# Patient Record
Sex: Female | Born: 1950
Health system: Southern US, Community
[De-identification: ages and names within clinical notes are randomized; demographics above are authoritative.]

## PROBLEM LIST (undated history)

## (undated) DIAGNOSIS — C9110 Chronic lymphocytic leukemia of B-cell type not having achieved remission: Secondary | ICD-10-CM

## (undated) DIAGNOSIS — K219 Gastro-esophageal reflux disease without esophagitis: Secondary | ICD-10-CM

## (undated) DIAGNOSIS — M1990 Unspecified osteoarthritis, unspecified site: Secondary | ICD-10-CM

## (undated) DIAGNOSIS — D0510 Intraductal carcinoma in situ of unspecified breast: Secondary | ICD-10-CM

## (undated) DIAGNOSIS — E871 Hypo-osmolality and hyponatremia: Secondary | ICD-10-CM

## (undated) HISTORY — DX: Intraductal carcinoma in situ of unspecified breast: D05.10

## (undated) HISTORY — DX: Unspecified osteoarthritis, unspecified site: M19.90

## (undated) HISTORY — PX: OTHER SURGICAL HISTORY: SHX169

## (undated) HISTORY — DX: Gastro-esophageal reflux disease without esophagitis: K21.9

## (undated) HISTORY — DX: Chronic lymphocytic leukemia of B-cell type not having achieved remission: C91.10

---

## 1994-05-19 HISTORY — PX: CHOLECYSTECTOMY: SHX55

## 2009-05-19 DIAGNOSIS — C9110 Chronic lymphocytic leukemia of B-cell type not having achieved remission: Secondary | ICD-10-CM

## 2009-05-19 HISTORY — DX: Chronic lymphocytic leukemia of B-cell type not having achieved remission: C91.10

## 2010-03-11 ENCOUNTER — Ambulatory Visit: Payer: Self-pay | Admitting: Sports Medicine

## 2010-03-19 ENCOUNTER — Ambulatory Visit: Payer: Self-pay | Admitting: Family Medicine

## 2010-04-18 ENCOUNTER — Ambulatory Visit: Payer: Self-pay | Admitting: Internal Medicine

## 2010-04-18 DIAGNOSIS — C9110 Chronic lymphocytic leukemia of B-cell type not having achieved remission: Secondary | ICD-10-CM | POA: Insufficient documentation

## 2010-05-19 ENCOUNTER — Ambulatory Visit: Payer: Self-pay | Admitting: Internal Medicine

## 2010-06-19 ENCOUNTER — Ambulatory Visit: Payer: Self-pay | Admitting: Internal Medicine

## 2010-07-19 ENCOUNTER — Ambulatory Visit: Payer: Self-pay | Admitting: Oncology

## 2010-08-18 ENCOUNTER — Ambulatory Visit: Payer: Self-pay | Admitting: Oncology

## 2010-10-11 ENCOUNTER — Ambulatory Visit: Payer: Self-pay | Admitting: Internal Medicine

## 2010-10-18 ENCOUNTER — Ambulatory Visit: Payer: Self-pay | Admitting: Internal Medicine

## 2010-11-17 ENCOUNTER — Ambulatory Visit: Payer: Self-pay | Admitting: Internal Medicine

## 2010-12-20 ENCOUNTER — Ambulatory Visit: Payer: Self-pay | Admitting: Internal Medicine

## 2011-01-18 ENCOUNTER — Ambulatory Visit: Payer: Self-pay | Admitting: Internal Medicine

## 2011-02-17 ENCOUNTER — Ambulatory Visit: Payer: Self-pay | Admitting: Internal Medicine

## 2011-05-02 ENCOUNTER — Ambulatory Visit: Payer: Self-pay | Admitting: Internal Medicine

## 2011-05-20 ENCOUNTER — Ambulatory Visit: Payer: Self-pay | Admitting: Internal Medicine

## 2011-05-20 HISTORY — PX: COLONOSCOPY: SHX174

## 2011-08-01 ENCOUNTER — Ambulatory Visit: Payer: Self-pay | Admitting: Internal Medicine

## 2011-08-18 ENCOUNTER — Ambulatory Visit: Payer: Self-pay | Admitting: Internal Medicine

## 2011-10-07 ENCOUNTER — Ambulatory Visit: Payer: Self-pay

## 2011-10-08 ENCOUNTER — Ambulatory Visit: Payer: Self-pay

## 2011-10-09 ENCOUNTER — Ambulatory Visit: Payer: Self-pay | Admitting: Internal Medicine

## 2011-10-18 ENCOUNTER — Ambulatory Visit: Payer: Self-pay | Admitting: Internal Medicine

## 2011-11-11 ENCOUNTER — Ambulatory Visit: Payer: Self-pay | Admitting: Gastroenterology

## 2011-11-12 LAB — PATHOLOGY REPORT

## 2012-04-26 ENCOUNTER — Ambulatory Visit: Payer: Self-pay | Admitting: Family Medicine

## 2012-06-24 ENCOUNTER — Ambulatory Visit: Payer: Self-pay | Admitting: Internal Medicine

## 2012-07-17 ENCOUNTER — Ambulatory Visit: Payer: Self-pay | Admitting: Internal Medicine

## 2012-12-29 ENCOUNTER — Ambulatory Visit: Payer: Self-pay | Admitting: Internal Medicine

## 2013-04-06 ENCOUNTER — Ambulatory Visit: Payer: Self-pay | Admitting: Internal Medicine

## 2013-04-11 LAB — OCCULT BLOOD X 1 CARD TO LAB, STOOL
Occult Blood, Feces: NEGATIVE
Occult Blood, Feces: NEGATIVE

## 2013-04-18 ENCOUNTER — Ambulatory Visit: Payer: Self-pay | Admitting: Internal Medicine

## 2013-06-28 ENCOUNTER — Ambulatory Visit: Payer: Self-pay | Admitting: Internal Medicine

## 2013-07-17 ENCOUNTER — Ambulatory Visit: Payer: Self-pay | Admitting: Internal Medicine

## 2013-09-23 ENCOUNTER — Ambulatory Visit: Payer: Self-pay | Admitting: Internal Medicine

## 2013-09-29 ENCOUNTER — Encounter: Payer: Self-pay | Admitting: General Surgery

## 2013-09-29 ENCOUNTER — Ambulatory Visit (INDEPENDENT_AMBULATORY_CARE_PROVIDER_SITE_OTHER): Payer: 59 | Admitting: General Surgery

## 2013-09-29 VITALS — BP 142/80 | HR 84 | Resp 14 | Ht 65.0 in | Wt 215.0 lb

## 2013-09-29 DIAGNOSIS — I872 Venous insufficiency (chronic) (peripheral): Secondary | ICD-10-CM

## 2013-09-29 DIAGNOSIS — I8310 Varicose veins of unspecified lower extremity with inflammation: Secondary | ICD-10-CM

## 2013-09-29 NOTE — Progress Notes (Signed)
Patient ID: Joanna Phillips, female   DOB: July 21, 1950, 63 y.o.   MRN: [id number]  Chief Complaint  Patient presents with  . Other    cellulitis right lower leg    HPI Joanna Phillips is a 63 y.o. female who presents for an evaluation of right lower leg redness. The patient states this started in September 2014 and has gotten worse. Pt noted a red area with a "knot" after a long bus trip. Pt denies pain but reports a burning sensation that started less than 1 month ago with a new "knot". The red area increased in size gradually. No prior treatments has been attempted. PCP and Oncology physician suggested to see a surgeon if it became worse. The patient states the area feels better after massaging the area.    HPI  Past Medical History  Diagnosis Date  . CLL (chronic lymphocytic leukemia) 2011  . GERD (gastroesophageal reflux disease)   . Arthritis     Past Surgical History  Procedure Laterality Date  . Cholecystectomy  1996    Family History  Problem Relation Age of Onset  . Cancer Father     lung  . Diabetes Mother     Social History History  Substance Use Topics  . Smoking status: Never Smoker   . Smokeless tobacco: Never Used  . Alcohol Use: No    Allergies  Allergen Reactions  . Amoxicillin Hives    Current Outpatient Prescriptions  Medication Sig Dispense Refill  . albuterol (PROVENTIL) (2.5 MG/3ML) 0.083% nebulizer solution Take 2.5 mg by nebulization every 6 (six) hours as needed for wheezing or shortness of breath.      . dextromethorphan-guaiFENesin (MUCINEX DM) 30-600 MG per 12 hr tablet Take 1 tablet by mouth 2 (two) times daily as needed for cough.      . fexofenadine (ALLEGRA) 180 MG tablet Take 180 mg by mouth daily.      . fluticasone (FLONASE) 50 MCG/ACT nasal spray Place into both nostrils daily.      Marland Kitchen guaiFENesin (ROBITUSSIN) 100 MG/5ML liquid Take 200 mg by mouth 3 (three) times daily as needed for cough.      Marland Kitchen ibuprofen (ADVIL,MOTRIN) 800 MG tablet  Take 800 mg by mouth every 8 (eight) hours as needed.       Marland Kitchen omeprazole (PRILOSEC) 20 MG capsule Take 20 mg by mouth daily.        No current facility-administered medications for this visit.    Review of Systems Review of Systems  Constitutional: Negative.   Respiratory: Negative.   Cardiovascular: Negative.     Blood pressure 142/80, pulse 84, resp. rate 14, height 5\' 5"  (1.651 m), weight 215 lb (97.523 kg).  Physical Exam Physical Exam  Constitutional: She is oriented to person, place, and time. She appears well-developed and well-nourished.  Eyes: Conjunctivae are normal. No scleral icterus.  Neck: Neck supple. No thyromegaly present.  Cardiovascular: Normal rate, regular rhythm and normal heart sounds.   No murmur heard. Pulses:      Dorsalis pedis pulses are 1+ on the right side, and 1+ on the left side.       Posterior tibial pulses are 2+ on the right side, and 2+ on the left side.  +2 edema on right leg. 1 + on left side.    Pulmonary/Chest: Effort normal and breath sounds normal.  Lymphadenopathy:    She has no cervical adenopathy.  Neurological: She is alert and oriented to person, place, and time.  Skin: Skin  is warm and dry.  Area of thickened patchy erythematous of skin with browning discoloration  on the medial aspect of her right lower leg that measures 4.5 inches long x 2.5 inches wide. Capillary refill is less than 2 seconds.   Multiple visible spider veins lower extremities bilaterally and varicosities at calves.      Data Reviewed None  Assessment    Venous stasis dermatitis right leg. Varicose veins in right calf and minimal on left calf.     Plan    Unna boot with compression. Prescription given for compression stockings 30-40 mm Hg. Unna boot to be changed weekly. I will reassess in 3 weeks with venous duplex study.        Joanna Phillips G Joanna Phillips 09/29/2013, 4:15 PM

## 2013-09-29 NOTE — Patient Instructions (Signed)
Patient to return weekly to have unna boot changed. She is also to return in 3 weeks for follow up with Dr. Jamal Collin. The patient is aware to call back for any questions or concerns.

## 2013-10-06 ENCOUNTER — Ambulatory Visit: Payer: 59

## 2013-10-13 ENCOUNTER — Ambulatory Visit (INDEPENDENT_AMBULATORY_CARE_PROVIDER_SITE_OTHER): Payer: 59 | Admitting: *Deleted

## 2013-10-13 DIAGNOSIS — I872 Venous insufficiency (chronic) (peripheral): Secondary | ICD-10-CM

## 2013-10-13 DIAGNOSIS — I8310 Varicose veins of unspecified lower extremity with inflammation: Secondary | ICD-10-CM

## 2013-10-13 NOTE — Progress Notes (Signed)
Area much improved to right lower leg. Follow up with MD next week.

## 2013-10-13 NOTE — Patient Instructions (Signed)
One week

## 2013-10-17 ENCOUNTER — Ambulatory Visit: Payer: Self-pay | Admitting: Internal Medicine

## 2013-10-19 ENCOUNTER — Other Ambulatory Visit: Payer: 59

## 2013-10-19 ENCOUNTER — Encounter: Payer: Self-pay | Admitting: General Surgery

## 2013-10-19 ENCOUNTER — Ambulatory Visit (INDEPENDENT_AMBULATORY_CARE_PROVIDER_SITE_OTHER): Payer: 59 | Admitting: General Surgery

## 2013-10-19 VITALS — BP 124/72 | HR 76 | Resp 12 | Ht 65.0 in | Wt 214.0 lb

## 2013-10-19 DIAGNOSIS — I872 Venous insufficiency (chronic) (peripheral): Secondary | ICD-10-CM

## 2013-10-19 NOTE — Progress Notes (Signed)
Patient ID: Joanna Phillips, female   DOB: Dec 05, 1950, 63 y.o.   MRN: [id number]  Chief Complaint  Patient presents with  . Follow-up    3 week follow up Duplex Study    HPI Joanna Phillips is a 63 y.o. female who presents for a 3 week follow up Duplex Study. The patient denies any pain in her legs at this time. She was treated with Ardelia Mems boot and compression with noteable improvement of the stasis dermatitis in right leg  HPI  Past Medical History  Diagnosis Date  . CLL (chronic lymphocytic leukemia) 2011  . GERD (gastroesophageal reflux disease)   . Arthritis     Past Surgical History  Procedure Laterality Date  . Cholecystectomy  1996    Family History  Problem Relation Age of Onset  . Cancer Father     lung  . Diabetes Mother     Social History History  Substance Use Topics  . Smoking status: Never Smoker   . Smokeless tobacco: Never Used  . Alcohol Use: No    Allergies  Allergen Reactions  . Amoxicillin Hives    Current Outpatient Prescriptions  Medication Sig Dispense Refill  . albuterol (PROVENTIL) (2.5 MG/3ML) 0.083% nebulizer solution Take 2.5 mg by nebulization every 6 (six) hours as needed for wheezing or shortness of breath.      . dextromethorphan-guaiFENesin (MUCINEX DM) 30-600 MG per 12 hr tablet Take 1 tablet by mouth 2 (two) times daily as needed for cough.      . fexofenadine (ALLEGRA) 180 MG tablet Take 180 mg by mouth daily.      . fluticasone (FLONASE) 50 MCG/ACT nasal spray Place into both nostrils daily.      Marland Kitchen guaiFENesin (ROBITUSSIN) 100 MG/5ML liquid Take 200 mg by mouth 3 (three) times daily as needed for cough.      Marland Kitchen ibuprofen (ADVIL,MOTRIN) 800 MG tablet Take 800 mg by mouth every 8 (eight) hours as needed.       Marland Kitchen omeprazole (PRILOSEC) 20 MG capsule Take 20 mg by mouth daily.        No current facility-administered medications for this visit.    Review of Systems Review of Systems  Constitutional: Negative.   Respiratory: Negative.    Cardiovascular: Negative.     Blood pressure 124/72, pulse 76, resp. rate 12, height 5\' 5"  (1.651 m), weight 214 lb (97.07 kg).  Physical Exam Physical Exam Right leg inner aspect with brawny induration, much improved from 3 weeks ago. Mild edema both legs. Minimal skin induration above left ankle. Data Reviewed Venous Duplex study done today-no DVT, no reflux in femoral vein or saphenous veins.  Assessment    Stasis dermatitis legs     Plan    Pt advised to start using compression hose-she already has an Rx for this. Proper use of this explained to her.         Seeplaputhur G Sankar 10/19/2013, 7:16 PM

## 2013-10-19 NOTE — Patient Instructions (Signed)
Patient advised to use compression hose on a daily basis. Also advised to try Udder Cream or Bag Balm which can be found at [facility]. Patient to return in 3 months for follow up. The patient is aware to call back for any questions or concerns.

## 2013-11-03 ENCOUNTER — Ambulatory Visit: Payer: Self-pay | Admitting: Internal Medicine

## 2013-12-21 ENCOUNTER — Ambulatory Visit: Payer: Self-pay | Admitting: Internal Medicine

## 2014-01-17 ENCOUNTER — Ambulatory Visit: Payer: Self-pay | Admitting: Internal Medicine

## 2014-01-17 ENCOUNTER — Ambulatory Visit: Payer: Self-pay | Admitting: General Surgery

## 2014-01-18 ENCOUNTER — Encounter: Payer: Self-pay | Admitting: General Surgery

## 2014-01-18 ENCOUNTER — Ambulatory Visit (INDEPENDENT_AMBULATORY_CARE_PROVIDER_SITE_OTHER): Payer: 59 | Admitting: General Surgery

## 2014-01-18 VITALS — BP 124/78 | HR 80 | Resp 14 | Ht 65.0 in | Wt 208.0 lb

## 2014-01-18 DIAGNOSIS — I872 Venous insufficiency (chronic) (peripheral): Secondary | ICD-10-CM

## 2014-01-18 NOTE — Patient Instructions (Signed)
Patient advised to continue wearing compression hose on a daily basis. Patient to return in 1 year for follow up. The patient is aware to call back for any questions or concerns.

## 2014-01-18 NOTE — Progress Notes (Signed)
Patient ID: Joanna Phillips, female   DOB: 24-Dec-1950, 63 y.o.   MRN: [id number]  Chief Complaint  Patient presents with  . Follow-up    3 month follow up venous insufficiency    HPI Joanna Phillips is a 63 y.o. female who presents for a 3 month follow up venous insufficiency. The patient states no new problems at this time. She is wearing her compression hose on a daily basis.   HPI  Past Medical History  Diagnosis Date  . CLL (chronic lymphocytic leukemia) 2011  . GERD (gastroesophageal reflux disease)   . Arthritis     Past Surgical History  Procedure Laterality Date  . Cholecystectomy  1996    Family History  Problem Relation Age of Onset  . Cancer Father     lung  . Diabetes Mother     Social History History  Substance Use Topics  . Smoking status: Never Smoker   . Smokeless tobacco: Never Used  . Alcohol Use: No    Allergies  Allergen Reactions  . Amoxicillin Hives    Current Outpatient Prescriptions  Medication Sig Dispense Refill  . albuterol (PROVENTIL) (2.5 MG/3ML) 0.083% nebulizer solution Take 2.5 mg by nebulization every 6 (six) hours as needed for wheezing or shortness of breath.      . Calcium Carbonate-Vitamin D (CALCIUM + D PO) Take 1 tablet by mouth daily.      Marland Kitchen dextromethorphan-guaiFENesin (MUCINEX DM) 30-600 MG per 12 hr tablet Take 1 tablet by mouth 2 (two) times daily as needed for cough.      . fluticasone (FLONASE) 50 MCG/ACT nasal spray Place into both nostrils daily.      Marland Kitchen omeprazole (PRILOSEC) 20 MG capsule Take 20 mg by mouth daily.        No current facility-administered medications for this visit.    Review of Systems Review of Systems  Constitutional: Negative.   Respiratory: Negative.   Cardiovascular: Negative.     Blood pressure 124/78, pulse 80, resp. rate 14, height 5\' 5"  (1.651 m), weight 208 lb (94.348 kg).  Physical Exam Physical Exam  Constitutional: She is oriented to person, place, and time. She appears  well-developed and well-nourished.  Cardiovascular:  Little edema present. Complete resolution of stasis changes in the right leg. Pulses all intact.   Neurological: She is alert and oriented to person, place, and time.  Skin: Skin is warm and dry.    Data Reviewed Prior notes and Dupklex study  Assessment    Stasis changes -much better now.     Plan    Continue use of compression hose. F/U in 1 yr        SANKAR,SEEPLAPUTHUR G 01/18/2014, 3:22 PM

## 2014-02-17 ENCOUNTER — Ambulatory Visit: Payer: Self-pay | Admitting: Internal Medicine

## 2014-03-03 LAB — OCCULT BLOOD X 1 CARD TO LAB, STOOL
OCCULT BLOOD, FECES: NEGATIVE
Occult Blood, Feces: NEGATIVE
Occult Blood, Feces: NEGATIVE

## 2014-03-19 ENCOUNTER — Ambulatory Visit: Payer: Self-pay | Admitting: Internal Medicine

## 2014-03-20 ENCOUNTER — Encounter: Payer: Self-pay | Admitting: General Surgery

## 2014-06-28 ENCOUNTER — Ambulatory Visit: Payer: Self-pay | Admitting: Internal Medicine

## 2014-07-18 ENCOUNTER — Ambulatory Visit
Admit: 2014-07-18 | Disposition: A | Discharge status: Home / Self Care | Payer: Self-pay | Attending: Internal Medicine | Admitting: Internal Medicine

## 2014-09-13 ENCOUNTER — Ambulatory Visit
Admit: 2014-09-13 | Disposition: A | Discharge status: Home / Self Care | Payer: Self-pay | Attending: Internal Medicine | Admitting: Internal Medicine

## 2014-12-15 ENCOUNTER — Inpatient Hospital Stay: Payer: 59 | Attending: Hematology and Oncology | Admitting: Hematology and Oncology

## 2014-12-15 VITALS — BP 155/76 | HR 72 | Temp 97.6°F | Resp 18 | Ht 65.0 in | Wt 195.0 lb

## 2014-12-15 DIAGNOSIS — Z79899 Other long term (current) drug therapy: Secondary | ICD-10-CM | POA: Diagnosis not present

## 2014-12-15 DIAGNOSIS — K219 Gastro-esophageal reflux disease without esophagitis: Secondary | ICD-10-CM | POA: Diagnosis not present

## 2014-12-15 DIAGNOSIS — M129 Arthropathy, unspecified: Secondary | ICD-10-CM | POA: Diagnosis not present

## 2014-12-15 DIAGNOSIS — D259 Leiomyoma of uterus, unspecified: Secondary | ICD-10-CM | POA: Diagnosis not present

## 2014-12-15 DIAGNOSIS — C9110 Chronic lymphocytic leukemia of B-cell type not having achieved remission: Secondary | ICD-10-CM | POA: Insufficient documentation

## 2014-12-15 NOTE — Progress Notes (Signed)
[facility] day:  12/15/2014  Chief Complaint: Joanna Phillips is a 64 y.o. female with chronic lymphocytic leukemia (CLL) who is seen for reassessment.  HPI:  The patient was initially seen in the medical oncology clinic on 04/18/2010.  She presented with mild lymphocytosis and small cervical adenopathy on routine physical exam.  CBC revealed only lymphocytosis.  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  SPEP was normal.  Chest, abdomen, and pelvic CT scan revealed small diffuse nodes.  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  Patient noted to have intermittent rectal bleeding in 06/2013.  Vaginal bleeding noted in 01/2014.  She was seen by gynecology at the [facility].  Pelvic exam and ultrasound revealed fibroids.  She had a colonoscopy in 10/2011. Stool guaiacs x 3 were negative in 06/2014.  She has had no further bleeding.  Notes indicate MCV has been low for years.  Patient has seen by Dr. Inez Pilgrim on 09/13/2014.  She was asymptomatic. Exam revealed a 4 mm right cervical node and a 2-3 mm low left neck node.  There was no organomegaly.  Labs on 09/08/2014 revealed a hemoglobin of 11.5, lymphocyte count of 12,400, uric acid 7.7, creatinine 0.89, and LDH 210.  Mammogram 12/27/2013 was negative.    She dstates that her diet consists of salads, meat, and vegetables.  She denies any melena or hematochezia.  She denies any vaginal bleeding.  [facility] labs from 12/11/2014 revealed a hematocrit of 35.1, hemoglobin 11.6, MCV 80, platelets 198,000, WBC 16,600 with an ANC of 2400.  absolutle lymphocyte count (ALC) was 12,700.  Uric acid was 6.1.  Creatinine was 0.83.  LDH was 236.  Symptomatically, she denies any B symptoms.  She denies any interval infections.  Past Medical History  Diagnosis Date  . CLL (chronic lymphocytic leukemia) 2011  . GERD  (gastroesophageal reflux disease)   . Arthritis     Past Surgical History  Procedure Laterality Date  . Cholecystectomy  1996    Family History  Problem Relation Age of Onset  . Cancer Father     lung  . Diabetes Mother     Social History:  reports that she has never smoked. She has never used smokeless tobacco. She reports that she does not drink alcohol or use illicit drugs.  The patient is accompanied by her husband, Joanna Phillips, today.  Allergies:  Allergies  Allergen Reactions  . Amoxicillin Hives    Current Medications: Current Outpatient Prescriptions  Medication Sig Dispense Refill  . Calcium Carbonate-Vitamin D (CALCIUM + D PO) Take 1 tablet by mouth daily.    Marland Kitchen dextromethorphan-guaiFENesin (MUCINEX DM) 30-600 MG per 12 hr tablet Take 1 tablet by mouth 2 (two) times daily as needed for cough.    . fluticasone (FLONASE) 50 MCG/ACT nasal spray Place into both nostrils daily.    Marland Kitchen omeprazole (PRILOSEC) 20 MG capsule Take 20 mg by mouth daily.      No current facility-administered medications for this visit.    Review of Systems:  GENERAL:  Feels good.  Active.  No fevers, sweats or weight loss. PERFORMANCE STATUS (ECOG):  0 HEENT:  No visual changes, runny nose, sore throat, mouth sores or tenderness. Lungs: No shortness of breath or cough.  No hemoptysis. Cardiac:  No chest pain, palpitations, orthopnea, or PND. GI:  No nausea, vomiting, diarrhea, constipation, melena or hematochezia.  GU:  No urgency, frequency, dysuria, or hematuria. Musculoskeletal:  No back pain.  No joint pain.  No muscle tenderness. Extremities:  No pain or swelling. Skin:  No rashes or skin changes. Neuro:  No headache, numbness or weakness, balance or coordination issues. Endocrine:  No diabetes, thyroid issues, hot flashes or night sweats. Psych:  No mood changes, depression or anxiety. Pain:  No focal pain. Review of systems:  All other systems reviewed and found to be negative.  Physical  Exam: Blood pressure 155/76, pulse 72, temperature 97.6 F (36.4 C), temperature source Tympanic, resp. rate 18, height 5\' 5"  (1.651 m), weight 195 lb (88.45 kg). GENERAL:  Well developed, well nourished, sitting comfortably in the exam room in no acute distress. MENTAL STATUS:  Alert and oriented to person, place and time. HEAD:  Styled brown hair.  Normocephalic, atraumatic, face symmetric, no Cushingoid features. EYES:  Glasses.  Blue eyes.  Pupils equal round and reactive to light and accomodation.  No conjunctivitis or scleral icterus. ENT:  Oropharynx clear without lesion.  Tongue normal. Mucous membranes moist.  NECK:  Tiny neck node. RESPIRATORY:  Clear to auscultation without rales, wheezes or rhonchi. CARDIOVASCULAR:  Regular rate and rhythm without murmur, rub or gallop. ABDOMEN:  Soft, non-tender, with active bowel sounds, and no hepatosplenomegaly.  No masses. SKIN:  No rashes, ulcers or lesions. EXTREMITIES: No edema, no skin discoloration or tenderness.  No palpable cords. LYMPH NODES: Tiny cervical node.  No palpable supraclavicular, axillary or inguinal adenopathy  NEUROLOGICAL: Unremarkable. PSYCH:  Appropriate.  No visits with results within 3 Day(s) from this visit. Latest known visit with results is:  [facility] on 02/17/2014  Component Date Value Ref Range Status  . Occult Blood, Feces 02/28/2014 NEGATIVE  NEGATIVE Final  . Occult Blood, Feces 03/01/2014 NEGATIVE  NEGATIVE Final  . Occult Blood, Feces 03/02/2014 NEGATIVE  NEGATIVE Final    Assessment:  Joanna Phillips is a 64 y.o. female with stage I chronic lymphocytic leukemia (CLL) diagnosed in 2011.  She presented with mild lymphocytosis and small cervical adenopathy on routine physical exam.  CBC revealed only lymphocytosis.  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  FISH studies on 05/27/2010 revealed trisomy 12 only and no mantle cell  lymphoma.  SPEP was normal.  Chest, abdomen, and pelvic CT scan revealed small diffuse nodes.  She has a history of intermittent rectal and vaginal bleeding in 2015.  She was seen by gynecology at the [facility].  Pelvic exam and ultrasound revealed fibroids.  Colonoscopy in 10/2011 was negative by report. Stool guaiacs x 3 were negative in 06/2014.  She has had no further bleeding.    Symptomatically, she denies any B symptoms.  She denies any infections.  CBC is stable.  Plan: 1.  Review entire medical history, diagnosis and management of CLL.  Discuss early stage disease with plans for ongoing observation.  Review indications for treatment. 2.  Discuss history of rectal and vaginal bleeding in 2015.  Discuss follow-up with gynecology and gastroenterology. 3.  Slip for [facility]: CBC with diff, BMP, uric acid, LDH 4.  RTC in 6 months for MD assess and review of [facility] labs.   Lequita Asal, MD  12/15/2014, 3:16 PM

## 2015-02-01 ENCOUNTER — Other Ambulatory Visit: Payer: Self-pay | Admitting: Family Medicine

## 2015-02-07 ENCOUNTER — Other Ambulatory Visit: Payer: Self-pay | Admitting: *Deleted

## 2015-02-07 NOTE — Telephone Encounter (Signed)
Patient requesting Ibuprofen 800 mg from [facility]. Last office visit 07/2014.

## 2015-02-13 ENCOUNTER — Ambulatory Visit: Payer: Self-pay | Admitting: General Surgery

## 2015-02-21 ENCOUNTER — Ambulatory Visit (INDEPENDENT_AMBULATORY_CARE_PROVIDER_SITE_OTHER): Payer: 59 | Admitting: General Surgery

## 2015-02-21 ENCOUNTER — Encounter: Payer: Self-pay | Admitting: General Surgery

## 2015-02-21 VITALS — BP 112/82 | HR 72 | Resp 14 | Ht 65.0 in | Wt 200.0 lb

## 2015-02-21 DIAGNOSIS — I872 Venous insufficiency (chronic) (peripheral): Secondary | ICD-10-CM | POA: Diagnosis not present

## 2015-02-21 NOTE — Progress Notes (Signed)
Patient ID: Joanna Phillips, female   DOB: 11/18/1950, 64 y.o.   MRN: [id number]  Chief Complaint  Patient presents with  . Follow-up    venous statis    HPI Joanna Phillips is a 64 y.o. female.  Here today for follow up venous insufficiency. The patient states no new problems at this time. She wears her compression hose on a daily basis, except for summer months. Occasional mild lower leg swelling but no pain.  HPI  Past Medical History  Diagnosis Date  . CLL (chronic lymphocytic leukemia) ([place]) 2011  . GERD (gastroesophageal reflux disease)   . Arthritis     Past Surgical History  Procedure Laterality Date  . Cholecystectomy  1996  . Colonoscopy  2013    Family History  Problem Relation Age of Onset  . Cancer Father     lung  . Diabetes Mother     Social History Social History  Substance Use Topics  . Smoking status: Never Smoker   . Smokeless tobacco: Never Used  . Alcohol Use: No    Allergies  Allergen Reactions  . Amoxicillin Hives    Current Outpatient Prescriptions  Medication Sig Dispense Refill  . Calcium Carbonate-Vitamin D (CALCIUM + D PO) Take 1 tablet by mouth daily.    Marland Kitchen dextromethorphan-guaiFENesin (MUCINEX DM) 30-600 MG per 12 hr tablet Take 1 tablet by mouth 2 (two) times daily as needed for cough.    . fluticasone (FLONASE) 50 MCG/ACT nasal spray Place into both nostrils as needed.     Marland Kitchen ibuprofen (ADVIL,MOTRIN) 800 MG tablet Take 1 tablet by mouth two  times daily as needed 180 tablet 0  . omeprazole (PRILOSEC) 20 MG capsule Take 20 mg by mouth daily.      No current facility-administered medications for this visit.    Review of Systems Review of Systems  Constitutional: Negative.   Respiratory: Negative.   Cardiovascular: Negative.   Little edema present.  stasis changes in the left leg  Blood pressure 112/82, pulse 72, resp. rate 14, height 5\' 5"  (1.651 m), weight 200 lb (90.719 kg).  Physical Exam Physical Exam  Constitutional: She  is oriented to person, place, and time. She appears well-developed and well-nourished.  Cardiovascular:  Pulses:      Dorsalis pedis pulses are 1+ on the right side, and 1+ on the left side.       Posterior tibial pulses are 2+ on the right side, and 2+ on the left side.  Moderate edema in both legs. Mild stasis discoloration mid left leg medially. No tenderness  Neurological: She is alert and oriented to person, place, and time.  Skin: Skin is warm and dry.    Data Reviewed Notes reviewed   Assessment    Venous insufficiency with stasis changes. No new symptoms    Plan    Continue use of compression hose. F/U in 1 yr     PCP:  Theodosia Quay 02/22/2015, 9:40 AM

## 2015-02-21 NOTE — Patient Instructions (Signed)
Continue use of compression hose. F/U in 1 yr

## 2015-02-22 ENCOUNTER — Encounter: Payer: Self-pay | Admitting: General Surgery

## 2015-05-20 DIAGNOSIS — Z923 Personal history of irradiation: Secondary | ICD-10-CM

## 2015-05-20 DIAGNOSIS — C50919 Malignant neoplasm of unspecified site of unspecified female breast: Secondary | ICD-10-CM

## 2015-05-20 HISTORY — DX: Malignant neoplasm of unspecified site of unspecified female breast: C50.919

## 2015-05-20 HISTORY — DX: Personal history of irradiation: Z92.3

## 2015-06-04 ENCOUNTER — Other Ambulatory Visit: Payer: Self-pay | Admitting: Family Medicine

## 2015-06-08 DIAGNOSIS — E790 Hyperuricemia without signs of inflammatory arthritis and tophaceous disease: Secondary | ICD-10-CM | POA: Insufficient documentation

## 2015-06-09 ENCOUNTER — Encounter: Payer: Self-pay | Admitting: Hematology and Oncology

## 2015-06-15 ENCOUNTER — Other Ambulatory Visit: Payer: Self-pay

## 2015-06-15 ENCOUNTER — Inpatient Hospital Stay: Payer: 59 | Attending: Hematology and Oncology | Admitting: Hematology and Oncology

## 2015-06-15 ENCOUNTER — Inpatient Hospital Stay: Payer: 59

## 2015-06-15 VITALS — BP 118/79 | HR 71 | Temp 98.6°F | Resp 18 | Ht 65.0 in | Wt 207.0 lb

## 2015-06-15 DIAGNOSIS — K449 Diaphragmatic hernia without obstruction or gangrene: Secondary | ICD-10-CM | POA: Diagnosis not present

## 2015-06-15 DIAGNOSIS — E790 Hyperuricemia without signs of inflammatory arthritis and tophaceous disease: Secondary | ICD-10-CM | POA: Insufficient documentation

## 2015-06-15 DIAGNOSIS — C9110 Chronic lymphocytic leukemia of B-cell type not having achieved remission: Secondary | ICD-10-CM | POA: Diagnosis not present

## 2015-06-15 DIAGNOSIS — K219 Gastro-esophageal reflux disease without esophagitis: Secondary | ICD-10-CM | POA: Diagnosis not present

## 2015-06-15 DIAGNOSIS — Z79899 Other long term (current) drug therapy: Secondary | ICD-10-CM | POA: Diagnosis not present

## 2015-06-15 DIAGNOSIS — D259 Leiomyoma of uterus, unspecified: Secondary | ICD-10-CM | POA: Diagnosis not present

## 2015-06-15 DIAGNOSIS — M129 Arthropathy, unspecified: Secondary | ICD-10-CM | POA: Insufficient documentation

## 2015-06-15 DIAGNOSIS — Z800 Family history of malignant neoplasm of digestive organs: Secondary | ICD-10-CM

## 2015-06-15 DIAGNOSIS — K5790 Diverticulosis of intestine, part unspecified, without perforation or abscess without bleeding: Secondary | ICD-10-CM | POA: Diagnosis not present

## 2015-06-15 DIAGNOSIS — Z87440 Personal history of urinary (tract) infections: Secondary | ICD-10-CM | POA: Insufficient documentation

## 2015-06-15 MED ORDER — ALLOPURINOL 300 MG PO TABS: 300.0000 mg | ORAL_TABLET | Freq: Every day | ORAL | Status: DC

## 2015-06-15 NOTE — Progress Notes (Signed)
[facility] day:  06/15/2015  Chief Complaint: Joanna Phillips is a 65 y.o. female with chronic lymphocytic leukemia (CLL) who is seen for 6 month assessment.  HPI:  The patient was last seen in the medical oncology clinic on 12/15/2014.  At that time, she was seen for initial assessment by me.  At that time, she denied any B symptoms.  She denied any infections.  CBC was stable.  We discussed ongoing plan for surveillance.  Given her history of rectal and vaginal bleeding in 2015, follow-up with gynecology and gastroenterology was recommended.  The patient has not had follow-up with GYN or GI.  She notes an EGD and colonoscopy on 11/11/2011.  EGD revealed a hiatal hernia and gastritis.  Colonoscopy revealed diverticulosis in the ascending, descending, and sigmoid colon.  She denies any melena or hematochezia.  She denies any vaginal bleeding.  Symptomatically, she denies any B symptoms.  She notes that the "knots" went away in her neck. She had 3 UTIs last year.  [facility] labs on 06/08/2015 revealed a hematocrit of 35.1, hemoglobin 11.7, MCV 81, platelets 225,000, WBC 17,200 with an ANC of 3100.  Absolutle lymphocyte count (ALC) was 12,200.  Uric acid was 8.0 (elevated; last check 7.7- elevated).  Creatinine was 0.90.  LDH was 217.  Past Medical History  Diagnosis Date  . CLL (chronic lymphocytic leukemia) ([place]) 2011  . GERD (gastroesophageal reflux disease)   . Arthritis     Past Surgical History  Procedure Laterality Date  . Cholecystectomy  1996  . Colonoscopy  2013    Family History  Problem Relation Age of Onset  . Cancer Father     lung  . Diabetes Mother     Social History:  reports that she has never smoked. She has never used smokeless tobacco. She reports that she does not drink alcohol or use illicit drugs.  The patient is accompanied by her husband, [name], today.  Allergies:  Allergies  Allergen Reactions  . Amoxicillin  Hives    Current Medications: Current Outpatient Prescriptions  Medication Sig Dispense Refill  . Calcium Carbonate-Vitamin D (CALCIUM + D PO) Take 1 tablet by mouth daily.    Marland Kitchen dextromethorphan-guaiFENesin (MUCINEX DM) 30-600 MG per 12 hr tablet Take 1 tablet by mouth 2 (two) times daily as needed for cough.    . fluticasone (FLONASE) 50 MCG/ACT nasal spray Place into both nostrils as needed.     Marland Kitchen ibuprofen (ADVIL,MOTRIN) 800 MG tablet Take 1 tablet by mouth two  times daily as needed 180 tablet 0  . omeprazole (PRILOSEC) 20 MG capsule Take 20 mg by mouth daily.      No current facility-administered medications for this visit.    Review of Systems:  GENERAL:  Feels good.  No fevers, sweats or weight loss. PERFORMANCE STATUS (ECOG):  0 HEENT:  No visual changes, runny nose, sore throat, mouth sores or tenderness. Lungs: No shortness of breath or cough.  No hemoptysis. Cardiac:  No chest pain, palpitations, orthopnea, or PND. GI:  No nausea, vomiting, diarrhea, constipation, melena or hematochezia. GU:  Three UTIs last year.  No urgency, frequency, dysuria, or hematuria.  No vaginal bleeding. Musculoskeletal:  No back pain.  No joint pain.  No muscle tenderness. Extremities:  No pain or swelling. Skin:  No rashes or skin changes. Neuro:  No headache, numbness or weakness, balance or coordination issues. Endocrine:  No diabetes, thyroid issues, hot flashes or night  sweats. Psych:  No mood changes, depression or anxiety. Pain:  No focal pain. Review of systems:  All other systems reviewed and found to be negative.  Physical Exam: Blood pressure 118/79, pulse 71, temperature 98.6 F (37 C), temperature source Tympanic, resp. rate 18, height 5\' 5"  (1.651 m), weight 207 lb 0.2 oz (93.9 kg). GENERAL:  Well developed, well nourished, sitting comfortably in the exam room in no acute distress. MENTAL STATUS:  Alert and oriented to person, place and time. HEAD:  Styled brown hair.   Normocephalic, atraumatic, face symmetric, no Cushingoid features. EYES:  Glasses.  Blue eyes.  Pupils equal round and reactive to light and accomodation.  No conjunctivitis or scleral icterus. ENT:  Oropharynx clear without lesion.  Tongue normal. Mucous membranes moist.  RESPIRATORY:  Clear to auscultation without rales, wheezes or rhonchi. CARDIOVASCULAR:  Regular rate and rhythm without murmur, rub or gallop. ABDOMEN:  Soft, non-tender, with active bowel sounds, and no hepatosplenomegaly.  No masses. SKIN:  No rashes, ulcers or lesions. EXTREMITIES: No edema, no skin discoloration or tenderness.  No palpable cords. LYMPH NODES:  No palpable cervical, supraclavicular, axillary or inguinal adenopathy  NEUROLOGICAL: Unremarkable. PSYCH:  Appropriate.  No visits with results within 3 Day(s) from this visit. Latest known visit with results is:  [facility] Conversion on 02/17/2014  Component Date Value Ref Range Status  . Occult Blood, Feces 02/28/2014 NEGATIVE  NEGATIVE Final  . Occult Blood, Feces 03/01/2014 NEGATIVE  NEGATIVE Final  . Occult Blood, Feces 03/02/2014 NEGATIVE  NEGATIVE Final    Assessment:  Joanna Phillips is a 65 y.o. female with stage I chronic lymphocytic leukemia (CLL) diagnosed in 2011.  She presented with mild lymphocytosis and small cervical adenopathy on routine physical exam.  CBC revealed only lymphocytosis.  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  SPEP was normal.  Chest, abdomen, and pelvic CT scan revealed small diffuse nodes.  She has a history of intermittent rectal and vaginal bleeding in 2015.  She was seen by [facility]necology at the [facility].  Pelvic exam and ultrasound revealed fibroids.   EGD on 11/11/2011 revealed a hiatal hernia and gastritis.  Colonoscopy on 11/11/2011 revealed diverticulosis in the ascending,  descending, and sigmoid colon.  Stool guaiacs x 3 were negative in 06/2014.  She denies any melena or hematochezia.  She denies any vaginal bleeding.    Symptomatically, she denies any B symptoms.  She has had 3 UTIs in 2015.  CBC is stable.  Uric acid is elevated.  Plan: 1.  Review [facility] labs.  Discuss slowly rising uric acid.  Discuss treatment with allopurinol. 2.  Review EGD and colonoscopy in 2013- done. 3.  Encourage patient to follow-up with gynecology at [facility]. 4.  Rx:  Allopurinol 300 mg a day. 5.  Slip for [facility] labs in 2 weeks: BMP, uric acid. 6.  Slip for [facility] labs in 6 months (CBC with diff, BMP, LDH, uric acid). 7.  RTC in 6 months for MD assessment and review of [facility] labs.   Lequita Asal, MD  06/15/2015, 4:12 PM

## 2015-06-16 ENCOUNTER — Encounter: Payer: Self-pay | Admitting: Hematology and Oncology

## 2015-06-19 ENCOUNTER — Telehealth: Payer: Self-pay | Admitting: Hematology and Oncology

## 2015-06-19 NOTE — Telephone Encounter (Signed)
Patient's husband called to see if someone from [facility] had called because they got a call "from this number" and did not know who was trying to reach them. If you have tried to call them, please call again and leave detailed vm. Thanks.

## 2015-06-28 ENCOUNTER — Ambulatory Visit (INDEPENDENT_AMBULATORY_CARE_PROVIDER_SITE_OTHER): Payer: 59 | Admitting: Family Medicine

## 2015-06-28 ENCOUNTER — Encounter: Payer: Self-pay | Admitting: Hematology and Oncology

## 2015-06-28 ENCOUNTER — Encounter: Payer: Self-pay | Admitting: Family Medicine

## 2015-06-28 VITALS — BP 110/70 | HR 96 | Temp 98.2°F | Resp 16 | Ht 65.0 in | Wt 206.8 lb

## 2015-06-28 DIAGNOSIS — J301 Allergic rhinitis due to pollen: Secondary | ICD-10-CM | POA: Diagnosis not present

## 2015-06-28 DIAGNOSIS — L270 Generalized skin eruption due to drugs and medicaments taken internally: Secondary | ICD-10-CM | POA: Diagnosis not present

## 2015-06-28 DIAGNOSIS — R7989 Other specified abnormal findings of blood chemistry: Secondary | ICD-10-CM

## 2015-06-28 DIAGNOSIS — H8110 Benign paroxysmal vertigo, unspecified ear: Secondary | ICD-10-CM | POA: Insufficient documentation

## 2015-06-28 DIAGNOSIS — R42 Dizziness and giddiness: Secondary | ICD-10-CM

## 2015-06-28 DIAGNOSIS — H8113 Benign paroxysmal vertigo, bilateral: Secondary | ICD-10-CM | POA: Diagnosis not present

## 2015-06-28 DIAGNOSIS — E790 Hyperuricemia without signs of inflammatory arthritis and tophaceous disease: Secondary | ICD-10-CM

## 2015-06-28 DIAGNOSIS — T50995A Adverse effect of other drugs, medicaments and biological substances, initial encounter: Secondary | ICD-10-CM

## 2015-06-28 MED ORDER — OXYMETAZOLINE HCL 0.05 % NA SOLN: 1.0000 | Freq: Two times a day (BID) | NASAL | Status: DC

## 2015-06-28 MED ORDER — DIPHENHYDRAMINE HCL 25 MG PO TABS: 25.0000 mg | ORAL_TABLET | Freq: Four times a day (QID) | ORAL | Status: DC | PRN

## 2015-06-28 MED ORDER — PREDNISONE 10 MG PO TABS
ORAL_TABLET | ORAL | Status: DC
Start: 2015-06-28 — End: 2015-10-11

## 2015-06-28 MED ORDER — MECLIZINE HCL 25 MG PO TABS: 25.0000 mg | ORAL_TABLET | Freq: Three times a day (TID) | ORAL | Status: DC | PRN

## 2015-06-28 MED ORDER — OLOPATADINE HCL 0.1 % OP SOLN: 1.0000 [drp] | Freq: Two times a day (BID) | OPHTHALMIC | Status: DC

## 2015-06-28 MED ORDER — LORATADINE 10 MG PO TABS: 10.0000 mg | ORAL_TABLET | Freq: Every day | ORAL | Status: DC

## 2015-06-28 NOTE — Assessment & Plan Note (Signed)
History of BPPV- given current sinus issues and ear fullness. I think balance issues are vertigo related. Continue flonase. Afrin for ear fullness. PRN meclizine.

## 2015-06-28 NOTE — Assessment & Plan Note (Signed)
Sinusitis due to allergies- no infection. Continue claritin and flonase. Add patanol for itchy eyes.

## 2015-06-28 NOTE — Progress Notes (Signed)
Subjective:    Patient ID: Joanna Phillips, female    DOB: 17-Sep-1950, 65 y.o.   MRN: [id number]  HPI: Joanna Phillips is a 65 y.o. female presenting on 06/28/2015 for Rash   HPI   Pt presents with sinus pressure/headaches. Generalized headache started Monday. Has been taking ibuprofen about twice per day, which has helped. Diaphoretic this morning while getting dressed. No chills, but felt cool yesterday. Husband said she felt warm last night. Bilateral ears feel full, but not painful. No rhinorrhea. Post nasal drip, but no throat pain. Balance seems off. No dizziness. Feels unsteady. She has a history of vertigo.  Pt also reporting a rash. Eyes itchy and redness around the eyes. No visual changes.  No sick contacts. Doesn't feel like anything is in eye. No visual changes. Rash started last night on arms, legs, and chest, which is slightly pruritic. No new detergents or pets. New medication from oncologist for high uric acid: started allopurinol 2  Weeks ago.. Very fatigued since Monday. Came on quickly.    Past Medical History  Diagnosis Date  . CLL (chronic lymphocytic leukemia) ([place]) 2011  . GERD (gastroesophageal reflux disease)   . Arthritis     Current Outpatient Prescriptions on File Prior to Visit  Medication Sig  . Calcium Carbonate-Vitamin D (CALCIUM + D PO) Take 1 tablet by mouth daily.  . fluticasone (FLONASE) 50 MCG/ACT nasal spray Place into both nostrils as needed.   Marland Kitchen ibuprofen (ADVIL,MOTRIN) 800 MG tablet Take 1 tablet by mouth two  times daily as needed  . omeprazole (PRILOSEC) 20 MG capsule Take 20 mg by mouth daily.    No current facility-administered medications on file prior to visit.    Review of Systems  Constitutional: Positive for diaphoresis, activity change and fatigue. Negative for fever and chills.  HENT: Positive for ear pain, postnasal drip and sinus pressure. Negative for hearing loss, rhinorrhea and sore throat.   Eyes: Positive for redness and  itching (right). Negative for pain, discharge and visual disturbance.  Respiratory: Negative for cough, chest tightness and shortness of breath.   Cardiovascular: Negative for chest pain.  Gastrointestinal: Negative for nausea, vomiting, abdominal pain and diarrhea.  Musculoskeletal: Negative for myalgias, back pain and arthralgias.  Skin: Positive for rash.  Allergic/Immunologic: Negative for environmental allergies and food allergies.  Neurological: Positive for dizziness, light-headedness and headaches. Negative for weakness.  Hematological: Negative for adenopathy (recent appointment with oncologist).  Psychiatric/Behavioral: Negative for sleep disturbance and agitation. The patient is not nervous/anxious.    Per HPI unless specifically indicated above     Objective:    BP 110/70 mmHg  Pulse 96  Temp(Src) 98.2 F (36.8 C) (Oral)  Resp 16  Ht 5\' 5"  (1.651 m)  Wt 206 lb 12.8 oz (93.804 kg)  BMI 34.41 kg/m2  Wt Readings from Last 3 Encounters:  06/28/15 206 lb 12.8 oz (93.804 kg)  06/15/15 207 lb 0.2 oz (93.9 kg)  02/21/15 200 lb (90.719 kg)    Physical Exam  Constitutional: She is oriented to person, place, and time. She appears well-developed.  HENT:  Head: Normocephalic.  Right Ear: Hearing normal. No drainage or swelling. A middle ear effusion is present. No decreased hearing is noted.  Left Ear: Hearing normal. No drainage or swelling. Tympanic membrane is scarred. A middle ear effusion is present. No decreased hearing is noted.  Nose: No rhinorrhea (erhythema). Right sinus exhibits no maxillary sinus tenderness and no frontal sinus tenderness. Left sinus  exhibits no maxillary sinus tenderness and no frontal sinus tenderness.  Mouth/Throat: Oropharynx is clear and moist and mucous membranes are normal. No oropharyngeal exudate, posterior oropharyngeal edema or posterior oropharyngeal erythema.  Eyes: Right eye exhibits no discharge and no exudate. No foreign body present in  the right eye. Left eye exhibits no discharge and no exudate. No foreign body present in the left eye. Right eye exhibits nystagmus. Left eye exhibits nystagmus.  Swollen and pink around right eye. No foreign body sensation. No visual changes. Conjunctiva white. No discharge.  Cardiovascular: Normal rate, regular rhythm, S1 normal, S2 normal and normal pulses.  Exam reveals no gallop.   No murmur heard. Pulmonary/Chest: Effort normal and breath sounds normal. No accessory muscle usage. No respiratory distress.  Lymphadenopathy:       Head (right side): No submental, no submandibular, no tonsillar, no preauricular, no posterior auricular and no occipital adenopathy present.       Head (left side): No submental, no submandibular, no tonsillar, no preauricular, no posterior auricular and no occipital adenopathy present.    She has no cervical adenopathy.  Neurological: She is alert and oriented to person, place, and time. She has normal strength. She displays a negative Romberg sign. Gait abnormal.  Cerebella fine motor movements intact.   Skin: Skin is warm, dry and intact. Rash noted. Rash is papular and urticarial. She is not diaphoretic. No pallor.  Scattered papular rash over chest, back, bilateral upper legs, and bilateral arms.  Psychiatric: She has a normal mood and affect. Her speech is normal and behavior is normal. Judgment and thought content normal. Cognition and memory are normal.   Results for orders placed or performed in visit on 02/17/14  Occult blood card to lab, stool  Result Value Ref Range   Occult Blood, Feces NEGATIVE NEGATIVE  Occult blood card to lab, stool  Result Value Ref Range   Occult Blood, Feces NEGATIVE NEGATIVE  Occult blood card to lab, stool  Result Value Ref Range   Occult Blood, Feces NEGATIVE NEGATIVE      Assessment & Plan:   Problem List Items Addressed This Visit      Respiratory   Allergic rhinitis due to pollen    Sinusitis due to allergies-  no infection. Continue claritin and flonase. Add patanol for itchy eyes.       Relevant Medications   olopatadine (PATANOL) 0.1 % ophthalmic solution   Other Relevant Orders   CBC with Differential/Platelet     Nervous and Auditory   BPPV (benign paroxysmal positional vertigo)    History of BPPV- given current sinus issues and ear fullness. I think balance issues are vertigo related. Continue flonase. Afrin for ear fullness. PRN meclizine.       Relevant Medications   oxymetazoline (AFRIN NASAL SPRAY) 0.05 % nasal spray   meclizine (ANTIVERT) 25 MG tablet     Other   Elevated uric acid in blood    STOP allopurinol due to rash. Likely a drug reaction. Recheck uric acid levels, consider uloric.       Relevant Orders   Uric acid    Other Visit Diagnoses    Allergic drug rash    -  Primary    Rash is likley from allopurinol. STOP. treat with prednisone for itching and claritin. Return for recheck 2 weeks.     Relevant Medications    predniSONE (DELTASONE) 10 MG tablet    loratadine (CLARITIN) 10 MG tablet    diphenhydrAMINE (BENADRYL)  25 MG tablet    Other Relevant Orders    Comprehensive metabolic panel    Dizziness        Check CBC. TSH.     Relevant Orders    Comprehensive metabolic panel    TSH       Meds ordered this encounter  Medications  . predniSONE (DELTASONE) 10 MG tablet    Sig: 6 pills today, 5 pills day 2, 4 pills day 3, 3 pills day 4, 2 pills day 5, 1 pill day 6.    Dispense:  21 tablet    Refill:  0    Order Specific Question:  Supervising Provider    Answer:  Arlis Porta [phone]  . loratadine (CLARITIN) 10 MG tablet    Sig: Take 1 tablet (10 mg total) by mouth daily.    Dispense:  30 tablet    Refill:  11    Order Specific Question:  Supervising Provider    Answer:  Arlis Porta [phone]  . diphenhydrAMINE (BENADRYL) 25 MG tablet    Sig: Take 1 tablet (25 mg total) by mouth every 6 (six) hours as needed.    Dispense:  30 tablet     Refill:  0    Order Specific Question:  Supervising Provider    Answer:  Arlis Porta [phone]  . oxymetazoline (AFRIN NASAL SPRAY) 0.05 % nasal spray    Sig: Place 1 spray into both nostrils 2 (two) times daily.    Dispense:  30 mL    Refill:  0    Order Specific Question:  Supervising Provider    Answer:  Arlis Porta [id number]  . olopatadine (PATANOL) 0.1 % ophthalmic solution    Sig: Place 1 drop into both eyes 2 (two) times daily.    Dispense:  5 mL    Refill:  12    Order Specific Question:  Supervising Provider    Answer:  Arlis Porta [phone]  . meclizine (ANTIVERT) 25 MG tablet    Sig: Take 1 tablet (25 mg total) by mouth 3 (three) times daily as needed for dizziness.    Dispense:  30 tablet    Refill:  0    Order Specific Question:  Supervising Provider    Answer:  Arlis Porta [id number]      Follow up plan: Return in about 2 weeks (around 07/12/2015) for rash. Marland Kitchen

## 2015-06-28 NOTE — Assessment & Plan Note (Signed)
STOP allopurinol due to rash. Likely a drug reaction. Recheck uric acid levels, consider uloric.

## 2015-06-28 NOTE — Patient Instructions (Signed)
STOP your allopurinol.  I think this is a drug reaction. Let's try prednisone, claritin, benadryl for your itching.  I think the dizziness might be related to the fluid in your ears. Try afrin at bedtime- 1 spray per nostril and turn your head to the side and allow to trickle into the ear.   We will check some labwork to make sure nothing is going on.   IF you develop a fever, rash is getting worse, chest pain, shortness of breath, or other concerning symptoms, please go to ER or urgent care.

## 2015-06-29 ENCOUNTER — Telehealth: Payer: Self-pay | Admitting: Family Medicine

## 2015-06-29 DIAGNOSIS — R7989 Other specified abnormal findings of blood chemistry: Secondary | ICD-10-CM

## 2015-06-29 LAB — CBC WITH DIFFERENTIAL/PLATELET
BASOS ABS: 0 10*3/uL (ref 0.0–0.2)
Basos: 0 %
EOS (ABSOLUTE): 0.4 10*3/uL (ref 0.0–0.4)
EOS: 3 %
Hematocrit: 37.5 % (ref 34.0–46.6)
Hemoglobin: 12.5 g/dL (ref 11.1–15.9)
IMMATURE GRANS (ABS): 0 10*3/uL (ref 0.0–0.1)
IMMATURE GRANULOCYTES: 0 %
LYMPHS ABS: 7.1 10*3/uL — AB (ref 0.7–3.1)
LYMPHS: 52 %
MCH: 26.6 pg (ref 26.6–33.0)
MCHC: 33.3 g/dL (ref 31.5–35.7)
MCV: 80 fL (ref 79–97)
MONOCYTES: 11 %
Monocytes Absolute: 1.5 10*3/uL — ABNORMAL HIGH (ref 0.1–0.9)
NEUTROS PCT: 34 %
Neutrophils Absolute: 4.6 10*3/uL (ref 1.4–7.0)
Platelets: 158 10*3/uL (ref 150–379)
RBC: 4.7 x10E6/uL (ref 3.77–5.28)
RDW: 16 % — AB (ref 12.3–15.4)
WBC: 13.6 10*3/uL — AB (ref 3.4–10.8)

## 2015-06-29 LAB — COMPREHENSIVE METABOLIC PANEL
ALT: 14 IU/L (ref 0–32)
AST: 17 IU/L (ref 0–40)
Albumin/Globulin Ratio: 0.9 — ABNORMAL LOW (ref 1.1–2.5)
Albumin: 3.8 g/dL (ref 3.6–4.8)
Alkaline Phosphatase: 67 IU/L (ref 39–117)
BUN/Creatinine Ratio: 18 (ref 11–26)
BUN: 21 mg/dL (ref 8–27)
Bilirubin Total: 0.5 mg/dL (ref 0.0–1.2)
CALCIUM: 8.8 mg/dL (ref 8.7–10.3)
CO2: 23 mmol/L (ref 18–29)
CREATININE: 1.16 mg/dL — AB (ref 0.57–1.00)
Chloride: 95 mmol/L — ABNORMAL LOW (ref 96–106)
GFR, EST AFRICAN AMERICAN: 57 mL/min/{1.73_m2} — AB (ref >59)
GFR, EST NON AFRICAN AMERICAN: 50 mL/min/{1.73_m2} — AB (ref >59)
Globulin, Total: 4.3 g/dL (ref 1.5–4.5)
Glucose: 130 mg/dL — ABNORMAL HIGH (ref 65–99)
Potassium: 4.2 mmol/L (ref 3.5–5.2)
Sodium: 132 mmol/L — ABNORMAL LOW (ref 134–144)
TOTAL PROTEIN: 8.1 g/dL (ref 6.0–8.5)

## 2015-06-29 LAB — TSH: TSH: 8.12 u[IU]/mL — AB (ref 0.450–4.500)

## 2015-06-29 LAB — URIC ACID: Uric Acid: 3.6 mg/dL (ref 2.5–7.1)

## 2015-06-29 NOTE — Telephone Encounter (Signed)
Called [facility] to add on T4 and T3 for elevated TSH level.

## 2015-06-29 NOTE — Telephone Encounter (Signed)
-----   Message from Lequita Asal, MD sent at 06/29/2015  9:21 AM EST -----  Thanks for the information.    Besides the rash, how is she doing?  Melissa  ----- Message -----    From: Luciana Axe, NP    Sent: 06/29/2015   9:16 AM      To: Lequita Asal, MD  I saw her in the office for an acute visit and did a few labs, so I added yours on. Please see your CBC and uric acid level.  I also stopped her allopurinol yesterday due to rash. Thanks! Fredia Sorrow, FNP   ----- Message -----    From: [facility] Lab Results In Interface    Sent: 06/29/2015   5:41 AM      To: Luciana Axe, NP

## 2015-06-29 NOTE — Telephone Encounter (Signed)
Reviewed labs with patient. T4,T3 pending. Will start medication when lab results return. She is feeling better today. Prednisone helped with her rash and itching. Afrin is helping with her ears.  Sodium levels are low, recheck next visit.  Sugar was high but pt had eaten prior to exam.  Pt will follow-up in 2-3 weeks.

## 2015-07-01 LAB — SPECIMEN STATUS REPORT

## 2015-07-01 LAB — T4, FREE: Free T4: 1.1 ng/dL (ref 0.82–1.77)

## 2015-07-01 LAB — T3: T3, Total: 64 ng/dL — ABNORMAL LOW (ref 71–180)

## 2015-07-02 ENCOUNTER — Telehealth: Payer: Self-pay | Admitting: Family Medicine

## 2015-07-02 NOTE — Telephone Encounter (Signed)
Called pt to review add-on thyroid panel.  T3 is low but T4 is normal. She might have a subclinical hypothyroidism. Would like to review symptoms with her. Possible referral to endocrine.

## 2015-07-03 ENCOUNTER — Telehealth: Payer: Self-pay | Admitting: Family Medicine

## 2015-07-03 NOTE — Telephone Encounter (Signed)
Pt returned your call.  Please try her cell phone [phone]

## 2015-07-03 NOTE — Telephone Encounter (Signed)
Pt called for test result.

## 2015-07-05 NOTE — Telephone Encounter (Signed)
Spoke with patient. T4 is normal and T3 is low. She would like to wait on endocrine consult and recheck in 3 mos. Pt will make follow-up appt.

## 2015-07-14 ENCOUNTER — Other Ambulatory Visit: Payer: Self-pay | Admitting: Family Medicine

## 2015-08-20 ENCOUNTER — Other Ambulatory Visit: Payer: Self-pay | Admitting: Family Medicine

## 2015-10-11 ENCOUNTER — Encounter: Payer: Self-pay | Admitting: Family Medicine

## 2015-10-11 ENCOUNTER — Ambulatory Visit (INDEPENDENT_AMBULATORY_CARE_PROVIDER_SITE_OTHER): Payer: 59 | Admitting: Family Medicine

## 2015-10-11 VITALS — BP 113/73 | HR 85 | Temp 97.7°F | Resp 16 | Ht 65.0 in | Wt 210.0 lb

## 2015-10-11 DIAGNOSIS — R7989 Other specified abnormal findings of blood chemistry: Secondary | ICD-10-CM

## 2015-10-11 DIAGNOSIS — R058 Other specified cough: Secondary | ICD-10-CM

## 2015-10-11 DIAGNOSIS — E790 Hyperuricemia without signs of inflammatory arthritis and tophaceous disease: Secondary | ICD-10-CM

## 2015-10-11 DIAGNOSIS — R05 Cough: Secondary | ICD-10-CM

## 2015-10-11 DIAGNOSIS — E038 Other specified hypothyroidism: Secondary | ICD-10-CM

## 2015-10-11 DIAGNOSIS — N182 Chronic kidney disease, stage 2 (mild): Secondary | ICD-10-CM

## 2015-10-11 DIAGNOSIS — E039 Hypothyroidism, unspecified: Secondary | ICD-10-CM | POA: Insufficient documentation

## 2015-10-11 DIAGNOSIS — B001 Herpesviral vesicular dermatitis: Secondary | ICD-10-CM | POA: Diagnosis not present

## 2015-10-11 MED ORDER — VALACYCLOVIR HCL 1 G PO TABS: 2000.0000 mg | ORAL_TABLET | Freq: Two times a day (BID) | ORAL | Status: DC

## 2015-10-11 NOTE — Progress Notes (Signed)
Subjective:    Patient ID: Joanna Phillips, female    DOB: 1950/12/08, 65 y.o.   MRN: [id number]  HPI: Joanna Phillips is a 65 y.o. female presenting on 10/11/2015 for Hypothyroidism   HPI  Patient here for follow up on Thyroid: Overall felt well until approximately 2 weeks ago...diagnosed with a URI last week, seen at [facility]: prescribed, Levaquin and hydrocodone syrup(became itchy and stopped taking). Lasted about 1 week but feels like she still has drainage and is very fatigued but overall improved. Intermittent chest tightness when cough was present. Denies fever, night sweats, shortness of breath, dizziness and chills. Lots of drainage. No current chest tightness.  Elevated uric: Was treated for elevated uric acid- had reaction to allopurino. Denies any joint pain. Last uric acid was normal. Fever blister- Started approximately 2 weeks ago as she was getting sick. Had one this bad only 1 other time.   Past Medical History  Diagnosis Date  . CLL (chronic lymphocytic leukemia) ([place]) 2011  . GERD (gastroesophageal reflux disease)   . Arthritis     Current Outpatient Prescriptions on File Prior to Visit  Medication Sig  . Calcium Carbonate-Vitamin D (CALCIUM + D PO) Take 1 tablet by mouth daily.  . diphenhydrAMINE (BENADRYL) 25 MG tablet Take 1 tablet (25 mg total) by mouth every 6 (six) hours as needed.  . fluticasone (FLONASE) 50 MCG/ACT nasal spray Place into both nostrils as needed.   Marland Kitchen ibuprofen (ADVIL,MOTRIN) 800 MG tablet Take 1 tablet by mouth two  times daily as needed  . loratadine (CLARITIN) 10 MG tablet Take 1 tablet (10 mg total) by mouth daily.  . meclizine (ANTIVERT) 25 MG tablet TAKE 1 TABLET (25 MG TOTAL) BY MOUTH 3 (THREE) TIMES DAILY AS NEEDED FOR DIZZINESS.  Marland Kitchen omeprazole (PRILOSEC) 20 MG capsule Take 1 capsule by mouth two times daily   No current facility-administered medications on file prior to visit.    Review of Systems  Constitutional:  Negative for fever and chills.  HENT: Negative.        Cold sore.   Respiratory: Positive for cough (resolving. ). Negative for chest tightness and wheezing.   Cardiovascular: Negative for chest pain and leg swelling.  Gastrointestinal: Negative for nausea, vomiting, abdominal pain, diarrhea and constipation.  Endocrine: Negative.  Negative for cold intolerance, heat intolerance, polydipsia, polyphagia and polyuria.  Genitourinary: Negative for dysuria and difficulty urinating.  Musculoskeletal: Negative.   Neurological: Negative for dizziness, light-headedness and numbness.  Psychiatric/Behavioral: Negative.    Per HPI unless specifically indicated above     Objective:    BP 113/73 mmHg  Pulse 85  Temp(Src) 97.7 F (36.5 C) (Oral)  Resp 16  Ht 5\' 5"  (1.651 m)  Wt 210 lb (95.255 kg)  BMI 34.95 kg/m2  Wt Readings from Last 3 Encounters:  10/11/15 210 lb (95.255 kg)  06/28/15 206 lb 12.8 oz (93.804 kg)  06/15/15 207 lb 0.2 oz (93.9 kg)    Physical Exam  Constitutional: She is oriented to person, place, and time. She appears well-developed and well-nourished.  HENT:  Head: Normocephalic and atraumatic.  Right Ear: Hearing and tympanic membrane normal.  Left Ear: Hearing and tympanic membrane normal.  Nose: Mucosal edema and rhinorrhea present. Right sinus exhibits no maxillary sinus tenderness and no frontal sinus tenderness. Left sinus exhibits no maxillary sinus tenderness and no frontal sinus tenderness.  Mouth/Throat: Uvula is midline, oropharynx is clear and moist and mucous membranes are normal.  Erythematous lesion with vesicle on lower lip.  Drainage seen in oropharynx.   Neck: Normal range of motion. Neck supple. No thyromegaly present.  Cardiovascular: Normal rate, regular rhythm and normal heart sounds.  Exam reveals no gallop and no friction rub.   No murmur heard. Pulmonary/Chest: Effort normal and breath sounds normal. She has no wheezes. She exhibits no  tenderness.  Abdominal: Soft. Normal appearance and bowel sounds are normal. She exhibits no distension and no mass. There is no tenderness. There is no rebound and no guarding.  Musculoskeletal: Normal range of motion. She exhibits no edema or tenderness.  Lymphadenopathy:    She has no cervical adenopathy.  Neurological: She is alert and oriented to person, place, and time.  Skin: Skin is warm and dry.   Results for orders placed or performed in visit on 06/28/15  Comprehensive metabolic panel  Result Value Ref Range   Glucose 130 (H) 65 - 99 mg/dL   BUN 21 8 - 27 mg/dL   Creatinine, Ser 1.16 (H) 0.57 - 1.00 mg/dL   GFR calc non Af Amer 50 (L) >59 mL/min/1.73   GFR calc Af Amer 57 (L) >59 mL/min/1.73   BUN/Creatinine Ratio 18 11 - 26   Sodium 132 (L) 134 - 144 mmol/L   Potassium 4.2 3.5 - 5.2 mmol/L   Chloride 95 (L) 96 - 106 mmol/L   CO2 23 18 - 29 mmol/L   Calcium 8.8 8.7 - 10.3 mg/dL   Total Protein 8.1 6.0 - 8.5 g/dL   Albumin 3.8 3.6 - 4.8 g/dL   Globulin, Total 4.3 1.5 - 4.5 g/dL   Albumin/Globulin Ratio 0.9 (L) 1.1 - 2.5   Bilirubin Total 0.5 0.0 - 1.2 mg/dL   Alkaline Phosphatase 67 39 - 117 IU/L   AST 17 0 - 40 IU/L   ALT 14 0 - 32 IU/L  Uric acid  Result Value Ref Range   Uric Acid 3.6 2.5 - 7.1 mg/dL  TSH  Result Value Ref Range   TSH 8.120 (H) 0.450 - 4.500 uIU/mL  CBC with Differential/Platelet  Result Value Ref Range   WBC 13.6 (H) 3.4 - 10.8 x10E3/uL   RBC 4.70 3.77 - 5.28 x10E6/uL   Hemoglobin 12.5 11.1 - 15.9 g/dL   Hematocrit 37.5 34.0 - 46.6 %   MCV 80 79 - 97 fL   MCH 26.6 26.6 - 33.0 pg   MCHC 33.3 31.5 - 35.7 g/dL   RDW 16.0 (H) 12.3 - 15.4 %   Platelets 158 150 - 379 x10E3/uL   Neutrophils 34 %   Lymphs 52 %   Monocytes 11 %   Eos 3 %   Basos 0 %   Neutrophils Absolute 4.6 1.4 - 7.0 x10E3/uL   Lymphocytes Absolute 7.1 (H) 0.7 - 3.1 x10E3/uL   Monocytes Absolute 1.5 (H) 0.1 - 0.9 x10E3/uL   EOS (ABSOLUTE) 0.4 0.0 - 0.4 x10E3/uL    Basophils Absolute 0.0 0.0 - 0.2 x10E3/uL   Immature Granulocytes 0 %   Immature Grans (Abs) 0.0 0.0 - 0.1 x10E3/uL   Hematology Comments: Note:   T4, free  Result Value Ref Range   Free T4 1.10 0.82 - 1.77 ng/dL  T3  Result Value Ref Range   T3, Total 64 (L) 71 - 180 ng/dL  Specimen status report  Result Value Ref Range   specimen status report Comment       Assessment & Plan:   Problem List Items Addressed This Visit  Digestive   Recurrent cold sores    Start valacyclovir 2g BID for 1 days to help with cold sore. Refills for recurrence.       Relevant Medications   valACYclovir (VALTREX) 1000 MG tablet     Endocrine   Subclinical hypothyroidism - Primary    Recheck TSH , T3, and t4. Start therapy if abnormal. Consider endocrine referral if continued.       Relevant Orders   TSH   T3, free   T4, free     Other   Elevated uric acid in blood    Normal at last check.       Other Visit Diagnoses    CKD (chronic kidney disease), stage 2 (mild)        Relevant Orders    Basic Metabolic Panel (BMET)    Post-viral cough syndrome        Lungs clear. Continue supportive care at home. Alarm symptoms reviewed. Return if not improving.        Meds ordered this encounter  Medications  . DISCONTD: Calcium Carb-Cholecalciferol [phone] MG-UNIT TABS    Sig: Take 1 capsule by mouth daily.  Marland Kitchen DISCONTD: levofloxacin (LEVAQUIN) 500 MG tablet    Sig: Take 500 mg by mouth daily.  Marland Kitchen DISCONTD: HYDROcodone-homatropine (HYCODAN) 5-1.5 MG/5ML syrup    Sig: Take 5 mLs by mouth every 6 (six) hours as needed.  . valACYclovir (VALTREX) 1000 MG tablet    Sig: Take 2 tablets (2,000 mg total) by mouth 2 (two) times daily. For 1 day.    Dispense:  10 tablet    Refill:  11    Order Specific Question:  Supervising Provider    Answer:  Arlis Porta [phone]      Follow up plan: Return in about 6 months (around 04/12/2016), or if symptoms worsen or fail to improve.

## 2015-10-11 NOTE — Assessment & Plan Note (Signed)
Recheck TSH , T3, and t4. Start therapy if abnormal. Consider endocrine referral if continued.

## 2015-10-11 NOTE — Assessment & Plan Note (Signed)
Start valacyclovir 2g BID for 1 days to help with cold sore. Refills for recurrence.

## 2015-10-11 NOTE — Patient Instructions (Signed)
Please get labwork done at [facility] anytime. We will check your thyroid function to determine if you need medication.   Try taking the valtrex to see if we can get that cold sore to go away.

## 2015-10-11 NOTE — Assessment & Plan Note (Signed)
Normal at last check.

## 2015-10-13 LAB — BASIC METABOLIC PANEL
BUN/Creatinine Ratio: 23 (ref 12–28)
BUN: 21 mg/dL (ref 8–27)
CO2: 22 mmol/L (ref 18–29)
CREATININE: 0.9 mg/dL (ref 0.57–1.00)
Calcium: 8.7 mg/dL (ref 8.7–10.3)
Chloride: 104 mmol/L (ref 96–106)
GFR calc Af Amer: 78 mL/min/{1.73_m2} (ref >59)
GFR, EST NON AFRICAN AMERICAN: 67 mL/min/{1.73_m2} (ref >59)
GLUCOSE: 116 mg/dL — AB (ref 65–99)
Potassium: 4.5 mmol/L (ref 3.5–5.2)
Sodium: 141 mmol/L (ref 134–144)

## 2015-10-13 LAB — TSH: TSH: 3.8 u[IU]/mL (ref 0.450–4.500)

## 2015-10-13 LAB — T3, FREE: T3, Free: 2.9 pg/mL (ref 2.0–4.4)

## 2015-10-13 LAB — T4, FREE: Free T4: 1.4 ng/dL (ref 0.82–1.77)

## 2015-11-30 ENCOUNTER — Other Ambulatory Visit: Payer: Self-pay | Admitting: Obstetrics and Gynecology

## 2015-11-30 DIAGNOSIS — Z1231 Encounter for screening mammogram for malignant neoplasm of breast: Secondary | ICD-10-CM

## 2015-12-05 ENCOUNTER — Encounter: Payer: Self-pay | Admitting: Hematology and Oncology

## 2015-12-14 ENCOUNTER — Inpatient Hospital Stay: Payer: 59 | Attending: Hematology and Oncology | Admitting: Hematology and Oncology

## 2015-12-14 VITALS — BP 131/88 | HR 83 | Temp 98.7°F | Resp 18 | Wt 209.4 lb

## 2015-12-14 DIAGNOSIS — N939 Abnormal uterine and vaginal bleeding, unspecified: Secondary | ICD-10-CM | POA: Diagnosis not present

## 2015-12-14 DIAGNOSIS — R5383 Other fatigue: Secondary | ICD-10-CM | POA: Diagnosis not present

## 2015-12-14 DIAGNOSIS — K219 Gastro-esophageal reflux disease without esophagitis: Secondary | ICD-10-CM | POA: Insufficient documentation

## 2015-12-14 DIAGNOSIS — M129 Arthropathy, unspecified: Secondary | ICD-10-CM | POA: Diagnosis not present

## 2015-12-14 DIAGNOSIS — Z79899 Other long term (current) drug therapy: Secondary | ICD-10-CM | POA: Insufficient documentation

## 2015-12-14 DIAGNOSIS — C9110 Chronic lymphocytic leukemia of B-cell type not having achieved remission: Secondary | ICD-10-CM | POA: Diagnosis present

## 2015-12-14 DIAGNOSIS — M542 Cervicalgia: Secondary | ICD-10-CM | POA: Diagnosis not present

## 2015-12-14 DIAGNOSIS — D259 Leiomyoma of uterus, unspecified: Secondary | ICD-10-CM | POA: Diagnosis not present

## 2015-12-14 DIAGNOSIS — K5730 Diverticulosis of large intestine without perforation or abscess without bleeding: Secondary | ICD-10-CM | POA: Diagnosis not present

## 2015-12-14 DIAGNOSIS — Z800 Family history of malignant neoplasm of digestive organs: Secondary | ICD-10-CM | POA: Diagnosis not present

## 2015-12-14 DIAGNOSIS — D649 Anemia, unspecified: Secondary | ICD-10-CM | POA: Insufficient documentation

## 2015-12-14 NOTE — Progress Notes (Signed)
[facility] day:  12/14/15  Chief Complaint: Joanna Phillips is a 65 y.o. female with chronic lymphocytic leukemia (CLL) who is seen for 6 month assessment.  HPI:  The patient was last seen in the medical oncology clinic on 06/15/2015.  At that time, she was seen for initial assessment by me.  She denied any B symptoms.  CBC was stable.  WBC was 17,200 with an [place] of 3100.  Hematocrit and platelet count were normal.  Uric acid was 8.0 (elevated).  LDH was 217.  She was prescribed allopurinol.  She broke out in a pruritic rash.  Allopurinol was stopped.  During the interim, she has done well.  She notes an achy neck.  She tires out quickly.  She denies any B symptoms.  She denies any adenopathy, bruising or bleeding.  She has had no interval infections.  She notes a little vaginal bleeding each month.  She comments that last year, the bleeding stopped in March.  This year, she has a little bit of bleeding each month.  She had a recent PAP smear.  She was seen by Glenis Smoker, CNM on 11/29/2015.  She has been referred to Dr. Ouida Sills for sonohystogram, EMB and PAP (appointment 01/07/2016).  [facility] labs on 12/05/2015 revealed a hematocrit of 34.1 hemoglobin 10.9, MCV 81, platelets 213,000, WBC 17,700 with an ANC of 3100.  Absolutle lymphocyte count (ALC) was 13,600.  Uric acid was 6.0.  Creatinine was 0.81.  LDH was 212.   Past Medical History:  Diagnosis Date  . Arthritis   . CLL (chronic lymphocytic leukemia) ([place]) 2011  . GERD (gastroesophageal reflux disease)     Past Surgical History:  Procedure Laterality Date  . CHOLECYSTECTOMY  1996  . COLONOSCOPY  2013    Family History  Problem Relation Age of Onset  . Cancer Father     lung  . Diabetes Mother     Social History:  reports that she has never smoked. She has never used smokeless tobacco. She reports that she does not drink alcohol or use drugs.  The patient is accompanied by her  husband, [name], today.  Allergies:  Allergies  Allergen Reactions  . Amoxicillin Hives  . Hydrocodone Itching  . Sulfamethoxazole-Trimethoprim Nausea Only  . Allopurinol Rash    Current Medications: Current Outpatient Prescriptions  Medication Sig Dispense Refill  . Calcium Carbonate-Vitamin D (CALCIUM + D PO) Take 1 tablet by mouth daily.    . diphenhydrAMINE (BENADRYL) 25 MG tablet Take 1 tablet (25 mg total) by mouth every 6 (six) hours as needed. 30 tablet 0  . fluticasone (FLONASE) 50 MCG/ACT nasal spray Place into both nostrils as needed.     Marland Kitchen ibuprofen (ADVIL,MOTRIN) 800 MG tablet Take 1 tablet by mouth two  times daily as needed 180 tablet 2  . loratadine (CLARITIN) 10 MG tablet Take 1 tablet (10 mg total) by mouth daily. 30 tablet 11  . omeprazole (PRILOSEC) 20 MG capsule Take 1 capsule by mouth two times daily 180 capsule 3  . meclizine (ANTIVERT) 25 MG tablet TAKE 1 TABLET (25 MG TOTAL) BY MOUTH 3 (THREE) TIMES DAILY AS NEEDED FOR DIZZINESS. (Patient not taking: Reported on 12/14/2015) 30 tablet 0  . valACYclovir (VALTREX) 1000 MG tablet Take 2 tablets (2,000 mg total) by mouth 2 (two) times daily. For 1 day. (Patient not taking: Reported on 12/14/2015) 10 tablet 11   No current facility-administered medications for this visit.  Review of Systems:  GENERAL:  Feels "ok".  Fatigues easily.  No fevers, sweats or weight loss.  Weight up 2 pounds. PERFORMANCE STATUS (ECOG):  0 HEENT:  No visual changes, runny nose, sore throat, mouth sores or tenderness. Lungs: No shortness of breath or cough.  No hemoptysis. Cardiac:  No chest pain, palpitations, orthopnea, or PND. GI:  No nausea, vomiting, diarrhea, constipation, melena or hematochezia. GU:  No urgency, frequency, dysuria, or hematuria.  Vaginal bleeding, a little each mont. Musculoskeletal: Neck ache.  No back pain.  No joint pain.  No muscle tenderness. Extremities:  No pain or swelling. Skin:  No rashes or skin  changes. Neuro:  No headache, numbness or weakness, balance or coordination issues. Endocrine:  No diabetes, thyroid issues, hot flashes or night sweats. Psych:  No mood changes, depression or anxiety. Pain:  No focal pain. Review of systems:  All other systems reviewed and found to be negative.  Physical Exam: Blood pressure 131/88, pulse 83, temperature 98.7 F (37.1 C), temperature source Tympanic, resp. rate 18, weight 209 lb 7 oz (95 kg). GENERAL:  Well developed, well nourished, sitting comfortably in the exam room in no acute distress. MENTAL STATUS:  Alert and oriented to person, place and time. HEAD:  Styled short brown hair.  Normocephalic, atraumatic, face symmetric, no Cushingoid features. EYES:  Glasses.  Blue eyes.  Pupils equal round and reactive to light and accomodation.  No conjunctivitis or scleral icterus. ENT:  Oropharynx clear without lesion.  Tongue normal. Mucous membranes moist.  RESPIRATORY:  Clear to auscultation without rales, wheezes or rhonchi. CARDIOVASCULAR:  Regular rate and rhythm without murmur, rub or gallop. ABDOMEN:  Soft, non-tender, with active bowel sounds, and no hepatosplenomegaly.  No masses. SKIN:  No rashes, ulcers or lesions. EXTREMITIES: No edema, no skin discoloration or tenderness.  No palpable cords. LYMPH NODES:  No palpable cervical, supraclavicular, axillary or inguinal adenopathy  NEUROLOGICAL: Unremarkable. PSYCH:  Appropriate.   No visits with results within 3 Day(s) from this visit.  Latest known visit with results is:  Office Visit on 10/11/2015  Component Date Value Ref Range Status  . TSH 10/13/2015 3.800  0.450 - 4.500 uIU/mL Final  . T3, Free 10/13/2015 2.9  2.0 - 4.4 pg/mL Final  . Free T4 10/13/2015 1.40  0.82 - 1.77 ng/dL Final  . Glucose 10/13/2015 116* 65 - 99 mg/dL Final  . BUN 10/13/2015 21  8 - 27 mg/dL Final  . Creatinine, Ser 10/13/2015 0.90  0.57 - 1.00 mg/dL Final  . GFR calc non Af Amer 10/13/2015 67  >59  mL/min/1.73 Final  . GFR calc Af Amer 10/13/2015 78  >59 mL/min/1.73 Final  . BUN/Creatinine Ratio 10/13/2015 23  12 - 28 Final  . Sodium 10/13/2015 141  134 - 144 mmol/L Final  . Potassium 10/13/2015 4.5  3.5 - 5.2 mmol/L Final  . Chloride 10/13/2015 104  96 - 106 mmol/L Final  . CO2 10/13/2015 22  18 - 29 mmol/L Final  . Calcium 10/13/2015 8.7  8.7 - 10.3 mg/dL Final   [facility] Labs:  Labs on 06/08/2015 revealed a hematocrit of 35.1, hemoglobin 11.7, MCV 81, platelets 225,000, WBC 17,200 with an ANC of 3100.  Absolutle lymphocyte count (ALC) was 12,200.  Uric acid was 8.0 (elevated; last check 7.7- elevated).  Creatinine was 0.90.  LDH was 217.  [facility] labs on 12/05/2015 revealed a hematocrit of 34.1 hemoglobin 10.9, MCV 81, platelets 213,000, WBC 17,700 with an ANC of 3100.  Absolute lymphocyte count (ALC) was 13,600.  Uric acid was 6.0.  Creatinine was 0.81.  LDH was 212.   Assessment:  LALI POLLINS is a 65 y.o. female with stage I chronic lymphocytic leukemia (CLL) diagnosed in 2011.  She presented with mild lymphocytosis and small cervical adenopathy on routine physical exam.  CBC revealed only lymphocytosis.  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  SPEP was normal.  Chest, abdomen, and pelvic CT scan revealed small diffuse nodes.  She has a history of intermittent rectal and vaginal bleeding in 2015.  She was seen by [facility]necology at the [facility].  Pelvic exam and ultrasound revealed fibroids.   She has recurrent vaginal bleeding.  EGD on 11/11/2011 revealed a hiatal hernia and gastritis.  Colonoscopy on 11/11/2011 revealed diverticulosis in the ascending, descending, and sigmoid colon.  Stool guaiacs x 3 were negative in 06/2014.  She denies any melena or hematochezia.   Symptomatically, she is a little fatigued.  She notes recurrent  vaginal bleeding (a little each month).  CBC reveals slight anemia (hematocrit 34.1).  Plan: 1.  Review [facility] labs.  Discuss mild anemia.  Discuss additional labs at next check. 2.  [facility] slips.  CBC with diff, CMP, retic, ferritin, iron studies, [place]. 3.  Encourage patient follow-up with gynecology at [facility]. 4.  RTC in 6 months for MD assess and review of [facility] labs.   Lequita Asal, MD  12/14/2015, 3:00 PM

## 2015-12-14 NOTE — Progress Notes (Signed)
Patient is here for follow up, mentions a headache that started Wednesday. She says the pain radiates from her neck to the back of her head all the way up.

## 2015-12-16 ENCOUNTER — Encounter: Payer: Self-pay | Admitting: Hematology and Oncology

## 2015-12-19 ENCOUNTER — Ambulatory Visit
Admission: RE | Admit: 2015-12-19 | Discharge: 2015-12-19 | Disposition: A | Discharge status: Home / Self Care | Payer: 59 | Source: Ambulatory Visit | Attending: Obstetrics and Gynecology | Admitting: Obstetrics and Gynecology

## 2015-12-19 ENCOUNTER — Other Ambulatory Visit: Payer: Self-pay | Admitting: Obstetrics and Gynecology

## 2015-12-19 DIAGNOSIS — Z1231 Encounter for screening mammogram for malignant neoplasm of breast: Secondary | ICD-10-CM

## 2015-12-19 DIAGNOSIS — R921 Mammographic calcification found on diagnostic imaging of breast: Secondary | ICD-10-CM | POA: Diagnosis not present

## 2015-12-21 DIAGNOSIS — Z8619 Personal history of other infectious and parasitic diseases: Secondary | ICD-10-CM | POA: Insufficient documentation

## 2015-12-24 DIAGNOSIS — L03213 Periorbital cellulitis: Secondary | ICD-10-CM | POA: Insufficient documentation

## 2015-12-25 ENCOUNTER — Other Ambulatory Visit: Payer: Self-pay | Admitting: Obstetrics and Gynecology

## 2015-12-25 DIAGNOSIS — N63 Unspecified lump in unspecified breast: Secondary | ICD-10-CM

## 2015-12-28 ENCOUNTER — Telehealth: Payer: Self-pay | Admitting: Family Medicine

## 2015-12-28 DIAGNOSIS — B029 Zoster without complications: Secondary | ICD-10-CM

## 2015-12-28 MED ORDER — CAPSAICIN 0.025 % EX CREA: TOPICAL_CREAM | Freq: Two times a day (BID) | CUTANEOUS | 0 refills | Status: DC

## 2015-12-28 MED ORDER — GABAPENTIN 300 MG PO CAPS: ORAL_CAPSULE | ORAL | 3 refills | Status: DC

## 2015-12-28 NOTE — Telephone Encounter (Signed)
Pt was at [facility] for a severe case of shingle.  They started on neck and went to top of head and eye.  She has an reallly bad headache and asked for something for pain.  Please call Jimmy back at [phone]

## 2015-12-28 NOTE — Telephone Encounter (Signed)
Pt was seen at [facility] for shingles. Have offered narcotics for severe pain but family cannot get here to pick it up. Pt will try motrin OTC over the weekend, up titrated gabapentin, and zosterix cream PRN.

## 2016-01-01 ENCOUNTER — Encounter: Payer: Self-pay | Admitting: Family Medicine

## 2016-01-01 ENCOUNTER — Telehealth: Payer: Self-pay | Admitting: *Deleted

## 2016-01-01 ENCOUNTER — Ambulatory Visit (INDEPENDENT_AMBULATORY_CARE_PROVIDER_SITE_OTHER): Payer: 59 | Admitting: Family Medicine

## 2016-01-01 VITALS — BP 113/80 | HR 114 | Temp 98.7°F | Resp 16 | Ht 65.0 in | Wt 198.0 lb

## 2016-01-01 DIAGNOSIS — B019 Varicella without complication: Secondary | ICD-10-CM

## 2016-01-01 DIAGNOSIS — B029 Zoster without complications: Secondary | ICD-10-CM | POA: Diagnosis not present

## 2016-01-01 DIAGNOSIS — R599 Enlarged lymph nodes, unspecified: Secondary | ICD-10-CM | POA: Insufficient documentation

## 2016-01-01 DIAGNOSIS — D72820 Lymphocytosis (symptomatic): Secondary | ICD-10-CM | POA: Insufficient documentation

## 2016-01-01 DIAGNOSIS — R591 Generalized enlarged lymph nodes: Secondary | ICD-10-CM | POA: Insufficient documentation

## 2016-01-01 MED ORDER — GABAPENTIN 300 MG PO CAPS: ORAL_CAPSULE | ORAL | 3 refills | Status: DC

## 2016-01-01 MED ORDER — HYDROXYZINE HCL 25 MG PO TABS: ORAL_TABLET | ORAL | 2 refills | Status: DC

## 2016-01-01 NOTE — Telephone Encounter (Signed)
rcvd call from husband stating that dr Luan Pulling office wanted him to call because pt has bad case of shingles and becasuse she has leukemia she may need to be seen.  I called and spoke to pt and husband. Husband states that dr Luan Pulling thought she needs to be seen because lymph nodes enlarged around the neck.  When I read dr Luan Pulling note I did not find that info.  It did say she has crustation at ear around one side of her neck.  She was also put on valcyclovir, gabapentin and hydroxyzine.  She has f/u in 9 days according to note.  Husband said that dr Luan Pulling not at work tom. But you could call and speak with PA.  Pt/ husband needs a call from our office tom as to when to see her back.  I had originally tol dhim 1 month and that you wanted her to be on prophylactic atb but when he mentioned lymph nodes enlarged I told him I would check with you. Please send messge to hayley or brittany to call pt back tom. With your plan for her . thanks

## 2016-01-01 NOTE — Progress Notes (Signed)
Name: Joanna Phillips   MRN: [id number]    DOB: 1950-11-08   Date:01/01/2016       Progress Note  Subjective  Chief Complaint  Chief Complaint  Patient presents with  . Hospitalization Follow-up  . Herpes Zoster    HPI Here for f/u of shingles of the face involving (mouth and ear).  She was hospitalized at [facility] for antiviral therapy. She had had oral surgery and root canal  Just prior to the outbreak of shingles.  Hospitalized from 12/21/15-12/25/15.  Her pain is 50% better at this time.  No problem-specific Assessment & Plan notes found for this encounter.   Past Medical History:  Diagnosis Date  . Arthritis   . CLL (chronic lymphocytic leukemia) ([place]) 2011  . GERD (gastroesophageal reflux disease)     Past Surgical History:  Procedure Laterality Date  . CHOLECYSTECTOMY  1996  . COLONOSCOPY  2013    Family History  Problem Relation Age of Onset  . Cancer Father     lung  . Diabetes Mother   . Breast cancer Maternal Aunt     Social History   Social History  . Marital status: Married    Spouse name: N/A  . Number of children: N/A  . Years of education: N/A   Occupational History  . Not on file.   Social History Main Topics  . Smoking status: Never Smoker  . Smokeless tobacco: Never Used  . Alcohol use No  . Drug use: No  . Sexual activity: Not on file   Other Topics Concern  . Not on file   Social History Narrative  . No narrative on file     Current Outpatient Prescriptions:  .  Calcium Carbonate-Vitamin D (CALCIUM + D PO), Take 1 tablet by mouth daily., Disp: , Rfl:  .  diphenhydrAMINE (BENADRYL) 25 MG tablet, Take 1 tablet (25 mg total) by mouth every 6 (six) hours as needed., Disp: 30 tablet, Rfl: 0 .  fluticasone (FLONASE) 50 MCG/ACT nasal spray, Place into both nostrils as needed. , Disp: , Rfl:  .  gabapentin (NEURONTIN) 300 MG capsule, Take 1 tablet three times a day, Disp: 120 capsule, Rfl: 3 .  ibuprofen (ADVIL,MOTRIN) 800 MG tablet, Take 1  tablet by mouth two  times daily as needed, Disp: 180 tablet, Rfl: 2 .  loratadine (CLARITIN) 10 MG tablet, Take 1 tablet (10 mg total) by mouth daily., Disp: 30 tablet, Rfl: 11 .  meclizine (ANTIVERT) 25 MG tablet, TAKE 1 TABLET (25 MG TOTAL) BY MOUTH 3 (THREE) TIMES DAILY AS NEEDED FOR DIZZINESS., Disp: 30 tablet, Rfl: 0 .  omeprazole (PRILOSEC) 20 MG capsule, Take 1 capsule by mouth two times daily, Disp: 180 capsule, Rfl: 3 .  valACYclovir (VALTREX) 1000 MG tablet, Take 2 tablets (2,000 mg total) by mouth 2 (two) times daily. For 1 day., Disp: 10 tablet, Rfl: 11 .  hydrOXYzine (ATARAX/VISTARIL) 25 MG tablet, Take 1 twice a day and 1 or 2 at night as needed., Disp: 50 tablet, Rfl: 2  Allergies  Allergen Reactions  . Amoxicillin Hives  . Hydrocodone Itching  . Sulfamethoxazole-Trimethoprim Nausea Only  . Allopurinol Rash     Review of Systems  Constitutional: Positive for malaise/fatigue. Negative for chills, fever and weight loss.  HENT: Negative for hearing loss.   Eyes: Negative for blurred vision and double vision.  Respiratory: Negative for cough, shortness of breath and wheezing.   Cardiovascular: Negative for chest pain, orthopnea and leg swelling.  Gastrointestinal: Negative for abdominal pain, blood in stool and heartburn.  Genitourinary: Negative for dysuria, frequency and urgency.  Musculoskeletal: Negative for myalgias.  Skin: Positive for rash.  Neurological: Positive for tingling (face), weakness and headaches.      Objective  Vitals:   01/01/16 1353  BP: 113/80  Pulse: (!) 114  Resp: 16  Temp: 98.7 F (37.1 C)  TempSrc: Oral  Weight: 198 lb (89.8 kg)  Height: 5\' 5"  (1.651 m)    Physical Exam  Constitutional: She is well-developed, well-nourished, and in no distress. No distress.  HENT:  Head: Normocephalic and atraumatic.  Cardiovascular: Normal rate, regular rhythm and normal heart sounds.   Pulmonary/Chest: Effort normal and breath sounds normal.   Skin:  Crusted lesions on R scalp and behind R ear.  Also crusted lesions on R neck and R upper chest and L: abe and L mid back.  Vitals reviewed.      Recent Results (from the past 2160 hour(s))  TSH     Status: None   Collection Time: 10/12/15 10:30 AM  Result Value Ref Range   TSH 3.800 0.450 - 4.500 uIU/mL  T3, free     Status: None   Collection Time: 10/12/15 10:30 AM  Result Value Ref Range   T3, Free 2.9 2.0 - 4.4 pg/mL  T4, free     Status: None   Collection Time: 10/12/15 10:30 AM  Result Value Ref Range   Free T4 1.40 0.82 - 1.77 ng/dL  Basic Metabolic Panel (BMET)     Status: Abnormal   Collection Time: 10/12/15 10:30 AM  Result Value Ref Range   Glucose 116 (H) 65 - 99 mg/dL   BUN 21 8 - 27 mg/dL   Creatinine, Ser 0.90 0.57 - 1.00 mg/dL   GFR calc non Af Amer 67 >59 mL/min/1.73   GFR calc Af Amer 78 >59 mL/min/1.73   BUN/Creatinine Ratio 23 12 - 28   Sodium 141 134 - 144 mmol/L   Potassium 4.5 3.5 - 5.2 mmol/L   Chloride 104 96 - 106 mmol/L   CO2 22 18 - 29 mmol/L   Calcium 8.7 8.7 - 10.3 mg/dL     Assessment & Plan  Problem List Items Addressed This Visit    None    Visit Diagnoses    Varicella zoster    -  Primary   Relevant Medications   gabapentin (NEURONTIN) 300 MG capsule   hydrOXYzine (ATARAX/VISTARIL) 25 MG tablet      Meds ordered this encounter  Medications  . gabapentin (NEURONTIN) 300 MG capsule    Sig: Take 1 tablet three times a day    Dispense:  120 capsule    Refill:  3  . hydrOXYzine (ATARAX/VISTARIL) 25 MG tablet    Sig: Take 1 twice a day and 1 or 2 at night as needed.    Dispense:  50 tablet    Refill:  2   1. Varicella zoster Finish Valcyclovir  - gabapentin (NEURONTIN) 300 MG capsule; Take 1 tablet three times a day  Dispense: 120 capsule; Refill: 3 - hydrOXYzine (ATARAX/VISTARIL) 25 MG tablet; Take 1 twice a day and 1 or 2 at night as needed.  Dispense: 50 tablet; Refill: 2

## 2016-01-02 ENCOUNTER — Encounter: Payer: Self-pay | Admitting: *Deleted

## 2016-01-02 ENCOUNTER — Telehealth: Payer: Self-pay | Admitting: *Deleted

## 2016-01-02 NOTE — Telephone Encounter (Signed)
Left voicemail with patient that Dr. Mike Gip wants to follow up in a couple of weeks. Informed pt that can call to reschedule appt sooner if is not feeling well. Message sent to scheduling to notify pt regarding appt.

## 2016-01-02 NOTE — Telephone Encounter (Signed)
-----   Message from Lequita Asal, MD sent at 01/02/2016  9:22 AM EDT ----- Regarding: Follow-up appt  We can see her back in the next couple of weeks, sooner if she is not doing well.  Lymph nodes can come and go with this disorder and are not a cause for alarm unless getting large and causing symptoms.  M  ----- Message ----- From: Luella Cook, RN Sent: 01/01/2016   6:05 PM To: Lequita Asal, MD  I called and spoke to pt and husband. Husband states that dr Luan Pulling thought she needs to be seen because lymph nodes around the neck.  When I read dr Luan Pulling note I did not find that info.  It did say she has crustation at ear around one side of her neck.  She was also put on valcyclovir, gabapentin and hydroxyzine.  She has f/u in 9 days according to note.  Husband said that dr Luan Pulling not at work tom. But you could call and speak with PA.  Pt/ husband needs a call from our office tom as to when to see her back.  I had originally tol dhim 1 month and that you wanted her to be on prophylactic atb but when he mentioned lymph nodes enlarged I told him I would check with you. Please send messge to [place] Nase or brittany to call pt back tom. With your plan for her . thanks

## 2016-01-04 ENCOUNTER — Telehealth: Payer: Self-pay | Admitting: *Deleted

## 2016-01-04 MED ORDER — CAPSAICIN 0.025 % EX CREA: TOPICAL_CREAM | Freq: Two times a day (BID) | CUTANEOUS | 0 refills | Status: DC

## 2016-01-04 NOTE — Telephone Encounter (Signed)
Patient's spouse called and is asking if something else ( a cream) could be called in for burning sensation in wife's head. She is taking Gabapentin, hydoxyzine and valacyclovir for shingles.

## 2016-01-04 NOTE — Telephone Encounter (Signed)
Patient's spouse notified.[place]

## 2016-01-04 NOTE — Telephone Encounter (Signed)
Please let them know they can get Zosterix cream or capsaicin cream over the counter. I will send it to [facility] but insurance may or may not pay for it. Apply small amount twice daily to affected area. Thanks! AK

## 2016-01-07 ENCOUNTER — Telehealth: Payer: Self-pay | Admitting: Family Medicine

## 2016-01-07 NOTE — Telephone Encounter (Signed)
I can increase the Gabapentin to 400 mg, 1 three times a day if she would like to try a higher dose.  Let me know.  I could send in some Tramadol 50 mg up to three times a day if she needs more pain relief quicker. Let me know.-jh

## 2016-01-07 NOTE — Telephone Encounter (Signed)
Pt. Husband called states that wife was still having  pain  behind the ears right side of the face. Pt  Call back  # is [phone]

## 2016-01-08 ENCOUNTER — Encounter: Payer: Self-pay | Admitting: Family Medicine

## 2016-01-08 ENCOUNTER — Ambulatory Visit (INDEPENDENT_AMBULATORY_CARE_PROVIDER_SITE_OTHER): Payer: 59 | Admitting: Family Medicine

## 2016-01-08 VITALS — BP 128/67 | HR 101 | Temp 98.4°F | Resp 16 | Ht 65.0 in | Wt 200.0 lb

## 2016-01-08 DIAGNOSIS — N390 Urinary tract infection, site not specified: Secondary | ICD-10-CM

## 2016-01-08 DIAGNOSIS — B027 Disseminated zoster: Secondary | ICD-10-CM

## 2016-01-08 LAB — POCT URINALYSIS DIPSTICK
BILIRUBIN UA: NEGATIVE
Glucose, UA: NEGATIVE
KETONES UA: NEGATIVE
Nitrite, UA: NEGATIVE
SPEC GRAV UA: 1.02
Urobilinogen, UA: NEGATIVE
pH, UA: 5

## 2016-01-08 MED ORDER — PHENAZOPYRIDINE HCL 100 MG PO TABS: 100.0000 mg | ORAL_TABLET | Freq: Three times a day (TID) | ORAL | 0 refills | Status: DC | PRN

## 2016-01-08 MED ORDER — NITROFURANTOIN MONOHYD MACRO 100 MG PO CAPS: 100.0000 mg | ORAL_CAPSULE | Freq: Two times a day (BID) | ORAL | 0 refills | Status: DC

## 2016-01-08 NOTE — Telephone Encounter (Signed)
Would need for me or Amy to see her and get a Urine specimen to determine if infection or not.  Can go to Urgent Care in [place] after hrs. If more convenient.-jh

## 2016-01-08 NOTE — Assessment & Plan Note (Signed)
All lesions are crusted over. Cleared pt to return to work on Monday 8/28.

## 2016-01-08 NOTE — Progress Notes (Signed)
Subjective:    Patient ID: Joanna Phillips, female    DOB: 12-16-1950, 65 y.o.   MRN: [id number]  HPI: Joanna Phillips is a 65 y.o. female presenting on 01/08/2016 for Urinary Tract Infection (onset yesterday)   HPI  Pt presents for possible UTI.  Urine has been very cloudy all week. Started with dysuria yesterday. No fever. Feels like the stream has not been as strong as it used to be. No flank pain. No large amounts of blood in the urine.  Shingles are crusted over now. Pt would like to know if she can return to work. Was scheduled to see Luan Pulling on Thursday.   Past Medical History:  Diagnosis Date  . Arthritis   . CLL (chronic lymphocytic leukemia) ([place]) 2011  . GERD (gastroesophageal reflux disease)     Current Outpatient Prescriptions on File Prior to Visit  Medication Sig  . Calcium Carbonate-Vitamin D (CALCIUM + D PO) Take 1 tablet by mouth daily.  . capsaicin (ZOSTRIX) 0.025 % cream Apply topically 2 (two) times daily.  . diphenhydrAMINE (BENADRYL) 25 MG tablet Take 1 tablet (25 mg total) by mouth every 6 (six) hours as needed.  . fluticasone (FLONASE) 50 MCG/ACT nasal spray Place into both nostrils as needed.   . gabapentin (NEURONTIN) 300 MG capsule Take 1 tablet three times a day  . hydrOXYzine (ATARAX/VISTARIL) 25 MG tablet Take 1 twice a day and 1 or 2 at night as needed.  Marland Kitchen ibuprofen (ADVIL,MOTRIN) 800 MG tablet Take 1 tablet by mouth two  times daily as needed  . loratadine (CLARITIN) 10 MG tablet Take 1 tablet (10 mg total) by mouth daily.  . meclizine (ANTIVERT) 25 MG tablet TAKE 1 TABLET (25 MG TOTAL) BY MOUTH 3 (THREE) TIMES DAILY AS NEEDED FOR DIZZINESS.  Marland Kitchen omeprazole (PRILOSEC) 20 MG capsule Take 1 capsule by mouth two times daily  . valACYclovir (VALTREX) 1000 MG tablet Take 2 tablets (2,000 mg total) by mouth 2 (two) times daily. For 1 day.   No current facility-administered medications on file prior to visit.     Review of Systems  Constitutional: Negative  for chills and fever.  HENT: Negative.   Respiratory: Negative for cough, chest tightness and wheezing.   Cardiovascular: Negative for chest pain and leg swelling.  Gastrointestinal: Negative for abdominal pain, constipation, diarrhea, nausea and vomiting.  Endocrine: Negative.  Negative for cold intolerance, heat intolerance, polydipsia, polyphagia and polyuria.  Genitourinary: Positive for dysuria and urgency. Negative for difficulty urinating and hematuria.  Musculoskeletal: Negative.   Neurological: Negative for dizziness, light-headedness and numbness.  Psychiatric/Behavioral: Negative.    Per HPI unless specifically indicated above     Objective:    BP 128/67 (BP Location: Right Arm, Patient Position: Sitting, Cuff Size: Large)   Pulse (!) 101   Temp 98.4 F (36.9 C) (Oral)   Resp 16   Ht 5\' 5"  (1.651 m)   Wt 200 lb (90.7 kg)   BMI 33.28 kg/m   Wt Readings from Last 3 Encounters:  01/08/16 200 lb (90.7 kg)  01/01/16 198 lb (89.8 kg)  12/14/15 209 lb 7 oz (95 kg)    Physical Exam  Constitutional: She is oriented to person, place, and time. She appears well-developed and well-nourished. No distress.  HENT:  Head: Normocephalic and atraumatic.    Cardiovascular: Normal rate and regular rhythm.  Exam reveals no gallop and no friction rub.   No murmur heard. Pulmonary/Chest: Effort normal and breath sounds normal.  No respiratory distress.  Abdominal: Soft. Normal appearance. There is tenderness in the suprapubic area. There is no CVA tenderness.  Neurological: She is alert and oriented to person, place, and time. No cranial nerve deficit. Coordination normal.  Skin: She is not diaphoretic.  Crusted shingles lesion on R chest and R upper back.    Psychiatric: Her behavior is normal.   Results for orders placed or performed in visit on 01/08/16  POCT Urinalysis Dipstick  Result Value Ref Range   Color, UA amber    Clarity, UA clear    Glucose, UA negative     Bilirubin, UA negative    Ketones, UA negative    Spec Grav, UA 1.020    Blood, UA large    pH, UA 5.0    Protein, UA trace    Urobilinogen, UA negative    Nitrite, UA negative    Leukocytes, UA Trace (A) Negative      Assessment & Plan:   Problem List Items Addressed This Visit      Other   Disseminated herpes zoster    All lesions are crusted over. Cleared pt to return to work on Monday 8/28.        Other Visit Diagnoses    UTI (lower urinary tract infection)    -  Primary   Treat for UTI. Culture urine. treat with macrobid BID for 7 days. Alarm symptoms reviewed. Return PRN.    Relevant Medications   nitrofurantoin, macrocrystal-monohydrate, (MACROBID) 100 MG capsule   phenazopyridine (PYRIDIUM) 100 MG tablet   Other Relevant Orders   POCT Urinalysis Dipstick (Completed)   CULTURE, URINE COMPREHENSIVE      Meds ordered this encounter  Medications  . nitrofurantoin, macrocrystal-monohydrate, (MACROBID) 100 MG capsule    Sig: Take 1 capsule (100 mg total) by mouth 2 (two) times daily.    Dispense:  14 capsule    Refill:  0    Order Specific Question:   Supervising Provider    Answer:   Arlis Porta [id number]  . phenazopyridine (PYRIDIUM) 100 MG tablet    Sig: Take 1 tablet (100 mg total) by mouth 3 (three) times daily as needed for pain.    Dispense:  10 tablet    Refill:  0    Order Specific Question:   Supervising Provider    Answer:   Arlis Porta [id number]      Follow up plan: No Follow-up on file.

## 2016-01-08 NOTE — Patient Instructions (Addendum)
You are being treated for a urinary tract infection today. Please take your antibiotic as directed. If you develop severe flank pain, blood in the urine, fever, nausea or vomiting, please seek immediate medical attention in the ER.     Urinary Tract Infection Urinary tract infections (UTIs) can develop anywhere along your urinary tract. Your urinary tract is your body's drainage system for removing wastes and extra water. Your urinary tract includes two kidneys, two ureters, a bladder, and a urethra. Your kidneys are a pair of bean-shaped organs. Each kidney is about the size of your fist. They are located below your ribs, one on each side of your spine. CAUSES Infections are caused by microbes, which are microscopic organisms, including fungi, viruses, and bacteria. These organisms are so small that they can only be seen through a microscope. Bacteria are the microbes that most commonly cause UTIs. SYMPTOMS  Symptoms of UTIs may vary by age and gender of the patient and by the location of the infection. Symptoms in young women typically include a frequent and intense urge to urinate and a painful, burning feeling in the bladder or urethra during urination. Older women and men are more likely to be tired, shaky, and weak and have muscle aches and abdominal pain. A fever may mean the infection is in your kidneys. Other symptoms of a kidney infection include pain in your back or sides below the ribs, nausea, and vomiting. DIAGNOSIS To diagnose a UTI, your caregiver will ask you about your symptoms. Your caregiver will also ask you to provide a urine sample. The urine sample will be tested for bacteria and white blood cells. White blood cells are made by your body to help fight infection. TREATMENT  Typically, UTIs can be treated with medication. Because most UTIs are caused by a bacterial infection, they usually can be treated with the use of antibiotics. The choice of antibiotic and length of treatment  depend on your symptoms and the type of bacteria causing your infection. HOME CARE INSTRUCTIONS  If you were prescribed antibiotics, take them exactly as your caregiver instructs you. Finish the medication even if you feel better after you have only taken some of the medication.  Drink enough water and fluids to keep your urine clear or pale yellow.  Avoid caffeine, tea, and carbonated beverages. They tend to irritate your bladder.  Empty your bladder often. Avoid holding urine for long periods of time.  Empty your bladder before and after sexual intercourse.  After a bowel movement, women should cleanse from front to back. Use each tissue only once. SEEK MEDICAL CARE IF:   You have back pain.  You develop a fever.  Your symptoms do not begin to resolve within 3 days. SEEK IMMEDIATE MEDICAL CARE IF:   You have severe back pain or lower abdominal pain.  You develop chills.  You have nausea or vomiting.  You have continued burning or discomfort with urination. MAKE SURE YOU:   Understand these instructions.  Will watch your condition.  Will get help right away if you are not doing well or get worse.   This information is not intended to replace advice given to you by your health care provider. Make sure you discuss any questions you have with your health care provider.   Document Released: 02/12/2005 Document Revised: 01/24/2015 Document Reviewed: 06/13/2011 Elsevier Interactive Patient Education [employer].

## 2016-01-08 NOTE — Telephone Encounter (Signed)
Pt appointment scheduled for today @ 2:00 pm.

## 2016-01-08 NOTE — Telephone Encounter (Signed)
Pt is advised as per Dr. Luan Pulling she is taking gabapentin 400 mg three times a day but her Sx getting improved doesn't want to start with tramadol likes to wait and will call if it will be getting worst but she thinks that she is starting to get UTI from yesterday please suggest ?

## 2016-01-10 ENCOUNTER — Encounter: Payer: Self-pay | Admitting: Family Medicine

## 2016-01-10 ENCOUNTER — Ambulatory Visit: Payer: 59 | Admitting: Family Medicine

## 2016-01-11 ENCOUNTER — Telehealth: Payer: Self-pay | Admitting: Family Medicine

## 2016-01-11 LAB — CULTURE, URINE COMPREHENSIVE

## 2016-01-11 NOTE — Telephone Encounter (Signed)
Called Joanna Phillips- need HIPPA form for her FMLA. She will bring Monday.

## 2016-01-14 ENCOUNTER — Encounter: Payer: Self-pay | Admitting: Family Medicine

## 2016-01-14 ENCOUNTER — Telehealth: Payer: Self-pay | Admitting: Family Medicine

## 2016-01-14 DIAGNOSIS — B027 Disseminated zoster: Secondary | ICD-10-CM

## 2016-01-14 NOTE — Progress Notes (Signed)
FMLA paperwork certified.

## 2016-01-14 NOTE — Telephone Encounter (Signed)
Please let them know I just need her to sign the HIPPA form. Her office is requesting our office notes and we cannot send them without the signed HIPPA form they gave her with the paperwork.  Thanks! AK

## 2016-01-14 NOTE — Telephone Encounter (Signed)
Joanna Phillips asked for a call back because he needs further clarification about the disability paper work from the [employer].  Please call [phone]

## 2016-01-17 ENCOUNTER — Encounter (INDEPENDENT_AMBULATORY_CARE_PROVIDER_SITE_OTHER): Payer: Self-pay

## 2016-01-17 ENCOUNTER — Inpatient Hospital Stay: Payer: 59 | Attending: Hematology and Oncology | Admitting: Hematology and Oncology

## 2016-01-17 VITALS — BP 116/80 | HR 85 | Temp 96.4°F | Resp 18 | Wt 199.7 lb

## 2016-01-17 DIAGNOSIS — M129 Arthropathy, unspecified: Secondary | ICD-10-CM | POA: Diagnosis not present

## 2016-01-17 DIAGNOSIS — C9110 Chronic lymphocytic leukemia of B-cell type not having achieved remission: Secondary | ICD-10-CM | POA: Insufficient documentation

## 2016-01-17 DIAGNOSIS — B027 Disseminated zoster: Secondary | ICD-10-CM

## 2016-01-17 DIAGNOSIS — D649 Anemia, unspecified: Secondary | ICD-10-CM | POA: Insufficient documentation

## 2016-01-17 DIAGNOSIS — Z803 Family history of malignant neoplasm of breast: Secondary | ICD-10-CM | POA: Diagnosis not present

## 2016-01-17 DIAGNOSIS — R5383 Other fatigue: Secondary | ICD-10-CM

## 2016-01-17 DIAGNOSIS — B029 Zoster without complications: Secondary | ICD-10-CM

## 2016-01-17 DIAGNOSIS — K219 Gastro-esophageal reflux disease without esophagitis: Secondary | ICD-10-CM | POA: Diagnosis not present

## 2016-01-17 DIAGNOSIS — Z801 Family history of malignant neoplasm of trachea, bronchus and lung: Secondary | ICD-10-CM | POA: Diagnosis not present

## 2016-01-17 DIAGNOSIS — K449 Diaphragmatic hernia without obstruction or gangrene: Secondary | ICD-10-CM | POA: Diagnosis not present

## 2016-01-17 DIAGNOSIS — M792 Neuralgia and neuritis, unspecified: Secondary | ICD-10-CM | POA: Diagnosis not present

## 2016-01-17 DIAGNOSIS — N939 Abnormal uterine and vaginal bleeding, unspecified: Secondary | ICD-10-CM

## 2016-01-17 DIAGNOSIS — R220 Localized swelling, mass and lump, head: Secondary | ICD-10-CM | POA: Diagnosis not present

## 2016-01-17 DIAGNOSIS — Z87440 Personal history of urinary (tract) infections: Secondary | ICD-10-CM

## 2016-01-17 DIAGNOSIS — Z79899 Other long term (current) drug therapy: Secondary | ICD-10-CM | POA: Insufficient documentation

## 2016-01-17 DIAGNOSIS — K5730 Diverticulosis of large intestine without perforation or abscess without bleeding: Secondary | ICD-10-CM

## 2016-01-17 DIAGNOSIS — R590 Localized enlarged lymph nodes: Secondary | ICD-10-CM

## 2016-01-17 MED ORDER — VALACYCLOVIR HCL 500 MG PO TABS: 500.0000 mg | ORAL_TABLET | Freq: Two times a day (BID) | ORAL | 1 refills | Status: DC

## 2016-01-17 NOTE — Progress Notes (Signed)
[facility] day:  01/17/16  Chief Complaint: Joanna Phillips is a 65 y.o. female with chronic lymphocytic leukemia (CLL) who is seen for reassessment after interval hospitalization for disseminated zoster.  HPI:  The patient was last seen in the medical oncology clinic on 12/14/2015.  At that time, she was a little fatigued.  She noted recurrent vaginal bleeding (a little each month).  CBC revealed slight anemia (hematocrit 34.1).  We discussed work-up of anemia.  [facility] slips were provided.  She was encouraged to follow-up with gynecology at the [facility].  She was to return in 6 months.  She developed disseminated varicella zoster.  She was hospitalized at [facility] from 12/21/2015 - 12/25/2015.  She had painful vesicular lesions in multiple dermatomes and right sided facial swelling (especially peri-orbitally) after a recent root canal on the same side. There was initial concern for zoster opthalmicus. Opthalmology noted no corneal involvement.  Tzanck smear was positive.  She received IV acyclovir beginning 12/21/2015.  She received a 5 day course of prednisone.  VZV PCR swab was positive, HSV was negative.   During her hospitalization, her pain was well controlled on 100mg  TID of gabapentin. Her lesions crusted by 12/25/2015.   IV acyclovir was switched to oral valacyclovir. She completed a 21-day total course on 01/10/2016.  Her gabapentin was increased to 300mg  TID in anticipation of post-herpetic neuralgia.   There was concern for possible parotitis and peri-orbital cellulitis: She received IV cefepime/clindamycin.  Maxofacial CT scan revealed evidence of right sided parotitis without an obstructing stone and right sided peri-orbital cellulitis. Blood cultures remained negative during her stay.   She notes ongoing pain associated with her scalp.  The right side of her tongue remains swollen, but improved.  She noted decreased right sided hearing.   All lesions have crusted over.  She notes concern for right neck adenopathy.  She was treated for a UTI with Macrobid x 7 days on 01/08/2016.   Past Medical History:  Diagnosis Date  . Arthritis   . CLL (chronic lymphocytic leukemia) ([place]) 2011  . GERD (gastroesophageal reflux disease)     Past Surgical History:  Procedure Laterality Date  . CHOLECYSTECTOMY  1996  . COLONOSCOPY  2013    Family History  Problem Relation Age of Onset  . Cancer Father     lung  . Diabetes Mother   . Breast cancer Maternal Aunt     Social History:  reports that she has never smoked. She has never used smokeless tobacco. She reports that she does not drink alcohol or use drugs.  The patient is accompanied by her husband, [name], today.  Allergies:  Allergies  Allergen Reactions  . Amoxicillin Hives  . Hydrocodone Itching  . Sulfamethoxazole-Trimethoprim Nausea Only  . Allopurinol Rash    Current Medications: Current Outpatient Prescriptions  Medication Sig Dispense Refill  . Calcium Carbonate-Vitamin D (CALCIUM + D PO) Take 1 tablet by mouth daily.    . capsaicin (ZOSTRIX) 0.025 % cream Apply topically 2 (two) times daily. 60 g 0  . diphenhydrAMINE (BENADRYL) 25 MG tablet Take 1 tablet (25 mg total) by mouth every 6 (six) hours as needed. 30 tablet 0  . fluticasone (FLONASE) 50 MCG/ACT nasal spray Place into both nostrils as needed.     . gabapentin (NEURONTIN) 300 MG capsule Take 1 tablet three times a day 120 capsule 3  . hydrOXYzine (ATARAX/VISTARIL) 25 MG tablet Take 1 twice a day  and 1 or 2 at night as needed. 50 tablet 2  . ibuprofen (ADVIL,MOTRIN) 800 MG tablet Take 1 tablet by mouth two  times daily as needed 180 tablet 2  . loratadine (CLARITIN) 10 MG tablet Take 1 tablet (10 mg total) by mouth daily. 30 tablet 11  . meclizine (ANTIVERT) 25 MG tablet TAKE 1 TABLET (25 MG TOTAL) BY MOUTH 3 (THREE) TIMES DAILY AS NEEDED FOR DIZZINESS. 30 tablet 0  . nitrofurantoin,  macrocrystal-monohydrate, (MACROBID) 100 MG capsule Take 1 capsule (100 mg total) by mouth 2 (two) times daily. 14 capsule 0  . omeprazole (PRILOSEC) 20 MG capsule Take 1 capsule by mouth two times daily 180 capsule 3  . phenazopyridine (PYRIDIUM) 100 MG tablet Take 1 tablet (100 mg total) by mouth 3 (three) times daily as needed for pain. 10 tablet 0  . valACYclovir (VALTREX) 1000 MG tablet Take 2 tablets (2,000 mg total) by mouth 2 (two) times daily. For 1 day. 10 tablet 11   No current facility-administered medications for this visit.     Review of Systems:  GENERAL:  Feels better.  Fatigue.  No fevers, sweats or weight loss.  Weight down 10 pounds. PERFORMANCE STATUS (ECOG):  1-2 HEENT:  Decreased right sided hearing.  Right side of tongue swelling.  No visual changes, runny nose, sore throat, mouth sores or tenderness. Lungs: No shortness of breath or cough.  No hemoptysis. Cardiac:  No chest pain, palpitations, orthopnea, or PND. GI:  No nausea, vomiting, diarrhea, constipation, melena or hematochezia. GU:  No urgency, frequency, dysuria, or hematuria.  Musculoskeletal: No back pain.  No joint pain.  No muscle tenderness. Extremities:  No pain or swelling. Skin:  No rashes or skin changes. Neuro:  No headache, numbness or weakness, balance or coordination issues. Endocrine:  No diabetes, thyroid issues, hot flashes or night sweats. Psych:  No mood changes, depression or anxiety. Pain:  No focal pain. Review of systems:  All other systems reviewed and found to be negative.  Physical Exam: Blood pressure 116/80, pulse 85, temperature (!) 96.4 F (35.8 C), resp. rate 18, weight 199 lb 11.8 oz (90.6 kg). GENERAL:  Well developed, well nourished, sitting comfortably in the exam room in no acute distress. MENTAL STATUS:  Alert and oriented to person, place and time. HEAD:  Styled short brown hair matted posteriorly with thick eschars.  No vesicles.  Face symmetric.  No Cushingoid  features. EYES:  Blue eyes.  Pupils equal round and reactive to light and accomodation.  No conjunctivitis or scleral icterus. ENT:  Speech affected by right side of tongue swelling.  Oropharynx clear without lesions. Mucous membranes moist.  Right TM unremarkable without vesicles in  the canal. RESPIRATORY:  Clear to auscultation without rales, wheezes or rhonchi. CARDIOVASCULAR:  Regular rate and rhythm without murmur, rub or gallop. ABDOMEN:  Soft, non-tender, with active bowel sounds, and no hepatosplenomegaly.  No masses. SKIN: Posterior scalp thick crusting.  Isolated scattered truncal lesions (previously vesicles) pointed out by patient, well healed. EXTREMITIES: No edema, no skin discoloration or tenderness.  No palpable cords. LYMPH NODES:  Small 5-7 mm tender right sided cervical nodes.  No palpable supraclavicular, axillary or inguinal adenopathy  NEUROLOGICAL: Unremarkable. PSYCH:  Appropriate.   No visits with results within 3 Day(s) from this visit.  Latest known visit with results is:  Office Visit on 01/08/2016  Component Date Value Ref Range Status  . Color, UA 01/08/2016 amber   Final  . Clarity, UA  01/08/2016 clear   Final  . Glucose, UA 01/08/2016 negative   Final  . Bilirubin, UA 01/08/2016 negative   Final  . Ketones, UA 01/08/2016 negative   Final  . Spec Grav, UA 01/08/2016 1.020   Final  . Blood, UA 01/08/2016 large   Final  . pH, UA 01/08/2016 5.0   Final  . Protein, UA 01/08/2016 trace   Final  . Urobilinogen, UA 01/08/2016 negative   Final  . Nitrite, UA 01/08/2016 negative   Final  . Leukocytes, UA 01/08/2016 Trace* Negative Final  . Culture 01/11/2016 KLEBSIELLA PNEUMONIAE   Final  . Colony Count 01/11/2016 >=100,000 COLONIES/ML   Final  . Organism ID, Bacteria 01/11/2016 KLEBSIELLA PNEUMONIAE   Final   [facility] Labs:  Labs on 06/08/2015 revealed a hematocrit of 35.1, hemoglobin 11.7, MCV 81, platelets 225,000, WBC 17,200 with an ANC of 3100.  Absolutle  lymphocyte count (ALC) was 12,200.  Uric acid was 8.0 (elevated; last check 7.7- elevated).  Creatinine was 0.90.  LDH was 217.  [facility] labs on 12/05/2015 revealed a hematocrit of 34.1 hemoglobin 10.9, MCV 81, platelets 213,000, WBC 17,700 with an ANC of 3100.  Absolute lymphocyte count (ALC) was 13,600.  Uric acid was 6.0.  Creatinine was 0.81.  LDH was 212.   Assessment:  DAZAH LOSIER is a 65 y.o. female with stage I chronic lymphocytic leukemia (CLL) diagnosed in 2011.  She presented with mild lymphocytosis and small cervical adenopathy on routine physical exam.  CBC revealed only lymphocytosis.  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  SPEP was normal.  Chest, abdomen, and pelvic CT scan revealed small diffuse nodes.  She has a history of intermittent rectal and vaginal bleeding in 2015.  She was seen by gynecology at the [facility].  Pelvic exam and ultrasound revealed fibroids.   She has recurrent vaginal bleeding.  EGD on 11/11/2011 revealed a hiatal hernia and gastritis.  Colonoscopy on 11/11/2011 revealed diverticulosis in the ascending, descending, and sigmoid colon.  Stool guaiacs x 3 were negative in 06/2014.  She denies any melena or hematochezia.   She has a history of varicella zoster involving the left side of her scalp years ago.  She was admitted to [facility] from 12/21/2015 - 12/25/2015 with disseminated varicella zoster.  She was treated with IV acyclovir then switched to oral valacyclovir (completed 01/10/2016).  Symptomatically, she has significant post neuralgia pain.  She is on Neurontin 100 mg TID.  Exam reveals matted hair posteriorly with thick eschars.  She has decreased right sided hearing and right tongue swelling, improved.   Exam reveals shotty reactive right sided cervical adenopathy.  Plan: 1.  Discuss prophylactic long  term valacyclovir. 2.  Discuss titration of Neurontin dosing.  She is currently on a low dose.  She states that higher doses have made her sleepy.  She will be seeing her PCP tomorrow for adjustment in her medications. 3.  Discuss labs (CBC with diff, CMP) tomorrow with PCP.  Office contacted. 4.  Discuss ID consult with Dr Ola Spurr.  She has an appointment on 01/22/2016. 5.  Rx:  valacyclovir 500 mg BID. 6.  RTC as previously scheduled.   Lequita Asal, MD  01/17/2016, 4:49 PM

## 2016-01-18 ENCOUNTER — Ambulatory Visit (INDEPENDENT_AMBULATORY_CARE_PROVIDER_SITE_OTHER): Payer: 59 | Admitting: Family Medicine

## 2016-01-18 VITALS — BP 115/70 | HR 87 | Temp 97.9°F | Resp 16 | Ht 65.0 in | Wt 200.6 lb

## 2016-01-18 DIAGNOSIS — C9110 Chronic lymphocytic leukemia of B-cell type not having achieved remission: Secondary | ICD-10-CM | POA: Diagnosis not present

## 2016-01-18 DIAGNOSIS — R319 Hematuria, unspecified: Secondary | ICD-10-CM | POA: Diagnosis not present

## 2016-01-18 DIAGNOSIS — B027 Disseminated zoster: Secondary | ICD-10-CM | POA: Diagnosis not present

## 2016-01-18 DIAGNOSIS — N390 Urinary tract infection, site not specified: Secondary | ICD-10-CM

## 2016-01-18 LAB — POCT URINALYSIS DIPSTICK
BILIRUBIN UA: NEGATIVE
Glucose, UA: NEGATIVE
KETONES UA: NEGATIVE
NITRITE UA: NEGATIVE
PH UA: 5
Protein, UA: NEGATIVE
Spec Grav, UA: 1.01
Urobilinogen, UA: NEGATIVE

## 2016-01-18 MED ORDER — CIPROFLOXACIN HCL 250 MG PO TABS: 250.0000 mg | ORAL_TABLET | Freq: Two times a day (BID) | ORAL | 0 refills | Status: DC

## 2016-01-18 MED ORDER — GABAPENTIN 100 MG PO CAPS: 100.0000 mg | ORAL_CAPSULE | Freq: Every day | ORAL | 3 refills | Status: DC

## 2016-01-18 MED ORDER — OXYCODONE-ACETAMINOPHEN 5-325 MG PO TABS: 1.0000 | ORAL_TABLET | Freq: Three times a day (TID) | ORAL | 0 refills | Status: DC | PRN

## 2016-01-18 NOTE — Progress Notes (Signed)
Subjective:    Patient ID: Joanna Phillips, female    DOB: 1950/07/07, 65 y.o.   MRN: [id number]  HPI: Joanna Phillips is a 65 y.o. female presenting on 01/18/2016 for Headache (had in past but yesterday and today is worst tongue is still swollen and R ear pain)   HPI  Pt presents for headache follow-up. Feels like fireworks in her head. Have started since the shingles.  Having headaches on the R side of the head. Burning sensation on the scalp.  Previous zoster lesions are crusted and matted. It is very painful. She is taking gabapentin but it makes her sleepy when she takes it during the day.  Pt saw her hematologist yesterday- they recommended starting daily valcylovir as preventative. They also recommended an ID consult.   Still having dysuria. No flank pain but burning when she voids. Some burning. Still cloudy urine. UC was indeterminate for macrobid.   Past Medical History:  Diagnosis Date  . Arthritis   . CLL (chronic lymphocytic leukemia) ([place]) 2011  . GERD (gastroesophageal reflux disease)     Current Outpatient Prescriptions on File Prior to Visit  Medication Sig  . Calcium Carbonate-Vitamin D (CALCIUM + D PO) Take 1 tablet by mouth daily.  . capsaicin (ZOSTRIX) 0.025 % cream Apply topically 2 (two) times daily.  . diphenhydrAMINE (BENADRYL) 25 MG tablet Take 1 tablet (25 mg total) by mouth every 6 (six) hours as needed.  . fluticasone (FLONASE) 50 MCG/ACT nasal spray Place into both nostrils as needed.   . gabapentin (NEURONTIN) 300 MG capsule Take 1 tablet three times a day  . hydrOXYzine (ATARAX/VISTARIL) 25 MG tablet Take 1 twice a day and 1 or 2 at night as needed.  Marland Kitchen ibuprofen (ADVIL,MOTRIN) 800 MG tablet Take 1 tablet by mouth two  times daily as needed  . loratadine (CLARITIN) 10 MG tablet Take 1 tablet (10 mg total) by mouth daily.  . meclizine (ANTIVERT) 25 MG tablet TAKE 1 TABLET (25 MG TOTAL) BY MOUTH 3 (THREE) TIMES DAILY AS NEEDED FOR DIZZINESS.  Marland Kitchen omeprazole  (PRILOSEC) 20 MG capsule Take 1 capsule by mouth two times daily  . phenazopyridine (PYRIDIUM) 100 MG tablet Take 1 tablet (100 mg total) by mouth 3 (three) times daily as needed for pain.  . valACYclovir (VALTREX) 1000 MG tablet Take 2 tablets (2,000 mg total) by mouth 2 (two) times daily. For 1 day.  . valACYclovir (VALTREX) 500 MG tablet Take 1 tablet (500 mg total) by mouth 2 (two) times daily.   No current facility-administered medications on file prior to visit.     Review of Systems  Constitutional: Negative for chills and fever.  HENT: Negative.   Respiratory: Negative for cough, chest tightness and wheezing.   Cardiovascular: Negative for chest pain and leg swelling.  Gastrointestinal: Negative for abdominal pain, constipation, diarrhea, nausea and vomiting.  Endocrine: Negative.  Negative for cold intolerance, heat intolerance, polydipsia, polyphagia and polyuria.  Genitourinary: Positive for dysuria and frequency. Negative for decreased urine volume, difficulty urinating, flank pain and urgency.  Musculoskeletal: Negative.   Skin: Positive for wound.  Neurological: Negative for dizziness, light-headedness and numbness.  Hematological: Positive for adenopathy.  Psychiatric/Behavioral: Negative.    Per HPI unless specifically indicated above     Objective:    BP 115/70 (BP Location: Right Arm, Patient Position: Sitting, Cuff Size: Normal)   Pulse 87   Temp 97.9 F (36.6 C) (Oral)   Resp 16   Ht 5'  5" (1.651 m)   Wt 200 lb 9.6 oz (91 kg)   BMI 33.38 kg/m   Wt Readings from Last 3 Encounters:  01/18/16 200 lb 9.6 oz (91 kg)  01/17/16 199 lb 11.8 oz (90.6 kg)  01/08/16 200 lb (90.7 kg)    Physical Exam  Constitutional: She is oriented to person, place, and time. She appears well-developed and well-nourished.  HENT:  Head: Normocephalic and atraumatic.    Neck: Neck supple.  Cardiovascular: Normal rate, regular rhythm and normal heart sounds.  Exam reveals no  gallop and no friction rub.   No murmur heard. Pulmonary/Chest: Effort normal and breath sounds normal. She has no wheezes. She exhibits no tenderness.  Abdominal: Soft. Normal appearance and bowel sounds are normal. She exhibits no distension and no mass. There is no tenderness. There is no rebound, no guarding and no CVA tenderness.  Musculoskeletal: Normal range of motion. She exhibits no edema or tenderness.  Lymphadenopathy:    She has cervical adenopathy.       Right cervical: Superficial cervical adenopathy present.       Left cervical: Superficial cervical adenopathy present.  Neurological: She is alert and oriented to person, place, and time.  Skin: Skin is warm and dry.  Healed zoster lesions on the neck and shoulder   Results for orders placed or performed in visit on 01/18/16  POCT Urinalysis Dipstick  Result Value Ref Range   Color, UA yellow    Clarity, UA clear    Glucose, UA negative    Bilirubin, UA negative    Ketones, UA negative    Spec Grav, UA 1.010    Blood, UA large    pH, UA 5.0    Protein, UA negative    Urobilinogen, UA negative    Nitrite, UA negative    Leukocytes, UA large (3+) (A) Negative      Assessment & Plan:   Problem List Items Addressed This Visit      Other   CLL (chronic lymphocytic leukemia) (HCC)    Check CBC per hematology request.      Relevant Medications   gabapentin (NEURONTIN) 100 MG capsule   oxyCODONE-acetaminophen (PERCOCET/ROXICET) 5-325 MG tablet   ciprofloxacin (CIPRO) 250 MG tablet   Other Relevant Orders   CBC with Differential   Disseminated zoster - Primary    Agree with preventative anti-virals and ID consult. Hematology will manage. Refer to wound care center to see if debridement or wound care can be done to help with scalp lesions for comfort. Appt is on Thursday at 8am.  Increase gabapentin to 300 at bedtime and 100mg  in the AM. Add percocet for severe pain PRN.  Recheck 4 weeks.  Extend FMLA to 10/2        Relevant Medications   gabapentin (NEURONTIN) 100 MG capsule   oxyCODONE-acetaminophen (PERCOCET/ROXICET) 5-325 MG tablet   Other Relevant Orders   Ambulatory referral to Wound Clinic   Comprehensive Metabolic Panel (CMET)    Other Visit Diagnoses    Urinary tract infection with hematuria, site unspecified       Treat with Cipro BID since not fully treated    Relevant Medications   ciprofloxacin (CIPRO) 250 MG tablet   Other Relevant Orders   POCT Urinalysis Dipstick (Completed)      Meds ordered this encounter  Medications  . gabapentin (NEURONTIN) 100 MG capsule    Sig: Take 1 capsule (100 mg total) by mouth daily. In the morning.  Dispense:  90 capsule    Refill:  3    Order Specific Question:   Supervising Provider    Answer:   Arlis Porta [phone]  . oxyCODONE-acetaminophen (PERCOCET/ROXICET) 5-325 MG tablet    Sig: Take 1 tablet by mouth every 8 (eight) hours as needed for severe pain.    Dispense:  10 tablet    Refill:  0    Order Specific Question:   Supervising Provider    Answer:   Arlis Porta [phone]  . ciprofloxacin (CIPRO) 250 MG tablet    Sig: Take 1 tablet (250 mg total) by mouth 2 (two) times daily.    Dispense:  10 tablet    Refill:  0    Order Specific Question:   Supervising Provider    Answer:   Arlis Porta [phone]      Follow up plan: Return in about 4 weeks (around 02/15/2016), or if symptoms worsen or fail to improve, for Headaches.Marland Kitchen

## 2016-01-18 NOTE — Patient Instructions (Addendum)
Go up to 300mg  of gabapentin at bedtime for 1 week and then add 100mg  of gabapentin in the AM to help.  I have given you a few percocets to help with severe pain.  They will make you sleepy. Just take as needed.    We will have you seen by wound care on 9/7 at 8 am to determine if they can do anything for your scalp.   You are being treated for a urinary tract infection today. Please take your antibiotic as directed. If you develop severe flank pain, blood in the urine, fever, nausea or vomiting, please seek immediate medical attention in the ER.

## 2016-01-18 NOTE — Assessment & Plan Note (Signed)
Check CBC per hematology request.

## 2016-01-18 NOTE — Assessment & Plan Note (Signed)
Agree with preventative anti-virals and ID consult. Hematology will manage. Refer to wound care center to see if debridement or wound care can be done to help with scalp lesions for comfort. Appt is on Thursday at 8am.  Increase gabapentin to 300 at bedtime and 100mg  in the AM. Add percocet for severe pain PRN.  Recheck 4 weeks.  Extend FMLA to 10/2

## 2016-01-19 ENCOUNTER — Encounter: Payer: Self-pay | Admitting: Hematology and Oncology

## 2016-01-19 LAB — COMPREHENSIVE METABOLIC PANEL
A/G RATIO: 1.3 (ref 1.2–2.2)
ALBUMIN: 4.2 g/dL (ref 3.6–4.8)
ALT: 20 IU/L (ref 0–32)
AST: 21 IU/L (ref 0–40)
Alkaline Phosphatase: 72 IU/L (ref 39–117)
BUN / CREAT RATIO: 27 (ref 12–28)
BUN: 53 mg/dL — ABNORMAL HIGH (ref 8–27)
Bilirubin Total: <0.2 mg/dL (ref 0.0–1.2)
CALCIUM: 8.9 mg/dL (ref 8.7–10.3)
CO2: 19 mmol/L (ref 18–29)
CREATININE: 1.95 mg/dL — AB (ref 0.57–1.00)
Chloride: 104 mmol/L (ref 96–106)
GFR calc Af Amer: 30 mL/min/{1.73_m2} — ABNORMAL LOW (ref >59)
GFR, EST NON AFRICAN AMERICAN: 26 mL/min/{1.73_m2} — AB (ref >59)
Globulin, Total: 3.2 g/dL (ref 1.5–4.5)
Glucose: 96 mg/dL (ref 65–99)
POTASSIUM: 4.8 mmol/L (ref 3.5–5.2)
SODIUM: 139 mmol/L (ref 134–144)
Total Protein: 7.4 g/dL (ref 6.0–8.5)

## 2016-01-19 LAB — CBC WITH DIFFERENTIAL/PLATELET
BASOS: 0 %
Basophils Absolute: 0 10*3/uL (ref 0.0–0.2)
EOS (ABSOLUTE): 0.2 10*3/uL (ref 0.0–0.4)
EOS: 2 %
HEMATOCRIT: 32.8 % — AB (ref 34.0–46.6)
HEMOGLOBIN: 11 g/dL — AB (ref 11.1–15.9)
IMMATURE GRANS (ABS): 0 10*3/uL (ref 0.0–0.1)
IMMATURE GRANULOCYTES: 0 %
Lymphocytes Absolute: 9.6 10*3/uL — ABNORMAL HIGH (ref 0.7–3.1)
Lymphs: 65 %
MCH: 27 pg (ref 26.6–33.0)
MCHC: 33.5 g/dL (ref 31.5–35.7)
MCV: 81 fL (ref 79–97)
MONOS ABS: 1.1 10*3/uL — AB (ref 0.1–0.9)
Monocytes: 7 %
Neutrophils Absolute: 3.8 10*3/uL (ref 1.4–7.0)
Neutrophils: 26 %
Platelets: 173 10*3/uL (ref 150–379)
RBC: 4.07 x10E6/uL (ref 3.77–5.28)
RDW: 17.4 % — AB (ref 12.3–15.4)
WBC: 14.8 10*3/uL — ABNORMAL HIGH (ref 3.4–10.8)

## 2016-01-22 ENCOUNTER — Other Ambulatory Visit: Payer: Self-pay | Admitting: Family Medicine

## 2016-01-22 ENCOUNTER — Telehealth: Payer: Self-pay | Admitting: Family Medicine

## 2016-01-22 DIAGNOSIS — K1120 Sialoadenitis, unspecified: Secondary | ICD-10-CM

## 2016-01-22 DIAGNOSIS — B027 Disseminated zoster: Secondary | ICD-10-CM

## 2016-01-22 DIAGNOSIS — N179 Acute kidney failure, unspecified: Secondary | ICD-10-CM

## 2016-01-22 DIAGNOSIS — D72829 Elevated white blood cell count, unspecified: Secondary | ICD-10-CM

## 2016-01-22 NOTE — Telephone Encounter (Signed)
Valtrex is okay her CrCl is 41.9 per my calculation on Sunday when I saw the labs. She is on 500 BID and this is okay per lexicomp. I adjusted her gabapentin for renal issues as weel. Spoke with her and she is getting a repeat STAT BMP tomorrow at [facility]- they were also going to mention to Dr. Ola Spurr to recheck a BMP while she was at visit today if he did labs.  The plan is nephrology if renal function is not improve.  This was relayed to the patient on Sunday via telephone.

## 2016-01-23 ENCOUNTER — Telehealth: Payer: Self-pay | Admitting: Family Medicine

## 2016-01-23 ENCOUNTER — Other Ambulatory Visit: Payer: Self-pay | Admitting: *Deleted

## 2016-01-23 DIAGNOSIS — D72829 Elevated white blood cell count, unspecified: Secondary | ICD-10-CM

## 2016-01-23 DIAGNOSIS — N179 Acute kidney failure, unspecified: Secondary | ICD-10-CM

## 2016-01-23 NOTE — Telephone Encounter (Signed)
Discussed ENT referral with patient. She will get her labs drawn today.

## 2016-01-24 ENCOUNTER — Telehealth: Payer: Self-pay | Admitting: Family Medicine

## 2016-01-24 ENCOUNTER — Encounter: Payer: 59 | Attending: Surgery | Admitting: Surgery

## 2016-01-24 DIAGNOSIS — N179 Acute kidney failure, unspecified: Secondary | ICD-10-CM

## 2016-01-24 DIAGNOSIS — S0100XA Unspecified open wound of scalp, initial encounter: Secondary | ICD-10-CM | POA: Diagnosis present

## 2016-01-24 DIAGNOSIS — Z880 Allergy status to penicillin: Secondary | ICD-10-CM | POA: Diagnosis not present

## 2016-01-24 DIAGNOSIS — B027 Disseminated zoster: Secondary | ICD-10-CM | POA: Insufficient documentation

## 2016-01-24 DIAGNOSIS — M1990 Unspecified osteoarthritis, unspecified site: Secondary | ICD-10-CM | POA: Insufficient documentation

## 2016-01-24 DIAGNOSIS — Z79899 Other long term (current) drug therapy: Secondary | ICD-10-CM | POA: Insufficient documentation

## 2016-01-24 DIAGNOSIS — X58XXXA Exposure to other specified factors, initial encounter: Secondary | ICD-10-CM | POA: Diagnosis not present

## 2016-01-24 LAB — COMPREHENSIVE METABOLIC PANEL
ALBUMIN: 4.2 g/dL (ref 3.6–4.8)
ALK PHOS: 64 IU/L (ref 39–117)
ALT: 17 IU/L (ref 0–32)
AST: 20 IU/L (ref 0–40)
Albumin/Globulin Ratio: 1.2 (ref 1.2–2.2)
BUN / CREAT RATIO: 24 (ref 12–28)
BUN: 27 mg/dL (ref 8–27)
Bilirubin Total: 0.3 mg/dL (ref 0.0–1.2)
CALCIUM: 9.2 mg/dL (ref 8.7–10.3)
CO2: 24 mmol/L (ref 18–29)
CREATININE: 1.14 mg/dL — AB (ref 0.57–1.00)
Chloride: 104 mmol/L (ref 96–106)
GFR calc Af Amer: 58 mL/min/{1.73_m2} — ABNORMAL LOW (ref >59)
GFR calc non Af Amer: 51 mL/min/{1.73_m2} — ABNORMAL LOW (ref >59)
GLUCOSE: 118 mg/dL — AB (ref 65–99)
Globulin, Total: 3.4 g/dL (ref 1.5–4.5)
Potassium: 4.9 mmol/L (ref 3.5–5.2)
Sodium: 139 mmol/L (ref 134–144)
Total Protein: 7.6 g/dL (ref 6.0–8.5)

## 2016-01-24 LAB — CBC
HEMATOCRIT: 32.3 % — AB (ref 34.0–46.6)
Hemoglobin: 10.9 g/dL — ABNORMAL LOW (ref 11.1–15.9)
MCH: 27.1 pg (ref 26.6–33.0)
MCHC: 33.7 g/dL (ref 31.5–35.7)
MCV: 80 fL (ref 79–97)
Platelets: 192 10*3/uL (ref 150–379)
RBC: 4.02 x10E6/uL (ref 3.77–5.28)
RDW: 17.7 % — AB (ref 12.3–15.4)
WBC: 17 10*3/uL — AB (ref 3.4–10.8)

## 2016-01-24 NOTE — Telephone Encounter (Signed)
Reviewed labs with patient.  Kidney function is doing well- want to recheck next week.

## 2016-01-25 NOTE — Progress Notes (Signed)
CHARIDY, HELMUS ([id number]) Visit Report for 01/24/2016 Allergy List Details Patient Name: Joanna Phillips, Joanna Phillips. Date of Service: 01/24/2016 8:00 AM Medical Record Number: [id number] Patient Account Number: [id number] Date of Birth/Sex: 11-27-1950 (65 y.o. Female) Treating RN: Montey Hora Primary Care Physician: Dicky Doe Other Clinician: Referring Physician: Fredia Sorrow Treating Physician/Extender: Frann Rider in Treatment: 0 Allergies Active Allergies hydrocodone amoxicillin Septra allopurinol Allergy Notes Electronic Signature(s) Signed: 01/24/2016 5:53:50 PM By: Montey Hora Entered By: Montey Hora on 01/24/2016 08:11:47 Laidler, Consuello Masse ([id number]) -------------------------------------------------------------------------------- Arrival Information Details Patient Name: Joanna Phillips Date of Service: 01/24/2016 8:00 AM Medical Record Number: [id number] Patient Account Number: [id number] Date of Birth/Sex: 01/23/1951 (65 y.o. Female) Treating RN: Montey Hora Primary Care Physician: Dicky Doe Other Clinician: Referring Physician: Fredia Sorrow Treating Physician/Extender: Frann Rider in Treatment: 0 Visit Information Patient Arrived: Ambulatory Arrival Time: 08:09 Accompanied By: spouse Transfer Assistance: None Patient Identification Verified: Yes Secondary Verification Process Yes Completed: Patient Has Alerts: Yes Patient Alerts: NO BP IN THE LEFT ARM Electronic Signature(s) Signed: 01/24/2016 5:53:50 PM By: Montey Hora Entered By: Montey Hora on 01/24/2016 08:14:07 Baca, Consuello Masse ([id number]) -------------------------------------------------------------------------------- Clinic Level of Care Assessment Details Patient Name: Joanna Phillips. Date of Service: 01/24/2016 8:00 AM Medical Record Number: [id number] Patient Account Number: [id number] Date of Birth/Sex: 1950/07/11 (65 y.o. Female) Treating RN: Montey Hora Primary  Care Physician: Dicky Doe Other Clinician: Referring Physician: Fredia Sorrow Treating Physician/Extender: Frann Rider in Treatment: 0 Clinic Level of Care Assessment Items TOOL 2 Quantity Score []  - Use when only an EandM is performed on the INITIAL visit 0 ASSESSMENTS - Nursing Assessment / Reassessment X - General Physical Exam (combine w/ comprehensive assessment (listed just 1 20 below) when performed on new pt. evals) X - Comprehensive Assessment (HX, ROS, Risk Assessments, Wounds Hx, etc.) 1 25 ASSESSMENTS - Wound and Skin Assessment / Reassessment X - Simple Wound Assessment / Reassessment - one wound 1 5 []  - Complex Wound Assessment / Reassessment - multiple wounds 0 X - Dermatologic / Skin Assessment (not related to wound area) 1 10 ASSESSMENTS - Ostomy and/or Continence Assessment and Care []  - Incontinence Assessment and Management 0 []  - Ostomy Care Assessment and Management (repouching, etc.) 0 PROCESS - Coordination of Care X - Simple Patient / Family Education for ongoing care 1 15 []  - Complex (extensive) Patient / Family Education for ongoing care 0 []  - Staff obtains Programmer, systems, Records, Test Results / Process Orders 0 []  - Staff telephones HHA, Nursing Homes / Clarify orders / etc 0 []  - Routine Transfer to another Facility (non-emergent condition) 0 []  - Routine Hospital Admission (non-emergent condition) 0 X - New Admissions / Biomedical engineer / Ordering NPWT, Apligraf, etc. 1 15 []  - Emergency Hospital Admission (emergent condition) 0 []  - Simple Discharge Coordination 0 Witt, MCKENNZIE GORT. ([id number]) []  - Complex (extensive) Discharge Coordination 0 PROCESS - Special Needs []  - Pediatric / Minor Patient Management 0 []  - Isolation Patient Management 0 []  - Hearing / Language / Visual special needs 0 []  - Assessment of Community assistance (transportation, D/C planning, etc.) 0 []  - Additional assistance / Altered mentation 0 []  - Support  Surface(s) Assessment (bed, cushion, seat, etc.) 0 INTERVENTIONS - Wound Cleansing / Measurement X - Wound Imaging (photographs - any number of wounds) 1 5 []  - Wound Tracing (instead of photographs) 0 []  - Simple Wound Measurement - one wound 0 []  - Complex Wound Measurement - multiple  wounds 0 []  - Simple Wound Cleansing - one wound 0 []  - Complex Wound Cleansing - multiple wounds 0 INTERVENTIONS - Wound Dressings []  - Small Wound Dressing one or multiple wounds 0 []  - Medium Wound Dressing one or multiple wounds 0 []  - Large Wound Dressing one or multiple wounds 0 []  - Application of Medications - injection 0 INTERVENTIONS - Miscellaneous []  - External ear exam 0 []  - Specimen Collection (cultures, biopsies, blood, body fluids, etc.) 0 []  - Specimen(s) / Culture(s) sent or taken to Lab for analysis 0 []  - Patient Transfer (multiple staff / Harrel Lemon Lift / Similar devices) 0 []  - Simple Staple / Suture removal (25 or less) 0 []  - Complex Staple / Suture removal (26 or more) 0 Moscoso, VICKIE J. ([id number]) []  - Hypo / Hyperglycemic Management (close monitor of Blood Glucose) 0 []  - Ankle / Brachial Index (ABI) - do not check if billed separately 0 Has the patient been seen at the hospital within the last three years: Yes Total Score: 95 Level Of Care: New/Established - Level 3 Electronic Signature(s) Signed: 01/24/2016 5:53:50 PM By: Montey Hora Entered By: Montey Hora on 01/24/2016 08:38:25 Prestridge, Consuello Masse ([id number]) -------------------------------------------------------------------------------- Encounter Discharge Information Details Patient Name: Joanna Phillips. Date of Service: 01/24/2016 8:00 AM Medical Record Number: [id number] Patient Account Number: [id number] Date of Birth/Sex: [birth year] (65 y.o. Female) Treating RN: Montey Hora Primary Care Physician: Dicky Doe Other Clinician: Referring Physician: Fredia Sorrow Treating Physician/Extender: Frann Rider in Treatment: 0 Encounter Discharge Information Items Discharge Pain Level: 0 Discharge Condition: Stable Ambulatory Status: Ambulatory Discharge Destination: Home Transportation: Private Auto Accompanied By: spouse Schedule Follow-up Appointment: No Medication Reconciliation completed and provided to Patient/Care No Inita Uram: Provided on Clinical Summary of Care: 01/24/2016 Form Type Recipient Paper Patient VW Electronic Signature(s) Signed: 01/24/2016 8:47:34 AM By: Ruthine Dose Entered By: Ruthine Dose on 01/24/2016 08:47:34 Elman, Consuello Masse ([id number]) -------------------------------------------------------------------------------- Multi Wound Chart Details Patient Name: Joanna Phillips. Date of Service: 01/24/2016 8:00 AM Medical Record Number: [id number] Patient Account Number: [id number] Date of Birth/Sex: 1951/02/22 (65 y.o. Female) Treating RN: Montey Hora Primary Care Physician: Dicky Doe Other Clinician: Referring Physician: Fredia Sorrow Treating Physician/Extender: Frann Rider in Treatment: 0 Vital Signs Height(in): 66 Pulse(bpm): 88 Weight(lbs): 200 Blood Pressure 120/72 (mmHg): Body Mass Index(BMI): 32 Temperature(F): 97.6 Respiratory Rate 18 (breaths/min): Photos: [1:No Photos] [N/A:N/A] Wound Location: [1:Midline Head - Parietal] [N/A:N/A] Wounding Event: [1:Blister] [N/A:N/A] Primary Etiology: [1:Infection - not elsewhere classified] [N/A:N/A] Date Acquired: [1:12/17/2015] [N/A:N/A] Weeks of Treatment: [1:0] [N/A:N/A] Wound Status: [1:Healed - Epithelialized] [N/A:N/A] Clustered Wound: [1:Yes] [N/A:N/A] Measurements L x W x D 0x0x0 [N/A:N/A] (cm) Area (cm) : [1:0] [N/A:N/A] Volume (cm) : [1:0] [N/A:N/A] Periwound Skin Texture: No Abnormalities Noted [N/A:N/A] Periwound Skin [1:No Abnormalities Noted] [N/A:N/A] Moisture: Periwound Skin Color: No Abnormalities Noted [N/A:N/A] Tenderness on [1:No]  [N/A:N/A] Treatment Notes Electronic Signature(s) Signed: 01/24/2016 5:53:50 PM By: Montey Hora Entered By: Montey Hora on 01/24/2016 08:43:23 Sanmiguel, Consuello Masse ([id number]) -------------------------------------------------------------------------------- Pain Assessment Details Patient Name: Joanna Phillips. Date of Service: 01/24/2016 8:00 AM Medical Record Number: [id number] Patient Account Number: [id number] Date of Birth/Sex: 1951/05/10 (65 y.o. Female) Treating RN: Montey Hora Primary Care Physician: Dicky Doe Other Clinician: Referring Physician: Fredia Sorrow Treating Physician/Extender: Frann Rider in Treatment: 0 Active Problems Location of Pain Severity and Description of Pain Patient Has Paino Yes Site Locations Pain Location: Pain in Ulcers With Dressing Change: No Duration of the Pain. Constant /  Intermittento Constant Pain Management and Medication Current Pain Management: Notes Topical or injectable lidocaine is offered to patient for acute pain when surgical debridement is performed. If needed, Patient is instructed to use over the counter pain medication for the following 24-48 hours after debridement. Wound care MDs do not prescribed pain medications. Patient has chronic pain or uncontrolled pain. Patient has been instructed to make an appointment with their Primary Care Physician for pain management. Electronic Signature(s) Signed: 01/24/2016 5:53:50 PM By: Montey Hora Entered By: Montey Hora on 01/24/2016 08:11:01 Malacara, Consuello Masse ([id number]) -------------------------------------------------------------------------------- Patient/Caregiver Education Details Patient Name: Joanna Phillips Date of Service: 01/24/2016 8:00 AM Medical Record Number: [id number] Patient Account Number: [id number] Date of Birth/Gender: 11/06/1950 (65 y.o. Female) Treating RN: Montey Hora Primary Care Physician: Dicky Doe Other Clinician: Referring  Physician: Fredia Sorrow Treating Physician/Extender: Frann Rider in Treatment: 0 Education Assessment Education Provided To: Patient and Caregiver Education Topics Provided Basic Hygiene: Handouts: Other: skin care Methods: Explain/Verbal Responses: State content correctly Wound/Skin Impairment: Electronic Signature(s) Signed: 01/24/2016 5:53:50 PM By: Montey Hora Entered By: Montey Hora on 01/24/2016 08:47:24 Weidemann, Consuello Masse ([id number]) -------------------------------------------------------------------------------- Wound Assessment Details Patient Name: Joanna Phillips. Date of Service: 01/24/2016 8:00 AM Medical Record Number: [id number] Patient Account Number: [id number] Date of Birth/Sex: January 22, 1951 (65 y.o. Female) Treating RN: Montey Hora Primary Care Physician: Dicky Doe Other Clinician: Referring Physician: Fredia Sorrow Treating Physician/Extender: Frann Rider in Treatment: 0 Wound Status Wound Number: 1 Primary Etiology: Infection - not elsewhere classified Wound Location: Midline Head - Parietal Wound Status: Healed - Epithelialized Wounding Event: Blister Date Acquired: 12/17/2015 Weeks Of Treatment: 0 Clustered Wound: Yes Wound Measurements Length: (cm) Width: (cm) Depth: (cm) Area: (cm) Volume: (cm) 0 % Reduction in Area: 0 % Reduction in Volume: 0 0 0 Periwound Skin Texture Texture Color No Abnormalities Noted: No No Abnormalities Noted: No Moisture No Abnormalities Noted: No Electronic Signature(s) Signed: 01/24/2016 5:53:50 PM By: Montey Hora Entered By: Montey Hora on 01/24/2016 08:42:56 Manas, Consuello Masse ([id number]) -------------------------------------------------------------------------------- Vitals Details Patient Name: Joanna Phillips. Date of Service: 01/24/2016 8:00 AM Medical Record Number: [id number] Patient Account Number: [id number] Date of Birth/Sex: 1950-10-06 (65 y.o. Female) Treating RN: Montey Hora Primary Care Physician: Dicky Doe Other Clinician: Referring Physician: Fredia Sorrow Treating Physician/Extender: Frann Rider in Treatment: 0 Vital Signs Time Taken: 08:16 Temperature (F): 97.6 Height (in): 66 Pulse (bpm): 88 Source: Stated Respiratory Rate (breaths/min): 18 Weight (lbs): 200 Blood Pressure (mmHg): 120/72 Source: Stated Reference Range: 80 - 120 mg / dl Body Mass Index (BMI): 32.3 Electronic Signature(s) Signed: 01/24/2016 5:53:50 PM By: Montey Hora Entered By: Montey Hora on 01/24/2016 08:16:48

## 2016-01-25 NOTE — Progress Notes (Signed)
ANIIYAH, TROUTEN ([id number]) Visit Report for 01/24/2016 Abuse/Suicide Risk Screen Details Patient Name: Joanna Phillips, Joanna Phillips. Date of Service: 01/24/2016 8:00 AM Medical Record Number: [id number] Patient Account Number: [id number] Date of Birth/Sex: 11-12-1950 (65 y.o. Female) Treating RN: Montey Hora Primary Care Physician: Dicky Doe Other Clinician: Referring Physician: Fredia Sorrow Treating Physician/Extender: Frann Rider in Treatment: 0 Abuse/Suicide Risk Screen Items Answer ABUSE/SUICIDE RISK SCREEN: Has anyone close to you tried to hurt or harm you recentlyo No Do you feel uncomfortable with anyone in your familyo No Has anyone forced you do things that you didnot want to doo No Do you have any thoughts of harming yourselfo No Patient displays signs or symptoms of abuse and/or neglect. No Electronic Signature(s) Signed: 01/24/2016 5:53:50 PM By: Montey Hora Entered By: Montey Hora on 01/24/2016 08:20:06 Ticer, Consuello Masse ([id number]) -------------------------------------------------------------------------------- Activities of Daily Living Details Patient Name: PEARLY, [name]. Date of Service: 01/24/2016 8:00 AM Medical Record Number: [id number] Patient Account Number: [id number] Date of Birth/Sex: February 16, 1951 (65 y.o. Female) Treating RN: Montey Hora Primary Care Physician: Dicky Doe Other Clinician: Referring Physician: Fredia Sorrow Treating Physician/Extender: Frann Rider in Treatment: 0 Activities of Daily Living Items Answer Activities of Daily Living (Please select one for each item) Drive Automobile Not Able Take Medications Completely Able Use Telephone Completely Able Care for Appearance Completely Able Use Toilet Completely Able Bath / Shower Completely Able Dress Self Completely Able Feed Self Completely Able Walk Completely Able Get In / Out Bed Completely Able Housework Completely Able Prepare Meals Completely Able Handle  Money Completely Able Shop for Self Completely Able Electronic Signature(s) Signed: 01/24/2016 5:53:50 PM By: Montey Hora Entered By: Montey Hora on 01/24/2016 08:20:27 Lemon, Consuello Masse ([id number]) -------------------------------------------------------------------------------- Education Assessment Details Patient Name: [name]. Date of Service: 01/24/2016 8:00 AM Medical Record Number: [id number] Patient Account Number: [id number] Date of Birth/Sex: [birth year] (65 y.o. Female) Treating RN: Montey Hora Primary Care Physician: Dicky Doe Other Clinician: Referring Physician: Fredia Sorrow Treating Physician/Extender: Frann Rider in Treatment: 0 Primary Learner Assessed: Caregiver Reason Patient is not Primary Learner: wound location Learning Preferences/Education Level/Primary Language Learning Preference: Explanation, Demonstration Highest Education Level: College or Above Preferred Language: English Cognitive Barrier Assessment/Beliefs Language Barrier: No Translator Needed: No Memory Deficit: No Emotional Barrier: No Cultural/Religious Beliefs Affecting Medical No Care: Physical Barrier Assessment Impaired Vision: No Impaired Hearing: No Decreased Hand dexterity: No Knowledge/Comprehension Assessment Knowledge Level: Medium Comprehension Level: Medium Ability to understand written Medium instructions: Ability to understand verbal Medium instructions: Motivation Assessment Anxiety Level: Calm Cooperation: Cooperative Education Importance: Acknowledges Need Interest in Health Problems: Asks Questions Perception: Coherent Willingness to Engage in Self- Medium Management Activities: Readiness to Engage in Self- Medium Management Activities: TANEKIA, RUGEL ([id number]) Electronic Signature(s) Signed: 01/24/2016 5:53:50 PM By: Montey Hora Entered By: Montey Hora on 01/24/2016 08:20:54 Santarelli, Consuello Masse  ([id number]) -------------------------------------------------------------------------------- Fall Risk Assessment Details Patient Name: [name] Date of Service: 01/24/2016 8:00 AM Medical Record Number: [id number] Patient Account Number: [id number] Date of Birth/Sex: 04-Jul-1950 (65 y.o. Female) Treating RN: Montey Hora Primary Care Physician: Dicky Doe Other Clinician: Referring Physician: Fredia Sorrow Treating Physician/Extender: Frann Rider in Treatment: 0 Fall Risk Assessment Items Have you had 2 or more falls in the last 12 monthso 0 No Have you had any fall that resulted in injury in the last 12 monthso 0 No FALL RISK ASSESSMENT: History of falling - immediate or within 3 months 0 No Secondary diagnosis  0 No Ambulatory aid None/bed rest/wheelchair/nurse 0 Yes Crutches/cane/walker 0 No Furniture 0 No IV Access/Saline Lock 0 No Gait/Training Normal/bed rest/immobile 0 No Weak 10 Yes Impaired 0 No Mental Status Oriented to own ability 0 Yes Electronic Signature(s) Signed: 01/24/2016 5:53:50 PM By: Montey Hora Entered By: Montey Hora on 01/24/2016 08:21:34 Hollenkamp, Consuello Masse ([id number]) -------------------------------------------------------------------------------- Nutrition Risk Assessment Details Patient Name: [name]. Date of Service: 01/24/2016 8:00 AM Medical Record Number: [id number] Patient Account Number: [id number] Date of Birth/Sex: 1950-09-26 (65 y.o. Female) Treating RN: Montey Hora Primary Care Physician: Dicky Doe Other Clinician: Referring Physician: Fredia Sorrow Treating Physician/Extender: Frann Rider in Treatment: 0 Height (in): 66 Weight (lbs): 200 Body Mass Index (BMI): 32.3 Nutrition Risk Assessment Items NUTRITION RISK SCREEN: I have an illness or condition that made me change the kind and/or 0 No amount of food I eat I eat fewer than two meals per day 0 No I eat few fruits and vegetables, or  milk products 0 No I have three or more drinks of beer, liquor or wine almost every day 0 No I have tooth or mouth problems that make it hard for me to eat 0 No I don't always have enough money to buy the food I need 0 No I eat alone most of the time 0 No I take three or more different prescribed or over-the-counter drugs a 1 Yes day Without wanting to, I have lost or gained 10 pounds in the last six 0 No months I am not always physically able to shop, cook and/or feed myself 0 No Nutrition Protocols Good Risk Protocol 0 No interventions needed Moderate Risk Protocol Electronic Signature(s) Signed: 01/24/2016 5:53:50 PM By: Montey Hora Entered By: Montey Hora on 01/24/2016 08:21:19

## 2016-01-26 NOTE — Progress Notes (Addendum)
Joanna Phillips, Joanna Phillips ([id number]) Visit Report for 01/24/2016 Chief Complaint Document Details Patient Name: Joanna Phillips, Joanna Phillips. Date of Service: 01/24/2016 8:00 AM Medical Record Number: [id number] Patient Account Number: [id number] Date of Birth/Sex: 27-Jun-1950 (65 y.o. Female) Treating RN: Montey Hora Primary Care Physician: Dicky Doe Other Clinician: Referring Physician: Fredia Sorrow Treating Physician/Extender: Frann Rider in Treatment: 0 Information Obtained from: Patient Chief Complaint Patient presents to the wound care center for a consult due non healing wound of the scalp due to disseminated herpes zoster which she's had for about 5 weeks Electronic Signature(s) Signed: 01/24/2016 8:48:48 AM By: Christin Fudge MD, FACS Entered By: Christin Fudge on 01/24/2016 08:48:47 [place], Joanna Phillips ([id number]) -------------------------------------------------------------------------------- HPI Details Patient Name: Joanna Phillips. Date of Service: 01/24/2016 8:00 AM Medical Record Number: [id number] Patient Account Number: [id number] Date of Birth/Sex: [birth year] (65 y.o. Female) Treating RN: Montey Hora Primary Care Physician: Dicky Doe Other Clinician: Referring Physician: Fredia Sorrow Treating Physician/Extender: Frann Rider in Treatment: 0 History of Present Illness Location: painful lesions on the scalp for about 5 weeks Quality: Patient reports experiencing a sharp pain to affected area(s). Severity: Patient states wound are getting worse. 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 She saw infectious disease Dr. Ola Spurr who did not have any specific Recommendations for this. most recently she was hospitalized in [facility] from August 4 to August 8 and was even seen by ophthalmology. She had received IV acyclovir and prednisone. He was initially controlled with gabapentin. due to diagnosis of parotitis and periorbital cellulitis she was treated with cefepime and clindamycin in the past. Medical history significant for arthritis, CLL, GERD, status post cholecystectomy and colonoscope E. She has not been a smoker. Electronic Signature(s) Signed: 01/24/2016 8:56:15 AM By: Christin Fudge MD, FACS Entered By: Christin Fudge on 01/24/2016 08:56:14 Joanna Phillips, Joanna Phillips ([id number]) -------------------------------------------------------------------------------- Physical Exam Details Patient Name: Joanna Phillips Date of Service: 01/24/2016 8:00 AM Medical Record Number: [id number] Patient Account Number: [id number] Date of Birth/Sex: 05/02/1951 (65 y.o. Female) Treating RN: Montey Hora Primary Care Physician: Dicky Doe Other Clinician: Referring Physician: Fredia Sorrow Treating Physician/Extender: Frann Rider in Treatment: 0 Constitutional . Pulse regular. Respirations normal and unlabored. Afebrile. . Eyes Nonicteric. Reactive to light. Ears, Nose, Mouth, and Throat Lips, teeth, and gums WNL.Marland Kitchen Moist mucosa without lesions. Neck supple and nontender. No palpable supraclavicular or cervical adenopathy. Normal sized without goiter. Respiratory WNL. No retractions.. Cardiovascular Pedal Pulses WNL. No clubbing, cyanosis or edema. Gastrointestinal (GI) Abdomen without masses or tenderness.. No liver or spleen enlargement or tenderness.. Lymphatic No adneopathy. No adenopathy. No  adenopathy. Musculoskeletal Adexa without tenderness or enlargement.. Digits and nails w/o clubbing, cyanosis, infection, petechiae, ischemia, or inflammatory conditions.. Integumentary (Hair, Skin) No suspicious lesions. No crepitus or fluctuance. No peri-wound warmth or erythema. No masses.Marland Kitchen Psychiatric Judgement and insight Intact.. No evidence of depression, anxiety, or agitation.. Notes on the scalp she has matted tender lesions which are in 2 different dermatomes  and extremely tender to touch. No debridement is possible. Electronic Signature(s) Signed: 01/24/2016 8:57:00 AM By: Christin Fudge MD, FACS Entered By: Christin Fudge on 01/24/2016 08:57:00 Joanna Phillips, Joanna Phillips ([id number]) -------------------------------------------------------------------------------- Physician Orders Details Patient Name: Joanna Phillips Date of Service: 01/24/2016 8:00 AM Medical Record Number: [id number] Patient Account Number: [id number] Date of Birth/Sex: 01-Dec-1950 (65 y.o. Female) Treating RN: Montey Hora Primary Care Physician: Dicky Doe Other Clinician: Referring Physician: Fredia Sorrow Treating Physician/Extender: Frann Rider in Treatment: 0 Verbal / Phone Orders: Yes Clinician: Montey Hora Read Back and Verified: Yes Diagnosis Coding Discharge From [facility] o Discharge from [place] Only. Patient Medications Allergies: hydrocodone, amoxicillin, Septra, allopurinol Notifications Medication Indication Start End lidocaine HCl 01/24/2016 DOSE topical 4 % gel - gel topical twice a day sparingly Electronic Signature(s) Signed: 01/25/2016 11:29:33 AM By: Regan Lemming BSN, RN Signed: 01/25/2016 1:43:20 PM By: Christin Fudge MD, FACS Previous Signature: 01/24/2016 8:50:48 AM Version By: Christin Fudge MD, FACS Entered By: Regan Lemming on 01/25/2016 11:29:33 Joanna Phillips, Joanna Phillips  ([id number]) -------------------------------------------------------------------------------- Problem List Details Patient Name: Joanna Phillips, Joanna Phillips. Date of Service: 01/24/2016 8:00 AM Medical Record Number: [id number] Patient Account Number: [id number] Date of Birth/Sex: [birth year] (65 y.o. Female) Treating RN: Montey Hora Primary Care Physician: Dicky Doe Other Clinician: Referring Physician: Fredia Sorrow Treating Physician/Extender: Frann Rider in Treatment: 0 Active Problems ICD-10 Encounter Code Description Active Date Diagnosis S01.00XA Unspecified open wound of scalp, initial encounter 01/24/2016 Yes B02.7 Disseminated zoster 01/24/2016 Yes Inactive Problems Resolved Problems Electronic Signature(s) Signed: 01/24/2016 8:48:18 AM By: Christin Fudge MD, FACS Entered By: Christin Fudge on 01/24/2016 08:48:18 Joanna Phillips, Joanna Phillips ([id number]) -------------------------------------------------------------------------------- Progress Note Details Patient Name: Joanna Phillips. Date of Service: 01/24/2016 8:00 AM Medical Record Number: [id number] Patient Account Number: [id number] Date of Birth/Sex: 06/02/1950 (65 y.o. Female) Treating RN: Montey Hora Primary Care Physician: Dicky Doe Other Clinician: Referring Physician: Fredia Sorrow Treating Physician/Extender: Frann Rider in Treatment: 0 Subjective Chief Complaint Information obtained from Patient Patient presents to the wound care center for a consult due non healing wound of the scalp due to disseminated herpes zoster which she's had for about 5 weeks History of Present Illness (HPI) The following HPI elements were documented for the patient's wound: Location: painful lesions on the scalp for about 5 weeks Quality: Patient reports experiencing a sharp pain to affected area(s). Severity: Patient states wound are getting worse. 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 She saw infectious disease Dr. Ola Spurr who did not have any specific Recommendations for this. most recently she was hospitalized in [facility] from August 4 to August 8 and was even seen by ophthalmology. She had received IV acyclovir and prednisone. He was initially controlled with gabapentin. due to diagnosis of parotitis and periorbital cellulitis she was treated with cefepime and clindamycin in the past. Medical history significant for arthritis, CLL, GERD, status post cholecystectomy and colonoscope E. She has not been a smoker. Wound History  Patient presents with 1 open wound that has been present for approximately July 31st. Patient has been treating wound in the following manner: shingles medications. Laboratory tests have not been performed in the last month. Patient reportedly has not tested positive for an antibiotic resistant organism. Patient reportedly has not tested positive for osteomyelitis. Patient reportedly has had testing performed to evaluate circulation in the legs. Patient History Information obtained from Patient. SMT, STIDAM ([id number]) Allergies hydrocodone, amoxicillin, Septra, allopurinol Social History Never smoker, Marital Status - Married, Alcohol Use - Never, Drug Use - No History, Caffeine Use - Daily. Medical History Oncologic Denies history of Received Chemotherapy, Received Radiation Medical And Surgical History  Notes Gastrointestinal diverticulitis - Dr Candace Cruise in 2013 or 2015 Musculoskeletal arthritis Oncologic CLL - being monitored only Review of Systems (ROS) Constitutional Symptoms ([place]) The patient has no complaints or symptoms. Eyes Complains or has symptoms of Glasses / Contacts - glasses. Ear/Nose/Mouth/Throat The patient has no complaints or symptoms. Hematologic/Lymphatic The patient has no complaints or symptoms. Respiratory The patient has no complaints or symptoms. Cardiovascular The patient has no complaints or symptoms. Gastrointestinal The patient has no complaints or symptoms. Endocrine The patient has no complaints or symptoms. Genitourinary The patient has no complaints or symptoms. Immunological The patient has no complaints or symptoms. Integumentary (Skin) The patient has no complaints or symptoms. Musculoskeletal The patient has no complaints or symptoms. Neurologic The patient has no complaints or symptoms. Psychiatric The patient has no complaints or symptoms. Joanna Phillips, Joanna Phillips ([id number]) Medications gabapentin 300 mg capsule oral 1 1 capsule oral meclizine 25 mg tablet oral 1 1 tablet oral loratadine 10 mg tablet oral 1 1 tablet oral valacyclovir 500 mg tablet oral 1 1 tablet oral daily calcium carbonate-vitamin D3 600 mg (1,500 mg)-1,000 unit tablet oral tablet oral fluticasone 50 mcg/actuation nasal spray,suspension nasal spray,suspension nasal two sprays to both nostrils once daily ibuprofen 800 mg tablet oral 1 1 tablet oral daily omeprazole 20 mg capsule,delayed release oral 1 1 capsule,delayed release(DR/EC) oral daily lidocaine HCl 4 % topical gel topical gel topical twice a day sparingly amitriptyline 25 mg tablet oral 1 1 tablet oral nightly Objective Constitutional Pulse regular. Respirations normal and unlabored. Afebrile. Vitals Time Taken: 8:16 AM, Height: 66 in, Source: Stated, Weight: 200 lbs, Source: Stated, BMI:  32.3, Temperature: 97.6 F, Pulse: 88 bpm, Respiratory Rate: 18 breaths/min, Blood Pressure: 120/72 mmHg. Eyes Nonicteric. Reactive to light. Ears, Nose, Mouth, and Throat Lips, teeth, and gums WNL.Marland Kitchen Moist mucosa without lesions. Neck supple and nontender. No palpable supraclavicular or cervical adenopathy. Normal sized without goiter. Respiratory WNL. No retractions.. Cardiovascular Pedal Pulses WNL. No clubbing, cyanosis or edema. Gastrointestinal (GI) Abdomen without masses or tenderness.. No liver or spleen enlargement or tenderness.. Lymphatic No adneopathy. No adenopathy. No adenopathy. Musculoskeletal Bottcher, VELETA GUCK ([id number]) Adexa without tenderness or enlargement.. Digits and nails w/o clubbing, cyanosis, infection, petechiae, ischemia, or inflammatory conditions.Marland Kitchen Psychiatric Judgement and insight Intact.. No evidence of depression, anxiety, or agitation.. General Notes: on the scalp she has matted tender lesions which are in 2 different dermatomes and extremely tender to touch. No debridement is possible. Integumentary (Hair, Skin) No suspicious lesions. No crepitus or fluctuance. No peri-wound warmth or erythema. No masses.. Wound #1 status is Healed - Epithelialized. Original cause of wound was Blister. The wound is located on the Midline Head - Parietal. The wound measures 0cm length x 0cm width x 0cm depth; 0cm^2 area and 0cm^3 volume. Assessment Active Problems ICD-10 S01.00XA -  Unspecified open wound of scalp, initial encounter B02.7 - Disseminated zoster After review of the patient condition I have had a thorough discussion with the patient and her husband who was at the bedside. Unfortunately we do not have the option of debriding this wound, and there is no easy solution to her pain management. I have recommended: 1. washing the scalp with soap and water and if tolerable to shave some of the hair or clip it to have easy access to local medications. 2.  applying lidocaine 4% gel sparingly to this twice a day to relieve some of the pain. 3. Contuning with recommendations of her medical oncologist and PCP. Plan [facility]charge From [facility]: Discharge from [place] Only. The following medication(s) was prescribed: lidocaine HCl topical 4 % gel gel topical twice a day sparingly Shontz, MARILEE LOUGHREY. ([id number]) starting 01/24/2016 After review of the patient condition I have had a thorough discussion with the patient and her husband who was at the bedside. Unfortunately we do not have the option of debriding this wound, and there is no easy solution to her pain management. I have recommended: 1. washing the scalp with soap and water and if tolerable to shave some of the hair or clip it to have easy access to local medications. 2. applying lidocaine 4% gel sparingly to this twice a day to relieve some of the pain. 3. Contuning with recommendations of her medical oncologist and PCP. Electronic Signature(s) Signed: 01/25/2016 1:45:21 PM By: Christin Fudge MD, FACS Previous Signature: 01/24/2016 9:00:47 AM Version By: Christin Fudge MD, FACS Entered By: Christin Fudge on 01/25/2016 13:45:21 Joanna Phillips, Joanna Phillips ([id number]) -------------------------------------------------------------------------------- ROS/PFSH Details Patient Name: Joanna Phillips Date of Service: 01/24/2016 8:00 AM Medical Record Number: [id number] Patient Account Number: [id number] Date of Birth/Sex: [birth year] (65 y.o. Female) Treating RN: Montey Hora Primary Care Physician: Dicky Doe Other Clinician: Referring Physician: Fredia Sorrow Treating Physician/Extender: Frann Rider in Treatment: 0 Information Obtained From Patient Wound History Do you currently have one or more open woundso Yes How many open wounds do you currently haveo 1 Approximately how long have you had your woundso July 31st How have you been treating your wound(s) until nowo shingles  medications Has your wound(s) ever healed and then re-openedo No Have you had any lab work done in the past montho No Have you tested positive for an antibiotic resistant organism (MRSA, VRE)o No Have you tested positive for osteomyelitis (bone infection)o No Have you had any tests for circulation on your legso Yes Who ordered the testo PCP Where was the test doneo Dr Jamal Collin Eyes Complaints and Symptoms: Positive for: Glasses / Contacts - glasses Constitutional Symptoms (General Health) Complaints and Symptoms: No Complaints or Symptoms Ear/Nose/Mouth/Throat Complaints and Symptoms: No Complaints or Symptoms Hematologic/Lymphatic Complaints and Symptoms: No Complaints or Symptoms Respiratory Complaints and Symptoms: No Complaints or Symptoms Cardiovascular Dresden, LYRIS AMPARANO ([id number]) Complaints and Symptoms: No Complaints or Symptoms Gastrointestinal Complaints and Symptoms: No Complaints or Symptoms Medical History: Past Medical History Notes: diverticulitis - Dr Candace Cruise in 2013 or 2015 Endocrine Complaints and Symptoms: No Complaints or Symptoms Genitourinary Complaints and Symptoms: No Complaints or Symptoms Immunological Complaints and Symptoms: No Complaints or Symptoms Integumentary (Skin) Complaints and Symptoms: No Complaints or Symptoms Musculoskeletal Complaints and Symptoms: No Complaints or Symptoms Medical History: Past Medical History Notes: arthritis Neurologic Complaints and Symptoms: No Complaints or Symptoms Oncologic Medical History: Negative for: Received Chemotherapy; Received Radiation Past Medical History Notes: CLL - being  monitored only Joanna Phillips, Joanna Phillips ([id number]) Psychiatric Complaints and Symptoms: No Complaints or Symptoms Immunizations Pneumococcal Vaccine: Received Pneumococcal Vaccination: No Immunization Notes: up to date Family and Social History Never smoker; Marital Status - Married; Alcohol Use: Never; Drug Use: No  History; Caffeine Use: Daily; Financial Concerns: No; Food, Clothing or Shelter Needs: No; Support System Lacking: No; Transportation Concerns: No; Advanced Directives: No; Patient does not want information on Advanced Directives Physician Affirmation I have reviewed and agree with the above information. Electronic Signature(s) Signed: 01/24/2016 4:26:59 PM By: Christin Fudge MD, FACS Signed: 01/24/2016 5:53:50 PM By: Montey Hora Entered By: Christin Fudge on 01/24/2016 08:30:34 [place], Joanna Phillips ([id number]) -------------------------------------------------------------------------------- SuperBill Details Patient Name: Joanna Phillips. Date of Service: 01/24/2016 Medical Record Number: [id number] Patient Account Number: [id number] Date of Birth/Sex: [birth year] (66 y.o. Female) Treating RN: Montey Hora Primary Care Physician: Dicky Doe Other Clinician: Referring Physician: Fredia Sorrow Treating Physician/Extender: Frann Rider in Treatment: 0 Diagnosis Coding ICD-10 Codes Code Description S01.00XA Unspecified open wound of scalp, initial encounter B02.7 Disseminated zoster Facility Procedures CPT4 Code: [id number] Description: 99213 - WOUND CARE VISIT-LEV 3 EST PT Modifier: Quantity: 1 Physician Procedures CPT4 Code: [id number] Description: WC PHYS LEVEL 3 o NEW PT ICD-10 Description Diagnosis S01.00XA Unspecified open wound of scalp, initial enco B02.7 Disseminated zoster Modifier: unter Quantity: 1 Electronic Signature(s) Signed: 01/24/2016 9:01:08 AM By: Christin Fudge MD, FACS Entered By: Christin Fudge on 01/24/2016 09:01:08

## 2016-01-31 LAB — BASIC METABOLIC PANEL
BUN / CREAT RATIO: 22 (ref 12–28)
BUN: 21 mg/dL (ref 8–27)
CALCIUM: 9 mg/dL (ref 8.7–10.3)
CHLORIDE: 101 mmol/L (ref 96–106)
CO2: 27 mmol/L (ref 18–29)
Creatinine, Ser: 0.97 mg/dL (ref 0.57–1.00)
GFR calc non Af Amer: 61 mL/min/{1.73_m2} (ref >59)
GFR, EST AFRICAN AMERICAN: 71 mL/min/{1.73_m2} (ref >59)
GLUCOSE: 114 mg/dL — AB (ref 65–99)
POTASSIUM: 4.3 mmol/L (ref 3.5–5.2)
Sodium: 139 mmol/L (ref 134–144)

## 2016-02-04 ENCOUNTER — Ambulatory Visit
Admission: RE | Admit: 2016-02-04 | Discharge: 2016-02-04 | Disposition: A | Discharge status: Home / Self Care | Payer: 59 | Source: Ambulatory Visit | Attending: Obstetrics and Gynecology | Admitting: Obstetrics and Gynecology

## 2016-02-04 ENCOUNTER — Other Ambulatory Visit: Payer: Self-pay | Admitting: Obstetrics and Gynecology

## 2016-02-04 DIAGNOSIS — R920 Mammographic microcalcification found on diagnostic imaging of breast: Secondary | ICD-10-CM

## 2016-02-04 DIAGNOSIS — R921 Mammographic calcification found on diagnostic imaging of breast: Secondary | ICD-10-CM | POA: Diagnosis present

## 2016-02-04 DIAGNOSIS — N63 Unspecified lump in unspecified breast: Secondary | ICD-10-CM

## 2016-02-06 ENCOUNTER — Ambulatory Visit: Payer: 59

## 2016-02-06 ENCOUNTER — Ambulatory Visit
Admission: RE | Admit: 2016-02-06 | Discharge: 2016-02-06 | Disposition: A | Discharge status: Home / Self Care | Payer: 59 | Source: Ambulatory Visit | Attending: Obstetrics and Gynecology | Admitting: Obstetrics and Gynecology

## 2016-02-11 ENCOUNTER — Other Ambulatory Visit: Payer: Self-pay | Admitting: Unknown Physician Specialty

## 2016-02-11 DIAGNOSIS — G523 Disorders of hypoglossal nerve: Secondary | ICD-10-CM

## 2016-02-11 DIAGNOSIS — B029 Zoster without complications: Secondary | ICD-10-CM

## 2016-02-11 DIAGNOSIS — R531 Weakness: Secondary | ICD-10-CM

## 2016-02-12 ENCOUNTER — Ambulatory Visit
Admission: RE | Admit: 2016-02-12 | Discharge: 2016-02-12 | Disposition: A | Discharge status: Home / Self Care | Payer: 59 | Source: Ambulatory Visit | Attending: Obstetrics and Gynecology | Admitting: Obstetrics and Gynecology

## 2016-02-12 DIAGNOSIS — D0511 Intraductal carcinoma in situ of right breast: Secondary | ICD-10-CM | POA: Insufficient documentation

## 2016-02-12 DIAGNOSIS — R921 Mammographic calcification found on diagnostic imaging of breast: Secondary | ICD-10-CM | POA: Insufficient documentation

## 2016-02-12 DIAGNOSIS — R920 Mammographic microcalcification found on diagnostic imaging of breast: Secondary | ICD-10-CM

## 2016-02-12 HISTORY — PX: BREAST BIOPSY: SHX20

## 2016-02-13 ENCOUNTER — Telehealth: Payer: Self-pay | Admitting: Family Medicine

## 2016-02-13 ENCOUNTER — Other Ambulatory Visit: Payer: Self-pay | Admitting: Family Medicine

## 2016-02-13 ENCOUNTER — Ambulatory Visit (INDEPENDENT_AMBULATORY_CARE_PROVIDER_SITE_OTHER): Payer: 59 | Admitting: Family Medicine

## 2016-02-13 VITALS — BP 113/55 | HR 95 | Temp 98.4°F | Resp 16 | Ht 65.0 in | Wt 199.0 lb

## 2016-02-13 DIAGNOSIS — Z23 Encounter for immunization: Secondary | ICD-10-CM | POA: Diagnosis not present

## 2016-02-13 DIAGNOSIS — B0229 Other postherpetic nervous system involvement: Secondary | ICD-10-CM

## 2016-02-13 DIAGNOSIS — N189 Chronic kidney disease, unspecified: Secondary | ICD-10-CM | POA: Insufficient documentation

## 2016-02-13 DIAGNOSIS — B019 Varicella without complication: Secondary | ICD-10-CM

## 2016-02-13 DIAGNOSIS — K1120 Sialoadenitis, unspecified: Secondary | ICD-10-CM | POA: Diagnosis not present

## 2016-02-13 DIAGNOSIS — B029 Zoster without complications: Secondary | ICD-10-CM

## 2016-02-13 DIAGNOSIS — N183 Chronic kidney disease, stage 3 (moderate): Secondary | ICD-10-CM | POA: Diagnosis not present

## 2016-02-13 LAB — SURGICAL PATHOLOGY

## 2016-02-13 MED ORDER — GABAPENTIN 300 MG PO CAPS: 600.0000 mg | ORAL_CAPSULE | Freq: Three times a day (TID) | ORAL | 5 refills | Status: DC

## 2016-02-13 MED ORDER — AMITRIPTYLINE HCL 50 MG PO TABS: 50.0000 mg | ORAL_TABLET | Freq: Every day | ORAL | 11 refills | Status: DC

## 2016-02-13 NOTE — Assessment & Plan Note (Signed)
Continue gabapentin at current dosing. Increase amitriptyline to 50mg . Consider lyrica. Recheck 2 weeks.

## 2016-02-13 NOTE — Telephone Encounter (Signed)
Called [facility] [employer] to extend FMLA to 10/22- they are faxing over paperwork. Form needs to be in by 10/8

## 2016-02-13 NOTE — Assessment & Plan Note (Signed)
MRI scheduled by ENT for tongue swelling. Planned follow-up for neurology.

## 2016-02-13 NOTE — Assessment & Plan Note (Signed)
Healing lesions along the scalp are improving. Recommend vaseline on lesions. Recommend not apply capsaicin cream to open lesions.

## 2016-02-13 NOTE — Patient Instructions (Addendum)
Let's try increasing your amitriptyline to 50mg  once daily.  Take 1 and 1/2 25mg  pills for 1 week and then start taking 2. When you run out the new 50mg  pill will be there.   Apply Vaseline to open wounds on the head.

## 2016-02-13 NOTE — Progress Notes (Signed)
Subjective:    Patient ID: Joanna Phillips, female    DOB: Aug 26, 1950, 65 y.o.   MRN: [id number]  HPI: Joanna Phillips is a 65 y.o. female presenting on 02/13/2016 for Herpes Zoster (floow up had some pain still has some sore Right side of her head)   HPI  Pt presents for follow-up of disseminated herpes zoster.  She saw ENT for follow-up of tongue swelling and parotitis with the shingles. ENT is ordering an MRI to check out the nerves- thinks it may have caused nerve damage. They want her to see neurology for follow-up.  Scabs on head are coming off. Taking 300mg  TID and taking 600mg  TID (1800mg  total) per day. Pain is a little better with the increased dose. Dr. Ola Spurr added amitriptyline at bedtime - mild effect.   Past Medical History:  Diagnosis Date  . Arthritis   . CLL (chronic lymphocytic leukemia) ([place]) 2011  . GERD (gastroesophageal reflux disease)     Current Outpatient Prescriptions on File Prior to Visit  Medication Sig  . Calcium Carbonate-Vitamin D (CALCIUM + D PO) Take 1 tablet by mouth daily.  . capsaicin (ZOSTRIX) 0.025 % cream Apply topically 2 (two) times daily.  . ciprofloxacin (CIPRO) 250 MG tablet Take 1 tablet (250 mg total) by mouth 2 (two) times daily.  . diphenhydrAMINE (BENADRYL) 25 MG tablet Take 1 tablet (25 mg total) by mouth every 6 (six) hours as needed.  . fluticasone (FLONASE) 50 MCG/ACT nasal spray Place into both nostrils as needed.   . hydrOXYzine (ATARAX/VISTARIL) 25 MG tablet Take 1 twice a day and 1 or 2 at night as needed.  Marland Kitchen ibuprofen (ADVIL,MOTRIN) 800 MG tablet Take 1 tablet by mouth two  times daily as needed  . loratadine (CLARITIN) 10 MG tablet Take 1 tablet (10 mg total) by mouth daily.  . meclizine (ANTIVERT) 25 MG tablet TAKE 1 TABLET (25 MG TOTAL) BY MOUTH 3 (THREE) TIMES DAILY AS NEEDED FOR DIZZINESS.  Marland Kitchen omeprazole (PRILOSEC) 20 MG capsule Take 1 capsule by mouth two times daily  . oxyCODONE-acetaminophen (PERCOCET/ROXICET) 5-325  MG tablet Take 1 tablet by mouth every 8 (eight) hours as needed for severe pain.  . valACYclovir (VALTREX) 500 MG tablet Take 1 tablet (500 mg total) by mouth 2 (two) times daily.   No current facility-administered medications on file prior to visit.     Review of Systems  Constitutional: Negative for chills and fever.  HENT:       Tongue swelling.   Respiratory: Negative for cough, chest tightness and wheezing.   Cardiovascular: Negative for chest pain and leg swelling.  Gastrointestinal: Negative for abdominal pain, constipation, diarrhea, nausea and vomiting.  Endocrine: Negative.  Negative for cold intolerance, heat intolerance, polydipsia, polyphagia and polyuria.  Genitourinary: Negative for difficulty urinating and dysuria.  Musculoskeletal: Negative.   Skin: Positive for wound.  Neurological: Positive for headaches. Negative for dizziness, light-headedness and numbness.  Psychiatric/Behavioral: Negative.    Per HPI unless specifically indicated above     Objective:    BP (!) 113/55   Pulse 95   Temp 98.4 F (36.9 C) (Oral)   Resp 16   Ht 5\' 5"  (1.651 m)   Wt 199 lb (90.3 kg)   BMI 33.12 kg/m   Wt Readings from Last 3 Encounters:  02/13/16 199 lb (90.3 kg)  01/18/16 200 lb 9.6 oz (91 kg)  01/17/16 199 lb 11.8 oz (90.6 kg)    Physical Exam  Constitutional: She  is oriented to person, place, and time. She appears well-developed and well-nourished.  HENT:  Head: Normocephalic and atraumatic.  R tongue swelling is still present.   Neck: Neck supple.  Cardiovascular: Normal rate, regular rhythm and normal heart sounds.  Exam reveals no gallop and no friction rub.   No murmur heard. Pulmonary/Chest: Effort normal and breath sounds normal. She has no wheezes. She exhibits no tenderness.  Abdominal: Soft. Normal appearance and bowel sounds are normal. She exhibits no distension and no mass. There is no tenderness. There is no rebound and no guarding.  Musculoskeletal:  Normal range of motion. She exhibits no edema or tenderness.  Lymphadenopathy:    She has no cervical adenopathy.  Neurological: She is alert and oriented to person, place, and time.  Skin: Skin is warm and dry. Abrasion noted.  Scabbing has cleared along scalp- pink tissue present with 3 red crusted open areas.     Results for orders placed or performed in visit on 0000000  Basic Metabolic Panel (BMET)  Result Value Ref Range   Glucose 114 (H) 65 - 99 mg/dL   BUN 21 8 - 27 mg/dL   Creatinine, Ser 0.97 0.57 - 1.00 mg/dL   GFR calc non Af Amer 61 >59 mL/min/1.73   GFR calc Af Amer 71 >59 mL/min/1.73   BUN/Creatinine Ratio 22 12 - 28   Sodium 139 134 - 144 mmol/L   Potassium 4.3 3.5 - 5.2 mmol/L   Chloride 101 96 - 106 mmol/L   CO2 27 18 - 29 mmol/L   Calcium 9.0 8.7 - 10.3 mg/dL      Assessment & Plan:   Problem List Items Addressed This Visit      Digestive   Parotitis    MRI scheduled by ENT for tongue swelling. Planned follow-up for neurology.         Nervous and Auditory   Post herpetic neuralgia    Continue gabapentin at current dosing. Increase amitriptyline to 50mg . Consider lyrica. Recheck 2 weeks.       Relevant Medications   gabapentin (NEURONTIN) 300 MG capsule   amitriptyline (ELAVIL) 50 MG tablet     Genitourinary   CKD (chronic kidney disease) - Primary   Relevant Orders   Basic Metabolic Panel (BMET)     Other   Varicella zoster    Healing lesions along the scalp are improving. Recommend vaseline on lesions. Recommend not apply capsaicin cream to open lesions.       Relevant Medications   gabapentin (NEURONTIN) 300 MG capsule    Other Visit Diagnoses    Need for influenza vaccination       Relevant Orders   Flu vaccine HIGH DOSE PF (Fluzone High dose) (Completed)      Meds ordered this encounter  Medications  . DISCONTD: amitriptyline (ELAVIL) 25 MG tablet    Sig: Take by mouth.  . gabapentin (NEURONTIN) 300 MG capsule    Sig: Take 2  capsules (600 mg total) by mouth 3 (three) times daily. Take 1 tablet three times a day    Dispense:  180 capsule    Refill:  5    Order Specific Question:   Supervising Provider    Answer:   Arlis Porta [phone]  . amitriptyline (ELAVIL) 50 MG tablet    Sig: Take 1 tablet (50 mg total) by mouth at bedtime.    Dispense:  30 tablet    Refill:  11    Order Specific Question:  Supervising Provider    Answer:   Arlis Porta [id number]      Follow up plan: Return in about 2 weeks (around 02/27/2016), or if symptoms worsen or fail to improve, for PHN.

## 2016-02-14 LAB — BASIC METABOLIC PANEL
BUN/Creatinine Ratio: 21 (ref 12–28)
BUN: 18 mg/dL (ref 8–27)
CALCIUM: 9.2 mg/dL (ref 8.7–10.3)
CO2: 24 mmol/L (ref 18–29)
CREATININE: 0.87 mg/dL (ref 0.57–1.00)
Chloride: 103 mmol/L (ref 96–106)
GFR calc Af Amer: 81 mL/min/{1.73_m2} (ref >59)
GFR, EST NON AFRICAN AMERICAN: 70 mL/min/{1.73_m2} (ref >59)
Glucose: 93 mg/dL (ref 65–99)
POTASSIUM: 4.6 mmol/L (ref 3.5–5.2)
Sodium: 141 mmol/L (ref 134–144)

## 2016-02-15 ENCOUNTER — Other Ambulatory Visit (HOSPITAL_COMMUNITY): Payer: Self-pay | Admitting: Family Medicine

## 2016-02-15 DIAGNOSIS — B019 Varicella without complication: Secondary | ICD-10-CM

## 2016-02-15 DIAGNOSIS — B029 Zoster without complications: Secondary | ICD-10-CM

## 2016-02-15 MED ORDER — GABAPENTIN 300 MG PO CAPS: 600.0000 mg | ORAL_CAPSULE | Freq: Three times a day (TID) | ORAL | 5 refills | Status: DC

## 2016-02-19 ENCOUNTER — Encounter: Payer: Self-pay | Admitting: Family Medicine

## 2016-02-19 ENCOUNTER — Telehealth: Payer: Self-pay | Admitting: Family Medicine

## 2016-02-19 NOTE — Telephone Encounter (Signed)
Yes have her out through Oct. 23, 2017.-jh

## 2016-02-19 NOTE — Telephone Encounter (Signed)
Joanna Phillips with [employer] needs to confirm how long pt will be out of work.  The pt said until Oct 22nd but the paperwork said to return on Oct 2nd.  Her call back number is [phone] ext [phone]

## 2016-02-19 NOTE — Telephone Encounter (Signed)
Pt husband called and asked if the 23rd could be included in leave because pt has appt at [facility] and mri that day.

## 2016-02-19 NOTE — Telephone Encounter (Signed)
Letter has been written. 

## 2016-02-20 ENCOUNTER — Ambulatory Visit: Payer: 59

## 2016-02-21 ENCOUNTER — Ambulatory Visit
Admission: RE | Admit: 2016-02-21 | Discharge: 2016-02-21 | Disposition: A | Discharge status: Home / Self Care | Payer: 59 | Source: Ambulatory Visit | Attending: Unknown Physician Specialty | Admitting: Unknown Physician Specialty

## 2016-02-21 DIAGNOSIS — B029 Zoster without complications: Secondary | ICD-10-CM | POA: Diagnosis not present

## 2016-02-21 DIAGNOSIS — G523 Disorders of hypoglossal nerve: Secondary | ICD-10-CM | POA: Diagnosis not present

## 2016-02-21 DIAGNOSIS — R531 Weakness: Secondary | ICD-10-CM

## 2016-02-21 MED ORDER — GADOBENATE DIMEGLUMINE 529 MG/ML IV SOLN
20.0000 mL | Freq: Once | INTRAVENOUS | Status: AC | PRN
  Administered 2016-02-21: 18 mL via INTRAVENOUS

## 2016-02-28 ENCOUNTER — Other Ambulatory Visit: Payer: Self-pay | Admitting: Family Medicine

## 2016-02-28 DIAGNOSIS — B019 Varicella without complication: Secondary | ICD-10-CM

## 2016-02-28 DIAGNOSIS — B029 Zoster without complications: Secondary | ICD-10-CM

## 2016-02-28 MED ORDER — GABAPENTIN 300 MG PO CAPS: 600.0000 mg | ORAL_CAPSULE | Freq: Three times a day (TID) | ORAL | 5 refills | Status: DC

## 2016-02-29 ENCOUNTER — Ambulatory Visit: Payer: 59 | Admitting: Family Medicine

## 2016-02-29 ENCOUNTER — Ambulatory Visit (INDEPENDENT_AMBULATORY_CARE_PROVIDER_SITE_OTHER): Payer: 59 | Admitting: Family Medicine

## 2016-02-29 ENCOUNTER — Encounter: Payer: Self-pay | Admitting: Family Medicine

## 2016-02-29 VITALS — BP 111/77 | HR 89 | Temp 97.8°F | Resp 16 | Ht 65.0 in | Wt 200.0 lb

## 2016-02-29 DIAGNOSIS — D0511 Intraductal carcinoma in situ of right breast: Secondary | ICD-10-CM | POA: Diagnosis not present

## 2016-02-29 DIAGNOSIS — B027 Disseminated zoster: Secondary | ICD-10-CM

## 2016-02-29 DIAGNOSIS — Z86000 Personal history of in-situ neoplasm of breast: Secondary | ICD-10-CM | POA: Insufficient documentation

## 2016-02-29 DIAGNOSIS — K219 Gastro-esophageal reflux disease without esophagitis: Secondary | ICD-10-CM | POA: Diagnosis not present

## 2016-02-29 DIAGNOSIS — B0229 Other postherpetic nervous system involvement: Secondary | ICD-10-CM

## 2016-02-29 MED ORDER — VALACYCLOVIR HCL 500 MG PO TABS: 500.0000 mg | ORAL_TABLET | Freq: Two times a day (BID) | ORAL | 5 refills | Status: DC

## 2016-02-29 MED ORDER — OMEPRAZOLE 20 MG PO CPDR: DELAYED_RELEASE_CAPSULE | ORAL | 3 refills | Status: DC

## 2016-02-29 MED ORDER — VALACYCLOVIR HCL 500 MG PO TABS: 500.0000 mg | ORAL_TABLET | Freq: Two times a day (BID) | ORAL | 1 refills | Status: DC

## 2016-02-29 NOTE — Assessment & Plan Note (Signed)
Healing. Continue suppressive therapy with valtrex.

## 2016-02-29 NOTE — Assessment & Plan Note (Addendum)
Symptoms are improved with increased amitriptyline. No medication changes today. Continue PRN capsaicin and lidocaine creams for scalp pain. Continue gabapentin at TID dosing. Consider change to lyrica if not effective.  Discussed need to continue suppressive therapy- especially with upcoming treatment for breast cancer. Recheck 4 weeks.

## 2016-02-29 NOTE — Progress Notes (Signed)
Subjective:    Patient ID: Joanna Phillips, female    DOB: 1950/07/23, 65 y.o.   MRN: [id number]  HPI: Joanna Phillips is a 65 y.o. female presenting on 02/29/2016 for Postherpetic neuralgia   HPI  Pt presents for follow-up of post-herpetic neuralgia. Recent diagnosis of breast cancer. Lumpectomy for breast cancer is scheduled on 10/24. 2 week recovery post lumpectomy and 4 weeks (at least of radiation). Scalp lesions are still present but improving. ID started Lac hydrin for scalp lesions. Unclear if it is helping. Makes her head hurt. Increased dose in Elavil to 50mg  has helped with pain. Taking 600 TID- will sometimes take an extra dose if it hurting. This helps her sleep at night. Has recent MRI for tongue swelling- thought to be from denervation changes. Thought to resolve over time. Using capsaicin cream for burning through neck and chest with good effect. Continues on suppressive therapy twice daily for recurrent shingles.  Needs omeprazole at [facility]/2 issue with [employer] supply.   Past Medical History:  Diagnosis Date  . Arthritis   . CLL (chronic lymphocytic leukemia) ([place]) 2011  . GERD (gastroesophageal reflux disease)     Current Outpatient Prescriptions on File Prior to Visit  Medication Sig  . amitriptyline (ELAVIL) 50 MG tablet Take 1 tablet (50 mg total) by mouth at bedtime.  . Calcium Carbonate-Vitamin D (CALCIUM + D PO) Take 1 tablet by mouth daily.  . capsaicin (ZOSTRIX) 0.025 % cream Apply topically 2 (two) times daily.  . ciprofloxacin (CIPRO) 250 MG tablet Take 1 tablet (250 mg total) by mouth 2 (two) times daily.  . diphenhydrAMINE (BENADRYL) 25 MG tablet Take 1 tablet (25 mg total) by mouth every 6 (six) hours as needed.  . fluticasone (FLONASE) 50 MCG/ACT nasal spray Place into both nostrils as needed.   . gabapentin (NEURONTIN) 300 MG capsule Take 2 capsules (600 mg total) by mouth 3 (three) times daily.  . hydrOXYzine (ATARAX/VISTARIL) 25 MG tablet Take 1 twice a day  and 1 or 2 at night as needed.  Marland Kitchen ibuprofen (ADVIL,MOTRIN) 800 MG tablet Take 1 tablet by mouth two  times daily as needed  . loratadine (CLARITIN) 10 MG tablet Take 1 tablet (10 mg total) by mouth daily.  . meclizine (ANTIVERT) 25 MG tablet TAKE 1 TABLET (25 MG TOTAL) BY MOUTH 3 (THREE) TIMES DAILY AS NEEDED FOR DIZZINESS.  Marland Kitchen oxyCODONE-acetaminophen (PERCOCET/ROXICET) 5-325 MG tablet Take 1 tablet by mouth every 8 (eight) hours as needed for severe pain.   No current facility-administered medications on file prior to visit.     Review of Systems  Constitutional: Negative for chills and fever.  HENT: Negative.  Negative for mouth sores, sinus pressure, sore throat and trouble swallowing.        Tongue swelling mildly improved.   Respiratory: Negative for cough, chest tightness and wheezing.   Cardiovascular: Negative for chest pain and leg swelling.  Gastrointestinal: Negative for abdominal pain, constipation, diarrhea, nausea and vomiting.  Endocrine: Negative.  Negative for cold intolerance, heat intolerance, polydipsia, polyphagia and polyuria.  Genitourinary: Negative for difficulty urinating and dysuria.  Musculoskeletal: Negative.   Skin: Positive for rash and wound.  Neurological: Negative for dizziness, light-headedness and numbness.       Tingly and electric shock pain throughout R scalp, neck, and chest.  Psychiatric/Behavioral: Negative.    Per HPI unless specifically indicated above     Objective:    BP 111/77   Pulse 89  Temp 97.8 F (36.6 C) (Oral)   Resp 16   Ht 5\' 5"  (1.651 m)   Wt 200 lb (90.7 kg)   BMI 33.28 kg/m   Wt Readings from Last 3 Encounters:  02/29/16 200 lb (90.7 kg)  02/13/16 199 lb (90.3 kg)  01/18/16 200 lb 9.6 oz (91 kg)    Physical Exam  Constitutional: She is oriented to person, place, and time. She appears well-developed and well-nourished.  HENT:  Head: Normocephalic and atraumatic.  Right Ear: Hearing and tympanic membrane normal.    Left Ear: Hearing normal.  Nose: Nose normal.  Mouth/Throat: Mucous membranes are normal.  Mild swelling R lateral tongue.      Neck: Neck supple.  Cardiovascular: Normal rate, regular rhythm and normal heart sounds.  Exam reveals no gallop and no friction rub.   No murmur heard. Pulmonary/Chest: Effort normal and breath sounds normal. She has no wheezes. She exhibits no tenderness.  Abdominal: Soft. Normal appearance and bowel sounds are normal. She exhibits no distension and no mass. There is no tenderness. There is no rebound and no guarding.  Musculoskeletal: Normal range of motion. She exhibits no edema or tenderness.  Lymphadenopathy:    She has no cervical adenopathy.  Neurological: She is alert and oriented to person, place, and time.  Skin: Skin is warm and dry. Rash noted. No pallor.  Healing crusted zoster lesion along the scalp. Still eschar and crusting at the top of the R scalp but new skin and evidence of healing lesions seen along the occiput. See image.   Psychiatric: She has a normal mood and affect. Her behavior is normal. Judgment and thought content normal.         Results for orders placed or performed in visit on [id number]  Basic Metabolic Panel (BMET)  Result Value Ref Range   Glucose 93 65 - 99 mg/dL   BUN 18 8 - 27 mg/dL   Creatinine, Ser 0.87 0.57 - 1.00 mg/dL   GFR calc non Af Amer 70 >59 mL/min/1.73   GFR calc Af Amer 81 >59 mL/min/1.73   BUN/Creatinine Ratio 21 12 - 28   Sodium 141 134 - 144 mmol/L   Potassium 4.6 3.5 - 5.2 mmol/L   Chloride 103 96 - 106 mmol/L   CO2 24 18 - 29 mmol/L   Calcium 9.2 8.7 - 10.3 mg/dL      Assessment & Plan:   Problem List Items Addressed This Visit      Digestive   GERD (gastroesophageal reflux disease)    [facility] request.       Relevant Medications   omeprazole (PRILOSEC) 20 MG capsule     Nervous and Auditory   Post herpetic neuralgia - Primary    Symptoms  are improved with increased amitriptyline. No medication changes today. Continue PRN capsaicin and lidocaine creams for scalp pain. Continue gabapentin at TID dosing. Consider change to lyrica if not effective.  Discussed need to continue suppressive therapy- especially with upcoming treatment for breast cancer. Recheck 4 weeks.         Other   Disseminated zoster    Healing. Continue suppressive therapy with valtrex.       Relevant Medications   valACYclovir (VALTREX) 500 MG tablet   DCIS (ductal carcinoma in situ) of breast    Will be treated at [facility]. Planned lumpectomy on 10/24 with follow-up radiation. Discussed need for continued shingles suppressive therapy with upcoming cancer  treatment.       Relevant Medications   valACYclovir (VALTREX) 500 MG tablet    Other Visit Diagnoses   None.     Meds ordered this encounter  Medications  . ammonium lactate (LAC-HYDRIN) 12 % lotion    Sig: Apply topically.  . pyridOXINE (VITAMIN B-6) 25 MG tablet    Sig: Take by mouth.  Marland Kitchen omeprazole (PRILOSEC) 20 MG capsule    Sig: Take 1 capsule by mouth two times daily    Dispense:  180 capsule    Refill:  3    Order Specific Question:   Supervising Provider    Answer:   Arlis Porta [phone]  . valACYclovir (VALTREX) 500 MG tablet    Sig: Take 1 tablet (500 mg total) by mouth 2 (two) times daily.    Dispense:  60 tablet    Refill:  5    Order Specific Question:   Supervising Provider    Answer:   Arlis Porta [id number]      Follow up plan: Return in about 4 weeks (around 03/28/2016), or if symptoms worsen or fail to improve, for PHN.

## 2016-02-29 NOTE — Addendum Note (Signed)
Addended byVincenza Hews, Faustino Luecke L on: 02/29/2016 05:32 PM   Modules accepted: Orders

## 2016-02-29 NOTE — Assessment & Plan Note (Signed)
Will be treated at [facility]. Planned lumpectomy on 10/24 with follow-up radiation. Discussed need for continued shingles suppressive therapy with upcoming cancer treatment.

## 2016-02-29 NOTE — Patient Instructions (Addendum)
No changes to medication regimen today. Continue to take amitriptyline 50mg  at bedtime to help with pain.  Your scalp is looking better. Continue Capsaicin cream PRN for pain on the chest and scalp.  Continue lac-hydrin as needed.

## 2016-02-29 NOTE — Assessment & Plan Note (Signed)
[facility] request.

## 2016-03-05 ENCOUNTER — Other Ambulatory Visit: Payer: Self-pay | Admitting: Family Medicine

## 2016-03-11 DIAGNOSIS — D0510 Intraductal carcinoma in situ of unspecified breast: Secondary | ICD-10-CM

## 2016-03-11 HISTORY — PX: BREAST LUMPECTOMY: SHX2

## 2016-03-11 HISTORY — DX: Intraductal carcinoma in situ of unspecified breast: D05.10

## 2016-03-12 ENCOUNTER — Ambulatory Visit: Payer: Self-pay | Admitting: General Surgery

## 2016-03-17 ENCOUNTER — Ambulatory Visit (INDEPENDENT_AMBULATORY_CARE_PROVIDER_SITE_OTHER): Payer: 59 | Admitting: Family Medicine

## 2016-03-17 ENCOUNTER — Encounter: Payer: Self-pay | Admitting: Family Medicine

## 2016-03-17 VITALS — BP 119/78 | HR 109 | Temp 97.5°F | Resp 16 | Ht 65.0 in | Wt 200.0 lb

## 2016-03-17 DIAGNOSIS — N3001 Acute cystitis with hematuria: Secondary | ICD-10-CM

## 2016-03-17 LAB — POCT URINALYSIS DIPSTICK
BILIRUBIN UA: NEGATIVE
GLUCOSE UA: NEGATIVE
Ketones, UA: NEGATIVE
NITRITE UA: NEGATIVE
Protein, UA: 100
Spec Grav, UA: <=1.005
UROBILINOGEN UA: 0.2
pH, UA: 6.5

## 2016-03-17 MED ORDER — CIPROFLOXACIN HCL 500 MG PO TABS: 500.0000 mg | ORAL_TABLET | Freq: Two times a day (BID) | ORAL | 0 refills | Status: DC

## 2016-03-17 NOTE — Progress Notes (Signed)
Name: Joanna Phillips   MRN: [id number]    DOB: 30-Sep-1950   Date:03/17/2016       Progress Note  Subjective  Chief Complaint  Chief Complaint  Patient presents with  . Urinary Tract Infection    HPI Here c/o urinary dysuria, freq, urgency.  Present x 2 days.  No blood seen.  No fever.  No N/V.  No back pain. No problem-specific Assessment & Plan notes found for this encounter.   Past Medical History:  Diagnosis Date  . Arthritis   . CLL (chronic lymphocytic leukemia) ([place]) 2011  . GERD (gastroesophageal reflux disease)     Past Surgical History:  Procedure Laterality Date  . BREAST BIOPSY Right 02/12/2016   path pending  . CHOLECYSTECTOMY  1996  . COLONOSCOPY  2013    Family History  Problem Relation Age of Onset  . Cancer Father     lung  . Diabetes Mother   . Breast cancer Maternal Aunt     Social History   Social History  . Marital status: Married    Spouse name: N/A  . Number of children: N/A  . Years of education: N/A   Occupational History  . Not on file.   Social History Main Topics  . Smoking status: Never Smoker  . Smokeless tobacco: Never Used  . Alcohol use No  . Drug use: No  . Sexual activity: Not on file   Other Topics Concern  . Not on file   Social History Narrative  . No narrative on file     Current Outpatient Prescriptions:  .  amitriptyline (ELAVIL) 50 MG tablet, Take 1 tablet (50 mg total) by mouth at bedtime., Disp: 30 tablet, Rfl: 11 .  fluticasone (FLONASE) 50 MCG/ACT nasal spray, Place into both nostrils as needed. , Disp: , Rfl:  .  gabapentin (NEURONTIN) 300 MG capsule, Take 2 capsules (600 mg total) by mouth 3 (three) times daily., Disp: 180 capsule, Rfl: 5 .  loratadine (CLARITIN) 10 MG tablet, Take 1 tablet (10 mg total) by mouth daily., Disp: 30 tablet, Rfl: 11 .  omeprazole (PRILOSEC) 20 MG capsule, Take 1 capsule by mouth two times daily, Disp: 180 capsule, Rfl: 3 .  valACYclovir (VALTREX) 500 MG tablet, Take 1  tablet (500 mg total) by mouth 2 (two) times daily., Disp: 180 tablet, Rfl: 1  Allergies  Allergen Reactions  . Amoxicillin Hives  . Hydrocodone Itching  . Sulfamethoxazole-Trimethoprim Nausea Only  . Allopurinol Rash     Review of Systems  Constitutional: Negative for chills, fever, malaise/fatigue and weight loss.  HENT: Negative for hearing loss.   Eyes: Negative for blurred vision and double vision.  Respiratory: Negative for cough, shortness of breath and wheezing.   Cardiovascular: Negative for chest pain, palpitations and leg swelling.  Gastrointestinal: Negative for abdominal pain, blood in stool and heartburn.  Genitourinary: Positive for dysuria, frequency and urgency. Negative for hematuria.  Skin: Negative for rash.  Neurological: Positive for sensory change (post herpetic neurtalgia.). Negative for dizziness, tingling, tremors, weakness and headaches.      Objective  Vitals:   03/17/16 1051  BP: 119/78  Pulse: (!) 109  Resp: 16  Temp: 97.5 F (36.4 C)  TempSrc: Oral  Weight: 200 lb (90.7 kg)  Height: 5\' 5"  (1.651 m)    Physical Exam  Constitutional: She is oriented to person, place, and time and well-developed, well-nourished, and in no distress. No distress.  HENT:  Head: Normocephalic and atraumatic.  Cardiovascular: Normal rate, regular rhythm and normal heart sounds.  Exam reveals no gallop and no friction rub.   No murmur heard. Pulmonary/Chest: Effort normal and breath sounds normal. No respiratory distress. She has no wheezes. She has no rales.  Abdominal: Soft. Bowel sounds are normal. There is no tenderness.  No CVA tenderness  Musculoskeletal: She exhibits no edema.  Neurological: She is alert and oriented to person, place, and time.  Vitals reviewed.      Recent Results (from the past 2160 hour(s))  POCT Urinalysis Dipstick     Status: Abnormal   Collection Time: 01/08/16  2:07 PM  Result Value Ref Range   Color, UA amber     Clarity, UA clear    Glucose, UA negative    Bilirubin, UA negative    Ketones, UA negative    Spec Grav, UA 1.020    Blood, UA large    pH, UA 5.0    Protein, UA trace    Urobilinogen, UA negative    Nitrite, UA negative    Leukocytes, UA Trace (A) Negative  CULTURE, URINE COMPREHENSIVE     Status: None   Collection Time: 01/08/16  2:31 PM  Result Value Ref Range   Culture KLEBSIELLA PNEUMONIAE    Colony Count >=100,000 COLONIES/ML    Organism ID, Bacteria KLEBSIELLA PNEUMONIAE       Susceptibility   Klebsiella pneumoniae -  (no method available)    AMPICILLIN >=32 Resistant     AMOX/CLAVULANIC <=2 Sensitive     AMPICILLIN/SULBACTAM 8 Sensitive     PIP/TAZO <=4 Sensitive     IMIPENEM <=0.25 Sensitive     CEFAZOLIN <=4 Not Reportable     CEFTRIAXONE <=1 Sensitive     CEFTAZIDIME <=1 Sensitive     CEFEPIME <=1 Sensitive     GENTAMICIN <=1 Sensitive     TOBRAMYCIN <=1 Sensitive     CIPROFLOXACIN <=0.25 Sensitive     LEVOFLOXACIN <=0.12 Sensitive     NITROFURANTOIN 64 Intermediate     TRIMETH/SULFA* <=20 Sensitive      * NR=NOT REPORTABLE,SEE COMMENTORAL therapy:A cefazolin MIC of <32 predicts susceptibility to the oral agents cefaclor,cefdinir,cefpodoxime,cefprozil,cefuroxime,cephalexin,and loracarbef when used for therapy of uncomplicated UTIs due to E.coli,K.pneumomiae,and P.mirabilis. PARENTERAL therapy: A cefazolinMIC of >8 indicates resistance to parenteralcefazolin. An alternate test method must beperformed to confirm susceptibility to parenteralcefazolin.  POCT Urinalysis Dipstick     Status: Abnormal   Collection Time: 01/18/16  9:28 AM  Result Value Ref Range   Color, UA yellow    Clarity, UA clear    Glucose, UA negative    Bilirubin, UA negative    Ketones, UA negative    Spec Grav, UA 1.010    Blood, UA large    pH, UA 5.0    Protein, UA negative    Urobilinogen, UA negative    Nitrite, UA negative    Leukocytes, UA large (3+) (A) Negative  CBC with  Differential     Status: Abnormal   Collection Time: 01/18/16 10:33 AM  Result Value Ref Range   WBC 14.8 (H) 3.4 - 10.8 x10E3/uL   RBC 4.07 3.77 - 5.28 x10E6/uL   Hemoglobin 11.0 (L) 11.1 - 15.9 g/dL   Hematocrit 32.8 (L) 34.0 - 46.6 %   MCV 81 79 - 97 fL   MCH 27.0 26.6 - 33.0 pg   MCHC 33.5 31.5 - 35.7 g/dL   RDW 17.4 (H) 12.3 - 15.4 %   Platelets 173 150 -  379 x10E3/uL   Neutrophils 26 %   Lymphs 65 %    Comment: Smudge cells present Atypical lymphocytes.    Monocytes 7 %   Eos 2 %   Basos 0 %   Neutrophils Absolute 3.8 1.4 - 7.0 x10E3/uL   Lymphocytes Absolute 9.6 (H) 0.7 - 3.1 x10E3/uL   Monocytes Absolute 1.1 (H) 0.1 - 0.9 x10E3/uL   EOS (ABSOLUTE) 0.2 0.0 - 0.4 x10E3/uL   Basophils Absolute 0.0 0.0 - 0.2 x10E3/uL   Immature Granulocytes 0 %   Immature Grans (Abs) 0.0 0.0 - 0.1 x10E3/uL   Hematology Comments: Note:     Comment: Verified by microscopic examination.  Comprehensive Metabolic Panel (CMET)     Status: Abnormal   Collection Time: 01/18/16 10:33 AM  Result Value Ref Range   Glucose 96 65 - 99 mg/dL   BUN 53 (H) 8 - 27 mg/dL   Creatinine, Ser 1.95 (H) 0.57 - 1.00 mg/dL   GFR calc non Af Amer 26 (L) >59 mL/min/1.73   GFR calc Af Amer 30 (L) >59 mL/min/1.73   BUN/Creatinine Ratio 27 12 - 28   Sodium 139 134 - 144 mmol/L   Potassium 4.8 3.5 - 5.2 mmol/L   Chloride 104 96 - 106 mmol/L   CO2 19 18 - 29 mmol/L   Calcium 8.9 8.7 - 10.3 mg/dL   Total Protein 7.4 6.0 - 8.5 g/dL   Albumin 4.2 3.6 - 4.8 g/dL   Globulin, Total 3.2 1.5 - 4.5 g/dL   Albumin/Globulin Ratio 1.3 1.2 - 2.2   Bilirubin Total <0.2 0.0 - 1.2 mg/dL   Alkaline Phosphatase 72 39 - 117 IU/L   AST 21 0 - 40 IU/L   ALT 20 0 - 32 IU/L  Comprehensive metabolic panel     Status: Abnormal   Collection Time: 01/23/16 10:34 AM  Result Value Ref Range   Glucose 118 (H) 65 - 99 mg/dL   BUN 27 8 - 27 mg/dL   Creatinine, Ser 1.14 (H) 0.57 - 1.00 mg/dL   GFR calc non Af Amer 51 (L) >59 mL/min/1.73    GFR calc Af Amer 58 (L) >59 mL/min/1.73   BUN/Creatinine Ratio 24 12 - 28   Sodium 139 134 - 144 mmol/L   Potassium 4.9 3.5 - 5.2 mmol/L   Chloride 104 96 - 106 mmol/L   CO2 24 18 - 29 mmol/L   Calcium 9.2 8.7 - 10.3 mg/dL   Total Protein 7.6 6.0 - 8.5 g/dL   Albumin 4.2 3.6 - 4.8 g/dL   Globulin, Total 3.4 1.5 - 4.5 g/dL   Albumin/Globulin Ratio 1.2 1.2 - 2.2   Bilirubin Total 0.3 0.0 - 1.2 mg/dL   Alkaline Phosphatase 64 39 - 117 IU/L   AST 20 0 - 40 IU/L   ALT 17 0 - 32 IU/L  CBC     Status: Abnormal   Collection Time: 01/23/16 10:34 AM  Result Value Ref Range   WBC 17.0 (H) 3.4 - 10.8 x10E3/uL   RBC 4.02 3.77 - 5.28 x10E6/uL   Hemoglobin 10.9 (L) 11.1 - 15.9 g/dL   Hematocrit 32.3 (L) 34.0 - 46.6 %   MCV 80 79 - 97 fL   MCH 27.1 26.6 - 33.0 pg   MCHC 33.7 31.5 - 35.7 g/dL   RDW 17.7 (H) 12.3 - 15.4 %   Platelets 192 150 - 379 [id number]  Basic Metabolic Panel (BMET)     Status: Abnormal   Collection Time: 01/30/16  8:22 AM  Result Value Ref Range   Glucose 114 (H) 65 - 99 mg/dL   BUN 21 8 - 27 mg/dL   Creatinine, Ser 0.97 0.57 - 1.00 mg/dL   GFR calc non Af Amer 61 >59 mL/min/1.73   GFR calc Af Amer 71 >59 mL/min/1.73   BUN/Creatinine Ratio 22 12 - 28   Sodium 139 134 - 144 mmol/L   Potassium 4.3 3.5 - 5.2 mmol/L   Chloride 101 96 - 106 mmol/L   CO2 27 18 - 29 mmol/L   Calcium 9.0 8.7 - 10.3 mg/dL  Surgical pathology     Status: None   Collection Time: 02/12/16  1:25 PM  Result Value Ref Range   SURGICAL PATHOLOGY      Surgical Pathology CASE: [id number] PATIENT: [facility] Surgical Pathology Report     SPECIMEN SUBMITTED: A. Breast, right, UIQ  CLINICAL HISTORY: Calcs on mammo  PRE-OPERATIVE DIAGNOSIS: Should be DCIS  POST-OPERATIVE DIAGNOSIS: None provided.     DIAGNOSIS: A. RIGHT BREAST, UPPER INNER QUADRANT; BIOPSY: - DUCTAL CARCINOMA IN SITU, HIGH-GRADE, COMEDO TYPE WITH MICROCALCIFICATIONS.  Note: DCIS is present in 4 of 5  tissue blocks. Results were called to [facility], Radiology Department on 02/13/16 at 2:43 PM.   GROSS DESCRIPTION:  A. The specimen is received in a formalin-filled Coretainer device labeled with the patient's name and right breast UIQ.  Core pieces: multiple Measurement: aggregate, 3.5 x 2.5 x 0.2 cm Comments: yellow lobulated fibrofatty, marked blue Accompanying post- surgical radiograph: yes, calcifications in sections 12, 3 and 6  Entirely submitted in cassette(s):  1-section 12 2-section 3 3-section 6 4-section 9 5-centra l unmarked section  Time/Date in fixative: collected at 1:16 PM placed in formalin at 1:19 PM on 02/12/2016 Total fixation time: 7 hours   Final Diagnosis performed by Delorse Lek, MD.  Electronically signed 02/13/2016 2:47:46PM    The electronic signature indicates that the named Attending Pathologist has evaluated the specimen  Technical component performed at [facility], [address] [place], [place] [address] Lab: [phone] Dir: Darrick Penna. Evette Doffing, MD  Professional component performed at [facility], [facility], [place], [place], [place] [address] Lab: [phone] Dir: Dellia Nims. Reuel Derby, MD    Basic Metabolic Panel (BMET)     Status: None   Collection Time: 02/13/16 10:49 AM  Result Value Ref Range   Glucose 93 65 - 99 mg/dL   BUN 18 8 - 27 mg/dL   Creatinine, Ser 0.87 0.57 - 1.00 mg/dL   GFR calc non Af Amer 70 >59 mL/min/1.73   GFR calc Af Amer 81 >59 mL/min/1.73   BUN/Creatinine Ratio 21 12 - 28   Sodium 141 134 - 144 mmol/L   Potassium 4.6 3.5 - 5.2 mmol/L   Chloride 103 96 - 106 mmol/L   CO2 24 18 - 29 mmol/L   Calcium 9.2 8.7 - 10.3 mg/dL  POCT Urinalysis Dipstick     Status: Abnormal   Collection Time: 03/17/16 11:03 AM  Result Value Ref Range   Color, UA straw    Clarity, UA cloudy    Glucose, UA neg    Bilirubin, UA neg    Ketones, UA neg    Spec Grav, UA <=1.005    Blood, UA large    pH, UA 6.5     Protein, UA 100    Urobilinogen, UA 0.2    Nitrite, UA neg    Leukocytes, UA large (3+) (A) Negative     Assessment &  Plan  Problem List Items Addressed This Visit    None    Visit Diagnoses    Acute cystitis with hematuria    -  Primary   Relevant Orders   POCT Urinalysis Dipstick (Completed)      Meds ordered this encounter  Medications  . DISCONTD: Calcium Carbonate-Vitamin D (CALCIUM-VITAMIN D) 500-200 MG-UNIT tablet    Sig: Take 1 tablet by mouth 2 (two) times daily.  Marland Kitchen DISCONTD: oxyCODONE (OXY IR/ROXICODONE) 5 MG immediate release tablet    Sig: Take 4 mg by mouth daily as needed.

## 2016-03-19 LAB — URINE CULTURE

## 2016-03-28 ENCOUNTER — Telehealth: Payer: Self-pay | Admitting: *Deleted

## 2016-03-28 NOTE — Telephone Encounter (Signed)
She got a call from pt's husband and they want appt for radiation for breast cancer.  Joanna Phillips wants a ref from [facility] for this.  I told her I will need to call pt and speak to them. Dr. Mike Phillips rcvd a note from Dr Joanna [facility] about pt's cancer and the need for radiation and start AI. It does say in note that pt may want to go to [facility] because it is closer to home.  I called pt's home and spoke to husband and he states that his wife has been out of work for 3 months and she started back working this week. They want to have radiaiton and f/u onc here and not at [facility]  I spoke to [facility] and then check [facility] schedule and pt can come 11/16 10 am to see [facility] and 11 am to see [facility]. I called pt's husband back and he is agreeable to the appts. I did tell him that if they are going to f/u here then he should cancel the appt at [facility].  He said he spoke to them this morning and told them he may cancel if he can get appt with [place] and now he has one so he will let them know.

## 2016-04-03 ENCOUNTER — Inpatient Hospital Stay: Payer: 59 | Attending: Hematology and Oncology | Admitting: Hematology and Oncology

## 2016-04-03 ENCOUNTER — Encounter: Payer: Self-pay | Admitting: Radiation Oncology

## 2016-04-03 ENCOUNTER — Ambulatory Visit: Payer: 59 | Admitting: Family Medicine

## 2016-04-03 ENCOUNTER — Encounter: Payer: Self-pay | Admitting: Hematology and Oncology

## 2016-04-03 ENCOUNTER — Ambulatory Visit
Admission: RE | Admit: 2016-04-03 | Discharge: 2016-04-03 | Disposition: A | Discharge status: Home / Self Care | Payer: 59 | Source: Ambulatory Visit | Attending: Radiation Oncology | Admitting: Radiation Oncology

## 2016-04-03 VITALS — BP 127/83 | HR 83 | Temp 97.6°F | Resp 20 | Wt 206.7 lb

## 2016-04-03 VITALS — BP 105/71 | HR 82 | Temp 95.5°F | Resp 18 | Wt 206.6 lb

## 2016-04-03 DIAGNOSIS — Z809 Family history of malignant neoplasm, unspecified: Secondary | ICD-10-CM | POA: Diagnosis not present

## 2016-04-03 DIAGNOSIS — C9110 Chronic lymphocytic leukemia of B-cell type not having achieved remission: Secondary | ICD-10-CM | POA: Diagnosis not present

## 2016-04-03 DIAGNOSIS — Z9049 Acquired absence of other specified parts of digestive tract: Secondary | ICD-10-CM | POA: Insufficient documentation

## 2016-04-03 DIAGNOSIS — M792 Neuralgia and neuritis, unspecified: Secondary | ICD-10-CM | POA: Diagnosis not present

## 2016-04-03 DIAGNOSIS — K219 Gastro-esophageal reflux disease without esophagitis: Secondary | ICD-10-CM

## 2016-04-03 DIAGNOSIS — Z803 Family history of malignant neoplasm of breast: Secondary | ICD-10-CM | POA: Insufficient documentation

## 2016-04-03 DIAGNOSIS — Z9011 Acquired absence of right breast and nipple: Secondary | ICD-10-CM

## 2016-04-03 DIAGNOSIS — D259 Leiomyoma of uterus, unspecified: Secondary | ICD-10-CM

## 2016-04-03 DIAGNOSIS — Z79899 Other long term (current) drug therapy: Secondary | ICD-10-CM | POA: Insufficient documentation

## 2016-04-03 DIAGNOSIS — M129 Arthropathy, unspecified: Secondary | ICD-10-CM | POA: Insufficient documentation

## 2016-04-03 DIAGNOSIS — Z170 Estrogen receptor positive status [ER+]: Secondary | ICD-10-CM | POA: Insufficient documentation

## 2016-04-03 DIAGNOSIS — D0511 Intraductal carcinoma in situ of right breast: Secondary | ICD-10-CM | POA: Insufficient documentation

## 2016-04-03 DIAGNOSIS — Z801 Family history of malignant neoplasm of trachea, bronchus and lung: Secondary | ICD-10-CM | POA: Diagnosis not present

## 2016-04-03 DIAGNOSIS — Z510 Encounter for antineoplastic radiation therapy: Secondary | ICD-10-CM | POA: Insufficient documentation

## 2016-04-03 NOTE — Progress Notes (Signed)
[facility] day:  04/03/16  Chief Complaint: Joanna Phillips is a 65 y.o. female with chronic lymphocytic leukemia (CLL) who is seen for reassessment after recent lumpectomy for DCIS.  HPI:  The patient was last seen in the medical oncology clinic on 01/17/2016.  At that time, she was seen for reassessment after interval hospitalization for disseminated zoster.  We discussed long term prophylactic valacyclovir.  Screening mammogram on 12/20/2015 revealed suspicious calcifications in the right breast.  Diagnostic right mammogram on 02/04/2016 revealed segmental linear calcifications located within the upper inner quadrant of the right breast highly suspicious for DCIS.   Biopsy on 02/12/2016 revealed DCIS, high grade, comedo type with microcalcifications.  She underwent wire-guided right partial mastectomy on 03/11/2016 at [facility] by Dr Juan Quam.  Pathology revealed a 3.9 cm grade III DCIS.  ER was strongly positive (Allred score 8) in 100% of cells.  PR was strongly positive (Allred score 6) in 30% of cells.  Pathologic stage was TisNx.  She saw Dr. Baruch Gouty today for planned radiation.  She states that she will undergo simulation after Thanksgiving.     Past Medical History:  Diagnosis Date  . Arthritis   . CLL (chronic lymphocytic leukemia) ([place]) 2011  . DCIS (ductal carcinoma in situ) 03/11/2016  . GERD (gastroesophageal reflux disease)     Past Surgical History:  Procedure Laterality Date  . BREAST BIOPSY Right 02/12/2016   path pending  . CHOLECYSTECTOMY  1996  . COLONOSCOPY  2013    Family History  Problem Relation Age of Onset  . Cancer Father     lung  . Diabetes Mother   . Breast cancer Maternal Aunt     Social History:  reports that she has never smoked. She has never used smokeless tobacco. She reports that she does not drink alcohol or use drugs.  The patient is accompanied by her husband, [name],  today.  Allergies:  Allergies  Allergen Reactions  . Amoxicillin Hives  . Hydrocodone Itching  . Sulfamethoxazole-Trimethoprim Nausea Only  . Allopurinol Rash    Current Medications: Current Outpatient Prescriptions  Medication Sig Dispense Refill  . amitriptyline (ELAVIL) 50 MG tablet Take 1 tablet (50 mg total) by mouth at bedtime. 30 tablet 11  . fluticasone (FLONASE) 50 MCG/ACT nasal spray Place into both nostrils as needed.     . gabapentin (NEURONTIN) 300 MG capsule Take 2 capsules (600 mg total) by mouth 3 (three) times daily. 180 capsule 5  . loratadine (CLARITIN) 10 MG tablet Take 1 tablet (10 mg total) by mouth daily. 30 tablet 11  . omeprazole (PRILOSEC) 20 MG capsule Take 1 capsule by mouth two times daily 180 capsule 3  . valACYclovir (VALTREX) 500 MG tablet Take 1 tablet (500 mg total) by mouth 2 (two) times daily. 180 tablet 1  . ciprofloxacin (CIPRO) 500 MG tablet Take 1 tablet (500 mg total) by mouth 2 (two) times daily. (Patient not taking: Reported on 04/03/2016) 14 tablet 0   No current facility-administered medications for this visit.     Review of Systems:  GENERAL:  Feels "ok".  No fevers or sweats.  Weight gain of 7 pounds. PERFORMANCE STATUS (ECOG):  1 HEENT:  Decreased right sided hearing.  Right side of tongue swelling.  No visual changes, runny nose, sore throat, mouth sores or tenderness. Lungs: No shortness of breath or cough.  No hemoptysis. Cardiac:  No chest pain, palpitations, orthopnea, or PND. GI:  No nausea, vomiting, diarrhea, constipation, melena or hematochezia. GU:  No urgency, frequency, dysuria, or hematuria.  Musculoskeletal: No back pain.  No joint pain.  No muscle tenderness. Extremities:  No pain or swelling. Skin:  No rashes or skin changes. Neuro:  No headache, numbness or weakness, balance or coordination issues. Endocrine:  No diabetes, thyroid issues, hot flashes or night sweats. Psych:  No mood changes, depression or  anxiety. Pain:  No focal pain. Review of systems:  All other systems reviewed and found to be negative.  Physical Exam: Blood pressure 105/71, pulse 82, temperature (!) 95.5 F (35.3 C), temperature source Tympanic, resp. rate 18, weight 206 lb 9.1 oz (93.7 kg). GENERAL:  Well developed, well nourished, woman sitting comfortably in the exam room in no acute distress. MENTAL STATUS:  Alert and oriented to person, place and time. HEAD:  Wearing a pink cap.  Short graying hair.  Face symmetric.  No Cushingoid features. EYES:  Blue eyes.  Pupils equal round and reactive to light and accomodation.  No conjunctivitis or scleral icterus. ENT:  Speech slightly affected.  Tongue deviates to the right secondary to atrophy.  Oropharynx clear without lesions. Mucous membranes moist.   RESPIRATORY:  Clear to auscultation without rales, wheezes or rhonchi. CARDIOVASCULAR:  Regular rate and rhythm without murmur, rub or gallop. BREAST:  Right breast with well healed incision at 2 o'clock position.  [place], skin changes or nipple discharge.  Left breast without masses, skin changes or nipple discharge.  ABDOMEN:  Soft, non-tender, with active bowel sounds, and no hepatosplenomegaly.  No masses. SKIN: Posterior scalp thick crusting.  Isolated scattered truncal lesions (previously vesicles) pointed out by patient, well healed. EXTREMITIES: No edema, no skin discoloration or tenderness.  No palpable cords. LYMPH NODES:  No palpable cervical, supraclavicular, axillary or inguinal adenopathy  NEUROLOGICAL: Unremarkable. PSYCH:  Appropriate.   No visits with results within 3 Day(s) from this visit.  Latest known visit with results is:  Office Visit on 03/17/2016  Component Date Value Ref Range Status  . Color, UA 03/17/2016 straw   Final  . Clarity, UA 03/17/2016 cloudy   Final  . Glucose, UA 03/17/2016 neg   Final  . Bilirubin, UA 03/17/2016 neg   Final  . Ketones, UA 03/17/2016 neg   Final  . Spec  Grav, UA 03/17/2016 <=1.005   Final  . Blood, UA 03/17/2016 large   Final  . pH, UA 03/17/2016 6.5   Final  . Protein, UA 03/17/2016 100   Final  . Urobilinogen, UA 03/17/2016 0.2   Final  . Nitrite, UA 03/17/2016 neg   Final  . Leukocytes, UA 03/17/2016 large (3+)* Negative Final  . Organism ID, Bacteria 03/19/2016    Final                   Value:Multiple organisms present,each less than 10,000 CFU/mL. These organisms,commonly found on external and internal genitalia,are considered colonizers. No further testing performed.    [facility] Labs:  Labs on 06/08/2015 revealed a hematocrit of 35.1, hemoglobin 11.7, MCV 81, platelets 225,000, WBC 17,200 with an ANC of 3100.  Absolutle lymphocyte count (ALC) was 12,200.  Uric acid was 8.0 (elevated; last check 7.7- elevated).  Creatinine was 0.90.  LDH was 217.  [facility] labs on 12/05/2015 revealed a hematocrit of 34.1 hemoglobin 10.9, MCV 81, platelets 213,000, WBC 17,700 with an ANC of 3100.  Absolute lymphocyte count (ALC) was 13,600.  Uric acid was 6.0.  Creatinine was 0.81.  LDH was 212.   Assessment:  JOEN HEVNER is a 65 y.o. female with stage I chronic lymphocytic leukemia diagnosed in 2011.  She was diagnosed with right breast DCIS in 02/2016.  CLL:  She presented with mild lymphocytosis and small cervical adenopathy on routine physical exam.  CBC revealed only lymphocytosis.  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  SPEP was normal.  Chest, abdomen, and pelvic CT scan revealed small diffuse nodes.  DCIS:  She underwent wire-guided right partial mastectomy on 03/11/2016 at [facility].  Pathology revealed a 3.9 cm grade III DCIS.  ER was strongly positive (Allred score 8) in 100% of cells.  PR was strongly positive (Allred score 6) in 30% of cells.  Pathologic stage was TisNx.  She has a history of  intermittent rectal and vaginal bleeding in 2015.  She was seen by gynecology at the [facility].  Pelvic exam and ultrasound revealed fibroids.   She has recurrent vaginal bleeding.  EGD on 11/11/2011 revealed a hiatal hernia and gastritis.  Colonoscopy on 11/11/2011 revealed diverticulosis in the ascending, descending, and sigmoid colon.  Stool guaiacs x 3 were negative in 06/2014.  She denies any melena or hematochezia.   She has a history of varicella zoster involving the left side of her scalp.  She was admitted to [facility] from 12/21/2015 - 12/25/2015 with disseminated varicella zoster.  She was treated with IV acyclovir then switched to oral valacyclovir (completed 01/10/2016).  She is on prophylactic valacyclovir.  Symptomatically, she has chronic post neuralgia pain.  She has decreased right sided hearing.   Exam reveals a well healing lumpectomy incision.  Plan: 1.  Discuss diagnosis of DCIS.  Discuss pathology report.  Discuss plan for radiation.  As tumor is hormone receptor positive, discuss plan for 5 years of hormonal therapy (tamoxifen versus aromatase inhibitor) after completion of radiation.  Discuss side effects of each.  Discuss plan for baseline bone density study.   2.  Bone density study. 3.  RTC after radiation to initiate hormonal therapy.   Lequita Asal, MD  04/03/2016, 11:57 AM

## 2016-04-03 NOTE — Consult Note (Signed)
NEW PATIENT EVALUATION  Name: Joanna Phillips  MRN: [id number]  Date:   04/03/2016     DOB: 09-24-1950   This 65 y.o. female patient presents to the clinic for initial evaluation of stage 0 (Tis N0 M0) ductal carcinoma in situ of the right breast status post wide local excision.  REFERRING PHYSICIAN: Arlis Porta., MD  CHIEF COMPLAINT:  Chief Complaint  Patient presents with  . Breast Cancer    Pt is here for initial consultation of breast cancer.      DIAGNOSIS: The encounter diagnosis was Ductal carcinoma in situ (DCIS) of right breast.   PREVIOUS INVESTIGATIONS:  Mammograms and ultrasound reviewed Clinical notes reviewed Pathology reports reviewed  HPI: Patient is a 65 year old female with recurrent herpes zoster now limited to her scalp which is healing with current antiviral therapy. She presented with an abnormal mammogram of her right breast showing linear calcifications in the upper outer quadrant suspicious for malignancy. She underwent stereotactic biopsy positive for ductal carcinoma in situ. She went to [facility] for wide local excision showing a 4 cm area of DCIS high grade 3 with margins clear. At 2.5 mm. She is being followed by medical oncology for CLL and has an appointment today. I am not sure of the ER/PR status of her tumor we are trying to ascertain that from [facility]. She is healing well. She specifically denies breast tenderness cough or bone pain.  PLANNED TREATMENT REGIMEN: Whole breast radiation  PAST MEDICAL HISTORY:  has a past medical history of Arthritis; CLL (chronic lymphocytic leukemia) ([place]) (2011); and GERD (gastroesophageal reflux disease).    PAST SURGICAL HISTORY:  Past Surgical History:  Procedure Laterality Date  . BREAST BIOPSY Right 02/12/2016   path pending  . CHOLECYSTECTOMY  1996  . COLONOSCOPY  2013    FAMILY HISTORY: family history includes Breast cancer in her maternal aunt; Cancer in her father; Diabetes in her  mother.  SOCIAL HISTORY:  reports that she has never smoked. She has never used smokeless tobacco. She reports that she does not drink alcohol or use drugs.  ALLERGIES: Amoxicillin; Hydrocodone; Sulfamethoxazole-trimethoprim; and Allopurinol  MEDICATIONS:  Current Outpatient Prescriptions  Medication Sig Dispense Refill  . amitriptyline (ELAVIL) 50 MG tablet Take 1 tablet (50 mg total) by mouth at bedtime. 30 tablet 11  . fluticasone (FLONASE) 50 MCG/ACT nasal spray Place into both nostrils as needed.     . gabapentin (NEURONTIN) 300 MG capsule Take 2 capsules (600 mg total) by mouth 3 (three) times daily. 180 capsule 5  . loratadine (CLARITIN) 10 MG tablet Take 1 tablet (10 mg total) by mouth daily. 30 tablet 11  . omeprazole (PRILOSEC) 20 MG capsule Take 1 capsule by mouth two times daily 180 capsule 3  . valACYclovir (VALTREX) 500 MG tablet Take 1 tablet (500 mg total) by mouth 2 (two) times daily. 180 tablet 1  . ciprofloxacin (CIPRO) 500 MG tablet Take 1 tablet (500 mg total) by mouth 2 (two) times daily. (Patient not taking: Reported on 04/03/2016) 14 tablet 0   No current facility-administered medications for this encounter.     ECOG PERFORMANCE STATUS:  0 - Asymptomatic  REVIEW OF SYSTEMS: Except for the herpes zoster on her scalp which is slowly healing Patient denies any weight loss, fatigue, weakness, fever, chills or night sweats. Patient denies any loss of vision, blurred vision. Patient denies any ringing  of the ears or hearing loss. No irregular heartbeat. Patient denies heart murmur or  history of fainting. Patient denies any chest pain or pain radiating to her upper extremities. Patient denies any shortness of breath, difficulty breathing at night, cough or hemoptysis. Patient denies any swelling in the lower legs. Patient denies any nausea vomiting, vomiting of blood, or coffee ground material in the vomitus. Patient denies any stomach pain. Patient states has had normal  bowel movements no significant constipation or diarrhea. Patient denies any dysuria, hematuria or significant nocturia. Patient denies any problems walking, swelling in the joints or loss of balance. Patient denies any skin changes, loss of hair or loss of weight. Patient denies any excessive worrying or anxiety or significant depression. Patient denies any problems with insomnia. Patient denies excessive thirst, polyuria, polydipsia. Patient denies any swollen glands, patient denies easy bruising or easy bleeding. Patient denies any recent infections, allergies or URI. Patient "s visual fields have not changed significantly in recent time.   PHYSICAL EXAM: BP 127/83   Pulse 83   Temp 97.6 F (36.4 C)   Resp 20   Wt 206 lb 10.9 oz (93.8 kg)   BMI 34.39 kg/m  Patient has areas of ulceration on her scalp consistent with the herpes zoster. Her right breast is status post wide local excision and is well-healed. No dominant mass or nodularity is noted in either breast in 2 positions examined. No axillary or supraclavicular adenopathy is identified. Well-developed well-nourished patient in NAD. HEENT reveals PERLA, EOMI, discs not visualized.  Oral cavity is clear. No oral mucosal lesions are identified. Neck is clear without evidence of cervical or supraclavicular adenopathy. Lungs are clear to A&P. Cardiac examination is essentially unremarkable with regular rate and rhythm without murmur rub or thrill. Abdomen is benign with no organomegaly or masses noted. Motor sensory and DTR levels are equal and symmetric in the upper and lower extremities. Cranial nerves II through XII are grossly intact. Proprioception is intact. No peripheral adenopathy or edema is identified. No motor or sensory levels are noted. Crude visual fields are within normal range.  LABORATORY DATA: Pathology reports are reviewed    RADIOLOGY RESULTS: Mammograms and ultrasound are reviewed   IMPRESSION: Stage 0 high-grade ductal  carcinoma in situ of the right breast status post wide local excision in 65 year old female with history of CLL and herpes zoster.  PLAN: At this time I to go ahead with whole breast radiation. Patient has rather large pendulous breasts making hypofractionated treatment difficult. Would plan on delivering 5040 cGy in 28 fractions. Risks and benefits of treatment including skin reaction fatigue alteration of blood counts possible inclusion of superficial lung all were discussed in detail with the patient. I personally ordered CT simulation for after the Thanksgiving holiday. She will see medical oncology today and make a determination about post radiation anti-estrogen therapy. Patient and her husband both seem to comprehend my treatment plan well.  I would like to take this opportunity to thank you for allowing me to participate in the care of your patient.Armstead Peaks., MD

## 2016-04-05 ENCOUNTER — Encounter: Payer: Self-pay | Admitting: Hematology and Oncology

## 2016-04-07 ENCOUNTER — Telehealth: Payer: Self-pay | Admitting: Family Medicine

## 2016-04-07 ENCOUNTER — Other Ambulatory Visit: Payer: Self-pay | Admitting: Family Medicine

## 2016-04-07 MED ORDER — FLUTICASONE PROPIONATE 50 MCG/ACT NA SUSP: 2.0000 | Freq: Every day | NASAL | 6 refills | Status: DC

## 2016-04-07 NOTE — Telephone Encounter (Signed)
Yes, can call in Flonase Nasal spray to pharmacy, 1-2 sprays in each nostril once daily; 1 spray bottle with 6 refills.-jh

## 2016-04-07 NOTE — Telephone Encounter (Signed)
Pt asked if it would be possible to have flonase called to [facility].  Her call back number is [phone]

## 2016-04-15 ENCOUNTER — Ambulatory Visit
Admission: RE | Admit: 2016-04-15 | Discharge: 2016-04-15 | Disposition: A | Discharge status: Home / Self Care | Payer: 59 | Source: Ambulatory Visit | Attending: Radiation Oncology | Admitting: Radiation Oncology

## 2016-04-15 DIAGNOSIS — D0511 Intraductal carcinoma in situ of right breast: Secondary | ICD-10-CM | POA: Diagnosis not present

## 2016-04-16 ENCOUNTER — Other Ambulatory Visit: Payer: Self-pay | Admitting: *Deleted

## 2016-04-16 DIAGNOSIS — D0511 Intraductal carcinoma in situ of right breast: Secondary | ICD-10-CM

## 2016-04-17 DIAGNOSIS — D0511 Intraductal carcinoma in situ of right breast: Secondary | ICD-10-CM | POA: Diagnosis not present

## 2016-04-21 NOTE — Addendum Note (Signed)
Encounter addended by: Noreene Filbert, MD on: 04/21/2016  3:21 PM<BR>    Actions taken: LOS modified, Follow-up modified

## 2016-04-24 ENCOUNTER — Ambulatory Visit
Admission: RE | Admit: 2016-04-24 | Discharge: 2016-04-24 | Disposition: A | Discharge status: Home / Self Care | Payer: 59 | Source: Ambulatory Visit | Attending: Radiation Oncology | Admitting: Radiation Oncology

## 2016-04-24 DIAGNOSIS — D0511 Intraductal carcinoma in situ of right breast: Secondary | ICD-10-CM | POA: Diagnosis not present

## 2016-04-28 ENCOUNTER — Ambulatory Visit
Admission: RE | Admit: 2016-04-28 | Discharge: 2016-04-28 | Disposition: A | Discharge status: Home / Self Care | Payer: 59 | Source: Ambulatory Visit | Attending: Radiation Oncology | Admitting: Radiation Oncology

## 2016-04-28 DIAGNOSIS — D0511 Intraductal carcinoma in situ of right breast: Secondary | ICD-10-CM | POA: Diagnosis not present

## 2016-04-29 ENCOUNTER — Ambulatory Visit
Admission: RE | Admit: 2016-04-29 | Discharge: 2016-04-29 | Disposition: A | Discharge status: Home / Self Care | Payer: 59 | Source: Ambulatory Visit | Attending: Radiation Oncology | Admitting: Radiation Oncology

## 2016-04-29 DIAGNOSIS — D0511 Intraductal carcinoma in situ of right breast: Secondary | ICD-10-CM | POA: Diagnosis not present

## 2016-04-30 ENCOUNTER — Ambulatory Visit
Admission: RE | Admit: 2016-04-30 | Discharge: 2016-04-30 | Disposition: A | Discharge status: Home / Self Care | Payer: 59 | Source: Ambulatory Visit | Attending: Radiation Oncology | Admitting: Radiation Oncology

## 2016-04-30 DIAGNOSIS — D0511 Intraductal carcinoma in situ of right breast: Secondary | ICD-10-CM | POA: Diagnosis not present

## 2016-05-01 ENCOUNTER — Ambulatory Visit
Admission: RE | Admit: 2016-05-01 | Discharge: 2016-05-01 | Disposition: A | Discharge status: Home / Self Care | Payer: 59 | Source: Ambulatory Visit | Attending: Radiation Oncology | Admitting: Radiation Oncology

## 2016-05-01 DIAGNOSIS — D0511 Intraductal carcinoma in situ of right breast: Secondary | ICD-10-CM | POA: Diagnosis not present

## 2016-05-02 ENCOUNTER — Ambulatory Visit
Admission: RE | Admit: 2016-05-02 | Discharge: 2016-05-02 | Disposition: A | Discharge status: Home / Self Care | Payer: 59 | Source: Ambulatory Visit | Attending: Radiation Oncology | Admitting: Radiation Oncology

## 2016-05-02 DIAGNOSIS — D0511 Intraductal carcinoma in situ of right breast: Secondary | ICD-10-CM | POA: Diagnosis not present

## 2016-05-05 ENCOUNTER — Ambulatory Visit
Admission: RE | Admit: 2016-05-05 | Discharge: 2016-05-05 | Disposition: A | Discharge status: Home / Self Care | Payer: 59 | Source: Ambulatory Visit | Attending: Radiation Oncology | Admitting: Radiation Oncology

## 2016-05-05 DIAGNOSIS — D0511 Intraductal carcinoma in situ of right breast: Secondary | ICD-10-CM | POA: Diagnosis not present

## 2016-05-06 ENCOUNTER — Ambulatory Visit
Admission: RE | Admit: 2016-05-06 | Discharge: 2016-05-06 | Disposition: A | Discharge status: Home / Self Care | Payer: 59 | Source: Ambulatory Visit | Attending: Radiation Oncology | Admitting: Radiation Oncology

## 2016-05-06 DIAGNOSIS — D0511 Intraductal carcinoma in situ of right breast: Secondary | ICD-10-CM | POA: Diagnosis not present

## 2016-05-07 ENCOUNTER — Ambulatory Visit
Admission: RE | Admit: 2016-05-07 | Discharge: 2016-05-07 | Disposition: A | Discharge status: Home / Self Care | Payer: 59 | Source: Ambulatory Visit | Attending: Radiation Oncology | Admitting: Radiation Oncology

## 2016-05-07 ENCOUNTER — Other Ambulatory Visit: Payer: Self-pay | Admitting: Family Medicine

## 2016-05-07 DIAGNOSIS — B027 Disseminated zoster: Secondary | ICD-10-CM

## 2016-05-07 DIAGNOSIS — B0229 Other postherpetic nervous system involvement: Secondary | ICD-10-CM

## 2016-05-07 DIAGNOSIS — D0511 Intraductal carcinoma in situ of right breast: Secondary | ICD-10-CM | POA: Diagnosis not present

## 2016-05-07 MED ORDER — VALACYCLOVIR HCL 500 MG PO TABS: 500.0000 mg | ORAL_TABLET | Freq: Two times a day (BID) | ORAL | 1 refills | Status: DC

## 2016-05-08 ENCOUNTER — Other Ambulatory Visit: Payer: Self-pay | Admitting: *Deleted

## 2016-05-08 ENCOUNTER — Ambulatory Visit
Admission: RE | Admit: 2016-05-08 | Discharge: 2016-05-08 | Disposition: A | Discharge status: Home / Self Care | Payer: 59 | Source: Ambulatory Visit | Attending: Radiation Oncology | Admitting: Radiation Oncology

## 2016-05-08 DIAGNOSIS — D0511 Intraductal carcinoma in situ of right breast: Secondary | ICD-10-CM | POA: Diagnosis not present

## 2016-05-08 MED ORDER — AZITHROMYCIN 250 MG PO TABS: ORAL_TABLET | ORAL | 0 refills | Status: DC

## 2016-05-09 ENCOUNTER — Ambulatory Visit
Admission: RE | Admit: 2016-05-09 | Discharge: 2016-05-09 | Disposition: A | Discharge status: Home / Self Care | Payer: 59 | Source: Ambulatory Visit | Attending: Radiation Oncology | Admitting: Radiation Oncology

## 2016-05-09 DIAGNOSIS — D0511 Intraductal carcinoma in situ of right breast: Secondary | ICD-10-CM | POA: Diagnosis not present

## 2016-05-13 ENCOUNTER — Ambulatory Visit
Admission: RE | Admit: 2016-05-13 | Discharge: 2016-05-13 | Disposition: A | Discharge status: Home / Self Care | Payer: 59 | Source: Ambulatory Visit | Attending: Radiation Oncology | Admitting: Radiation Oncology

## 2016-05-13 DIAGNOSIS — D0511 Intraductal carcinoma in situ of right breast: Secondary | ICD-10-CM | POA: Diagnosis not present

## 2016-05-14 ENCOUNTER — Other Ambulatory Visit: Payer: 59

## 2016-05-14 ENCOUNTER — Ambulatory Visit
Admission: RE | Admit: 2016-05-14 | Discharge: 2016-05-14 | Disposition: A | Discharge status: Home / Self Care | Payer: 59 | Source: Ambulatory Visit | Attending: Radiation Oncology | Admitting: Radiation Oncology

## 2016-05-14 DIAGNOSIS — D0511 Intraductal carcinoma in situ of right breast: Secondary | ICD-10-CM | POA: Diagnosis not present

## 2016-05-15 ENCOUNTER — Ambulatory Visit
Admission: RE | Admit: 2016-05-15 | Discharge: 2016-05-15 | Disposition: A | Discharge status: Home / Self Care | Payer: 59 | Source: Ambulatory Visit | Attending: Radiation Oncology | Admitting: Radiation Oncology

## 2016-05-15 DIAGNOSIS — D0511 Intraductal carcinoma in situ of right breast: Secondary | ICD-10-CM | POA: Diagnosis not present

## 2016-05-16 ENCOUNTER — Encounter: Payer: Self-pay | Admitting: Radiation Oncology

## 2016-05-16 ENCOUNTER — Ambulatory Visit
Admission: RE | Admit: 2016-05-16 | Discharge: 2016-05-16 | Disposition: A | Discharge status: Home / Self Care | Payer: 59 | Source: Ambulatory Visit | Attending: Radiation Oncology | Admitting: Radiation Oncology

## 2016-05-16 DIAGNOSIS — D0511 Intraductal carcinoma in situ of right breast: Secondary | ICD-10-CM | POA: Diagnosis not present

## 2016-05-20 ENCOUNTER — Ambulatory Visit
Admission: RE | Admit: 2016-05-20 | Discharge: 2016-05-20 | Disposition: A | Discharge status: Home / Self Care | Payer: 59 | Source: Ambulatory Visit | Attending: Radiation Oncology | Admitting: Radiation Oncology

## 2016-05-20 DIAGNOSIS — Z79899 Other long term (current) drug therapy: Secondary | ICD-10-CM | POA: Diagnosis not present

## 2016-05-20 DIAGNOSIS — C9110 Chronic lymphocytic leukemia of B-cell type not having achieved remission: Secondary | ICD-10-CM | POA: Diagnosis not present

## 2016-05-20 DIAGNOSIS — K219 Gastro-esophageal reflux disease without esophagitis: Secondary | ICD-10-CM | POA: Diagnosis not present

## 2016-05-20 DIAGNOSIS — Z803 Family history of malignant neoplasm of breast: Secondary | ICD-10-CM | POA: Diagnosis not present

## 2016-05-20 DIAGNOSIS — D0511 Intraductal carcinoma in situ of right breast: Secondary | ICD-10-CM | POA: Diagnosis not present

## 2016-05-20 DIAGNOSIS — Z510 Encounter for antineoplastic radiation therapy: Secondary | ICD-10-CM | POA: Diagnosis not present

## 2016-05-20 DIAGNOSIS — M129 Arthropathy, unspecified: Secondary | ICD-10-CM | POA: Diagnosis not present

## 2016-05-20 DIAGNOSIS — Z9049 Acquired absence of other specified parts of digestive tract: Secondary | ICD-10-CM | POA: Diagnosis not present

## 2016-05-20 DIAGNOSIS — Z809 Family history of malignant neoplasm, unspecified: Secondary | ICD-10-CM | POA: Diagnosis not present

## 2016-05-21 ENCOUNTER — Ambulatory Visit
Admission: RE | Admit: 2016-05-21 | Discharge: 2016-05-21 | Disposition: A | Discharge status: Home / Self Care | Payer: 59 | Source: Ambulatory Visit | Attending: Radiation Oncology | Admitting: Radiation Oncology

## 2016-05-21 ENCOUNTER — Ambulatory Visit
Admission: RE | Admit: 2016-05-21 | Discharge: 2016-05-21 | Disposition: A | Discharge status: Home / Self Care | Payer: Medicare Other | Source: Ambulatory Visit | Attending: Hematology and Oncology | Admitting: Hematology and Oncology

## 2016-05-21 DIAGNOSIS — D0511 Intraductal carcinoma in situ of right breast: Secondary | ICD-10-CM | POA: Diagnosis not present

## 2016-05-21 DIAGNOSIS — Z510 Encounter for antineoplastic radiation therapy: Secondary | ICD-10-CM | POA: Diagnosis not present

## 2016-05-21 DIAGNOSIS — Z79899 Other long term (current) drug therapy: Secondary | ICD-10-CM | POA: Diagnosis not present

## 2016-05-21 DIAGNOSIS — M129 Arthropathy, unspecified: Secondary | ICD-10-CM | POA: Diagnosis not present

## 2016-05-21 DIAGNOSIS — K219 Gastro-esophageal reflux disease without esophagitis: Secondary | ICD-10-CM | POA: Diagnosis not present

## 2016-05-21 DIAGNOSIS — C9110 Chronic lymphocytic leukemia of B-cell type not having achieved remission: Secondary | ICD-10-CM | POA: Diagnosis not present

## 2016-05-21 DIAGNOSIS — Z780 Asymptomatic menopausal state: Secondary | ICD-10-CM | POA: Diagnosis not present

## 2016-05-22 ENCOUNTER — Ambulatory Visit
Admission: RE | Admit: 2016-05-22 | Discharge: 2016-05-22 | Disposition: A | Discharge status: Home / Self Care | Payer: 59 | Source: Ambulatory Visit | Attending: Radiation Oncology | Admitting: Radiation Oncology

## 2016-05-22 DIAGNOSIS — Z510 Encounter for antineoplastic radiation therapy: Secondary | ICD-10-CM | POA: Diagnosis not present

## 2016-05-22 DIAGNOSIS — D0511 Intraductal carcinoma in situ of right breast: Secondary | ICD-10-CM | POA: Diagnosis not present

## 2016-05-22 DIAGNOSIS — K219 Gastro-esophageal reflux disease without esophagitis: Secondary | ICD-10-CM | POA: Diagnosis not present

## 2016-05-22 DIAGNOSIS — Z79899 Other long term (current) drug therapy: Secondary | ICD-10-CM | POA: Diagnosis not present

## 2016-05-22 DIAGNOSIS — C9110 Chronic lymphocytic leukemia of B-cell type not having achieved remission: Secondary | ICD-10-CM | POA: Diagnosis not present

## 2016-05-22 DIAGNOSIS — M129 Arthropathy, unspecified: Secondary | ICD-10-CM | POA: Diagnosis not present

## 2016-05-23 ENCOUNTER — Ambulatory Visit
Admission: RE | Admit: 2016-05-23 | Discharge: 2016-05-23 | Disposition: A | Discharge status: Home / Self Care | Payer: 59 | Source: Ambulatory Visit | Attending: Radiation Oncology | Admitting: Radiation Oncology

## 2016-05-23 DIAGNOSIS — K219 Gastro-esophageal reflux disease without esophagitis: Secondary | ICD-10-CM | POA: Diagnosis not present

## 2016-05-23 DIAGNOSIS — M129 Arthropathy, unspecified: Secondary | ICD-10-CM | POA: Diagnosis not present

## 2016-05-23 DIAGNOSIS — Z79899 Other long term (current) drug therapy: Secondary | ICD-10-CM | POA: Diagnosis not present

## 2016-05-23 DIAGNOSIS — D0511 Intraductal carcinoma in situ of right breast: Secondary | ICD-10-CM | POA: Diagnosis not present

## 2016-05-23 DIAGNOSIS — C9110 Chronic lymphocytic leukemia of B-cell type not having achieved remission: Secondary | ICD-10-CM | POA: Diagnosis not present

## 2016-05-23 DIAGNOSIS — Z510 Encounter for antineoplastic radiation therapy: Secondary | ICD-10-CM | POA: Diagnosis not present

## 2016-05-26 ENCOUNTER — Ambulatory Visit
Admission: RE | Admit: 2016-05-26 | Discharge: 2016-05-26 | Disposition: A | Discharge status: Home / Self Care | Payer: 59 | Source: Ambulatory Visit | Attending: Radiation Oncology | Admitting: Radiation Oncology

## 2016-05-26 DIAGNOSIS — M129 Arthropathy, unspecified: Secondary | ICD-10-CM | POA: Diagnosis not present

## 2016-05-26 DIAGNOSIS — Z510 Encounter for antineoplastic radiation therapy: Secondary | ICD-10-CM | POA: Diagnosis not present

## 2016-05-26 DIAGNOSIS — D0511 Intraductal carcinoma in situ of right breast: Secondary | ICD-10-CM | POA: Diagnosis not present

## 2016-05-26 DIAGNOSIS — K219 Gastro-esophageal reflux disease without esophagitis: Secondary | ICD-10-CM | POA: Diagnosis not present

## 2016-05-26 DIAGNOSIS — Z79899 Other long term (current) drug therapy: Secondary | ICD-10-CM | POA: Diagnosis not present

## 2016-05-26 DIAGNOSIS — C9110 Chronic lymphocytic leukemia of B-cell type not having achieved remission: Secondary | ICD-10-CM | POA: Diagnosis not present

## 2016-05-27 ENCOUNTER — Ambulatory Visit
Admission: RE | Admit: 2016-05-27 | Discharge: 2016-05-27 | Disposition: A | Discharge status: Home / Self Care | Payer: 59 | Source: Ambulatory Visit | Attending: Radiation Oncology | Admitting: Radiation Oncology

## 2016-05-27 DIAGNOSIS — Z79899 Other long term (current) drug therapy: Secondary | ICD-10-CM | POA: Diagnosis not present

## 2016-05-27 DIAGNOSIS — K219 Gastro-esophageal reflux disease without esophagitis: Secondary | ICD-10-CM | POA: Diagnosis not present

## 2016-05-27 DIAGNOSIS — C9110 Chronic lymphocytic leukemia of B-cell type not having achieved remission: Secondary | ICD-10-CM | POA: Diagnosis not present

## 2016-05-27 DIAGNOSIS — D0511 Intraductal carcinoma in situ of right breast: Secondary | ICD-10-CM | POA: Diagnosis not present

## 2016-05-27 DIAGNOSIS — Z510 Encounter for antineoplastic radiation therapy: Secondary | ICD-10-CM | POA: Diagnosis not present

## 2016-05-27 DIAGNOSIS — M129 Arthropathy, unspecified: Secondary | ICD-10-CM | POA: Diagnosis not present

## 2016-05-28 ENCOUNTER — Other Ambulatory Visit: Payer: 59

## 2016-05-28 ENCOUNTER — Ambulatory Visit
Admission: RE | Admit: 2016-05-28 | Discharge: 2016-05-28 | Disposition: A | Discharge status: Home / Self Care | Payer: 59 | Source: Ambulatory Visit | Attending: Radiation Oncology | Admitting: Radiation Oncology

## 2016-05-28 DIAGNOSIS — K219 Gastro-esophageal reflux disease without esophagitis: Secondary | ICD-10-CM | POA: Diagnosis not present

## 2016-05-28 DIAGNOSIS — D0511 Intraductal carcinoma in situ of right breast: Secondary | ICD-10-CM | POA: Diagnosis not present

## 2016-05-28 DIAGNOSIS — M129 Arthropathy, unspecified: Secondary | ICD-10-CM | POA: Diagnosis not present

## 2016-05-28 DIAGNOSIS — Z79899 Other long term (current) drug therapy: Secondary | ICD-10-CM | POA: Diagnosis not present

## 2016-05-28 DIAGNOSIS — C9110 Chronic lymphocytic leukemia of B-cell type not having achieved remission: Secondary | ICD-10-CM | POA: Diagnosis not present

## 2016-05-28 DIAGNOSIS — Z510 Encounter for antineoplastic radiation therapy: Secondary | ICD-10-CM | POA: Diagnosis not present

## 2016-05-28 DIAGNOSIS — D696 Thrombocytopenia, unspecified: Secondary | ICD-10-CM | POA: Diagnosis not present

## 2016-05-29 ENCOUNTER — Ambulatory Visit
Admission: RE | Admit: 2016-05-29 | Discharge: 2016-05-29 | Disposition: A | Discharge status: Home / Self Care | Payer: 59 | Source: Ambulatory Visit | Attending: Radiation Oncology | Admitting: Radiation Oncology

## 2016-05-29 DIAGNOSIS — Z510 Encounter for antineoplastic radiation therapy: Secondary | ICD-10-CM | POA: Diagnosis not present

## 2016-05-29 DIAGNOSIS — D0511 Intraductal carcinoma in situ of right breast: Secondary | ICD-10-CM | POA: Diagnosis not present

## 2016-05-29 DIAGNOSIS — K219 Gastro-esophageal reflux disease without esophagitis: Secondary | ICD-10-CM | POA: Diagnosis not present

## 2016-05-29 DIAGNOSIS — C9110 Chronic lymphocytic leukemia of B-cell type not having achieved remission: Secondary | ICD-10-CM | POA: Diagnosis not present

## 2016-05-29 DIAGNOSIS — M129 Arthropathy, unspecified: Secondary | ICD-10-CM | POA: Diagnosis not present

## 2016-05-29 DIAGNOSIS — Z79899 Other long term (current) drug therapy: Secondary | ICD-10-CM | POA: Diagnosis not present

## 2016-05-30 ENCOUNTER — Ambulatory Visit
Admission: RE | Admit: 2016-05-30 | Discharge: 2016-05-30 | Disposition: A | Discharge status: Home / Self Care | Payer: 59 | Source: Ambulatory Visit | Attending: Radiation Oncology | Admitting: Radiation Oncology

## 2016-05-30 DIAGNOSIS — C9110 Chronic lymphocytic leukemia of B-cell type not having achieved remission: Secondary | ICD-10-CM | POA: Diagnosis not present

## 2016-05-30 DIAGNOSIS — K219 Gastro-esophageal reflux disease without esophagitis: Secondary | ICD-10-CM | POA: Diagnosis not present

## 2016-05-30 DIAGNOSIS — M129 Arthropathy, unspecified: Secondary | ICD-10-CM | POA: Diagnosis not present

## 2016-05-30 DIAGNOSIS — D0511 Intraductal carcinoma in situ of right breast: Secondary | ICD-10-CM | POA: Diagnosis not present

## 2016-05-30 DIAGNOSIS — Z79899 Other long term (current) drug therapy: Secondary | ICD-10-CM | POA: Diagnosis not present

## 2016-05-30 DIAGNOSIS — Z510 Encounter for antineoplastic radiation therapy: Secondary | ICD-10-CM | POA: Diagnosis not present

## 2016-06-02 ENCOUNTER — Ambulatory Visit
Admission: RE | Admit: 2016-06-02 | Discharge: 2016-06-02 | Disposition: A | Discharge status: Home / Self Care | Payer: 59 | Source: Ambulatory Visit | Attending: Radiation Oncology | Admitting: Radiation Oncology

## 2016-06-02 DIAGNOSIS — K219 Gastro-esophageal reflux disease without esophagitis: Secondary | ICD-10-CM | POA: Diagnosis not present

## 2016-06-02 DIAGNOSIS — C9110 Chronic lymphocytic leukemia of B-cell type not having achieved remission: Secondary | ICD-10-CM | POA: Diagnosis not present

## 2016-06-02 DIAGNOSIS — Z79899 Other long term (current) drug therapy: Secondary | ICD-10-CM | POA: Diagnosis not present

## 2016-06-02 DIAGNOSIS — M129 Arthropathy, unspecified: Secondary | ICD-10-CM | POA: Diagnosis not present

## 2016-06-02 DIAGNOSIS — D0511 Intraductal carcinoma in situ of right breast: Secondary | ICD-10-CM | POA: Diagnosis not present

## 2016-06-02 DIAGNOSIS — Z510 Encounter for antineoplastic radiation therapy: Secondary | ICD-10-CM | POA: Diagnosis not present

## 2016-06-03 ENCOUNTER — Other Ambulatory Visit: Payer: Self-pay | Admitting: *Deleted

## 2016-06-03 ENCOUNTER — Ambulatory Visit: Payer: 59

## 2016-06-03 ENCOUNTER — Ambulatory Visit: Admission: RE | Admit: 2016-06-03 | Payer: 59 | Source: Ambulatory Visit

## 2016-06-03 ENCOUNTER — Ambulatory Visit
Admission: RE | Admit: 2016-06-03 | Discharge: 2016-06-03 | Disposition: A | Discharge status: Home / Self Care | Payer: 59 | Source: Ambulatory Visit | Attending: Radiation Oncology | Admitting: Radiation Oncology

## 2016-06-03 MED ORDER — SILVER SULFADIAZINE 1 % EX CREA: 1.0000 "application " | TOPICAL_CREAM | Freq: Two times a day (BID) | CUTANEOUS | 2 refills | Status: DC

## 2016-06-04 ENCOUNTER — Ambulatory Visit: Payer: 59

## 2016-06-04 ENCOUNTER — Ambulatory Visit
Admission: RE | Admit: 2016-06-04 | Discharge: 2016-06-04 | Disposition: A | Discharge status: Home / Self Care | Payer: 59 | Source: Ambulatory Visit | Attending: Radiation Oncology | Admitting: Radiation Oncology

## 2016-06-04 DIAGNOSIS — Z79899 Other long term (current) drug therapy: Secondary | ICD-10-CM | POA: Diagnosis not present

## 2016-06-04 DIAGNOSIS — Z510 Encounter for antineoplastic radiation therapy: Secondary | ICD-10-CM | POA: Diagnosis not present

## 2016-06-04 DIAGNOSIS — C9110 Chronic lymphocytic leukemia of B-cell type not having achieved remission: Secondary | ICD-10-CM | POA: Diagnosis not present

## 2016-06-04 DIAGNOSIS — M129 Arthropathy, unspecified: Secondary | ICD-10-CM | POA: Diagnosis not present

## 2016-06-04 DIAGNOSIS — D0511 Intraductal carcinoma in situ of right breast: Secondary | ICD-10-CM | POA: Diagnosis not present

## 2016-06-04 DIAGNOSIS — K219 Gastro-esophageal reflux disease without esophagitis: Secondary | ICD-10-CM | POA: Diagnosis not present

## 2016-06-05 ENCOUNTER — Ambulatory Visit: Payer: 59

## 2016-06-06 ENCOUNTER — Ambulatory Visit: Payer: 59

## 2016-06-06 ENCOUNTER — Ambulatory Visit
Admission: RE | Admit: 2016-06-06 | Discharge: 2016-06-06 | Disposition: A | Discharge status: Home / Self Care | Payer: 59 | Source: Ambulatory Visit | Attending: Radiation Oncology | Admitting: Radiation Oncology

## 2016-06-06 DIAGNOSIS — D0511 Intraductal carcinoma in situ of right breast: Secondary | ICD-10-CM | POA: Diagnosis not present

## 2016-06-06 DIAGNOSIS — Z510 Encounter for antineoplastic radiation therapy: Secondary | ICD-10-CM | POA: Diagnosis not present

## 2016-06-06 DIAGNOSIS — M129 Arthropathy, unspecified: Secondary | ICD-10-CM | POA: Diagnosis not present

## 2016-06-06 DIAGNOSIS — K219 Gastro-esophageal reflux disease without esophagitis: Secondary | ICD-10-CM | POA: Diagnosis not present

## 2016-06-06 DIAGNOSIS — C9110 Chronic lymphocytic leukemia of B-cell type not having achieved remission: Secondary | ICD-10-CM | POA: Diagnosis not present

## 2016-06-06 DIAGNOSIS — Z79899 Other long term (current) drug therapy: Secondary | ICD-10-CM | POA: Diagnosis not present

## 2016-06-09 ENCOUNTER — Ambulatory Visit: Payer: 59

## 2016-06-09 ENCOUNTER — Ambulatory Visit
Admission: RE | Admit: 2016-06-09 | Discharge: 2016-06-09 | Disposition: A | Discharge status: Home / Self Care | Payer: 59 | Source: Ambulatory Visit | Attending: Radiation Oncology | Admitting: Radiation Oncology

## 2016-06-09 DIAGNOSIS — M129 Arthropathy, unspecified: Secondary | ICD-10-CM | POA: Diagnosis not present

## 2016-06-09 DIAGNOSIS — K219 Gastro-esophageal reflux disease without esophagitis: Secondary | ICD-10-CM | POA: Diagnosis not present

## 2016-06-09 DIAGNOSIS — C9110 Chronic lymphocytic leukemia of B-cell type not having achieved remission: Secondary | ICD-10-CM | POA: Diagnosis not present

## 2016-06-09 DIAGNOSIS — D0511 Intraductal carcinoma in situ of right breast: Secondary | ICD-10-CM | POA: Diagnosis not present

## 2016-06-09 DIAGNOSIS — Z510 Encounter for antineoplastic radiation therapy: Secondary | ICD-10-CM | POA: Diagnosis not present

## 2016-06-09 DIAGNOSIS — Z79899 Other long term (current) drug therapy: Secondary | ICD-10-CM | POA: Diagnosis not present

## 2016-06-10 ENCOUNTER — Ambulatory Visit
Admission: RE | Admit: 2016-06-10 | Discharge: 2016-06-10 | Disposition: A | Discharge status: Home / Self Care | Payer: 59 | Source: Ambulatory Visit | Attending: Radiation Oncology | Admitting: Radiation Oncology

## 2016-06-10 DIAGNOSIS — D0511 Intraductal carcinoma in situ of right breast: Secondary | ICD-10-CM | POA: Diagnosis not present

## 2016-06-10 DIAGNOSIS — K219 Gastro-esophageal reflux disease without esophagitis: Secondary | ICD-10-CM | POA: Diagnosis not present

## 2016-06-10 DIAGNOSIS — M129 Arthropathy, unspecified: Secondary | ICD-10-CM | POA: Diagnosis not present

## 2016-06-10 DIAGNOSIS — Z79899 Other long term (current) drug therapy: Secondary | ICD-10-CM | POA: Diagnosis not present

## 2016-06-10 DIAGNOSIS — Z510 Encounter for antineoplastic radiation therapy: Secondary | ICD-10-CM | POA: Diagnosis not present

## 2016-06-10 DIAGNOSIS — C9110 Chronic lymphocytic leukemia of B-cell type not having achieved remission: Secondary | ICD-10-CM | POA: Diagnosis not present

## 2016-06-11 ENCOUNTER — Encounter: Payer: Self-pay | Admitting: Hematology and Oncology

## 2016-06-11 ENCOUNTER — Ambulatory Visit
Admission: RE | Admit: 2016-06-11 | Discharge: 2016-06-11 | Disposition: A | Discharge status: Home / Self Care | Payer: 59 | Source: Ambulatory Visit | Attending: Radiation Oncology | Admitting: Radiation Oncology

## 2016-06-11 ENCOUNTER — Other Ambulatory Visit: Payer: 59

## 2016-06-11 DIAGNOSIS — D649 Anemia, unspecified: Secondary | ICD-10-CM | POA: Diagnosis not present

## 2016-06-11 DIAGNOSIS — K219 Gastro-esophageal reflux disease without esophagitis: Secondary | ICD-10-CM | POA: Diagnosis not present

## 2016-06-11 DIAGNOSIS — Z79899 Other long term (current) drug therapy: Secondary | ICD-10-CM | POA: Diagnosis not present

## 2016-06-11 DIAGNOSIS — Z510 Encounter for antineoplastic radiation therapy: Secondary | ICD-10-CM | POA: Diagnosis not present

## 2016-06-11 DIAGNOSIS — D0511 Intraductal carcinoma in situ of right breast: Secondary | ICD-10-CM | POA: Diagnosis not present

## 2016-06-11 DIAGNOSIS — M129 Arthropathy, unspecified: Secondary | ICD-10-CM | POA: Diagnosis not present

## 2016-06-11 DIAGNOSIS — C9110 Chronic lymphocytic leukemia of B-cell type not having achieved remission: Secondary | ICD-10-CM | POA: Diagnosis not present

## 2016-06-12 ENCOUNTER — Ambulatory Visit: Payer: 59

## 2016-06-12 DIAGNOSIS — C9110 Chronic lymphocytic leukemia of B-cell type not having achieved remission: Secondary | ICD-10-CM | POA: Diagnosis not present

## 2016-06-12 DIAGNOSIS — D0511 Intraductal carcinoma in situ of right breast: Secondary | ICD-10-CM | POA: Diagnosis not present

## 2016-06-12 DIAGNOSIS — Z79899 Other long term (current) drug therapy: Secondary | ICD-10-CM | POA: Diagnosis not present

## 2016-06-12 DIAGNOSIS — M129 Arthropathy, unspecified: Secondary | ICD-10-CM | POA: Diagnosis not present

## 2016-06-12 DIAGNOSIS — K219 Gastro-esophageal reflux disease without esophagitis: Secondary | ICD-10-CM | POA: Diagnosis not present

## 2016-06-12 DIAGNOSIS — Z510 Encounter for antineoplastic radiation therapy: Secondary | ICD-10-CM | POA: Diagnosis not present

## 2016-06-13 ENCOUNTER — Ambulatory Visit: Payer: 59

## 2016-06-13 DIAGNOSIS — D0511 Intraductal carcinoma in situ of right breast: Secondary | ICD-10-CM | POA: Diagnosis not present

## 2016-06-13 DIAGNOSIS — Z79899 Other long term (current) drug therapy: Secondary | ICD-10-CM | POA: Diagnosis not present

## 2016-06-13 DIAGNOSIS — C9110 Chronic lymphocytic leukemia of B-cell type not having achieved remission: Secondary | ICD-10-CM | POA: Diagnosis not present

## 2016-06-13 DIAGNOSIS — Z510 Encounter for antineoplastic radiation therapy: Secondary | ICD-10-CM | POA: Diagnosis not present

## 2016-06-13 DIAGNOSIS — M129 Arthropathy, unspecified: Secondary | ICD-10-CM | POA: Diagnosis not present

## 2016-06-13 DIAGNOSIS — K219 Gastro-esophageal reflux disease without esophagitis: Secondary | ICD-10-CM | POA: Diagnosis not present

## 2016-06-16 ENCOUNTER — Ambulatory Visit: Payer: 59 | Admitting: Hematology and Oncology

## 2016-06-16 ENCOUNTER — Ambulatory Visit: Payer: 59

## 2016-06-16 DIAGNOSIS — Z79899 Other long term (current) drug therapy: Secondary | ICD-10-CM | POA: Diagnosis not present

## 2016-06-16 DIAGNOSIS — C9110 Chronic lymphocytic leukemia of B-cell type not having achieved remission: Secondary | ICD-10-CM | POA: Diagnosis not present

## 2016-06-16 DIAGNOSIS — K219 Gastro-esophageal reflux disease without esophagitis: Secondary | ICD-10-CM | POA: Diagnosis not present

## 2016-06-16 DIAGNOSIS — Z510 Encounter for antineoplastic radiation therapy: Secondary | ICD-10-CM | POA: Diagnosis not present

## 2016-06-16 DIAGNOSIS — D0511 Intraductal carcinoma in situ of right breast: Secondary | ICD-10-CM | POA: Diagnosis not present

## 2016-06-16 DIAGNOSIS — M129 Arthropathy, unspecified: Secondary | ICD-10-CM | POA: Diagnosis not present

## 2016-06-17 ENCOUNTER — Ambulatory Visit: Payer: 59

## 2016-06-17 ENCOUNTER — Ambulatory Visit
Admission: RE | Admit: 2016-06-17 | Discharge: 2016-06-17 | Disposition: A | Discharge status: Home / Self Care | Payer: 59 | Source: Ambulatory Visit | Attending: Radiation Oncology | Admitting: Radiation Oncology

## 2016-06-18 ENCOUNTER — Ambulatory Visit: Payer: 59

## 2016-06-18 ENCOUNTER — Ambulatory Visit: Admission: RE | Admit: 2016-06-18 | Payer: 59 | Source: Ambulatory Visit

## 2016-06-19 ENCOUNTER — Ambulatory Visit: Payer: 59

## 2016-06-19 ENCOUNTER — Ambulatory Visit
Admission: RE | Admit: 2016-06-19 | Discharge: 2016-06-19 | Disposition: A | Discharge status: Home / Self Care | Payer: 59 | Source: Ambulatory Visit | Attending: Radiation Oncology | Admitting: Radiation Oncology

## 2016-06-19 DIAGNOSIS — C9110 Chronic lymphocytic leukemia of B-cell type not having achieved remission: Secondary | ICD-10-CM | POA: Diagnosis not present

## 2016-06-19 DIAGNOSIS — Z79899 Other long term (current) drug therapy: Secondary | ICD-10-CM | POA: Diagnosis not present

## 2016-06-19 DIAGNOSIS — D0511 Intraductal carcinoma in situ of right breast: Secondary | ICD-10-CM | POA: Diagnosis not present

## 2016-06-19 DIAGNOSIS — M129 Arthropathy, unspecified: Secondary | ICD-10-CM | POA: Diagnosis not present

## 2016-06-19 DIAGNOSIS — K219 Gastro-esophageal reflux disease without esophagitis: Secondary | ICD-10-CM | POA: Diagnosis not present

## 2016-06-19 DIAGNOSIS — Z510 Encounter for antineoplastic radiation therapy: Secondary | ICD-10-CM | POA: Diagnosis not present

## 2016-06-20 ENCOUNTER — Inpatient Hospital Stay: Payer: Medicare Other | Attending: Hematology and Oncology | Admitting: Hematology and Oncology

## 2016-06-20 ENCOUNTER — Ambulatory Visit: Payer: 59

## 2016-06-20 ENCOUNTER — Encounter: Payer: Self-pay | Admitting: Hematology and Oncology

## 2016-06-20 ENCOUNTER — Ambulatory Visit
Admission: RE | Admit: 2016-06-20 | Discharge: 2016-06-20 | Disposition: A | Discharge status: Home / Self Care | Payer: 59 | Source: Ambulatory Visit | Attending: Radiation Oncology | Admitting: Radiation Oncology

## 2016-06-20 VITALS — BP 126/81 | HR 99 | Temp 97.6°F | Resp 18 | Wt 214.3 lb

## 2016-06-20 DIAGNOSIS — Z801 Family history of malignant neoplasm of trachea, bronchus and lung: Secondary | ICD-10-CM | POA: Insufficient documentation

## 2016-06-20 DIAGNOSIS — D0511 Intraductal carcinoma in situ of right breast: Secondary | ICD-10-CM | POA: Diagnosis not present

## 2016-06-20 DIAGNOSIS — C9110 Chronic lymphocytic leukemia of B-cell type not having achieved remission: Secondary | ICD-10-CM | POA: Insufficient documentation

## 2016-06-20 DIAGNOSIS — Z9049 Acquired absence of other specified parts of digestive tract: Secondary | ICD-10-CM | POA: Diagnosis not present

## 2016-06-20 DIAGNOSIS — B0229 Other postherpetic nervous system involvement: Secondary | ICD-10-CM | POA: Diagnosis not present

## 2016-06-20 DIAGNOSIS — Z803 Family history of malignant neoplasm of breast: Secondary | ICD-10-CM | POA: Insufficient documentation

## 2016-06-20 DIAGNOSIS — Z79899 Other long term (current) drug therapy: Secondary | ICD-10-CM | POA: Diagnosis not present

## 2016-06-20 DIAGNOSIS — K219 Gastro-esophageal reflux disease without esophagitis: Secondary | ICD-10-CM | POA: Diagnosis not present

## 2016-06-20 DIAGNOSIS — Z923 Personal history of irradiation: Secondary | ICD-10-CM | POA: Insufficient documentation

## 2016-06-20 DIAGNOSIS — M129 Arthropathy, unspecified: Secondary | ICD-10-CM | POA: Diagnosis not present

## 2016-06-20 DIAGNOSIS — Z510 Encounter for antineoplastic radiation therapy: Secondary | ICD-10-CM | POA: Diagnosis not present

## 2016-06-20 MED ORDER — ANASTROZOLE 1 MG PO TABS: 1.0000 mg | ORAL_TABLET | Freq: Every day | ORAL | 2 refills | Status: DC

## 2016-06-20 NOTE — Patient Instructions (Signed)

## 2016-06-20 NOTE — Progress Notes (Signed)
Here for follow up.in good spirits.

## 2016-06-20 NOTE — Progress Notes (Signed)
[place] [facility] day:  06/20/16  Chief Complaint: Joanna Phillips is a 66 y.o. female with chronic lymphocytic leukemia (CLL) and right breast DCIS who is seen for assessment during breast radiation.  HPI:  The patient was last seen in the medical oncology clinic on 04/03/2016.  At that time, she has chronic post herpetic neuralgia pain.  She had decreased right sided hearing.   Exam revealed a well healing lumpectomy incision.  She was to start breast radiation after Thanksgiving.  We discussed hormonal therapy post radiation.  A baseline bone density study was ordered.  Bone density study on 05/21/2016 was normal with a T score of -0.3 in the AP spine L1-L4 and -0.3 in the left femoral neck.  She has been undergoing radiation.  Radiation completes on 06/26/2016.  She had some blistering requiring radiation to be on hold x 3 days.  She denies any breast concerns.  She continues to have significant pain associated with her scalp and right side of her face associated with a post herpetic neuralgia. She is going to the wound clinic at [facility] on 06/23/2016.   Past Medical History:  Diagnosis Date  . Arthritis   . CLL (chronic lymphocytic leukemia) ([place]) 2011  . DCIS (ductal carcinoma in situ) 03/11/2016  . GERD (gastroesophageal reflux disease)     Past Surgical History:  Procedure Laterality Date  . BREAST BIOPSY Right 02/12/2016   path pending  . CHOLECYSTECTOMY  1996  . COLONOSCOPY  2013    Family History  Problem Relation Age of Onset  . Cancer Father     lung  . Diabetes Mother   . Breast cancer Maternal Aunt     Social History:  reports that she has never smoked. She has never used smokeless tobacco. She reports that she does not drink alcohol or use drugs.  The patient is accompanied by her husband, [name], today.  Allergies:  Allergies  Allergen Reactions  . Amoxicillin Hives  . Hydrocodone Itching  . Sulfamethoxazole-Trimethoprim  Nausea Only  . Allopurinol Rash    Current Medications: Current Outpatient Prescriptions  Medication Sig Dispense Refill  . amitriptyline (ELAVIL) 50 MG tablet Take 1 tablet (50 mg total) by mouth at bedtime. 30 tablet 11  . fluticasone (FLONASE) 50 MCG/ACT nasal spray Place 2 sprays into both nostrils daily. 16 g 6  . gabapentin (NEURONTIN) 300 MG capsule Take 2 capsules (600 mg total) by mouth 3 (three) times daily. 180 capsule 5  . loratadine (CLARITIN) 10 MG tablet Take 1 tablet (10 mg total) by mouth daily. 30 tablet 11  . omeprazole (PRILOSEC) 20 MG capsule Take 1 capsule by mouth two times daily 180 capsule 3  . silver sulfADIAZINE (SILVADENE) 1 % cream Apply 1 application topically 2 (two) times daily. 50 g 2  . valACYclovir (VALTREX) 500 MG tablet Take 1 tablet (500 mg total) by mouth 2 (two) times daily. 180 tablet 1  . ciprofloxacin (CIPRO) 500 MG tablet Take 1 tablet (500 mg total) by mouth 2 (two) times daily. (Patient not taking: Reported on 04/03/2016) 14 tablet 0   No current facility-administered medications for this visit.     Review of Systems:  GENERAL:  Feels "ok".  No fevers or sweats.  Weight up 8 pounds. PERFORMANCE STATUS (ECOG):  1 HEENT:  Decreased right sided hearing.  No visual changes, runny nose, sore throat, mouth sores or tenderness. Lungs: No shortness of breath or cough.  No  hemoptysis. Cardiac:  No chest pain, palpitations, orthopnea, or PND. GI:  No nausea, vomiting, diarrhea, constipation, melena or hematochezia. GU:  No urgency, frequency, dysuria, or hematuria.  Musculoskeletal: No back pain.  No joint pain.  No muscle tenderness. Extremities:  No pain or swelling. Skin:  No rashes or skin changes. Neuro:  Herpetic neuralgia.  No headache, numbness or weakness, balance or coordination issues. Endocrine:  No diabetes, thyroid issues, hot flashes or night sweats. Psych:  No mood changes, depression or anxiety. Pain:  No focal pain. Review of  systems:  All other systems reviewed and found to be negative.  Physical Exam: Blood pressure 126/81, pulse 99, temperature 97.6 F (36.4 C), temperature source Tympanic, resp. rate 18, weight 214 lb 4.6 oz (97.2 kg). GENERAL:  Well developed, well nourished, woman sitting comfortably in the exam room in no acute distress.  She oftens holds the right side of her face and head. MENTAL STATUS:  Alert and oriented to person, place and time. HEAD:  Wearing a black cap.  Short graying hair.  Thick crust/eschar on scalp with some small areas of alopecia.  No vesicles.  Face symmetric.  No Cushingoid features. EYES:  Glasses.  Blue eyes.  Pupils equal round and reactive to light and accomodation.  No conjunctivitis or scleral icterus. ENT:  Speech slightly affected.  Tongue deviates to the right secondary to atrophy.  Oropharynx clear without lesions. Mucous membranes moist.   RESPIRATORY:  Clear to auscultation without rales, wheezes or rhonchi. CARDIOVASCULAR:  Regular rate and rhythm without murmur, rub or gallop. BREAST:  Right breast with well healed incision at 2 o'clock position.  Radiation changes.  No masses, skin changes or nipple discharge.  Left breast without masses, skin changes or nipple discharge.  ABDOMEN:  Soft, non-tender, with active bowel sounds, and no hepatosplenomegaly.  No masses. SKIN: Scalp with large area of thick crust.  No vesicles. EXTREMITIES: No edema, no skin discoloration or tenderness.  No palpable cords. LYMPH NODES:  No palpable cervical, supraclavicular, axillary or inguinal adenopathy  NEUROLOGICAL: Unremarkable. PSYCH:  Appropriate.   No visits with results within 3 Day(s) from this visit.  Latest known visit with results is:  Office Visit on 03/17/2016  Component Date Value Ref Range Status  . Color, UA 03/17/2016 straw   Final  . Clarity, UA 03/17/2016 cloudy   Final  . Glucose, UA 03/17/2016 neg   Final  . Bilirubin, UA 03/17/2016 neg   Final  .  Ketones, UA 03/17/2016 neg   Final  . Spec Grav, UA 03/17/2016 <=1.005   Final  . Blood, UA 03/17/2016 large   Final  . pH, UA 03/17/2016 6.5   Final  . Protein, UA 03/17/2016 100   Final  . Urobilinogen, UA 03/17/2016 0.2   Final  . Nitrite, UA 03/17/2016 neg   Final  . Leukocytes, UA 03/17/2016 large (3+)* Negative Final  . Organism ID, Bacteria 03/17/2016    Final                   Value:Multiple organisms present,each less than 10,000 CFU/mL. These organisms,commonly found on external and internal genitalia,are considered colonizers. No further testing performed.    [facility] Labs: Labs on 06/08/2015 revealed a hematocrit of 35.1, hemoglobin 11.7, MCV 81, platelets 225,000, WBC 17,200 with an ANC of 3100.  Absolutle lymphocyte count (ALC) was 12,200.  Uric acid was 8.0 (elevated; last check 7.7- elevated).  Creatinine was 0.90.  LDH was 217.  Labs on 12/05/2015 revealed a hematocrit of 34.1, hemoglobin 10.9, MCV 81, platelets 213,000, WBC 17,700 with an ANC of 3100.  Absolute lymphocyte count (ALC) was 13,600.  Uric acid was 6.0.  Creatinine was 0.81.  LDH was 212.  Labs on 12/05/2015 revealed a hematocrit of 36.0, hemoglobin 11.5, MCV 82, platelets 213,000, WBC 8,100 with an ANC of 3300.  Absolute lymphocyte count (ALC) was 3,900.  Creatinine was 0.84.  Ferritin was 79.  Iron saturation was 12%.  Reticulocyte count was 1.1%.  Coombs was negative.   Assessment:  JASIEL FLORENCIO is a 66 y.o. female with stage I chronic lymphocytic leukemia diagnosed in 2011.  She was diagnosed with right breast DCIS in 02/2016.  CLL:  She presented with mild lymphocytosis and small cervical adenopathy on routine physical exam.  CBC revealed only lymphocytosis.  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  SPEP was normal.  Chest, abdomen, and pelvic CT scan revealed  small diffuse nodes.  DCIS:  She underwent wire-guided right partial mastectomy on 03/11/2016 at [facility].  Pathology revealed a 3.9 cm grade III DCIS.  ER was strongly positive (Allred score 8) in 100% of cells.  PR was strongly positive (Allred score 6) in 30% of cells.  Pathologic stage was TisNx.  She will complete breast radiation on 06/26/2016.  She has a history of intermittent rectal and vaginal bleeding in 2015.  She was seen by gynecology at the [facility].  Pelvic exam and ultrasound revealed fibroids.   She has recurrent vaginal bleeding.  EGD on 11/11/2011 revealed a hiatal hernia and gastritis.  Colonoscopy on 11/11/2011 revealed diverticulosis in the ascending, descending, and sigmoid colon.  Stool guaiacs x 3 were negative in 06/2014.  She denies any melena or hematochezia.   She has a history of varicella zoster involving the left side of her scalp.  She was admitted to [facility] from 12/21/2015 - 12/25/2015 with disseminated varicella zoster.  She was treated with IV acyclovir then switched to oral valacyclovir (completed 01/10/2016).  She is on prophylactic valacyclovir.  Bone density study on 05/21/2016 was normal with a T score of -0.3 in the AP spine L1-L4 and -0.3 in the left femoral neck.  Symptomatically, she has chronic post herpetic neuralgia pain.  She has decreased right sided hearing.   Exam reveals a thick crusting of the scalp in the area of prior varicella zoster.  Plan: 1.  Discuss ongoing radiation for DCIS.  As tumor is hormone receptor positive, discuss plan for 5 years of hormonal therapy (tamoxifen versus aromatase inhibitor) after completion of radiation.  Discuss side effects of tamoxifen and aromatase inhibitors.  Discuss bone density study (normal).  Discuss initiation of Arimidex. 2.  Rx:  Arimidex 1 mg a day (begin after radiation). 3.  [facility] slip:  CBC with diff, CMP.  4.  Follow-up wound care for scalp lesion at [facility]. 5.  RTC in 5 weeks for MD assessment  and review of [facility] labs.   Lequita Asal, MD  06/20/2016, 4:10 PM

## 2016-06-23 ENCOUNTER — Ambulatory Visit: Payer: 59

## 2016-06-23 DIAGNOSIS — S0100XD Unspecified open wound of scalp, subsequent encounter: Secondary | ICD-10-CM | POA: Diagnosis not present

## 2016-06-23 DIAGNOSIS — B027 Disseminated zoster: Secondary | ICD-10-CM | POA: Diagnosis not present

## 2016-06-23 DIAGNOSIS — B0229 Other postherpetic nervous system involvement: Secondary | ICD-10-CM | POA: Diagnosis not present

## 2016-06-23 DIAGNOSIS — S0100XA Unspecified open wound of scalp, initial encounter: Secondary | ICD-10-CM | POA: Insufficient documentation

## 2016-06-24 ENCOUNTER — Ambulatory Visit: Payer: 59

## 2016-06-24 ENCOUNTER — Ambulatory Visit
Admission: RE | Admit: 2016-06-24 | Discharge: 2016-06-24 | Disposition: A | Discharge status: Home / Self Care | Payer: 59 | Source: Ambulatory Visit | Attending: Radiation Oncology | Admitting: Radiation Oncology

## 2016-06-24 DIAGNOSIS — Z510 Encounter for antineoplastic radiation therapy: Secondary | ICD-10-CM | POA: Diagnosis not present

## 2016-06-24 DIAGNOSIS — M129 Arthropathy, unspecified: Secondary | ICD-10-CM | POA: Diagnosis not present

## 2016-06-24 DIAGNOSIS — D0511 Intraductal carcinoma in situ of right breast: Secondary | ICD-10-CM | POA: Diagnosis not present

## 2016-06-24 DIAGNOSIS — K219 Gastro-esophageal reflux disease without esophagitis: Secondary | ICD-10-CM | POA: Diagnosis not present

## 2016-06-24 DIAGNOSIS — C9110 Chronic lymphocytic leukemia of B-cell type not having achieved remission: Secondary | ICD-10-CM | POA: Diagnosis not present

## 2016-06-24 DIAGNOSIS — Z79899 Other long term (current) drug therapy: Secondary | ICD-10-CM | POA: Diagnosis not present

## 2016-06-25 ENCOUNTER — Ambulatory Visit
Admission: RE | Admit: 2016-06-25 | Discharge: 2016-06-25 | Disposition: A | Discharge status: Home / Self Care | Payer: 59 | Source: Ambulatory Visit | Attending: Radiation Oncology | Admitting: Radiation Oncology

## 2016-06-25 DIAGNOSIS — Z510 Encounter for antineoplastic radiation therapy: Secondary | ICD-10-CM | POA: Diagnosis not present

## 2016-06-25 DIAGNOSIS — M129 Arthropathy, unspecified: Secondary | ICD-10-CM | POA: Diagnosis not present

## 2016-06-25 DIAGNOSIS — Z79899 Other long term (current) drug therapy: Secondary | ICD-10-CM | POA: Diagnosis not present

## 2016-06-25 DIAGNOSIS — K219 Gastro-esophageal reflux disease without esophagitis: Secondary | ICD-10-CM | POA: Diagnosis not present

## 2016-06-25 DIAGNOSIS — D0511 Intraductal carcinoma in situ of right breast: Secondary | ICD-10-CM | POA: Diagnosis not present

## 2016-06-25 DIAGNOSIS — C9110 Chronic lymphocytic leukemia of B-cell type not having achieved remission: Secondary | ICD-10-CM | POA: Diagnosis not present

## 2016-07-07 DIAGNOSIS — S0100XA Unspecified open wound of scalp, initial encounter: Secondary | ICD-10-CM | POA: Diagnosis not present

## 2016-07-07 DIAGNOSIS — B027 Disseminated zoster: Secondary | ICD-10-CM | POA: Diagnosis not present

## 2016-07-18 ENCOUNTER — Encounter: Payer: Self-pay | Admitting: Hematology and Oncology

## 2016-07-18 DIAGNOSIS — D0590 Unspecified type of carcinoma in situ of unspecified breast: Secondary | ICD-10-CM | POA: Diagnosis not present

## 2016-07-18 DIAGNOSIS — C9110 Chronic lymphocytic leukemia of B-cell type not having achieved remission: Secondary | ICD-10-CM | POA: Diagnosis not present

## 2016-07-22 DIAGNOSIS — L98499 Non-pressure chronic ulcer of skin of other sites with unspecified severity: Secondary | ICD-10-CM | POA: Diagnosis not present

## 2016-07-22 DIAGNOSIS — B027 Disseminated zoster: Secondary | ICD-10-CM | POA: Diagnosis not present

## 2016-07-24 ENCOUNTER — Inpatient Hospital Stay: Payer: Medicare Other | Attending: Hematology and Oncology | Admitting: Hematology and Oncology

## 2016-07-24 ENCOUNTER — Ambulatory Visit
Admission: RE | Admit: 2016-07-24 | Discharge: 2016-07-24 | Disposition: A | Discharge status: Home / Self Care | Payer: Medicare Other | Source: Ambulatory Visit | Attending: Radiation Oncology | Admitting: Radiation Oncology

## 2016-07-24 ENCOUNTER — Encounter: Payer: Self-pay | Admitting: Hematology and Oncology

## 2016-07-24 ENCOUNTER — Encounter: Payer: Self-pay | Admitting: Radiation Oncology

## 2016-07-24 VITALS — BP 124/81 | HR 78 | Temp 98.1°F | Resp 18 | Wt 219.1 lb

## 2016-07-24 VITALS — BP 125/87 | Temp 98.7°F | Wt 219.1 lb

## 2016-07-24 DIAGNOSIS — Z9011 Acquired absence of right breast and nipple: Secondary | ICD-10-CM | POA: Diagnosis not present

## 2016-07-24 DIAGNOSIS — Z170 Estrogen receptor positive status [ER+]: Secondary | ICD-10-CM | POA: Diagnosis not present

## 2016-07-24 DIAGNOSIS — Z923 Personal history of irradiation: Secondary | ICD-10-CM | POA: Insufficient documentation

## 2016-07-24 DIAGNOSIS — Z8719 Personal history of other diseases of the digestive system: Secondary | ICD-10-CM | POA: Diagnosis not present

## 2016-07-24 DIAGNOSIS — Z9049 Acquired absence of other specified parts of digestive tract: Secondary | ICD-10-CM | POA: Diagnosis not present

## 2016-07-24 DIAGNOSIS — Z803 Family history of malignant neoplasm of breast: Secondary | ICD-10-CM | POA: Diagnosis not present

## 2016-07-24 DIAGNOSIS — B0229 Other postherpetic nervous system involvement: Secondary | ICD-10-CM | POA: Diagnosis not present

## 2016-07-24 DIAGNOSIS — Z79811 Long term (current) use of aromatase inhibitors: Secondary | ICD-10-CM | POA: Diagnosis not present

## 2016-07-24 DIAGNOSIS — C9110 Chronic lymphocytic leukemia of B-cell type not having achieved remission: Secondary | ICD-10-CM | POA: Insufficient documentation

## 2016-07-24 DIAGNOSIS — K219 Gastro-esophageal reflux disease without esophagitis: Secondary | ICD-10-CM | POA: Diagnosis not present

## 2016-07-24 DIAGNOSIS — D259 Leiomyoma of uterus, unspecified: Secondary | ICD-10-CM | POA: Diagnosis not present

## 2016-07-24 DIAGNOSIS — Z801 Family history of malignant neoplasm of trachea, bronchus and lung: Secondary | ICD-10-CM | POA: Diagnosis not present

## 2016-07-24 DIAGNOSIS — D0511 Intraductal carcinoma in situ of right breast: Secondary | ICD-10-CM | POA: Insufficient documentation

## 2016-07-24 DIAGNOSIS — Z79899 Other long term (current) drug therapy: Secondary | ICD-10-CM | POA: Insufficient documentation

## 2016-07-24 MED ORDER — ANASTROZOLE 1 MG PO TABS: 1.0000 mg | ORAL_TABLET | Freq: Every day | ORAL | 3 refills | Status: DC

## 2016-07-24 NOTE — Progress Notes (Signed)
[facility] day:  07/24/16  Chief Complaint: Joanna Phillips is a 66 y.o. female with chronic lymphocytic leukemia (CLL) and right breast DCIS who is seen for 5 week assessment.  HPI:  The patient was last seen in the medical oncology clinic on 06/20/2016.  At that time, she had chronic post herpetic neuralgia pain on her right scalp and right side of face.  She went to the wound clinic at [facility] on 06/23/2016.  She had decreased right sided hearing secondary to shingles in the ear.  Exam had revealed a well healing lumpectomy incision.  Radiation to her right breast was scheduled to complete 06/25/2016.  A prescription for Arimidex was provided to begin after radiation completed.  A baseline bone density study was ordered.  Bone density study on 05/21/2016 was normal with a T score of -0.3 in the AP spine L1-L4 and -0.3 in the left femoral neck.  Radiation completed on 06/25/2016.  She had some blistering requiring radiation to be on hold x 3 days.  She denies any breast concerns.    She started on Arimidex on 06/26/2016.  She is tolerating treatment well.  She has no bone pain or night sweats.  She has no complaints other than post-nasal drip, and 5 out of 10 post herpetic neuralgia pain, controlled with gabapentin 800mg  three times daily.  She is followed by neurology.   Past Medical History:  Diagnosis Date  . Arthritis   . CLL (chronic lymphocytic leukemia) ([place]) 2011  . DCIS (ductal carcinoma in situ) 03/11/2016  . GERD (gastroesophageal reflux disease)     Past Surgical History:  Procedure Laterality Date  . BREAST BIOPSY Right 02/12/2016   path pending  . CHOLECYSTECTOMY  1996  . COLONOSCOPY  2013    Family History  Problem Relation Age of Onset  . Cancer Father     lung  . Diabetes Mother   . Breast cancer Maternal Aunt     Social History:  reports that she has never smoked. She has never used smokeless tobacco. She reports  that she does not drink alcohol or use drugs.  She lives in [place].  The patient is accompanied by her husband, [name], today.  Allergies:  Allergies  Allergen Reactions  . Amoxicillin Hives  . Hydrocodone Itching  . Sulfamethoxazole-Trimethoprim Nausea Only  . Allopurinol Rash    Current Medications: Current Outpatient Prescriptions  Medication Sig Dispense Refill  . amitriptyline (ELAVIL) 50 MG tablet Take 1 tablet (50 mg total) by mouth at bedtime. 30 tablet 11  . anastrozole (ARIMIDEX) 1 MG tablet Take 1 tablet (1 mg total) by mouth daily. 90 tablet 3  . fluticasone (FLONASE) 50 MCG/ACT nasal spray Place 2 sprays into both nostrils daily. 16 g 6  . gabapentin (NEURONTIN) 300 MG capsule Take 2 capsules (600 mg total) by mouth 3 (three) times daily. 180 capsule 5  . loratadine (CLARITIN) 10 MG tablet Take 1 tablet (10 mg total) by mouth daily. 30 tablet 11  . omeprazole (PRILOSEC) 20 MG capsule Take 1 capsule by mouth two times daily 180 capsule 3  . silver sulfADIAZINE (SILVADENE) 1 % cream Apply 1 application topically 2 (two) times daily. 50 g 2  . valACYclovir (VALTREX) 500 MG tablet Take 1 tablet (500 mg total) by mouth 2 (two) times daily. 180 tablet 1  . ciprofloxacin (CIPRO) 500 MG tablet Take 1 tablet (500 mg total) by mouth 2 (two) times daily. (Patient  not taking: Reported on 04/03/2016) 14 tablet 0   No current facility-administered medications for this visit.     Review of Systems:  GENERAL:  Feels "good".  No fevers or sweats.  Weight up 5 pounds. PERFORMANCE STATUS (ECOG):  1 HEENT:  Right residual ear pain.  No loss of hearing. No visual changes, runny nose, sore throat, mouth sores or tenderness. Lungs: No shortness of breath or cough.  No hemoptysis. Cardiac: paroxysmal nocturnal dyspnea. No chest pain, palpitations, or orthopnea. GI:  No nausea, vomiting, diarrhea, constipation, melena or hematochezia. GU:  No urgency, frequency, dysuria, or hematuria.   Musculoskeletal: No back pain.  No joint pain.  No muscle tenderness. Extremities:  No pain or swelling. Skin:  Scalp s/p debridement at wound care center at [facility].  No rashes or skin changes. Neuro:  Herpetic neuralgia.  No headache, numbness or weakness, balance or coordination issues. Endocrine:  No diabetes, thyroid issues, hot flashes or night sweats. Psych:  No mood changes, depression or anxiety. Pain:  No focal pain. Review of systems:  All other systems reviewed and found to be negative.  Physical Exam: Blood pressure 124/81, pulse 78, temperature 98.1 F (36.7 C), temperature source Tympanic, resp. rate 18, weight 219 lb 2.2 oz (99.4 kg). GENERAL:  Well developed, well nourished, woman sitting comfortably in the exam room in no acute distress.  She oftens holds the right side of her face and head. MENTAL STATUS:  Alert and oriented to person, place and time. HEAD:  Wearing a black cap.  Short graying hair. 3 cm area of alopecia with new skin, covered with xeroform, abdomen pad, and cap.  No vesicles.  Face symmetric.  No Cushingoid features. EYES:  Glasses.  Blue eyes.  Pupils equal round and reactive to light and accomodation.  No conjunctivitis or scleral icterus. ENT:  Speech slightly affected.  Tongue deviates to the right secondary to atrophy.  Oropharynx clear without lesions. Mucous membranes moist.   RESPIRATORY:  Clear to auscultation without rales, wheezes or rhonchi. CARDIOVASCULAR:  Regular rate and rhythm without murmur, rub or gallop. ABDOMEN:  Soft, non-tender, with active bowel sounds, and no hepatosplenomegaly.  No masses. SKIN: Scalp with large area of thick crust.  No vesicles. EXTREMITIES: No edema, no skin discoloration or tenderness.  No palpable cords. LYMPH NODES:  No palpable cervical, supraclavicular, axillary or inguinal adenopathy  NEUROLOGICAL: Unremarkable. PSYCH:  Appropriate.   No visits with results within 3 Day(s) from this visit.  Latest known  visit with results is:  Office Visit on 03/17/2016  Component Date Value Ref Range Status  . Color, UA 03/17/2016 straw   Final  . Clarity, UA 03/17/2016 cloudy   Final  . Glucose, UA 03/17/2016 neg   Final  . Bilirubin, UA 03/17/2016 neg   Final  . Ketones, UA 03/17/2016 neg   Final  . Spec Grav, UA 03/17/2016 <=1.005   Final  . Blood, UA 03/17/2016 large   Final  . pH, UA 03/17/2016 6.5   Final  . Protein, UA 03/17/2016 100   Final  . Urobilinogen, UA 03/17/2016 0.2   Final  . Nitrite, UA 03/17/2016 neg   Final  . Leukocytes, UA 03/17/2016 large (3+)* Negative Final  . Organism ID, Bacteria 03/17/2016    Final                   Value:Multiple organisms present,each less than 10,000 CFU/mL. These organisms,commonly found on external and internal genitalia,are considered colonizers.  No further testing performed.    [facility] Labs: Labs on 06/08/2015 revealed a hematocrit of 35.1, hemoglobin 11.7, MCV 81, platelets 225,000, WBC 17,200 with an ANC of 3100.  Absolutle lymphocyte count (ALC) was 12,200.  Uric acid was 8.0 (elevated; last check 7.7- elevated).  Creatinine was 0.90.  LDH was 217.  Labs on 12/05/2015 revealed a hematocrit of 34.1, hemoglobin 10.9, MCV 81, platelets 213,000, WBC 17,700 with an ANC of 3100.  Absolute lymphocyte count (ALC) was 13,600.  Uric acid was 6.0.  Creatinine was 0.81.  LDH was 212.  Labs on 05/14/2016 revealed a hematocrit of 34.0, hemoglobin 11.1, MCV 83, platelets 382,000, WBC 13,800 with an ANC of 4600.  Absolute lymphocyte count (ALC) was 7700.    Labs on 06/11/2016 revealed a hematocrit of 36.0, hemoglobin 11.5, MCV 82, platelets 213,000, WBC/ 8,100 with an ANC of 3300.  Absolute lymphocyte count (ALC) was 3,900.  Creatinine was 0.84.  Ferritin was 79.  Iron saturation was 12%.  Reticulocyte count was 1.1%.  Coombs was negative.  Labs on 07/18/2016 revealed a hematocrit of 35.6, hemoglobin 11.8, MCV 83, platelets 200,000, WBC/ 6,900 with an ANC of  2600.  Absolute lymphocyte count (ALC) was 3600.  Creatinine was 0.78.  LFTs were normal.   Assessment:  BENNA ARNO is a 66 y.o. female with stage I chronic lymphocytic leukemia diagnosed in 2011.  She was diagnosed with right breast DCIS in 02/2016.  CLL:  She presented with mild lymphocytosis and small cervical adenopathy on routine physical exam.  CBC revealed only lymphocytosis.  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  SPEP was normal.  Chest, abdomen, and pelvic CT scan revealed small diffuse nodes.  DCIS:  She underwent wire-guided right partial mastectomy on 03/11/2016 at [facility].  Pathology revealed a 3.9 cm grade III DCIS.  ER was strongly positive (Allred score 8) in 100% of cells.  PR was strongly positive (Allred score 6) in 30% of cells.  Pathologic stage was TisNx.  She completed breast radiation on 06/25/2016.    She began Arimidex on 06/26/2016.  She is tolerating treatment well.   She has a history of intermittent rectal and vaginal bleeding in 2015.  She was seen by gynecology at the [facility].  Pelvic exam and ultrasound revealed fibroids.   She has recurrent vaginal bleeding.  EGD on 11/11/2011 revealed a hiatal hernia and gastritis.  Colonoscopy on 11/11/2011 revealed diverticulosis in the ascending, descending, and sigmoid colon.  Stool guaiacs x 3 were negative in 06/2014.  She denies any melena or hematochezia.   She has a history of varicella zoster involving the right side of her scalp.  She was admitted to [facility] from 12/21/2015 - 12/25/2015 with disseminated varicella zoster.  She was treated with IV acyclovir then switched to oral valacyclovir (completed 01/10/2016).  She is on prophylactic valacyclovir.  Scalp has undergone debridement at the wound care center.  Bone density study on 05/21/2016 was normal with a T score  of -0.3 in the AP spine L1-L4 and -0.3 in the left femoral neck.  Symptomatically, she denies any B symptoms.  She has chronic post herpetic neuralgia pain.  She reports 5/10 pain on scalp, right face, and right ear.  Exam reveals a healing area of her scalp s/p debridement at the wound care center.  Plan: 1.  Review labs  from 07/18/2016. 2.  Continue Arimidex.  3.  Rx:  Arimidex 1 mg a day refill (90 tablets, 3 refills) 4.  Follow-up wound care for scalp lesion at [facility]. 5.  Discuss plan for follow-up in 6 months after next appointment. 6.  RTC in 3 months for MD assessment, breast exam, and review of [facility] labs.   I saw and evaluated the patient, participating in the key portions of the service and reviewing pertinent diagnostic studies and records.  I reviewed the nurse practitioner's note and agree with the findings and the plan.  The assessment and plan were discussed with the patient.  A few questions were asked by the patient and answered.    Lucendia Herrlich, NP   Lequita Asal, MD  07/24/2016, 4:22 PM

## 2016-07-24 NOTE — Progress Notes (Signed)
Radiation Oncology Follow up Note  Name: Joanna Phillips   Date:   07/24/2016 MRN:  [id number] DOB: 10/21/1950    This 66 y.o. female presents to the clinic today for one-month follow-up status post whole breast radiation to her right breast for ductal carcinoma in situ ER/PR positive.  REFERRING PROVIDER: Arlis Porta., MD  HPI: Patient is a 66 year old female now out 1 month having completed right whole breast radiation for ductal carcinoma in situ ER/PR positive status post wide local excision.. She seen today in routine follow-up is doing well specifically denies breast tenderness cough or bone pain. Currently on Arimidex following that well without side effect. She continues follow-up care for zoster of the scalp with lesions healing slowly.  COMPLICATIONS OF TREATMENT: none  FOLLOW UP COMPLIANCE: keeps appointments   PHYSICAL EXAM:  BP 125/87   Temp 98.7 F (37.1 C)   Wt 219 lb 2.2 oz (99.4 kg)   BMI 36.47 kg/m  Lungs are clear to A&P cardiac examination essentially unremarkable with regular rate and rhythm. No dominant mass or nodularity is noted in either breast in 2 positions examined. Incision is well-healed. No axillary or supraclavicular adenopathy is appreciated. Cosmetic result is excellent. Well-developed well-nourished patient in NAD. HEENT reveals PERLA, EOMI, discs not visualized.  Oral cavity is clear. No oral mucosal lesions are identified. Neck is clear without evidence of cervical or supraclavicular adenopathy. Lungs are clear to A&P. Cardiac examination is essentially unremarkable with regular rate and rhythm without murmur rub or thrill. Abdomen is benign with no organomegaly or masses noted. Motor sensory and DTR levels are equal and symmetric in the upper and lower extremities. Cranial nerves II through XII are grossly intact. Proprioception is intact. No peripheral adenopathy or edema is identified. No motor or sensory levels are noted. Crude visual fields are  within normal range.  RADIOLOGY RESULTS: No current films for review  PLAN: Present time patient is doing well recovering nicely from her radiation therapy treatments. I'm please were overall progress. She continues on aromatase without side effect. I have asked to see her back in 4-5 months for follow-up. Patient is to call sooner with any concerns.  I would like to take this opportunity to thank you for allowing me to participate in the care of your patient.Armstead Peaks., MD

## 2016-07-24 NOTE — Progress Notes (Signed)
Patient states she has done well on the Arimidex over the past month.

## 2016-07-25 ENCOUNTER — Ambulatory Visit: Payer: Medicare Other | Admitting: Hematology and Oncology

## 2016-08-04 DIAGNOSIS — S0100XD Unspecified open wound of scalp, subsequent encounter: Secondary | ICD-10-CM | POA: Diagnosis not present

## 2016-08-04 DIAGNOSIS — B027 Disseminated zoster: Secondary | ICD-10-CM | POA: Diagnosis not present

## 2016-09-09 ENCOUNTER — Encounter: Payer: Self-pay | Admitting: *Deleted

## 2016-09-11 ENCOUNTER — Encounter: Payer: Self-pay | Admitting: General Surgery

## 2016-09-11 ENCOUNTER — Ambulatory Visit (INDEPENDENT_AMBULATORY_CARE_PROVIDER_SITE_OTHER): Payer: Medicare Other | Admitting: General Surgery

## 2016-09-11 VITALS — BP 130/68 | HR 74 | Resp 16 | Ht 65.0 in | Wt 224.0 lb

## 2016-09-11 DIAGNOSIS — I872 Venous insufficiency (chronic) (peripheral): Secondary | ICD-10-CM | POA: Diagnosis not present

## 2016-09-11 NOTE — Progress Notes (Signed)
Patient ID: Joanna Phillips, female   DOB: [birth year], 66 y.o.   MRN: [id number]  No chief complaint on file.   HPI Joanna Phillips is a 66 y.o. female Here today for follow up venous insufficiency. The patient states no new problems at this time. She wears her compression hose on a daily basis.  In august was diagnosed with DCIS of right breast, underwent lumpectomy at [facility] and radiation with Dr. Baruch Gouty. Currently on Arimidex. HPI  Past Medical History:  Diagnosis Date  . Arthritis   . CLL (chronic lymphocytic leukemia) ([place]) 2011  . DCIS (ductal carcinoma in situ) 03/11/2016  . GERD (gastroesophageal reflux disease)     Past Surgical History:  Procedure Laterality Date  . BREAST BIOPSY Right 02/12/2016   path pending  . CHOLECYSTECTOMY  1996  . COLONOSCOPY  2013    Family History  Problem Relation Age of Onset  . Cancer Father     lung  . Diabetes Mother   . Breast cancer Maternal Aunt     Social History Social History  Substance Use Topics  . Smoking status: Never Smoker  . Smokeless tobacco: Never Used  . Alcohol use No    Allergies  Allergen Reactions  . Amoxicillin Hives  . Hydrocodone Itching  . Sulfamethoxazole-Trimethoprim Nausea Only  . Allopurinol Rash    Current Outpatient Prescriptions  Medication Sig Dispense Refill  . amitriptyline (ELAVIL) 50 MG tablet Take 1 tablet (50 mg total) by mouth at bedtime. 30 tablet 11  . anastrozole (ARIMIDEX) 1 MG tablet Take 1 tablet (1 mg total) by mouth daily. 90 tablet 3  . fluticasone (FLONASE) 50 MCG/ACT nasal spray Place 2 sprays into both nostrils daily. 16 g 6  . gabapentin (NEURONTIN) 300 MG capsule Take 2 capsules (600 mg total) by mouth 3 (three) times daily. 180 capsule 5  . loratadine (CLARITIN) 10 MG tablet Take 1 tablet (10 mg total) by mouth daily. 30 tablet 11  . omeprazole (PRILOSEC) 20 MG capsule Take 1 capsule by mouth two times daily 180 capsule 3  . valACYclovir (VALTREX) 500 MG tablet Take 1  tablet (500 mg total) by mouth 2 (two) times daily. 180 tablet 1  . silver sulfADIAZINE (SILVADENE) 1 % cream Apply 1 application topically 2 (two) times daily. (Patient not taking: Reported on 09/11/2016) 50 g 2   No current facility-administered medications for this visit.     Review of Systems Review of Systems  Constitutional: Negative.   Respiratory: Negative.   Cardiovascular: Negative.     Blood pressure 130/68, pulse 74, resp. rate 16, height 5\' 5"  (1.651 m), weight 224 lb (101.6 kg).  Physical Exam Physical Exam  Constitutional: She is oriented to person, place, and time. She appears well-developed and well-nourished.  Cardiovascular: Intact distal pulses.   Pulses:      Dorsalis pedis pulses are 1+ on the right side, and 1+ on the left side.       Posterior tibial pulses are 2+ on the right side, and 2+ on the left side.  Neurological: She is alert and oriented to person, place, and time.  Skin: Skin is warm and dry.  Mild venous stasis skin changes on medial lower legs bilaterally.  Mild swelling of left leg. No tenderness. Telangiectases scattered around lower legs, bilaterally A few small medial varicose veins visible, bilaterally     Data Reviewed Notes from 2016 reviewed  Assessment    Venous insuffiencey with no new problems. Significant improvement  with swelling and skin changes since last visit in 2016.  DCIS of right breast, underwent lumpectomy at [facility] and radiation with Dr. Baruch Gouty. Currently on Arimidex.      Plan    Patient to return as needed. Continue to wear compassion hose daily.  HPI, Physical Exam, Assessment and Plan have been scribed under the direction and in the presence of Mckinley Jewel, MD  Gaspar Cola, CMA    I have completed the exam and reviewed the above documentation for accuracy and completeness.  I agree with the above.  Haematologist has been used and any errors in dictation or transcription are  unintentional.  Dub Maclellan G. Jamal Collin, M.D., F.A.C.S.  Junie Panning G 09/11/2016, 4:53 PM

## 2016-09-11 NOTE — Patient Instructions (Addendum)
Patient to return as needed. Continue to wear compassion hoes daily.

## 2016-09-23 ENCOUNTER — Other Ambulatory Visit: Payer: Self-pay

## 2016-09-23 DIAGNOSIS — K219 Gastro-esophageal reflux disease without esophagitis: Secondary | ICD-10-CM

## 2016-09-23 MED ORDER — OMEPRAZOLE 20 MG PO CPDR: DELAYED_RELEASE_CAPSULE | ORAL | 3 refills | Status: DC

## 2016-09-23 NOTE — Telephone Encounter (Signed)
Last ov  03/17/16 Last filled 02/29/16

## 2016-09-26 ENCOUNTER — Encounter: Payer: Self-pay | Admitting: Nurse Practitioner

## 2016-09-26 ENCOUNTER — Ambulatory Visit (INDEPENDENT_AMBULATORY_CARE_PROVIDER_SITE_OTHER): Payer: Medicare Other | Admitting: Nurse Practitioner

## 2016-09-26 VITALS — BP 115/56 | HR 91 | Temp 98.3°F | Resp 16 | Wt 223.0 lb

## 2016-09-26 DIAGNOSIS — B0229 Other postherpetic nervous system involvement: Secondary | ICD-10-CM | POA: Diagnosis not present

## 2016-09-26 DIAGNOSIS — B027 Disseminated zoster: Secondary | ICD-10-CM

## 2016-09-26 DIAGNOSIS — Z8673 Personal history of transient ischemic attack (TIA), and cerebral infarction without residual deficits: Secondary | ICD-10-CM

## 2016-09-26 DIAGNOSIS — L270 Generalized skin eruption due to drugs and medicaments taken internally: Secondary | ICD-10-CM

## 2016-09-26 DIAGNOSIS — R6883 Chills (without fever): Secondary | ICD-10-CM | POA: Diagnosis not present

## 2016-09-26 MED ORDER — PREGABALIN 150 MG PO CAPS: 150.0000 mg | ORAL_CAPSULE | Freq: Two times a day (BID) | ORAL | 2 refills | Status: DC

## 2016-09-26 MED ORDER — VALACYCLOVIR HCL 500 MG PO TABS: 500.0000 mg | ORAL_TABLET | Freq: Two times a day (BID) | ORAL | 1 refills | Status: DC

## 2016-09-26 MED ORDER — LORATADINE 10 MG PO TABS: 10.0000 mg | ORAL_TABLET | Freq: Every day | ORAL | 11 refills | Status: DC

## 2016-09-26 NOTE — Patient Instructions (Addendum)
Joanna Phillips, Thank you for coming in to clinic today.  1. For your shingles: - Try Lyrica for your postherpetic neuralgia (nerve pain).   - Take 150 mg tablet one tablet twice per day.   - Once you get your Lyrica, STOP your gabapentin. - If your pain gets worse, return to your gabapentin and STOP your Lyrica. - I will share the picture from today with your wound care clinic for additional recommendations.  2. For your chills and arm tingling/weakness: - You don't have any signs of active infection. - You have had past TIA and you also have a carotid bruit.  Let's do a check of your carotid arteries with an Ultrasound. - We will also check your CBC with differential again.  You may not need another one for your next check for your oncologist.  Check with them.  Please schedule a follow-up appointment with Cassell Smiles, AGNP to Return 1-2 weeks as needed if symptoms worsen or fail to improve.  If you have any other questions or concerns, please feel free to call the clinic or send a message through [place]. You may also schedule an earlier appointment if necessary.  Cassell Smiles, DNP, AGNP-BC Adult Gerontology Nurse Practitioner [facility], [facility]   Postherpetic Neuralgia Postherpetic neuralgia (PHN) is nerve pain that occurs after a shingles infection. Shingles is a painful rash that appears on one side of the body, usually on your trunk or face. Shingles is caused by the varicella-zoster virus. This is the same virus that causes chickenpox. In people who have had chickenpox, the virus can resurface years later and cause shingles. You may have PHN if you continue to have pain for 3 months after your shingles rash has gone away. PHN appears in the same area where you had the shingles rash. For most people, PHN goes away within 1 year. Getting a vaccination for shingles can prevent PHN. This vaccine is recommended for people older than 50. It may prevent shingles and may also  lower your risk of PHN if you do get shingles. What are the causes? PHN is caused by damage to your nerves from the varicella-zoster virus. This damage makes your nerves overly sensitive. What increases the risk? Aging is the biggest risk factor for developing PHN. Most people who get PHN are older than 44. Other risk factors include:  Having very bad pain before your shingles rash starts.  Having a very bad rash.  Having shingles in the nerve that supplies your face and eye (trigeminal nerve). What are the signs or symptoms? Pain is the main symptom of PHN. The pain is often very bad and may be described as stabbing, burning, or feeling like an electric shock. The pain may come and go or may be there all the time. Pain may be triggered by light touches on the skin or changes in temperature. You may have itching along with the pain. How is this diagnosed? Your health care provider may diagnose PHN based on your symptoms and your history of shingles. Lab studies and other diagnostic tests are usually not needed. How is this treated? There is no cure for PHN. Treatment for PHN will focus on pain relief. Over-the-counter pain relievers do not usually relieve PHN pain. You may need to work with a pain specialist. Treatment may include:  Antidepressant medicines to help with pain and improve sleep.  Antiseizure medicines to relieve nerve pain.  Strong pain relievers (opioids).  A numbing patch worn on the skin (  lidocaine patch). Follow these instructions at home: It may take a long time to recover from PHN. Work closely with your health care provider, and have a good support system at home.  Take all medicines as directed by your health care provider.  Wear loose, comfortable clothing.  Cover sensitive areas with a dressing to reduce friction from clothing rubbing on the area.  If cold does not make your pain worse, try applying a cool compress or cooling gel pack to the area.  Talk to  your health care provider if you feel depressed or desperate. Living with long-term pain can be depressing. Contact a health care provider if:  Your medicine is not helping.  You are struggling to manage your pain at home. This information is not intended to replace advice given to you by your health care provider. Make sure you discuss any questions you have with your health care provider. Document Released: 07/26/2002 Document Revised: 10/11/2015 Document Reviewed: 04/26/2013 Elsevier Interactive Patient Education  2017 [employer].

## 2016-09-26 NOTE — Progress Notes (Signed)
Subjective:    Patient ID: Joanna Phillips, female    DOB: 07/04/1950, 66 y.o.   MRN: [id number]  Joanna Phillips is a 66 y.o. female presenting on 09/26/2016 for Chills (Patient here today C/O chills on Staurday and Tuesday.)   HPI Chills  Saturday night into Sunday morning had chills with shivering, shaking, left arm tingling, numbness without no sweats between 9p-1:30 am.  Pt slept 1:30-noon and woke up sweaty and weak.  She couldn't make her body walk easily.  She was able to take a few steps by holding onto furniture.  Was able to talk and drink water.  No known fever, n/v, constipation or diarrhea.  NO signs of infection other than lingering shingles.     Symptoms went away and pt went to work the next day and was "fine."  Occurred again on Tuesday night when she went to bed for a shorter time (30 minutes at most).  Pt and husband are questioning if it could have been a possible mini-stroke r/t old infarct noted on MRI a couple months ago. Pt has had past TIAs.  Saturday and Tuesday pt reports she had done more activity than usual since ending her breast cancer treatment and shingles.   Pt has DCIS with surgical removal and radiation that ended in February.   Shingles Recent shingles onset July and active lesions lasting through November.  She now has a few lingering lesions that will not heal (seeing Wound Care). They will scab over, then scabs come off and wound continues to drain serous fluid.  Persistent postherpetic neuralgia.  Amitriptyline helps at night.  Gabapentin helps pain, but does not relieve it completely.  She notices more pain when she is not taking gabapentin, but still has tenderness/pain to touch and cannot put any pressure on her head or right side of her face.  Shingles has worsened speech.  R deviation of tongue She has had some fatigue since radiation and diagnosis of DCIS / onset of shingles, but it has been more severe this week - Hard to stay awake.   She states  she feels like she is under a "Shingles cloud" that has affected her memory.   Social History  Substance Use Topics  . Smoking status: Never Smoker  . Smokeless tobacco: Never Used  . Alcohol use No    Review of Systems Per HPI unless specifically indicated above     Objective:    BP (!) 115/56 (BP Location: Right Arm, Patient Position: Sitting, Cuff Size: Large)   Pulse 91   Temp 98.3 F (36.8 C) (Oral)   Resp 16   Wt 223 lb (101.2 kg)   SpO2 99%   BMI 37.11 kg/m   Wt Readings from Last 3 Encounters:  09/26/16 223 lb (101.2 kg)  09/11/16 224 lb (101.6 kg)  07/24/16 219 lb 2.2 oz (99.4 kg)    Physical Exam  Constitutional: She is oriented to person, place, and time. She appears well-developed and well-nourished. No distress.  HENT:  Head: Normocephalic.  Nose: Nose normal.  Mouth/Throat: Oropharynx is clear and moist. No oropharyngeal exudate.  Herpetic lesions on right scalp.  Currently appear with serous drainage and granulation tissue. Increased swelling on R side of face/head  Eyes: Conjunctivae and EOM are normal. Pupils are equal, round, and reactive to light.  Neck: Normal range of motion. Neck supple. No JVD present. No tracheal deviation present.  right carotid bruit  Cardiovascular: Normal rate, regular rhythm, normal heart  sounds and intact distal pulses.   Pulmonary/Chest: Effort normal and breath sounds normal. No respiratory distress.  Musculoskeletal: Normal range of motion.  Lymphadenopathy:    She has no cervical adenopathy.  Neurological: She is alert and oriented to person, place, and time. She has normal reflexes. A cranial nerve deficit is present.  CN X: Tongue deviates to right, speech slurred Pos romberg  Skin: Skin is warm and dry.  Non-healing herpetic lesions on scalp   Psychiatric: She has a normal mood and affect. Her behavior is normal. Judgment and thought content normal.   Results for orders placed or performed in visit on 09/26/16    CBC with Differential/Platelet  Result Value Ref Range   WBC 11.8 (H) 3.4 - 10.8 x10E3/uL   RBC 4.03 3.77 - 5.28 x10E6/uL   Hemoglobin 11.0 (L) 11.1 - 15.9 g/dL   Hematocrit 34.1 34.0 - 46.6 %   MCV 85 79 - 97 fL   MCH 27.3 26.6 - 33.0 pg   MCHC 32.3 31.5 - 35.7 g/dL   RDW 15.2 12.3 - 15.4 %   Platelets 267 150 - 379 x10E3/uL   Neutrophils 38 Not Estab. %   Lymphs 53 Not Estab. %   Monocytes 8 Not Estab. %   Eos 1 Not Estab. %   Basos 0 Not Estab. %   Neutrophils Absolute 4.5 1.4 - 7.0 x10E3/uL   Lymphocytes Absolute 6.2 (H) 0.7 - 3.1 x10E3/uL   Monocytes Absolute 0.9 0.1 - 0.9 x10E3/uL   EOS (ABSOLUTE) 0.1 0.0 - 0.4 x10E3/uL   Basophils Absolute 0.0 0.0 - 0.2 x10E3/uL   Immature Granulocytes 0 Not Estab. %   Immature Grans (Abs) 0.0 0.0 - 0.1 x10E3/uL   Hematology Comments: Note:       Assessment & Plan:   Problem List Items Addressed This Visit      Nervous and Auditory   Post herpetic neuralgia Persistent and manageable for sleep.  Would like better daytime relief.  Plan: 1. Try Lyrica 150 mg twice daily.  Instructed pt that if worsening pain, resume gabapentin.  If more relief provided, can continue and increase dose as needed and as tolerated.   2. Continue valtrex to reduce viral load.   Relevant Medications   pregabalin (LYRICA) 150 MG capsule   valACYclovir (VALTREX) 500 MG tablet     Other   Disseminated zoster Non-healing wound on scalp.    Plan: 1. Return to wound care. 2. Continue Valtrex. 3. Manage pain with lyrica as above.   Relevant Medications   pregabalin (LYRICA) 150 MG capsule   valACYclovir (VALTREX) 500 MG tablet    Other Visit Diagnoses    Chills (without fever)    -  Primary Unknown etiology.  Possible infectious cause or possible association with TIA.  No focal infection identified except ongoing herpetic lesions from shingles.  Plan: 1. Check CBC evaluate WBC.  Results consistent and improved over previous values during radiation.   No immediate concern for acute infection. 2. Proceed with carotid dopplers evaluate bruit in presence of possible TIA.   Relevant Orders   US Carotid Duplex Bilateral   CBC with Differential/Platelet (Completed)   Hx of transient ischemic attack (TIA)     Pt with history of TIA.  Possible TIA repeated with symptoms of chills and left arm tinging and weakness.  R carotid bruit present.  Plan: 1. Proceed with carotid dopplers.   Relevant Orders   US Carotid Duplex Bilateral   Seasonal allergies  Stable and improved with medication.  Plan:  1. Continue loratadine.   Relevant Medications   loratadine (CLARITIN) 10 MG tablet      Meds ordered this encounter  Medications  . pregabalin (LYRICA) 150 MG capsule    Sig: Take 1 capsule (150 mg total) by mouth 2 (two) times daily.    Dispense:  60 capsule    Refill:  2    Order Specific Question:   Supervising Provider    Answer:   Olin Hauser [[id number]]  . loratadine (CLARITIN) 10 MG tablet    Sig: Take 1 tablet (10 mg total) by mouth daily.    Dispense:  30 tablet    Refill:  11    Order Specific Question:   Supervising Provider    Answer:   Olin Hauser [[id number]]  . valACYclovir (VALTREX) 500 MG tablet    Sig: Take 1 tablet (500 mg total) by mouth 2 (two) times daily.    Dispense:  180 tablet    Refill:  1    Order Specific Question:   Supervising Provider    Answer:   Olin Hauser [[id number]]      Follow up plan: Return 1-2 weeks as needed if symptoms worsen or fail to improve.   Cassell Smiles, DNP, AGPCNP-BC Adult Gerontology Primary Care Nurse Practitioner [facility] 09/26/2016, 1:29 PM

## 2016-09-27 LAB — CBC WITH DIFFERENTIAL/PLATELET
Basophils Absolute: 0 10*3/uL (ref 0.0–0.2)
Basos: 0 %
EOS (ABSOLUTE): 0.1 10*3/uL (ref 0.0–0.4)
Eos: 1 %
Hematocrit: 34.1 % (ref 34.0–46.6)
Hemoglobin: 11 g/dL — ABNORMAL LOW (ref 11.1–15.9)
Immature Grans (Abs): 0 10*3/uL (ref 0.0–0.1)
Immature Granulocytes: 0 %
Lymphocytes Absolute: 6.2 10*3/uL — ABNORMAL HIGH (ref 0.7–3.1)
Lymphs: 53 %
MCH: 27.3 pg (ref 26.6–33.0)
MCHC: 32.3 g/dL (ref 31.5–35.7)
MCV: 85 fL (ref 79–97)
Monocytes Absolute: 0.9 10*3/uL (ref 0.1–0.9)
Monocytes: 8 %
Neutrophils Absolute: 4.5 10*3/uL (ref 1.4–7.0)
Neutrophils: 38 %
Platelets: 267 10*3/uL (ref 150–379)
RBC: 4.03 x10E6/uL (ref 3.77–5.28)
RDW: 15.2 % (ref 12.3–15.4)
WBC: 11.8 10*3/uL — ABNORMAL HIGH (ref 3.4–10.8)

## 2016-09-29 ENCOUNTER — Telehealth: Payer: Self-pay

## 2016-09-29 NOTE — Telephone Encounter (Signed)
-----   Message from Mikey College, NP sent at 09/29/2016  9:55 AM EDT ----- CBC is consistent with prior values.  WBC is actually lower and closer to normal.  I do not see that there would be any signs of worsening infection as the cause of your chills.  I was unable to get the picture we took in clinic to upload.  I am not concerned for active infection within the wound or surrounding skin.  I would appreciate assistance with getting the wounds to heal.  Please call your wound care doctors and explain that your lesions scab, then scabs fall off.  Ask if they would prefer to see you again.    Let us know if there are any worsening symptoms of chills, arm weakness, leg weakness.

## 2016-09-29 NOTE — Progress Notes (Signed)
I have reviewed this encounter including the documentation in this note and/or discussed this patient with the provider, Cassell Smiles, AGPCNP-BC. I am certifying that I agree with the content of this note as supervising physician.  Nobie Putnam, [facility] 09/29/2016, 12:53 PM

## 2016-09-29 NOTE — Telephone Encounter (Signed)
I spoke with the pt and notified her. No questions or concern.

## 2016-10-07 ENCOUNTER — Telehealth: Payer: Self-pay | Admitting: Family Medicine

## 2016-10-07 NOTE — Telephone Encounter (Signed)
Pt called to check on status of MRI for blockage in artery.  Her call back number is [phone]

## 2016-10-07 NOTE — Telephone Encounter (Signed)
Carotid Doppler, bilateral scheduled for Friday, May 25th @ 8:45am. The pt was notified of appt.

## 2016-10-10 ENCOUNTER — Ambulatory Visit
Admission: RE | Admit: 2016-10-10 | Discharge: 2016-10-10 | Disposition: A | Discharge status: Home / Self Care | Payer: Medicare Other | Source: Ambulatory Visit | Attending: Nurse Practitioner | Admitting: Nurse Practitioner

## 2016-10-10 DIAGNOSIS — Z8673 Personal history of transient ischemic attack (TIA), and cerebral infarction without residual deficits: Secondary | ICD-10-CM | POA: Diagnosis not present

## 2016-10-10 DIAGNOSIS — R6883 Chills (without fever): Secondary | ICD-10-CM | POA: Diagnosis not present

## 2016-10-10 DIAGNOSIS — I6523 Occlusion and stenosis of bilateral carotid arteries: Secondary | ICD-10-CM | POA: Insufficient documentation

## 2016-10-17 DIAGNOSIS — C9110 Chronic lymphocytic leukemia of B-cell type not having achieved remission: Secondary | ICD-10-CM | POA: Diagnosis not present

## 2016-10-17 DIAGNOSIS — D0590 Unspecified type of carcinoma in situ of unspecified breast: Secondary | ICD-10-CM | POA: Diagnosis not present

## 2016-10-27 ENCOUNTER — Ambulatory Visit: Payer: Medicare Other | Admitting: Hematology and Oncology

## 2016-11-02 NOTE — Progress Notes (Signed)
[facility] day:  11/03/16  Chief Complaint: Joanna Phillips is a 66 y.o. female with chronic lymphocytic leukemia (CLL) and right breast DCIS who is seen for 3 month assessment.  HPI:  The patient was last seen in the medical oncology clinic on 07/24/2016.  At that time, she denied any B symptoms.  She has chronic post herpetic neuralgia pain.  She reported 5/10 pain on scalp, right face, and right ear.  Exam revealed a healing area of her scalp s/p debridement at the wound care center.  She continued Arimidex.  During the interim, she has has a rash on various parts of her body.  Rash is fleeting.  Rash has been predominantly on her neck, arms, and chest.  She states that [facility] revealed her following her shingles rash.  She states that fatigue and late in the afternoon affects her speech.   Past Medical History:  Diagnosis Date  . Arthritis   . CLL (chronic lymphocytic leukemia) ([place]) 2011  . DCIS (ductal carcinoma in situ) 03/11/2016  . GERD (gastroesophageal reflux disease)     Past Surgical History:  Procedure Laterality Date  . BREAST BIOPSY Right 02/12/2016   path pending  . CHOLECYSTECTOMY  1996  . COLONOSCOPY  2013    Family History  Problem Relation Age of Onset  . Cancer Father        lung  . Diabetes Mother   . Breast cancer Maternal Aunt     Social History:  reports that she has never smoked. She has never used smokeless tobacco. She reports that she does not drink alcohol or use drugs.  She lives in [place].  The patient is accompanied by her husband, Joanna Phillips, today.  Allergies:  Allergies  Allergen Reactions  . Amoxicillin Hives  . Hydrocodone Itching  . Sulfamethoxazole-Trimethoprim Nausea Only  . Allopurinol Rash    Current Medications: Current Outpatient Prescriptions  Medication Sig Dispense Refill  . amitriptyline (ELAVIL) 50 MG tablet Take 1 tablet (50 mg total) by mouth at bedtime. 30 tablet 11  .  anastrozole (ARIMIDEX) 1 MG tablet Take 1 tablet (1 mg total) by mouth daily. 90 tablet 3  . fluticasone (FLONASE) 50 MCG/ACT nasal spray Place 2 sprays into both nostrils daily. 16 g 6  . loratadine (CLARITIN) 10 MG tablet Take 1 tablet (10 mg total) by mouth daily. 30 tablet 11  . omeprazole (PRILOSEC) 20 MG capsule Take 1 capsule by mouth two times daily 180 capsule 3  . pregabalin (LYRICA) 150 MG capsule Take 1 capsule (150 mg total) by mouth 2 (two) times daily. 60 capsule 2  . valACYclovir (VALTREX) 500 MG tablet Take 1 tablet (500 mg total) by mouth 2 (two) times daily. 180 tablet 1   No current facility-administered medications for this visit.     Review of Systems:  GENERAL:  Feels "good".  No fevers or sweats.  Weight stable. PERFORMANCE STATUS (ECOG):  1 HEENT:  Right residual ear pain.  No loss of hearing.  Speech affected by lateness in the day and fatigue.  No visual changes, runny nose, sore throat, mouth sores or tenderness. Lungs: No shortness of breath or cough.  No hemoptysis. Cardiac: Paroxysmal nocturnal dyspnea. No chest pain, palpitations, or orthopnea. GI:  No nausea, vomiting, diarrhea, constipation, melena or hematochezia. GU:  No urgency, frequency, dysuria, or hematuria.  Musculoskeletal: No back pain.  No joint pain.  No muscle tenderness. Extremities:  No pain or  swelling. Skin:  Scalp healing.  No rashes or skin changes. Neuro:  Herpetic neuralgia.  No headache, numbness or weakness, balance or coordination issues. Endocrine:  No diabetes, thyroid issues, hot flashes or night sweats. Psych:  No mood changes, depression or anxiety. Pain:  No focal pain. Review of systems:  All other systems reviewed and found to be negative.  Physical Exam: Blood pressure 131/88, pulse 78, temperature 99.1 F (37.3 C), temperature source Tympanic, resp. rate 18, weight 219 lb 3 oz (99.4 kg). GENERAL:  Well developed, well nourished, woman sitting comfortably in the exam  room in no acute distress.  MENTAL STATUS:  Alert and oriented to person, place and time. HEAD:   Short graying hair. 3 cm area of alopecia with new skin.  No vesicles.  Face symmetric.  No Cushingoid features. EYES:  Glasses.  Blue eyes.  Pupils equal round and reactive to light and accomodation.  No conjunctivitis or scleral icterus. ENT:  Speech slightly affected, improved.  Tongue deviates to the right secondary to atrophy.  Oropharynx clear without lesions. Mucous membranes moist.   RESPIRATORY:  Clear to auscultation without rales, wheezes or rhonchi. CARDIOVASCULAR:  Regular rate and rhythm without murmur, rub or gallop. BREAST:  Right breast with well healed incision at 3 o'clock position.  No masses, skin changes or nipple discharge.  Left breast without masses, skin changes or nipple discharge.  ABDOMEN:  Soft, non-tender, with active bowel sounds, and no hepatosplenomegaly.  No masses. SKIN: Scalp, improved.  No vesicles.  Left back rash. EXTREMITIES: No edema, no skin discoloration or tenderness.  No palpable cords. LYMPH NODES:  No palpable cervical, supraclavicular, axillary or inguinal adenopathy  NEUROLOGICAL: Unremarkable. PSYCH:  Appropriate.   No visits with results within 3 Day(s) from this visit.  Latest known visit with results is:  Office Visit on 09/26/2016  Component Date Value Ref Range Status  . WBC 09/26/2016 11.8* 3.4 - 10.8 x10E3/uL Final  . RBC 09/26/2016 4.03  3.77 - 5.28 x10E6/uL Final  . Hemoglobin 09/26/2016 11.0* 11.1 - 15.9 g/dL Final  . Hematocrit 09/26/2016 34.1  34.0 - 46.6 % Final  . MCV 09/26/2016 85  79 - 97 fL Final  . MCH 09/26/2016 27.3  26.6 - 33.0 pg Final  . MCHC 09/26/2016 32.3  31.5 - 35.7 g/dL Final  . RDW 09/26/2016 15.2  12.3 - 15.4 % Final  . Platelets 09/26/2016 267  150 - 379 x10E3/uL Final  . Neutrophils 09/26/2016 38  Not Estab. % Final  . Lymphs 09/26/2016 53  Not Estab. % Final  . Monocytes 09/26/2016 8  Not Estab. % Final   . Eos 09/26/2016 1  Not Estab. % Final  . Basos 09/26/2016 0  Not Estab. % Final  . Neutrophils Absolute 09/26/2016 4.5  1.4 - 7.0 x10E3/uL Final  . Lymphocytes Absolute 09/26/2016 6.2* 0.7 - 3.1 x10E3/uL Final  . Monocytes Absolute 09/26/2016 0.9  0.1 - 0.9 x10E3/uL Final  . EOS (ABSOLUTE) 09/26/2016 0.1  0.0 - 0.4 x10E3/uL Final  . Basophils Absolute 09/26/2016 0.0  0.0 - 0.2 x10E3/uL Final  . Immature Granulocytes 09/26/2016 0  Not Estab. % Final  . Immature Grans (Abs) 09/26/2016 0.0  0.0 - 0.1 x10E3/uL Final  . Hematology Comments: 09/26/2016 Note:   Final   Verified by microscopic examination.   [facility] Labs: 06/08/2015: hematocrit of 35.1, hemoglobin 11.7, MCV 81, platelets 225,000, WBC 17,200 with an ANC of 3100.  Absolutle lymphocyte count (ALC) 12,200.  Uric  acid 8.0 (elevated; last check 7.7- elevated).  Creatinine 0.90.  LDH 217. 12/05/2015: hematocrit of 34.1, hemoglobin 10.9, MCV 81, platelets 213,000, WBC 17,700 with an ANC of 3100.  ALC 13,600.  Uric acid 6.0.  Creatinine 0.81.  LDH 212. 05/14/2016: hematocrit of 34.0, hemoglobin 11.1, MCV 83, platelets 382,000, WBC 13,800 with an ANC of 4600.  ALC 7,700.   06/11/2016: hematocrit of 36.0, hemoglobin 11.5, MCV 82, platelets 213,000, WBC 8,100 with an ANC of 3300.  ALC 3,900.  Creatinine 0.84.  Ferritin 79.  Iron saturation 12%.  Reticulocyte count 1.1%.  Coombs negative. 07/18/2016: hematocrit of 35.6, hemoglobin 11.8, MCV 83, platelets 200,000, WBC 6,900 with an ANC of 2600.  ALC 3,600.  Creatinine 0.78.  LFTs normal. 10/17/2016: hematocrit of 35.9, hemoglobin 11.8, MCV 84, platelets 248,000, WBC 10,500 with an ANC of 3200.  ALC 3,600.  Creatinine 0.93.  LFTs normal.   Assessment:  CHEQUITA MOFIELD is a 66 y.o. female with stage I chronic lymphocytic leukemia diagnosed in 2011.  She was diagnosed with right breast DCIS in 02/2016.  CLL:  She presented with mild lymphocytosis and small cervical adenopathy on routine physical  exam.  CBC revealed only lymphocytosis.  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  SPEP was normal.  Chest, abdomen, and pelvic CT scan revealed small diffuse nodes.  DCIS:  She underwent wire-guided right partial mastectomy on 03/11/2016 at [facility].  Pathology revealed a 3.9 cm grade III DCIS.  ER was strongly positive (Allred score 8) in 100% of cells.  PR was strongly positive (Allred score 6) in 30% of cells.  Pathologic stage was TisNx.  She completed breast radiation on 06/25/2016.    She began Arimidex on 06/26/2016.  She is tolerating treatment well.   She has a history of intermittent rectal and vaginal bleeding in 2015.  She was seen by gynecology at the [facility].  Pelvic exam and ultrasound revealed fibroids.   She has recurrent vaginal bleeding.  EGD on 11/11/2011 revealed a hiatal hernia and gastritis.  Colonoscopy on 11/11/2011 revealed diverticulosis in the ascending, descending, and sigmoid colon.  Stool guaiacs x 3 were negative in 06/2014.  She denies any melena or hematochezia.   She has a history of varicella zoster involving the right side of her scalp.  She was admitted to [facility] from 12/21/2015 - 12/25/2015 with disseminated varicella zoster.  She was treated with IV acyclovir then switched to oral valacyclovir (completed 01/10/2016).  She is on prophylactic valacyclovir.  Scalp has undergone debridement at the wound care center.  Bone density study on 05/21/2016 was normal with a T score of -0.3 in the AP spine L1-L4 and -0.3 in the left femoral neck.  Symptomatically, she denies any B symptoms.  She has chronic post herpetic neuralgia pain.  She has had a fleeting rash.  Exam reveals no adenopathy or hepatosplenomegaly.  Plan: 1.  Review labs from 10/17/2016.  Counts are stable. 2.  Continue Arimidex.  3.  Dermatology consult:  fleeting rashes. 4.  RTC in 3 months for MD assessment and review of [facility] labs.    Lequita Asal, MD  11/03/2016, 2:30 PM

## 2016-11-03 ENCOUNTER — Inpatient Hospital Stay: Payer: Medicare Other | Attending: Hematology and Oncology | Admitting: Hematology and Oncology

## 2016-11-03 ENCOUNTER — Encounter: Payer: Self-pay | Admitting: Hematology and Oncology

## 2016-11-03 VITALS — BP 131/88 | HR 78 | Temp 99.1°F | Resp 18 | Wt 219.2 lb

## 2016-11-03 DIAGNOSIS — D0511 Intraductal carcinoma in situ of right breast: Secondary | ICD-10-CM | POA: Insufficient documentation

## 2016-11-03 DIAGNOSIS — C9190 Lymphoid leukemia, unspecified not having achieved remission: Secondary | ICD-10-CM | POA: Diagnosis not present

## 2016-11-03 DIAGNOSIS — K219 Gastro-esophageal reflux disease without esophagitis: Secondary | ICD-10-CM | POA: Diagnosis not present

## 2016-11-03 DIAGNOSIS — C9110 Chronic lymphocytic leukemia of B-cell type not having achieved remission: Secondary | ICD-10-CM

## 2016-11-03 DIAGNOSIS — Z803 Family history of malignant neoplasm of breast: Secondary | ICD-10-CM | POA: Diagnosis not present

## 2016-11-03 DIAGNOSIS — Z170 Estrogen receptor positive status [ER+]: Secondary | ICD-10-CM | POA: Diagnosis not present

## 2016-11-03 DIAGNOSIS — R21 Rash and other nonspecific skin eruption: Secondary | ICD-10-CM | POA: Insufficient documentation

## 2016-11-03 DIAGNOSIS — R5383 Other fatigue: Secondary | ICD-10-CM

## 2016-11-03 DIAGNOSIS — Z923 Personal history of irradiation: Secondary | ICD-10-CM | POA: Diagnosis not present

## 2016-11-03 DIAGNOSIS — Z801 Family history of malignant neoplasm of trachea, bronchus and lung: Secondary | ICD-10-CM | POA: Insufficient documentation

## 2016-11-03 DIAGNOSIS — B0229 Other postherpetic nervous system involvement: Secondary | ICD-10-CM | POA: Insufficient documentation

## 2016-11-03 DIAGNOSIS — M129 Arthropathy, unspecified: Secondary | ICD-10-CM | POA: Insufficient documentation

## 2016-11-03 DIAGNOSIS — Z79899 Other long term (current) drug therapy: Secondary | ICD-10-CM | POA: Diagnosis not present

## 2016-11-03 DIAGNOSIS — Z79811 Long term (current) use of aromatase inhibitors: Secondary | ICD-10-CM | POA: Diagnosis not present

## 2016-11-03 DIAGNOSIS — Z8719 Personal history of other diseases of the digestive system: Secondary | ICD-10-CM | POA: Diagnosis not present

## 2016-11-03 DIAGNOSIS — Z9011 Acquired absence of right breast and nipple: Secondary | ICD-10-CM | POA: Diagnosis not present

## 2016-11-03 NOTE — Progress Notes (Signed)
Patient states a few weeks ago she had chills during the night.  Since that time she has developed a rash on various parts of her body.  It will clear up on one part of her body and then break out on another part.  Today she has rash on her neck and chest area.  Otherwise, no complaints.

## 2016-11-06 DIAGNOSIS — L570 Actinic keratosis: Secondary | ICD-10-CM | POA: Diagnosis not present

## 2016-11-06 DIAGNOSIS — B999 Unspecified infectious disease: Secondary | ICD-10-CM | POA: Diagnosis not present

## 2016-11-06 DIAGNOSIS — L508 Other urticaria: Secondary | ICD-10-CM | POA: Diagnosis not present

## 2016-11-17 ENCOUNTER — Encounter: Payer: Self-pay | Admitting: Hematology and Oncology

## 2016-11-20 DIAGNOSIS — B999 Unspecified infectious disease: Secondary | ICD-10-CM | POA: Diagnosis not present

## 2016-11-20 DIAGNOSIS — L508 Other urticaria: Secondary | ICD-10-CM | POA: Diagnosis not present

## 2016-11-27 DIAGNOSIS — B029 Zoster without complications: Secondary | ICD-10-CM | POA: Diagnosis not present

## 2016-12-12 DIAGNOSIS — L508 Other urticaria: Secondary | ICD-10-CM | POA: Diagnosis not present

## 2016-12-12 DIAGNOSIS — B999 Unspecified infectious disease: Secondary | ICD-10-CM | POA: Diagnosis not present

## 2016-12-19 DIAGNOSIS — Z1231 Encounter for screening mammogram for malignant neoplasm of breast: Secondary | ICD-10-CM | POA: Diagnosis not present

## 2016-12-19 DIAGNOSIS — Z803 Family history of malignant neoplasm of breast: Secondary | ICD-10-CM | POA: Diagnosis not present

## 2016-12-19 DIAGNOSIS — Z853 Personal history of malignant neoplasm of breast: Secondary | ICD-10-CM | POA: Diagnosis not present

## 2016-12-19 DIAGNOSIS — D0511 Intraductal carcinoma in situ of right breast: Secondary | ICD-10-CM | POA: Diagnosis not present

## 2016-12-23 DIAGNOSIS — M50322 Other cervical disc degeneration at C5-C6 level: Secondary | ICD-10-CM | POA: Diagnosis not present

## 2016-12-23 DIAGNOSIS — M50321 Other cervical disc degeneration at C4-C5 level: Secondary | ICD-10-CM | POA: Diagnosis not present

## 2016-12-23 DIAGNOSIS — M4602 Spinal enthesopathy, cervical region: Secondary | ICD-10-CM | POA: Diagnosis not present

## 2016-12-23 DIAGNOSIS — M9901 Segmental and somatic dysfunction of cervical region: Secondary | ICD-10-CM | POA: Diagnosis not present

## 2016-12-24 DIAGNOSIS — M50321 Other cervical disc degeneration at C4-C5 level: Secondary | ICD-10-CM | POA: Diagnosis not present

## 2016-12-24 DIAGNOSIS — M9901 Segmental and somatic dysfunction of cervical region: Secondary | ICD-10-CM | POA: Diagnosis not present

## 2016-12-24 DIAGNOSIS — M50322 Other cervical disc degeneration at C5-C6 level: Secondary | ICD-10-CM | POA: Diagnosis not present

## 2016-12-24 DIAGNOSIS — M4602 Spinal enthesopathy, cervical region: Secondary | ICD-10-CM | POA: Diagnosis not present

## 2016-12-29 DIAGNOSIS — M9901 Segmental and somatic dysfunction of cervical region: Secondary | ICD-10-CM | POA: Diagnosis not present

## 2016-12-29 DIAGNOSIS — M50322 Other cervical disc degeneration at C5-C6 level: Secondary | ICD-10-CM | POA: Diagnosis not present

## 2016-12-29 DIAGNOSIS — M4602 Spinal enthesopathy, cervical region: Secondary | ICD-10-CM | POA: Diagnosis not present

## 2016-12-29 DIAGNOSIS — M50321 Other cervical disc degeneration at C4-C5 level: Secondary | ICD-10-CM | POA: Diagnosis not present

## 2016-12-31 DIAGNOSIS — M4602 Spinal enthesopathy, cervical region: Secondary | ICD-10-CM | POA: Diagnosis not present

## 2016-12-31 DIAGNOSIS — M9901 Segmental and somatic dysfunction of cervical region: Secondary | ICD-10-CM | POA: Diagnosis not present

## 2016-12-31 DIAGNOSIS — M50321 Other cervical disc degeneration at C4-C5 level: Secondary | ICD-10-CM | POA: Diagnosis not present

## 2016-12-31 DIAGNOSIS — M50322 Other cervical disc degeneration at C5-C6 level: Secondary | ICD-10-CM | POA: Diagnosis not present

## 2017-01-01 DIAGNOSIS — M50322 Other cervical disc degeneration at C5-C6 level: Secondary | ICD-10-CM | POA: Diagnosis not present

## 2017-01-01 DIAGNOSIS — M4602 Spinal enthesopathy, cervical region: Secondary | ICD-10-CM | POA: Diagnosis not present

## 2017-01-01 DIAGNOSIS — M9901 Segmental and somatic dysfunction of cervical region: Secondary | ICD-10-CM | POA: Diagnosis not present

## 2017-01-01 DIAGNOSIS — M50321 Other cervical disc degeneration at C4-C5 level: Secondary | ICD-10-CM | POA: Diagnosis not present

## 2017-01-05 DIAGNOSIS — M50321 Other cervical disc degeneration at C4-C5 level: Secondary | ICD-10-CM | POA: Diagnosis not present

## 2017-01-05 DIAGNOSIS — M50322 Other cervical disc degeneration at C5-C6 level: Secondary | ICD-10-CM | POA: Diagnosis not present

## 2017-01-05 DIAGNOSIS — M4602 Spinal enthesopathy, cervical region: Secondary | ICD-10-CM | POA: Diagnosis not present

## 2017-01-05 DIAGNOSIS — M9901 Segmental and somatic dysfunction of cervical region: Secondary | ICD-10-CM | POA: Diagnosis not present

## 2017-01-07 DIAGNOSIS — M50321 Other cervical disc degeneration at C4-C5 level: Secondary | ICD-10-CM | POA: Diagnosis not present

## 2017-01-07 DIAGNOSIS — M4602 Spinal enthesopathy, cervical region: Secondary | ICD-10-CM | POA: Diagnosis not present

## 2017-01-07 DIAGNOSIS — M50322 Other cervical disc degeneration at C5-C6 level: Secondary | ICD-10-CM | POA: Diagnosis not present

## 2017-01-07 DIAGNOSIS — M9901 Segmental and somatic dysfunction of cervical region: Secondary | ICD-10-CM | POA: Diagnosis not present

## 2017-01-08 DIAGNOSIS — M50321 Other cervical disc degeneration at C4-C5 level: Secondary | ICD-10-CM | POA: Diagnosis not present

## 2017-01-08 DIAGNOSIS — M50322 Other cervical disc degeneration at C5-C6 level: Secondary | ICD-10-CM | POA: Diagnosis not present

## 2017-01-08 DIAGNOSIS — M9901 Segmental and somatic dysfunction of cervical region: Secondary | ICD-10-CM | POA: Diagnosis not present

## 2017-01-08 DIAGNOSIS — M4602 Spinal enthesopathy, cervical region: Secondary | ICD-10-CM | POA: Diagnosis not present

## 2017-01-12 DIAGNOSIS — B0229 Other postherpetic nervous system involvement: Secondary | ICD-10-CM | POA: Diagnosis not present

## 2017-01-12 DIAGNOSIS — M79602 Pain in left arm: Secondary | ICD-10-CM | POA: Diagnosis not present

## 2017-01-12 DIAGNOSIS — L508 Other urticaria: Secondary | ICD-10-CM | POA: Diagnosis not present

## 2017-01-12 DIAGNOSIS — M542 Cervicalgia: Secondary | ICD-10-CM | POA: Diagnosis not present

## 2017-01-12 DIAGNOSIS — M50322 Other cervical disc degeneration at C5-C6 level: Secondary | ICD-10-CM | POA: Diagnosis not present

## 2017-01-12 DIAGNOSIS — B999 Unspecified infectious disease: Secondary | ICD-10-CM | POA: Diagnosis not present

## 2017-01-12 DIAGNOSIS — M9901 Segmental and somatic dysfunction of cervical region: Secondary | ICD-10-CM | POA: Diagnosis not present

## 2017-01-12 DIAGNOSIS — M4602 Spinal enthesopathy, cervical region: Secondary | ICD-10-CM | POA: Diagnosis not present

## 2017-01-12 DIAGNOSIS — M50321 Other cervical disc degeneration at C4-C5 level: Secondary | ICD-10-CM | POA: Diagnosis not present

## 2017-01-12 DIAGNOSIS — M25512 Pain in left shoulder: Secondary | ICD-10-CM | POA: Diagnosis not present

## 2017-01-14 ENCOUNTER — Other Ambulatory Visit: Payer: Self-pay | Admitting: Neurology

## 2017-01-14 DIAGNOSIS — M542 Cervicalgia: Secondary | ICD-10-CM

## 2017-01-21 ENCOUNTER — Other Ambulatory Visit: Payer: Self-pay

## 2017-01-21 DIAGNOSIS — B0229 Other postherpetic nervous system involvement: Secondary | ICD-10-CM

## 2017-01-22 ENCOUNTER — Encounter: Payer: Self-pay | Admitting: Radiation Oncology

## 2017-01-22 ENCOUNTER — Ambulatory Visit
Admission: RE | Admit: 2017-01-22 | Discharge: 2017-01-22 | Disposition: A | Discharge status: Home / Self Care | Payer: Medicare Other | Source: Ambulatory Visit | Attending: Radiation Oncology | Admitting: Radiation Oncology

## 2017-01-22 VITALS — BP 133/79 | HR 107 | Temp 99.8°F | Resp 22 | Wt 218.4 lb

## 2017-01-22 DIAGNOSIS — Z79811 Long term (current) use of aromatase inhibitors: Secondary | ICD-10-CM | POA: Insufficient documentation

## 2017-01-22 DIAGNOSIS — M79601 Pain in right arm: Secondary | ICD-10-CM | POA: Diagnosis not present

## 2017-01-22 DIAGNOSIS — D0511 Intraductal carcinoma in situ of right breast: Secondary | ICD-10-CM | POA: Diagnosis not present

## 2017-01-22 DIAGNOSIS — Z923 Personal history of irradiation: Secondary | ICD-10-CM | POA: Insufficient documentation

## 2017-01-22 DIAGNOSIS — Z170 Estrogen receptor positive status [ER+]: Secondary | ICD-10-CM | POA: Diagnosis not present

## 2017-01-22 NOTE — Progress Notes (Signed)
Radiation Oncology Follow up Note  Name: Joanna Phillips   Date:   01/22/2017 MRN:  [id number] DOB: 10-03-1950    This 66 y.o. female presents to the clinic today for six-month follow-up status post whole breast radiation to her right breast for ER/PR positive ductal carcinoma in situ.  REFERRING PROVIDER: Arlis Porta., MD  HPI: Patient is a 66 year old female now seen out 6 months having completed whole breast radiation to her right breast for ER/PR positive ductal carcinoma in situ. She seen today in routine follow-up and is doing well. She specifically denies breast tenderness cough or bone pain. She does have a stiff neck and some right arm pain from a pinched nerve. She's currently on arimadex tolerate that well without side effect.. For her neck she has an MRI scan of her cervical spine ordered to be performed September 12. Her last mammogram was back in September 2017.  COMPLICATIONS OF TREATMENT: none  FOLLOW UP COMPLIANCE: keeps appointments   PHYSICAL EXAM:  BP 133/79   Pulse (!) 107   Temp 99.8 F (37.7 C)   Resp (!) 22   Wt 218 lb 5.9 oz (99 kg)   BMI 36.34 kg/m  Lungs are clear to A&P cardiac examination essentially unremarkable with regular rate and rhythm. No dominant mass or nodularity is noted in either breast in 2 positions examined. Incision is well-healed. No axillary or supraclavicular adenopathy is appreciated. Cosmetic result is excellent. Well-developed well-nourished patient in NAD. HEENT reveals PERLA, EOMI, discs not visualized.  Oral cavity is clear. No oral mucosal lesions are identified. Neck is clear without evidence of cervical or supraclavicular adenopathy. Lungs are clear to A&P. Cardiac examination is essentially unremarkable with regular rate and rhythm without murmur rub or thrill. Abdomen is benign with no organomegaly or masses noted. Motor sensory and DTR levels are equal and symmetric in the upper and lower extremities. Cranial nerves II through  XII are grossly intact. Proprioception is intact. No peripheral adenopathy or edema is identified. No motor or sensory levels are noted. Crude visual fields are within normal range.  RADIOLOGY RESULTS: No current films for review  PLAN: Patient assures me follow-up mammograms have been ordered. Do not see that in the ordering schedule although follow up on that. Otherwise she continues to do well with no evidence of disease. I've asked to see her back in 6 months for follow-up. Patient knows to call sooner with any concerns.  I would like to take this opportunity to thank you for allowing me to participate in the care of your patient.Armstead Peaks., MD

## 2017-01-23 MED ORDER — AMITRIPTYLINE HCL 50 MG PO TABS
50.0000 mg | ORAL_TABLET | Freq: Every day | ORAL | 0 refills | Status: DC
Start: 2017-01-23 — End: 2017-03-01

## 2017-01-28 ENCOUNTER — Ambulatory Visit
Admission: RE | Admit: 2017-01-28 | Discharge: 2017-01-28 | Disposition: A | Discharge status: Home / Self Care | Payer: Medicare Other | Source: Ambulatory Visit | Attending: Neurology | Admitting: Neurology

## 2017-01-28 DIAGNOSIS — M79602 Pain in left arm: Secondary | ICD-10-CM | POA: Insufficient documentation

## 2017-01-28 DIAGNOSIS — M542 Cervicalgia: Secondary | ICD-10-CM | POA: Diagnosis not present

## 2017-01-28 DIAGNOSIS — M4802 Spinal stenosis, cervical region: Secondary | ICD-10-CM | POA: Insufficient documentation

## 2017-02-02 DIAGNOSIS — M4802 Spinal stenosis, cervical region: Secondary | ICD-10-CM | POA: Diagnosis not present

## 2017-02-02 DIAGNOSIS — M75102 Unspecified rotator cuff tear or rupture of left shoulder, not specified as traumatic: Secondary | ICD-10-CM | POA: Diagnosis not present

## 2017-02-02 DIAGNOSIS — D0511 Intraductal carcinoma in situ of right breast: Secondary | ICD-10-CM | POA: Diagnosis not present

## 2017-02-02 DIAGNOSIS — M25512 Pain in left shoulder: Secondary | ICD-10-CM | POA: Diagnosis not present

## 2017-02-02 DIAGNOSIS — C9190 Lymphoid leukemia, unspecified not having achieved remission: Secondary | ICD-10-CM | POA: Diagnosis not present

## 2017-02-02 DIAGNOSIS — M19012 Primary osteoarthritis, left shoulder: Secondary | ICD-10-CM | POA: Diagnosis not present

## 2017-02-05 ENCOUNTER — Inpatient Hospital Stay: Payer: Medicare Other | Attending: Hematology and Oncology | Admitting: Hematology and Oncology

## 2017-02-05 ENCOUNTER — Encounter: Payer: Self-pay | Admitting: Hematology and Oncology

## 2017-02-05 VITALS — BP 125/84 | HR 98 | Temp 99.3°F | Resp 16 | Wt 215.7 lb

## 2017-02-05 DIAGNOSIS — D259 Leiomyoma of uterus, unspecified: Secondary | ICD-10-CM | POA: Diagnosis not present

## 2017-02-05 DIAGNOSIS — M542 Cervicalgia: Secondary | ICD-10-CM | POA: Diagnosis not present

## 2017-02-05 DIAGNOSIS — M1990 Unspecified osteoarthritis, unspecified site: Secondary | ICD-10-CM | POA: Insufficient documentation

## 2017-02-05 DIAGNOSIS — Z8719 Personal history of other diseases of the digestive system: Secondary | ICD-10-CM | POA: Insufficient documentation

## 2017-02-05 DIAGNOSIS — K219 Gastro-esophageal reflux disease without esophagitis: Secondary | ICD-10-CM | POA: Insufficient documentation

## 2017-02-05 DIAGNOSIS — Z79899 Other long term (current) drug therapy: Secondary | ICD-10-CM | POA: Diagnosis not present

## 2017-02-05 DIAGNOSIS — Z9181 History of falling: Secondary | ICD-10-CM | POA: Diagnosis not present

## 2017-02-05 DIAGNOSIS — C9190 Lymphoid leukemia, unspecified not having achieved remission: Secondary | ICD-10-CM | POA: Diagnosis not present

## 2017-02-05 DIAGNOSIS — Z803 Family history of malignant neoplasm of breast: Secondary | ICD-10-CM | POA: Insufficient documentation

## 2017-02-05 DIAGNOSIS — C9110 Chronic lymphocytic leukemia of B-cell type not having achieved remission: Secondary | ICD-10-CM

## 2017-02-05 DIAGNOSIS — D0511 Intraductal carcinoma in situ of right breast: Secondary | ICD-10-CM | POA: Diagnosis not present

## 2017-02-05 DIAGNOSIS — Z79811 Long term (current) use of aromatase inhibitors: Secondary | ICD-10-CM | POA: Insufficient documentation

## 2017-02-05 DIAGNOSIS — B0229 Other postherpetic nervous system involvement: Secondary | ICD-10-CM | POA: Diagnosis not present

## 2017-02-05 DIAGNOSIS — Z9049 Acquired absence of other specified parts of digestive tract: Secondary | ICD-10-CM | POA: Diagnosis not present

## 2017-02-05 DIAGNOSIS — Z170 Estrogen receptor positive status [ER+]: Secondary | ICD-10-CM | POA: Insufficient documentation

## 2017-02-05 DIAGNOSIS — Z801 Family history of malignant neoplasm of trachea, bronchus and lung: Secondary | ICD-10-CM | POA: Insufficient documentation

## 2017-02-05 DIAGNOSIS — D649 Anemia, unspecified: Secondary | ICD-10-CM

## 2017-02-05 NOTE — Progress Notes (Signed)
Joanna Phillips day:  02/05/17  Chief Complaint: Joanna Phillips is a 66 y.o. female with chronic lymphocytic leukemia (CLL) and right breast DCIS who is seen for 3 month assessment.  HPI:  The patient was last seen in the medical oncology Phillips on 11/03/2016.  At that time, she denied any B symptoms.  She had chronic post herpetic neuralgia pain.  She had a fleeting rash.  Exam revealed no adenopathy or hepatosplenomegaly.  She continued Arimidex.  Dermatology was consulted.  Bilateral mammogram at [facility] on 12/19/2016 revealed no evidence of malignancy.   There was interval development of post lumpectomy changes.  She saw Dr. Essie Hart, breast surgeon at [facility], on 12/19/2016.  Exam was normal.  She has a follow-up in 1 year.  During the interim, she has done well. She has pain in her bilateral upper extremities. Patient has a cortisone injection in her LEFT shoulder last week. She is seeing orthopedics and was diagnosed with osteoarthritis. She sustained a fall x 1 month ago. She had pain in her neck. She started seeing a chiropractor and a subsequent MRI.   She denies B symptoms. Patient denies interval infections. Diet is good. She eats meat and green leafy vegetables on a regular basis. She has had no significant weight loss.  Weight is down 4 pounds since last visit.  She denies hematochezia and melena; last colonoscopy was in 2013.  She continues to have post herpetic neuralgia symptoms.    Past Medical History:  Diagnosis Date  . Arthritis   . CLL (chronic lymphocytic leukemia) ([place]) 2011  . DCIS (ductal carcinoma in situ) 03/11/2016  . GERD (gastroesophageal reflux disease)     Past Surgical History:  Procedure Laterality Date  . BREAST BIOPSY Right 02/12/2016   path pending  . CHOLECYSTECTOMY  1996  . COLONOSCOPY  2013    Family History  Problem Relation Age of Onset  . Cancer Father        lung  . Diabetes Mother   . Breast  cancer Maternal Aunt     Social History:  reports that she has never smoked. She has never used smokeless tobacco. She reports that she does not drink alcohol or use drugs.  She lives in [place].  The patient is alone today.  Allergies:  Allergies  Allergen Reactions  . Amoxicillin Hives  . Hydrocodone Itching  . Sulfamethoxazole-Trimethoprim Nausea Only  . Allopurinol Rash    Current Medications: Current Outpatient Prescriptions  Medication Sig Dispense Refill  . amitriptyline (ELAVIL) 50 MG tablet Take 1 tablet (50 mg total) by mouth at bedtime. NEEDS APPOINTMENT 30 tablet 0  . anastrozole (ARIMIDEX) 1 MG tablet Take 1 tablet (1 mg total) by mouth daily. 90 tablet 3  . fluticasone (FLONASE) 50 MCG/ACT nasal spray Place 2 sprays into both nostrils daily. 16 g 6  . gabapentin (NEURONTIN) 800 MG tablet Take 800 mg by mouth 3 (three) times daily.    Marland Kitchen ibuprofen (ADVIL,MOTRIN) 800 MG tablet Take by mouth.    . montelukast (SINGULAIR) 10 MG tablet Take by mouth.    Marland Kitchen omeprazole (PRILOSEC) 20 MG capsule Take 1 capsule by mouth two times daily 180 capsule 3  . valACYclovir (VALTREX) 500 MG tablet Take 1 tablet (500 mg total) by mouth 2 (two) times daily. 180 tablet 1  . loratadine (CLARITIN) 10 MG tablet Take 1 tablet (10 mg total) by mouth daily. (Patient not taking: Reported on 01/22/2017) 30  tablet 11   No current facility-administered medications for this visit.     Review of Systems:  GENERAL:  Feels "good".  No fevers or sweats.  Weight down 4 pounds. PERFORMANCE STATUS (ECOG):  1 HEENT:  Speech affected by lateness in the day and fatigue, improved.  No visual changes, runny nose, sore throat, mouth sores or tenderness. Lungs: No shortness of breath or cough.  No hemoptysis. Cardiac: Paroxysmal nocturnal dyspnea. No chest pain, palpitations, or orthopnea. GI:  No nausea, vomiting, diarrhea, constipation, melena or hematochezia. GU:  No urgency, frequency, dysuria, or hematuria.   Musculoskeletal:  Neck pain.  Bilateral shoulder pain.  No muscle tenderness. Extremities:  No pain or swelling. Skin:  Scalp healing slowly.  No rashes or skin changes. Neuro:  Herpetic neuralgia, improved.  No headache, numbness or weakness, balance or coordination issues. Endocrine:  No diabetes, thyroid issues, hot flashes or night sweats. Psych:  No mood changes, depression or anxiety. Pain:  No focal pain. Review of systems:  All other systems reviewed and found to be negative.  Physical Exam: Blood pressure 125/84, pulse 98, temperature 99.3 F (37.4 C), temperature source Tympanic, resp. rate 16, weight 215 lb 11.2 oz (97.8 kg). GENERAL:  Well developed, well nourished, woman sitting comfortably in the exam room in no acute distress.  MENTAL STATUS:  Alert and oriented to person, place and time. HEAD:   Short graying hair. Area of alopecia.  Minimal eschar.  Face symmetric.  No Cushingoid features. EYES:  Glasses.  Blue eyes.  Pupils equal round and reactive to light and accomodation.  No conjunctivitis or scleral icterus. ENT:  Speech minimally affected.  Tongue deviates slightly to the right (improved).  Oropharynx clear without lesions. Mucous membranes moist.   RESPIRATORY:  Clear to auscultation without rales, wheezes or rhonchi. CARDIOVASCULAR:  Regular rate and rhythm without murmur, rub or gallop. ABDOMEN:  Soft, non-tender, with active bowel sounds, and no hepatosplenomegaly.  No masses. SKIN: Scalp, improved.  No vesicles.  Left back rash. EXTREMITIES: No edema, no skin discoloration or tenderness.  No palpable cords. LYMPH NODES:  No palpable cervical, supraclavicular, axillary or inguinal adenopathy  NEUROLOGICAL: Unremarkable. PSYCH:  Appropriate.   No visits with results within 3 Day(s) from this visit.  Latest known visit with results is:  Office Visit on 09/26/2016  Component Date Value Ref Range Status  . WBC 09/26/2016 11.8* 3.4 - 10.8 x10E3/uL Final  . RBC  09/26/2016 4.03  3.77 - 5.28 x10E6/uL Final  . Hemoglobin 09/26/2016 11.0* 11.1 - 15.9 g/dL Final  . Hematocrit 09/26/2016 34.1  34.0 - 46.6 % Final  . MCV 09/26/2016 85  79 - 97 fL Final  . MCH 09/26/2016 27.3  26.6 - 33.0 pg Final  . MCHC 09/26/2016 32.3  31.5 - 35.7 g/dL Final  . RDW 09/26/2016 15.2  12.3 - 15.4 % Final  . Platelets 09/26/2016 267  150 - 379 x10E3/uL Final  . Neutrophils 09/26/2016 38  Not Estab. % Final  . Lymphs 09/26/2016 53  Not Estab. % Final  . Monocytes 09/26/2016 8  Not Estab. % Final  . Eos 09/26/2016 1  Not Estab. % Final  . Basos 09/26/2016 0  Not Estab. % Final  . Neutrophils Absolute 09/26/2016 4.5  1.4 - 7.0 x10E3/uL Final  . Lymphocytes Absolute 09/26/2016 6.2* 0.7 - 3.1 x10E3/uL Final  . Monocytes Absolute 09/26/2016 0.9  0.1 - 0.9 x10E3/uL Final  . EOS (ABSOLUTE) 09/26/2016 0.1  0.0 - 0.4  x10E3/uL Final  . Basophils Absolute 09/26/2016 0.0  0.0 - 0.2 x10E3/uL Final  . Immature Granulocytes 09/26/2016 0  Not Estab. % Final  . Immature Grans (Abs) 09/26/2016 0.0  0.0 - 0.1 x10E3/uL Final  . Hematology Comments: 09/26/2016 Note:   Final   Verified by microscopic examination.   [facility] Labs: 06/08/2015: hematocrit of 35.1, hemoglobin 11.7, MCV 81, platelets 225,000, WBC 17,200 with an ANC of 3100.  Absolutle lymphocyte count (ALC) 12,200.  Uric acid 8.0 (elevated; last check 7.7- elevated).  Creatinine 0.90.  LDH 217. 12/05/2015: hematocrit of 34.1, hemoglobin 10.9, MCV 81, platelets 213,000, WBC 17,700 with an ANC of 3100.  ALC 13,600.  Uric acid 6.0.  Creatinine 0.81.  LDH 212. 05/14/2016: hematocrit of 34.0, hemoglobin 11.1, MCV 83, platelets 382,000, WBC 13,800 with an ANC of 4600.  ALC 7,700.   06/11/2016: hematocrit of 36.0, hemoglobin 11.5, MCV 82, platelets 213,000, WBC 8,100 with an ANC of 3300.  ALC 3,900.  Creatinine 0.84.  Ferritin 79.  Iron saturation 12%.  Reticulocyte count 1.1%.  Coombs negative. 07/18/2016: hematocrit of 35.6, hemoglobin  11.8, MCV 83, platelets 200,000, WBC 6,900 with an ANC of 2600.  ALC 3,600.  Creatinine 0.78.  LFTs normal. 10/17/2016: hematocrit of 35.9, hemoglobin 11.8, MCV 84, platelets 248,000, WBC 10,500 with an ANC of 3200.  ALC 3,600.  Creatinine 0.93.  LFTs normal. 02/02/2017: hematocrit of 32.2, hemoglobin 10.4, MCV 83, platelets 361,000, WBC 15,700 with an ANC of 7200.  ALC 7,200.  Creatinine 0.84.  LFTs normal.  Albumen 3.5.   Assessment:  Joanna Phillips is a 66 y.o. female with stage I chronic lymphocytic leukemia diagnosed in 2011.  She was diagnosed with right breast DCIS in 02/2016.  CLL:  She presented with mild lymphocytosis and small cervical adenopathy on routine physical exam.  CBC revealed only lymphocytosis.  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  SPEP was normal.  Chest, abdomen, and pelvic CT scan revealed small diffuse nodes.  DCIS:  She underwent wire-guided right partial mastectomy on 03/11/2016 at [facility].  Pathology revealed a 3.9 cm grade III DCIS.  ER was strongly positive (Allred score 8) in 100% of cells.  PR was strongly positive (Allred score 6) in 30% of cells.  Pathologic stage was TisNx.  She completed breast radiation on 06/25/2016.    She began Arimidex on 06/26/2016.  She is tolerating treatment well.   Bilateral mammogram at [facility] on 12/19/2016 revealed no evidence of malignancy.   There was interval development of post lumpectomy changes.  She has a history of intermittent rectal and vaginal bleeding in 2015.  She was seen by gynecology at the [facility].  Pelvic exam and ultrasound revealed fibroids.   EGD on 11/11/2011 revealed a hiatal hernia and gastritis.  Colonoscopy on 11/11/2011 revealed diverticulosis in the ascending, descending, and sigmoid colon.  Stool guaiacs x 3 were negative in 06/2014.  She denies any  melena or hematochezia.   She has a history of varicella zoster involving the right side of her scalp.  She was admitted to [facility] from 12/21/2015 - 12/25/2015 with disseminated varicella zoster.  She was treated with IV acyclovir then switched to oral valacyclovir (completed 01/10/2016).  She is on prophylactic valacyclovir.  Scalp has undergone debridement at the wound care center.  Bone density study on 05/21/2016 was normal with a  T score of -0.3 in the AP spine L1-L4 and -0.3 in the left femoral neck.  Symptomatically, she denies any B symptoms.  She has chronic post herpetic neuralgia pain.  Exam reveals no adenopathy or hepatosplenomegaly.  WBC is 15,700 with [place] 7200. Hemoglobin is 10.4, hematocrit 32.2, and platelets 361,000.  Plan: 1.  Review labs from [place].  Discuss slight decline in hematocrit.  Discuss additional labs at next check. 2.  Continue Arimidex.  3.  Continue to follow up with orthopedics as previously scheduled.  Next appointment on 02/06/2017. 4.  [facility] slip (CBC with diff, CMP, ferritin, iron studies, B12, retic count) prior to next appointment. 5.  RTC in 3 months for MD assessment and review of [facility] labs.    Honor Loh, NP  02/05/2017, 3:21 PM   I saw and evaluated the patient, participating in the key portions of the service and reviewing pertinent diagnostic studies and records.  I reviewed the nurse practitioner's note and agree with the findings and the plan.  The assessment and plan were discussed with the patient.  A few questions were asked by the patient and answered.   Lequita Asal, MD 02/05/2017,8:56 PM

## 2017-02-05 NOTE — Progress Notes (Signed)
Patient is here today for a follow up. Patient states no new concerns today.  

## 2017-02-06 DIAGNOSIS — M4802 Spinal stenosis, cervical region: Secondary | ICD-10-CM | POA: Diagnosis not present

## 2017-02-06 DIAGNOSIS — M7502 Adhesive capsulitis of left shoulder: Secondary | ICD-10-CM | POA: Diagnosis not present

## 2017-02-07 ENCOUNTER — Encounter: Payer: Self-pay | Admitting: Hematology and Oncology

## 2017-02-13 ENCOUNTER — Encounter: Payer: Self-pay | Admitting: Hematology and Oncology

## 2017-02-18 DIAGNOSIS — M79602 Pain in left arm: Secondary | ICD-10-CM | POA: Diagnosis not present

## 2017-02-18 DIAGNOSIS — M542 Cervicalgia: Secondary | ICD-10-CM | POA: Diagnosis not present

## 2017-02-18 DIAGNOSIS — B0229 Other postherpetic nervous system involvement: Secondary | ICD-10-CM | POA: Diagnosis not present

## 2017-02-26 DIAGNOSIS — L508 Other urticaria: Secondary | ICD-10-CM | POA: Diagnosis not present

## 2017-02-26 DIAGNOSIS — B999 Unspecified infectious disease: Secondary | ICD-10-CM | POA: Diagnosis not present

## 2017-03-01 ENCOUNTER — Other Ambulatory Visit: Payer: Self-pay | Admitting: Nurse Practitioner

## 2017-03-01 DIAGNOSIS — B0229 Other postherpetic nervous system involvement: Secondary | ICD-10-CM

## 2017-03-02 DIAGNOSIS — M25512 Pain in left shoulder: Secondary | ICD-10-CM | POA: Diagnosis not present

## 2017-03-02 DIAGNOSIS — S0100XD Unspecified open wound of scalp, subsequent encounter: Secondary | ICD-10-CM | POA: Diagnosis not present

## 2017-03-02 DIAGNOSIS — M75102 Unspecified rotator cuff tear or rupture of left shoulder, not specified as traumatic: Secondary | ICD-10-CM | POA: Diagnosis not present

## 2017-03-02 DIAGNOSIS — B0229 Other postherpetic nervous system involvement: Secondary | ICD-10-CM | POA: Diagnosis not present

## 2017-03-02 DIAGNOSIS — C9190 Lymphoid leukemia, unspecified not having achieved remission: Secondary | ICD-10-CM | POA: Diagnosis not present

## 2017-03-03 ENCOUNTER — Other Ambulatory Visit: Payer: Self-pay | Admitting: Orthopedic Surgery

## 2017-03-03 DIAGNOSIS — M25512 Pain in left shoulder: Secondary | ICD-10-CM

## 2017-03-03 DIAGNOSIS — M75102 Unspecified rotator cuff tear or rupture of left shoulder, not specified as traumatic: Secondary | ICD-10-CM

## 2017-03-06 ENCOUNTER — Ambulatory Visit
Admission: RE | Admit: 2017-03-06 | Discharge: 2017-03-06 | Disposition: A | Discharge status: Home / Self Care | Payer: Medicare Other | Source: Ambulatory Visit | Attending: Orthopedic Surgery | Admitting: Orthopedic Surgery

## 2017-03-06 DIAGNOSIS — M25512 Pain in left shoulder: Secondary | ICD-10-CM

## 2017-03-06 DIAGNOSIS — S46011A Strain of muscle(s) and tendon(s) of the rotator cuff of right shoulder, initial encounter: Secondary | ICD-10-CM | POA: Diagnosis not present

## 2017-03-06 DIAGNOSIS — M65812 Other synovitis and tenosynovitis, left shoulder: Secondary | ICD-10-CM | POA: Diagnosis not present

## 2017-03-06 DIAGNOSIS — M75112 Incomplete rotator cuff tear or rupture of left shoulder, not specified as traumatic: Secondary | ICD-10-CM | POA: Insufficient documentation

## 2017-03-06 DIAGNOSIS — M75102 Unspecified rotator cuff tear or rupture of left shoulder, not specified as traumatic: Secondary | ICD-10-CM

## 2017-03-06 DIAGNOSIS — M25412 Effusion, left shoulder: Secondary | ICD-10-CM | POA: Diagnosis not present

## 2017-03-11 DIAGNOSIS — G5601 Carpal tunnel syndrome, right upper limb: Secondary | ICD-10-CM | POA: Diagnosis not present

## 2017-03-11 DIAGNOSIS — M75122 Complete rotator cuff tear or rupture of left shoulder, not specified as traumatic: Secondary | ICD-10-CM | POA: Diagnosis not present

## 2017-03-11 DIAGNOSIS — M25512 Pain in left shoulder: Secondary | ICD-10-CM | POA: Diagnosis not present

## 2017-03-18 DIAGNOSIS — Z23 Encounter for immunization: Secondary | ICD-10-CM | POA: Diagnosis not present

## 2017-03-23 ENCOUNTER — Other Ambulatory Visit: Payer: Self-pay

## 2017-05-01 ENCOUNTER — Ambulatory Visit (INDEPENDENT_AMBULATORY_CARE_PROVIDER_SITE_OTHER): Payer: Medicare Other | Admitting: Nurse Practitioner

## 2017-05-01 ENCOUNTER — Encounter: Payer: Self-pay | Admitting: Nurse Practitioner

## 2017-05-01 VITALS — BP 102/63 | HR 102 | Temp 98.3°F | Ht 65.0 in | Wt 208.8 lb

## 2017-05-01 DIAGNOSIS — J019 Acute sinusitis, unspecified: Secondary | ICD-10-CM | POA: Diagnosis not present

## 2017-05-01 DIAGNOSIS — D649 Anemia, unspecified: Secondary | ICD-10-CM | POA: Diagnosis not present

## 2017-05-01 DIAGNOSIS — B9689 Other specified bacterial agents as the cause of diseases classified elsewhere: Secondary | ICD-10-CM | POA: Diagnosis not present

## 2017-05-01 DIAGNOSIS — C9190 Lymphoid leukemia, unspecified not having achieved remission: Secondary | ICD-10-CM | POA: Diagnosis not present

## 2017-05-01 MED ORDER — BENZONATATE 100 MG PO CAPS: 100.0000 mg | ORAL_CAPSULE | Freq: Two times a day (BID) | ORAL | 0 refills | Status: DC | PRN

## 2017-05-01 MED ORDER — DOXYCYCLINE HYCLATE 100 MG PO TABS: 100.0000 mg | ORAL_TABLET | Freq: Two times a day (BID) | ORAL | 0 refills | Status: DC

## 2017-05-01 NOTE — Progress Notes (Signed)
Subjective:    Patient ID: Joanna Phillips, female    DOB: October 22, 1950, 66 y.o.   MRN: [id number]  Joanna Phillips is a 66 y.o. female presenting on 05/01/2017 for Cough (productive cough, nasal drainage, post nasal drainage, cough is more productive and night  x 3 weeks )   HPI Cough Patient presents today with cough that began about 3 weeks ago.  Patient reports frequent cough at night with productivity only during the night and early morning from postnasal drainage.  Associated symptoms include postnasal drainage, rhinorrhea, nasal congestion, sinus congestion, epistaxis yesterday.  Symptoms have persisted without significant worsening until yesterday. - no sore throat, ear pressure pain or fullness, jaw pain or tooth pain, fevers/sweats, but cold chills on Sunday.      Social History   Tobacco Use  . Smoking status: Never Smoker  . Smokeless tobacco: Never Used  Substance Use Topics  . Alcohol use: No  . Drug use: No    Review of Systems Per HPI unless specifically indicated above     Objective:    BP 102/63 (BP Location: Left Arm, Patient Position: Sitting, Cuff Size: Normal)   Pulse (!) 102   Temp 98.3 F (36.8 C) (Oral)   Ht 5\' 5"  (1.651 m)   Wt 208 lb 12.8 oz (94.7 kg)   BMI 34.75 kg/m   Wt Readings from Last 3 Encounters:  05/07/17 208 lb 4 oz (94.5 kg)  05/01/17 208 lb 12.8 oz (94.7 kg)  02/05/17 215 lb 11.2 oz (97.8 kg)    Physical Exam  Constitutional: She appears well-developed and well-nourished. No distress.  HENT:  Head: Normocephalic and atraumatic.  Right Ear: Hearing, tympanic membrane, external ear and ear canal normal.  Left Ear: Hearing, tympanic membrane, external ear and ear canal normal.  Nose: Mucosal edema and rhinorrhea present. Right sinus exhibits maxillary sinus tenderness and frontal sinus tenderness. Left sinus exhibits maxillary sinus tenderness and frontal sinus tenderness.  Mouth/Throat: Uvula is midline and mucous membranes are  normal. Oropharyngeal exudate (clear secretions) and posterior oropharyngeal edema (cobblestoning) present.  Neck: Normal range of motion. Neck supple.  Cardiovascular: Normal rate, regular rhythm, S1 normal, S2 normal and normal heart sounds.  Pulmonary/Chest: Effort normal and breath sounds normal. No respiratory distress.  Lymphadenopathy:    She has cervical adenopathy (mild anterior superficial).  Neurological: She is alert.  Skin: Skin is warm and dry.  Psychiatric: She has a normal mood and affect. Her behavior is normal.  Vitals reviewed.   Results for orders placed or performed in visit on 09/26/16  CBC with Differential/Platelet  Result Value Ref Range   WBC 11.8 (H) 3.4 - 10.8 x10E3/uL   RBC 4.03 3.77 - 5.28 x10E6/uL   Hemoglobin 11.0 (L) 11.1 - 15.9 g/dL   Hematocrit 34.1 34.0 - 46.6 %   MCV 85 79 - 97 fL   MCH 27.3 26.6 - 33.0 pg   MCHC 32.3 31.5 - 35.7 g/dL   RDW 15.2 12.3 - 15.4 %   Platelets 267 150 - 379 x10E3/uL   Neutrophils 38 Not Estab. %   Lymphs 53 Not Estab. %   Monocytes 8 Not Estab. %   Eos 1 Not Estab. %   Basos 0 Not Estab. %   Neutrophils Absolute 4.5 1.4 - 7.0 x10E3/uL   Lymphocytes Absolute 6.2 (H) 0.7 - 3.1 x10E3/uL   Monocytes Absolute 0.9 0.1 - 0.9 x10E3/uL   EOS (ABSOLUTE) 0.1 0.0 - 0.4 x10E3/uL  Basophils Absolute 0.0 0.0 - 0.2 x10E3/uL   Immature Granulocytes 0 Not Estab. %   Immature Grans (Abs) 0.0 0.0 - 0.1 x10E3/uL   Hematology Comments: Note:       Assessment & Plan:   Problem List Items Addressed This Visit    None    Visit Diagnoses    Acute bacterial rhinosinusitis    -  Primary Consistent with URI and secondary sinusitis with symptoms worsening over the past 7 days and initial symptoms of nasal congestion and sinus pressure over 2 weeks ago.   Plan: 1.START taking doxycycline 100 mg tablets every 12 hours for 10 days.  Discussed completing antibiotic. - While on antibiotic, take a probiotic OTC or from food. - Start  Atrovent nasal spray decongestant 2 sprays each nostril up to 4 times daily for 5-7 days - Start tessalon 100 mg bid prn cough. - Continue anti-histamine loratadine 10mg  daily. - Can use Flonase 2 sprays each nostril daily for up to 4-6 weeks if no epistaxis. - Start Mucinex-DM OTC for  7-10 days prn congestion 2. Supportive care with nasal saline, warm herbal tea with honey, 3. Improve hydration 4. Tylenol / Motrin PRN fevers  5. Return criteria given   Relevant Medications   Dextromethorphan-Guaifenesin (ROBITUSSIN DM) 10-100 MG/5ML liquid   doxycycline (VIBRA-TABS) 100 MG tablet   benzonatate (TESSALON) 100 MG capsule      Meds ordered this encounter  Medications  . doxycycline (VIBRA-TABS) 100 MG tablet    Sig: Take 1 tablet (100 mg total) by mouth 2 (two) times daily.    Dispense:  20 tablet    Refill:  0    Order Specific Question:   Supervising Provider    Answer:   Olin Hauser [[id number]]  . benzonatate (TESSALON) 100 MG capsule    Sig: Take 1 capsule (100 mg total) by mouth 2 (two) times daily as needed for cough.    Dispense:  20 capsule    Refill:  0    Order Specific Question:   Supervising Provider    Answer:   Olin Hauser [[id number]]    Follow up plan: Return 5-7 days if symptoms worsen or fail to improve.   Cassell Smiles, DNP, AGPCNP-BC Adult Gerontology Primary Care Nurse Practitioner [facility] 05/13/2017, 2:21 PM

## 2017-05-01 NOTE — Patient Instructions (Addendum)
Joanna Phillips, Thank you for coming in to clinic today.  1. It sounds like you have an Upper Respiratory Bacterial infection.  Recommend good hand washing. - START taking doxycycline 100 mg tablets.  Take 1 tablet every 12 hours for 10 days.  Make sure to take all doses of your antibiotic. - While you are on an antibiotic, take a probiotic.  Antibiotics kill good and bad bacteria.  A probiotic helps to replace your good bacteria. Probiotic pills can be found over the counter.  One brand is Florastor, but you can use any brand you prefer.  You can also get good bacteria from foods like yogurt.  - Continue anti-histamine cetirizine 10mg  daily. - You may also use Flonase 2 sprays each nostril daily for up to 4-6 weeks.  Stop using this if you have additional nose bleeds.  Other over the counter medications you may try, if needed for symptoms are: - If congestion is worse, start OTC Mucinex (or may try Mucinex-DM for cough) up to 7-10 days then stop - You may try over the counter Nasal Saline spray (Simply Saline, Ocean Spray) as needed to reduce congestion. - Start taking Tylenol extra strength 1 to 2 tablets every 6-8 hours for aches or fever/chills for next few days as needed.  Do not take more than 3,000 mg in 24 hours from all medicines.    If symptoms are significantly worse with persistent fevers/chills despite tylenol/ibpurofen, nausea, vomiting unable to tolerate food/fluids or medicine, body aches, or shortness of breath, sinus pain pressure or worsening productive cough, then follow-up for re-evaluation, may seek more immediate care at Urgent Care or the ED if you are more concerned that it is an emergency.  Please schedule a follow-up appointment with Cassell Smiles, AGNP. Return 5-7 days if symptoms worsen or fail to improve.  If you have any other questions or concerns, please feel free to call the clinic or send a message through [facility]. You may also schedule an earlier appointment if  necessary.  You will receive a survey after today's visit either digitally by e-mail or paper by [employer]. Your experiences and feedback matter to [place].  Please respond so we know how we are doing as we provide care for you.   Cassell Smiles, DNP, AGNP-BC Adult Gerontology Nurse Practitioner [place]

## 2017-05-07 ENCOUNTER — Inpatient Hospital Stay: Payer: Medicare Other | Attending: Hematology and Oncology | Admitting: Urgent Care

## 2017-05-07 VITALS — BP 132/79 | HR 106 | Temp 98.8°F | Resp 18 | Wt 208.2 lb

## 2017-05-07 DIAGNOSIS — J069 Acute upper respiratory infection, unspecified: Secondary | ICD-10-CM | POA: Diagnosis not present

## 2017-05-07 DIAGNOSIS — C9111 Chronic lymphocytic leukemia of B-cell type in remission: Secondary | ICD-10-CM | POA: Insufficient documentation

## 2017-05-07 DIAGNOSIS — D259 Leiomyoma of uterus, unspecified: Secondary | ICD-10-CM | POA: Insufficient documentation

## 2017-05-07 DIAGNOSIS — B0229 Other postherpetic nervous system involvement: Secondary | ICD-10-CM | POA: Insufficient documentation

## 2017-05-07 DIAGNOSIS — Z8719 Personal history of other diseases of the digestive system: Secondary | ICD-10-CM | POA: Insufficient documentation

## 2017-05-07 DIAGNOSIS — Z803 Family history of malignant neoplasm of breast: Secondary | ICD-10-CM | POA: Diagnosis not present

## 2017-05-07 DIAGNOSIS — R200 Anesthesia of skin: Secondary | ICD-10-CM | POA: Insufficient documentation

## 2017-05-07 DIAGNOSIS — C9110 Chronic lymphocytic leukemia of B-cell type not having achieved remission: Secondary | ICD-10-CM

## 2017-05-07 DIAGNOSIS — Z801 Family history of malignant neoplasm of trachea, bronchus and lung: Secondary | ICD-10-CM | POA: Insufficient documentation

## 2017-05-07 DIAGNOSIS — Z9049 Acquired absence of other specified parts of digestive tract: Secondary | ICD-10-CM | POA: Diagnosis not present

## 2017-05-07 DIAGNOSIS — R21 Rash and other nonspecific skin eruption: Secondary | ICD-10-CM | POA: Insufficient documentation

## 2017-05-07 DIAGNOSIS — K219 Gastro-esophageal reflux disease without esophagitis: Secondary | ICD-10-CM | POA: Insufficient documentation

## 2017-05-07 DIAGNOSIS — D0511 Intraductal carcinoma in situ of right breast: Secondary | ICD-10-CM | POA: Diagnosis not present

## 2017-05-07 DIAGNOSIS — Z79811 Long term (current) use of aromatase inhibitors: Secondary | ICD-10-CM | POA: Diagnosis not present

## 2017-05-07 DIAGNOSIS — M4802 Spinal stenosis, cervical region: Secondary | ICD-10-CM | POA: Diagnosis not present

## 2017-05-07 DIAGNOSIS — Z170 Estrogen receptor positive status [ER+]: Secondary | ICD-10-CM | POA: Insufficient documentation

## 2017-05-07 DIAGNOSIS — D649 Anemia, unspecified: Secondary | ICD-10-CM

## 2017-05-07 DIAGNOSIS — M75122 Complete rotator cuff tear or rupture of left shoulder, not specified as traumatic: Secondary | ICD-10-CM

## 2017-05-07 DIAGNOSIS — Z79899 Other long term (current) drug therapy: Secondary | ICD-10-CM | POA: Diagnosis not present

## 2017-05-07 NOTE — Progress Notes (Addendum)
[facility] day:  05/07/17  Chief Complaint: Joanna Phillips is a 66 y.o. female with chronic lymphocytic leukemia (CLL) and right breast DCIS who is seen for 3 month assessment on Arimidex.   HPI:  The patient was last seen in the medical oncology clinic on 02/05/2017.  At that time, he was doing well and verbalized no acute physical complaints. She did not interval infections. Patient complained of chronic post herpetic neuralgia. She maintained a iron rich diet; considering meat and green leafy vegetables on a regular basis. Exam was stable. Labs reviewed from 02/02/2017. WBC 15.7 with an [place] of 7200. Hemoglobin 10.4, hematocrit 32.2, MCV 83.0, MCH 26.7, and platelets 361,000.  Patient had MRI of her cervical spine on 01/28/2017. MRI noted multilevel (C3-4 and C4-5) mild left foraminal narrowing due to vertebral spurring. There was mild left and right foraminal stenosis at C5-6. Moderate foraminal at C6-7.  Mild right foraminal narrowing at C7-T1. There was a central disc protrusion partially effacing the ventral CSF at the level of T2-3.  MRI of the left shoulder was done on 03/06/2017 that revealed severe tendinosis of the supraspinatus tendon with a high-grade partial thickness articular surface tear with a possible small full thickness component anteriorly. There was moderate stenosis of the infraspinatus tendon. 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 There was mild tendinosis of the intrarticular portion of the long head of the biceps tendon.  Patient reports a fall that precipitated the previously mentioned MRI of the cervical spine and left shoulder. She she continues to participate in physical therapy activities twice a week, and has done so for the last month.  Patient was seen in follow-up consultation  03/11/2017 by orthopedics to review the aforementioned MRI results. Plans were for an ambulatory referral to physical therapy for treatment of a complete tear of the left rotator cuff. Patient with bilateral hand numbness that is intermittent, yet increasing in severity. There was concern of possible nerve compression from the cervical spine. Recommendations were for nerve conduction and EMG prior to consideration of surgical intervention. Patient verbalized that she was not ready to consider surgery at that time. Patient is scheduled for follow-up on 05/21/2017 for reevaluation following physical therapy, however she noticed that she is feeling better and she does not think that she is one to keep this appointment.  Patient was seen in consult by Dr. Manuella Ghazi on 03/11/2017. Nerve conduction study revealed no electrodiagnostic evidence of active C5 radiculopathy. Abnormal electrodiagnostic exam consistent with right minimal (grade 1) carpel tunnel syndome noted.   Symptomatically, patient has had a upper respiratory tract infection for the last 3-4 weeks. She finally presented to her primary care physician last week for treatment. Patient is currently on Tessalon when necessary, and a 10 day course of Doxycycline. Patient has 5 more days of antibiotics to complete.  Patient continues to complain of post herpetic neuralgia symptoms. She intermittently has pain to one localized spot on the right side of her cervical spine. She continues to have a nonspecific rash to her upper chest wall and upper extremities. Patient has been seen in consult by dermatology and advised that it was "her histamines having a party". Patient was prescribed topical interventions which are reported to "help some".   Patient continues on Arimidex as prescribed with no perceived side effects.  Patient has a birthday coming up  on [birth year]/25/2017, and is planning to retire from [employer] on 05/26/2017.  Past Medical History:  Diagnosis Date   . Arthritis   . CLL (chronic lymphocytic leukemia) ([place]) 2011  . DCIS (ductal carcinoma in situ) 03/11/2016  . GERD (gastroesophageal reflux disease)     Past Surgical History:  Procedure Laterality Date  . BREAST BIOPSY Right 02/12/2016   path pending  . CHOLECYSTECTOMY  1996  . COLONOSCOPY  2013    Family History  Problem Relation Age of Onset  . Cancer Father        lung  . Diabetes Mother   . Breast cancer Maternal Aunt     Social History:  reports that  has never smoked. she has never used smokeless tobacco. She reports that she does not drink alcohol or use drugs.  She lives in [place].The patient is alone today.  Allergies:  Allergies  Allergen Reactions  . Amoxicillin Hives  . Hydrocodone Itching  . Sulfamethoxazole-Trimethoprim Nausea Only  . Allopurinol Rash    Current Medications: Current Outpatient Medications  Medication Sig Dispense Refill  . amitriptyline (ELAVIL) 50 MG tablet TAKE 1 TABLET BY MOUTH AT BEDTIME. NEEDS APPOINTMENT. 90 tablet 1  . anastrozole (ARIMIDEX) 1 MG tablet Take 1 tablet (1 mg total) by mouth daily. 90 tablet 3  . benzonatate (TESSALON) 100 MG capsule Take 1 capsule (100 mg total) by mouth 2 (two) times daily as needed for cough. 20 capsule 0  . Biotin 1 MG CAPS Take by mouth.    . calcium-vitamin D (OSCAL WITH D) 500-200 MG-UNIT TABS tablet Take by mouth.    . cyclobenzaprine (FLEXERIL) 10 MG tablet Take by mouth.    . Dextromethorphan-Guaifenesin (ROBITUSSIN DM) 10-100 MG/5ML liquid Take 5 mLs by mouth every 12 (twelve) hours.    Marland Kitchen doxycycline (VIBRA-TABS) 100 MG tablet Take 1 tablet (100 mg total) by mouth 2 (two) times daily. 20 tablet 0  . fluticasone (FLONASE) 50 MCG/ACT nasal spray Place 2 sprays into both nostrils daily. 16 g 6  . gabapentin (NEURONTIN) 800 MG tablet Take 800 mg by mouth 3 (three) times daily.    Marland Kitchen ibuprofen (ADVIL,MOTRIN) 800 MG tablet Take by mouth.    . loratadine (CLARITIN) 10 MG tablet Take 1 tablet (10  mg total) by mouth daily. (Patient not taking: Reported on 05/01/2017) 30 tablet 11  . montelukast (SINGULAIR) 10 MG tablet Take by mouth.    Marland Kitchen omeprazole (PRILOSEC) 20 MG capsule Take 1 capsule by mouth two times daily 180 capsule 3  . valACYclovir (VALTREX) 500 MG tablet Take 1 tablet (500 mg total) by mouth 2 (two) times daily. 180 tablet 1   No current facility-administered medications for this visit.     Review of Systems:  GENERAL:  Feels "good".  No fevers or sweats.  Weight stable. PERFORMANCE STATUS (ECOG):  1 HEENT:  Speech affected by lateness in the day and fatigue, improved.  No visual changes, runny nose, sore throat, mouth sores or tenderness. Lungs: No shortness of breath or cough.  No hemoptysis. Cardiac: Paroxysmal nocturnal dyspnea. No chest pain, palpitations, or orthopnea. GI:  No nausea, vomiting, diarrhea, constipation, melena or hematochezia. GU:  No urgency, frequency, dysuria, or hematuria.  Musculoskeletal:  Neck pain.  Bilateral shoulder pain.  No muscle tenderness. Extremities:  No pain or swelling. Skin:  Non-specific rash to anterior chest wall and bilateral upper extremities; sees dermatology. Scalp healing slowly.  No skin changes. Neuro:  Herpetic neuralgia, improved.  No headache,  numbness or weakness, balance or coordination issues. Endocrine:  No diabetes, thyroid issues, hot flashes or night sweats. Psych:  No mood changes, depression or anxiety. Pain:  No focal pain. Review of systems:  All other systems reviewed and found to be negative.  Physical Exam: BP 132/79 (BP Location: Left Arm, Patient Position: Sitting)   Pulse (!) 106   Temp 98.8 F (37.1 C) (Tympanic)   Resp 18   Wt 208 lb 4 oz (94.5 kg)   BMI 34.65 kg/m  GENERAL:  Well developed, well nourished, woman sitting comfortably in the exam room in no acute distress.  MENTAL STATUS:  Alert and oriented to person, place and time. HEAD:   Short graying hair. Area of alopecia.  Minimal  eschar.  Face symmetric.  No Cushingoid features. EYES:  Glasses.  Blue eyes.  Pupils equal round and reactive to light and accomodation.  No conjunctivitis or scleral icterus. ENT:  Speech minimally affected.  Tongue deviates slightly to the right (improved).  Oropharynx clear without lesions. Mucous membranes moist.   RESPIRATORY:  Clear to auscultation without rales, wheezes or rhonchi. CARDIOVASCULAR:  Regular rate and rhythm without murmur, rub or gallop. ABDOMEN:  Soft, non-tender, with active bowel sounds, and no hepatosplenomegaly.  No masses. SKIN: Scalp, improved.  No vesicles.  Left back rash. EXTREMITIES: No edema, no skin discoloration or tenderness.  No palpable cords. LYMPH NODES:  No palpable cervical, supraclavicular, axillary or inguinal adenopathy  NEUROLOGICAL: Unremarkable. PSYCH:  Appropriate.   No visits with results within 3 Day(s) from this visit.  Latest known visit with results is:  Office Visit on 09/26/2016  Component Date Value Ref Range Status  . WBC 09/26/2016 11.8* 3.4 - 10.8 x10E3/uL Final  . RBC 09/26/2016 4.03  3.77 - 5.28 x10E6/uL Final  . Hemoglobin 09/26/2016 11.0* 11.1 - 15.9 g/dL Final  . Hematocrit 09/26/2016 34.1  34.0 - 46.6 % Final  . MCV 09/26/2016 85  79 - 97 fL Final  . MCH 09/26/2016 27.3  26.6 - 33.0 pg Final  . MCHC 09/26/2016 32.3  31.5 - 35.7 g/dL Final  . RDW 09/26/2016 15.2  12.3 - 15.4 % Final  . Platelets 09/26/2016 267  150 - 379 x10E3/uL Final  . Neutrophils 09/26/2016 38  Not Estab. % Final  . Lymphs 09/26/2016 53  Not Estab. % Final  . Monocytes 09/26/2016 8  Not Estab. % Final  . Eos 09/26/2016 1  Not Estab. % Final  . Basos 09/26/2016 0  Not Estab. % Final  . Neutrophils Absolute 09/26/2016 4.5  1.4 - 7.0 x10E3/uL Final  . Lymphocytes Absolute 09/26/2016 6.2* 0.7 - 3.1 x10E3/uL Final  . Monocytes Absolute 09/26/2016 0.9  0.1 - 0.9 x10E3/uL Final  . EOS (ABSOLUTE) 09/26/2016 0.1  0.0 - 0.4 x10E3/uL Final  . Basophils  Absolute 09/26/2016 0.0  0.0 - 0.2 x10E3/uL Final  . Immature Granulocytes 09/26/2016 0  Not Estab. % Final  . Immature Grans (Abs) 09/26/2016 0.0  0.0 - 0.1 x10E3/uL Final  . Hematology Comments: 09/26/2016 Note:   Final   Verified by microscopic examination.   [facility] Labs: 06/08/2015: hematocrit of 35.1, hemoglobin 11.7, MCV 81, platelets 225,000, WBC 17,200 with an ANC of 3100.  Absolutle lymphocyte count (ALC) 12,200.  Uric acid 8.0 (elevated; last check 7.7- elevated).  Creatinine 0.90.  LDH 217. 12/05/2015: hematocrit of 34.1, hemoglobin 10.9, MCV 81, platelets 213,000, WBC 17,700 with an ANC of 3100.  ALC 13,600.  Uric acid 6.0.  Creatinine 0.81.  LDH 212. 05/14/2016: hematocrit of 34.0, hemoglobin 11.1, MCV 83, platelets 382,000, WBC 13,800 with an ANC of 4600.  ALC 7,700.   06/11/2016: hematocrit of 36.0, hemoglobin 11.5, MCV 82, platelets 213,000, WBC 8,100 with an ANC of 3300.  ALC 3,900.  Creatinine 0.84.  Ferritin 79.  Iron saturation 12%.  Reticulocyte count 1.1%.  Coombs negative. 07/18/2016: hematocrit of 35.6, hemoglobin 11.8, MCV 83, platelets 200,000, WBC 6,900 with an ANC of 2600.  ALC 3,600.  Creatinine 0.78.  LFTs normal. 10/17/2016: hematocrit of 35.9, hemoglobin 11.8, MCV 84, platelets 248,000, WBC 10,500 with an ANC of 3200.  ALC 3,600.  Creatinine 0.93.  LFTs normal. 02/02/2017: hematocrit of 32.2, hemoglobin 10.4, MCV 83, platelets 361,000, WBC 15,700 with an ANC of 7200.  ALC 7,200.  Creatinine 0.84.  LFTs normal.  Albumin 3.5. 05/01/2017: hematocrit of 34.3, hemoglobin 11.1, MCV 79, platelets 281,000, WBC 9,200 with an ANC of 2900.  ALC 5,200.  Creatinine 0.87.  LFTs normal.  Albumin 3.5. B12 383. Folate 12.6, Ferritin 125. Reticulocytes 1.9%   Assessment:  Joanna Phillips is a 66 y.o. female with stage I chronic lymphocytic leukemia diagnosed in 2011.  She was diagnosed with right breast DCIS in 02/2016.  CLL:  She presented with mild lymphocytosis and small cervical  adenopathy on routine physical exam.  CBC revealed only lymphocytosis.  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  SPEP was normal.  Chest, abdomen, and pelvic CT scan revealed small diffuse nodes.  DCIS:  She underwent wire-guided right partial mastectomy on 03/11/2016 at [facility].  Pathology revealed a 3.9 cm grade III DCIS.  ER was strongly positive (Allred score 8) in 100% of cells.  PR was strongly positive (Allred score 6) in 30% of cells.  Pathologic stage was TisNx.  She completed breast radiation on 06/25/2016.    She began Arimidex on 06/26/2016.  She is tolerating treatment well.   Bilateral mammogram at [facility] on 12/19/2016 revealed no evidence of malignancy.   There was interval development of post lumpectomy changes.  She has a history of intermittent rectal and vaginal bleeding in 2015.  She was seen by gynecology at the [facility].  Pelvic exam and ultrasound revealed fibroids.   EGD on 11/11/2011 revealed a hiatal hernia and gastritis.  Colonoscopy on 11/11/2011 revealed diverticulosis in the ascending, descending, and sigmoid colon.  Stool guaiacs x 3 were negative in 06/2014.  She denies any melena or hematochezia.   She has a history of varicella zoster involving the right side of her scalp.  She was admitted to [facility] from 12/21/2015 - 12/25/2015 with disseminated varicella zoster.  She was treated with IV acyclovir then switched to oral valacyclovir (completed 01/10/2016).  She is on prophylactic valacyclovir.  Scalp has undergone debridement at the wound care center. She has chronic postherpetic neuralgia.  Bone density study on 05/21/2016 was normal with a T score of -0.3 in the AP spine L1-L4 and -0.3 in the left femoral neck.  Symptomatically, patient is being treated for an upper respiratory tract infection with Tessalon and  Doxycycline. He continues to have pain in her neck and left shoulder. She has a known left rotator cuff tear. Patient is currently in physical therapy twice a week, and has no plans to pursue surgical repair of her shoulder. Patient continues to have intermittent post herpetic  neuralgia pain to the right side of her neck. Exam is stable, and reveals no adenopathy or hepatosplenomegaly. WBC 9.2 with an [place] of 2900. Hemoglobin 11.1, hematocrit 34.3, MCV 79, MCH 25.7, and platelets 281,000.  Plan: 1.  Review labs from [place].  All labs are normal. Patient retiring in January and will be having lab work done in the cancer center from now on. 2.  Continue Arimidex as previously prescribed. 3.  Encouraged to follow up with orthopedics as previously scheduled.  4.  Continue prescribed course of Tessalon and Doxycycline for upper respiratory tract infection. Patient advised to complete course of antibiotics in its entirety. Encouraged to increase fluid intake. 5.  RTC in 3 months for MD assessment and labs (CBC with diff, CMP, ferritin).   Honor Loh, NP  05/07/2017, 10:42 AM

## 2017-05-07 NOTE — Progress Notes (Signed)
Patient has recently had URI and is currently on Doxycycline, robitussin DM and tessalon. Otherwise, no complaints today.

## 2017-05-13 ENCOUNTER — Encounter: Payer: Self-pay | Admitting: Hematology and Oncology

## 2017-05-13 ENCOUNTER — Encounter: Payer: Self-pay | Admitting: Nurse Practitioner

## 2017-05-20 ENCOUNTER — Encounter: Payer: Self-pay | Admitting: Hematology and Oncology

## 2017-05-21 DIAGNOSIS — M79602 Pain in left arm: Secondary | ICD-10-CM | POA: Diagnosis not present

## 2017-05-21 DIAGNOSIS — B0229 Other postherpetic nervous system involvement: Secondary | ICD-10-CM | POA: Diagnosis not present

## 2017-05-21 DIAGNOSIS — M542 Cervicalgia: Secondary | ICD-10-CM | POA: Diagnosis not present

## 2017-05-21 DIAGNOSIS — G5601 Carpal tunnel syndrome, right upper limb: Secondary | ICD-10-CM | POA: Diagnosis not present

## 2017-05-21 DIAGNOSIS — Z8673 Personal history of transient ischemic attack (TIA), and cerebral infarction without residual deficits: Secondary | ICD-10-CM | POA: Diagnosis not present

## 2017-07-30 ENCOUNTER — Other Ambulatory Visit: Payer: Self-pay | Admitting: *Deleted

## 2017-07-30 MED ORDER — ANASTROZOLE 1 MG PO TABS: 1.0000 mg | ORAL_TABLET | Freq: Every day | ORAL | 0 refills | Status: DC

## 2017-08-06 ENCOUNTER — Encounter: Payer: Self-pay | Admitting: Hematology and Oncology

## 2017-08-06 ENCOUNTER — Inpatient Hospital Stay: Payer: Medicare Other | Attending: Hematology and Oncology

## 2017-08-06 ENCOUNTER — Inpatient Hospital Stay (HOSPITAL_BASED_OUTPATIENT_CLINIC_OR_DEPARTMENT_OTHER): Payer: Medicare Other | Admitting: Hematology and Oncology

## 2017-08-06 ENCOUNTER — Other Ambulatory Visit: Payer: Self-pay | Admitting: Hematology and Oncology

## 2017-08-06 DIAGNOSIS — Z801 Family history of malignant neoplasm of trachea, bronchus and lung: Secondary | ICD-10-CM | POA: Insufficient documentation

## 2017-08-06 DIAGNOSIS — Z170 Estrogen receptor positive status [ER+]: Secondary | ICD-10-CM | POA: Diagnosis not present

## 2017-08-06 DIAGNOSIS — Z803 Family history of malignant neoplasm of breast: Secondary | ICD-10-CM | POA: Insufficient documentation

## 2017-08-06 DIAGNOSIS — B0229 Other postherpetic nervous system involvement: Secondary | ICD-10-CM | POA: Insufficient documentation

## 2017-08-06 DIAGNOSIS — Z923 Personal history of irradiation: Secondary | ICD-10-CM

## 2017-08-06 DIAGNOSIS — D0511 Intraductal carcinoma in situ of right breast: Secondary | ICD-10-CM

## 2017-08-06 DIAGNOSIS — D649 Anemia, unspecified: Secondary | ICD-10-CM

## 2017-08-06 DIAGNOSIS — C9110 Chronic lymphocytic leukemia of B-cell type not having achieved remission: Secondary | ICD-10-CM

## 2017-08-06 DIAGNOSIS — R779 Abnormality of plasma protein, unspecified: Secondary | ICD-10-CM

## 2017-08-06 DIAGNOSIS — M542 Cervicalgia: Secondary | ICD-10-CM | POA: Insufficient documentation

## 2017-08-06 DIAGNOSIS — M25512 Pain in left shoulder: Secondary | ICD-10-CM

## 2017-08-06 DIAGNOSIS — E8809 Other disorders of plasma-protein metabolism, not elsewhere classified: Secondary | ICD-10-CM

## 2017-08-06 DIAGNOSIS — Z79899 Other long term (current) drug therapy: Secondary | ICD-10-CM

## 2017-08-06 DIAGNOSIS — Z79811 Long term (current) use of aromatase inhibitors: Secondary | ICD-10-CM | POA: Insufficient documentation

## 2017-08-06 LAB — COMPREHENSIVE METABOLIC PANEL
ALK PHOS: 87 U/L (ref 38–126)
ALT: 18 U/L (ref 14–54)
AST: 26 U/L (ref 15–41)
Albumin: 3.7 g/dL (ref 3.5–5.0)
Anion gap: 4 — ABNORMAL LOW (ref 5–15)
BUN: 22 mg/dL — AB (ref 6–20)
CALCIUM: 8.4 mg/dL — AB (ref 8.9–10.3)
CO2: 26 mmol/L (ref 22–32)
CREATININE: 1 mg/dL (ref 0.44–1.00)
Chloride: 104 mmol/L (ref 101–111)
GFR, EST NON AFRICAN AMERICAN: 57 mL/min — AB (ref >60)
Glucose, Bld: 165 mg/dL — ABNORMAL HIGH (ref 65–99)
Potassium: 3.8 mmol/L (ref 3.5–5.1)
Sodium: 134 mmol/L — ABNORMAL LOW (ref 135–145)
Total Bilirubin: 0.5 mg/dL (ref 0.3–1.2)
Total Protein: 9 g/dL — ABNORMAL HIGH (ref 6.5–8.1)

## 2017-08-06 LAB — CBC WITH DIFFERENTIAL/PLATELET
Basophils Absolute: 0.1 10*3/uL (ref 0–0.1)
Basophils Relative: 1 %
EOS ABS: 0.1 10*3/uL (ref 0–0.7)
Eosinophils Relative: 1 %
HCT: 37.8 % (ref 35.0–47.0)
Hemoglobin: 12.5 g/dL (ref 12.0–16.0)
LYMPHS ABS: 7.7 10*3/uL — AB (ref 1.0–3.6)
Lymphocytes Relative: 70 %
MCH: 27.8 pg (ref 26.0–34.0)
MCHC: 33.2 g/dL (ref 32.0–36.0)
MCV: 83.7 fL (ref 80.0–100.0)
MONO ABS: 1 10*3/uL — AB (ref 0.2–0.9)
Monocytes Relative: 9 %
NEUTROS PCT: 19 %
Neutro Abs: 2.1 10*3/uL (ref 1.4–6.5)
PLATELETS: 223 10*3/uL (ref 150–440)
RBC: 4.51 MIL/uL (ref 3.80–5.20)
RDW: 17 % — AB (ref 11.5–14.5)
WBC: 11 10*3/uL (ref 3.6–11.0)

## 2017-08-06 LAB — IRON AND TIBC
Iron: 56 ug/dL (ref 28–170)
Saturation Ratios: 14 % (ref 10.4–31.8)
TIBC: 392 ug/dL (ref 250–450)
UIBC: 336 ug/dL

## 2017-08-06 LAB — FERRITIN: Ferritin: 48 ng/mL (ref 11–307)

## 2017-08-06 NOTE — Progress Notes (Signed)
[facility] day:  08/06/17  Chief Complaint: IFE VITELLI is a 67 y.o. female with chronic lymphocytic leukemia (CLL) and right breast DCIS who is seen for 3 month assessment.  HPI:  The patient was last seen in the medical oncology clinic on 05/07/2017 by Honor Loh, NP.  At that time, she was being treated for an upper respiratory tract infection.  She continued to have pain in her neck and left shoulder. She had a known left rotator cuff tear and was in physical therapy with no plans to pursue surgical repair of her shoulder.  She continued to have intermittent post herpetic neuralgia pain to the right side of her neck. Exam was stable, and revealed no adenopathy or hepatosplenomegaly. WBC was 9200 with an [place] of 2900. Hemoglobin was 11.1, hematocrit 34.3, MCV 79, MCH 25.7, and platelets 281,000.  She retired on 05/26/2017.  She stays up late and sleeps late.  During the interim, she notes that her left rotator cuff is great.  She can do anything.  She still has an area on her scalp that has not healed following shingles.  She has an appointment with dermatology in 08/2017.  She has had no infections.  She denies B symptoms.  She states "I feel better than I have in a long time".   Past Medical History:  Diagnosis Date  . Arthritis   . CLL (chronic lymphocytic leukemia) ([place]) 2011  . DCIS (ductal carcinoma in situ) 03/11/2016  . GERD (gastroesophageal reflux disease)     Past Surgical History:  Procedure Laterality Date  . BREAST BIOPSY Right 02/12/2016   path pending  . CHOLECYSTECTOMY  1996  . COLONOSCOPY  2013    Family History  Problem Relation Age of Onset  . Cancer Father        lung  . Diabetes Mother   . Breast cancer Maternal Aunt     Social History:  reports that she has never smoked. She has never used smokeless tobacco. She reports that she does not drink alcohol or use drugs.  She retired on 05/26/2017.  She lives in  [place].  The patient is alone today.  Allergies:  Allergies  Allergen Reactions  . Amoxicillin Hives  . Hydrocodone Itching  . Sulfamethoxazole-Trimethoprim Nausea Only  . Allopurinol Rash    Current Medications: Current Outpatient Medications  Medication Sig Dispense Refill  . amitriptyline (ELAVIL) 50 MG tablet TAKE 1 TABLET BY MOUTH AT BEDTIME. NEEDS APPOINTMENT. 90 tablet 1  . anastrozole (ARIMIDEX) 1 MG tablet Take 1 tablet (1 mg total) by mouth daily. 90 tablet 0  . Biotin 1 MG CAPS Take by mouth.    . calcium-vitamin D (OSCAL WITH D) 500-200 MG-UNIT TABS tablet Take by mouth.    . cyclobenzaprine (FLEXERIL) 10 MG tablet Take by mouth.    . fluticasone (FLONASE) 50 MCG/ACT nasal spray Place 2 sprays into both nostrils daily. 16 g 6  . gabapentin (NEURONTIN) 800 MG tablet Take 800 mg by mouth 3 (three) times daily.    Marland Kitchen omeprazole (PRILOSEC) 20 MG capsule Take 1 capsule by mouth two times daily 180 capsule 3  . valACYclovir (VALTREX) 500 MG tablet Take 1 tablet (500 mg total) by mouth 2 (two) times daily. 180 tablet 1  . ibuprofen (ADVIL,MOTRIN) 800 MG tablet Take by mouth.    . loratadine (CLARITIN) 10 MG tablet Take 1 tablet (10 mg total) by mouth daily. (Patient not taking: Reported  on 05/01/2017) 30 tablet 11  . montelukast (SINGULAIR) 10 MG tablet Take by mouth.     No current facility-administered medications for this visit.     Review of Systems:  GENERAL:  Feels "better than I have in a long time".  No fevers or sweats.  No new weight. PERFORMANCE STATUS (ECOG):  1 HEENT:  No visual changes, runny nose, sore throat, mouth sores or tenderness. Lungs: No shortness of breath or cough.  No hemoptysis. Cardiac: No chest pain, palpitations, or orthopnea. GI:  No nausea, vomiting, diarrhea, constipation, melena or hematochezia. GU:  No urgency, frequency, dysuria, or hematuria.  Musculoskeletal:  Neck pain.  Bilateral shoulder pain.  No muscle tenderness. Extremities:  No  pain or swelling. Skin:  Scalp healing slowly.  No rashes or skin changes. Neuro:  Herpetic neuralgia, improved.  No headache, numbness or weakness, balance or coordination issues. Endocrine:  No diabetes, thyroid issues, hot flashes or night sweats. Psych:  No mood changes, depression or anxiety. Pain:  No focal pain. Review of systems:  All other systems reviewed and found to be negative.  Physical Exam: There were no vitals taken for this visit. GENERAL:  Well developed, well nourished, woman sitting comfortably in the exam room in no acute distress.  MENTAL STATUS:  Alert and oriented to person, place and time. HEAD:   Wearing a black cap.  Gauze underneath.  Short graying hair. Area of alopecia.  Minimal eschar.  Face symmetric.  No Cushingoid features. EYES:  Glasses.  Blue eyes.  Pupils equal round and reactive to light and accomodation.  No conjunctivitis or scleral icterus. ENT:  Speech minimally affected.  Tongue deviates slightly to the right.  Oropharynx clear without lesions. Mucous membranes moist.   RESPIRATORY:  Clear to auscultation without rales, wheezes or rhonchi. CARDIOVASCULAR:  Regular rate and rhythm without murmur, rub or gallop. ABDOMEN:  Soft, non-tender, with active bowel sounds, and no hepatosplenomegaly.  No masses. SKIN: Scalp, improved.  No vesicles.  Left back rash. EXTREMITIES: No edema, no skin discoloration or tenderness.  No palpable cords. LYMPH NODES:  No palpable cervical, supraclavicular, axillary or inguinal adenopathy  NEUROLOGICAL: Unremarkable. PSYCH:  Appropriate.   Appointment on 08/06/2017  Component Date Value Ref Range Status  . Iron 08/06/2017 56  28 - 170 ug/dL Final  . TIBC 08/06/2017 392  250 - 450 ug/dL Final  . Saturation Ratios 08/06/2017 14  10.4 - 31.8 % Final  . UIBC 08/06/2017 336  ug/dL Final   Performed at [facility], [address], [place], [place] [address]  . Ferritin 08/06/2017 48  11 - 307 ng/mL Final    Performed at [facility], [address] [place], [place] [address]  . Sodium 08/06/2017 134* 135 - 145 mmol/L Final  . Potassium 08/06/2017 3.8  3.5 - 5.1 mmol/L Final  . Chloride 08/06/2017 104  101 - 111 mmol/L Final  . CO2 08/06/2017 26  22 - 32 mmol/L Final  . Glucose, Bld 08/06/2017 165* 65 - 99 mg/dL Final  . BUN 08/06/2017 22* 6 - 20 mg/dL Final  . Creatinine, Ser 08/06/2017 1.00  0.44 - 1.00 mg/dL Final  . Calcium 08/06/2017 8.4* 8.9 - 10.3 mg/dL Final  . Total Protein 08/06/2017 9.0* 6.5 - 8.1 g/dL Final  . Albumin 08/06/2017 3.7  3.5 - 5.0 g/dL Final  . AST 08/06/2017 26  15 - 41 U/L Final  . ALT 08/06/2017 18  14 - 54 U/L Final  .  Alkaline Phosphatase 08/06/2017 87  38 - 126 U/L Final  . Total Bilirubin 08/06/2017 0.5  0.3 - 1.2 mg/dL Final  . GFR calc non Af Amer 08/06/2017 57* >60 mL/min Final  . GFR calc Af Amer 08/06/2017 >60  >60 mL/min Final   Comment: (NOTE) The eGFR has been calculated using the CKD EPI equation. This calculation has not been validated in all clinical situations. eGFR's persistently <60 mL/min signify possible Chronic Kidney Disease.   Georgiann Hahn gap 08/06/2017 4* 5 - 15 Final   Performed at [facility], [address] [place], [place] [address]  . WBC 08/06/2017 11.0  3.6 - 11.0 K/uL Final  . RBC 08/06/2017 4.51  3.80 - 5.20 MIL/uL Final  . Hemoglobin 08/06/2017 12.5  12.0 - 16.0 g/dL Final  . HCT 08/06/2017 37.8  35.0 - 47.0 % Final  . MCV 08/06/2017 83.7  80.0 - 100.0 fL Final  . MCH 08/06/2017 27.8  26.0 - 34.0 pg Final  . MCHC 08/06/2017 33.2  32.0 - 36.0 g/dL Final  . RDW 08/06/2017 17.0* 11.5 - 14.5 % Final  . Platelets 08/06/2017 223  150 - 440 K/uL Final  . Neutrophils Relative % 08/06/2017 19  % Final  . Lymphocytes Relative 08/06/2017 70  % Final  . Monocytes Relative 08/06/2017 9  % Final  . Eosinophils Relative 08/06/2017 1  % Final  . Basophils Relative 08/06/2017 1  % Final  . Neutro Abs 08/06/2017 2.1  1.4 -  6.5 K/uL Final  . Lymphs Abs 08/06/2017 7.7* 1.0 - 3.6 K/uL Final  . Monocytes Absolute 08/06/2017 1.0* 0.2 - 0.9 K/uL Final  . Eosinophils Absolute 08/06/2017 0.1  0 - 0.7 K/uL Final  . Basophils Absolute 08/06/2017 0.1  0 - 0.1 K/uL Final  . Smear Review 08/06/2017 SMEAR SCANNED    Final   Performed at [facility], [address], [place], [place] [address]  . Kappa free light chain 08/06/2017 121.2* 3.3 - 19.4 mg/L Final  . Lamda free light chains 08/06/2017 15.0  5.7 - 26.3 mg/L Final  . Kappa, lamda light chain ratio 08/06/2017 8.08* 0.26 - 1.65 Final   Comment: (NOTE) Performed At: [facility] [place], [place] [id number] Rush Farmer MD [id number] Performed at [facility], [address] [place], [place] [address]   . IgG (Immunoglobin G), Serum 08/06/2017 4,157* 700 - 1,600 mg/dL Final  . IgA 08/06/2017 <5* 87 - 352 mg/dL Final   Result confirmed on concentration.  . IgM (Immunoglobulin M), Srm 08/06/2017 24* 26 - 217 mg/dL Final   Result confirmed on concentration.  . Total Protein ELP 08/06/2017 9.0* 6.0 - 8.5 g/dL Corrected  . Albumin SerPl Elph-Mcnc 08/06/2017 3.5  2.9 - 4.4 g/dL Corrected  . Alpha 1 08/06/2017 0.3  0.0 - 0.4 g/dL Corrected  . Alpha2 Glob SerPl Elph-Mcnc 08/06/2017 0.7  0.4 - 1.0 g/dL Corrected  . B-Globulin SerPl Elph-Mcnc 08/06/2017 1.1  0.7 - 1.3 g/dL Corrected  . Gamma Glob SerPl Elph-Mcnc 08/06/2017 3.5* 0.4 - 1.8 g/dL Corrected  . M Protein SerPl Elph-Mcnc 08/06/2017 Not Observed  Not Observed g/dL Corrected  . Globulin, Total 08/06/2017 5.5* 2.2 - 3.9 g/dL Corrected  . Albumin/Glob SerPl 08/06/2017 0.7  0.7 - 1.7 Corrected  . IFE 1 08/06/2017 Comment   Corrected   Comment: (NOTE) An apparent polyclonal gammopathy: IgG. Kappa and lambda typing appear increased.   . Please Note 08/06/2017 Comment   Corrected  Comment: (NOTE) Protein electrophoresis scan will follow via computer, mail, or courier  delivery. Performed At: [facility] [place], [place] [id number] Rush Farmer MD MV:[id number] Performed at [facility], [address] [place], [place] [address]    [facility] Labs: 06/08/2015: hematocrit of 35.1, hemoglobin 11.7, MCV 81, platelets 225,000, WBC 17,200 with an ANC of 3100.  Absolutle lymphocyte count (ALC) 12,200.  Uric acid 8.0 (elevated; last check 7.7- elevated).  Creatinine 0.90.  LDH 217. 12/05/2015: hematocrit of 34.1, hemoglobin 10.9, MCV 81, platelets 213,000, WBC 17,700 with an ANC of 3100.  ALC 13,600.  Uric acid 6.0.  Creatinine 0.81.  LDH 212. 05/14/2016: hematocrit of 34.0, hemoglobin 11.1, MCV 83, platelets 382,000, WBC 13,800 with an ANC of 4600.  ALC 7,700.   06/11/2016: hematocrit of 36.0, hemoglobin 11.5, MCV 82, platelets 213,000, WBC 8,100 with an ANC of 3300.  ALC 3,900.  Creatinine 0.84.  Ferritin 79.  Iron saturation 12%.  Reticulocyte count 1.1%.  Coombs negative. 07/18/2016: hematocrit of 35.6, hemoglobin 11.8, MCV 83, platelets 200,000, WBC 6,900 with an ANC of 2600.  ALC 3,600.  Creatinine 0.78.  LFTs normal. 10/17/2016: hematocrit of 35.9, hemoglobin 11.8, MCV 84, platelets 248,000, WBC 10,500 with an ANC of 3200.  ALC 3,600.  Creatinine 0.93.  LFTs normal. 02/02/2017: hematocrit of 32.2, hemoglobin 10.4, MCV 83, platelets 361,000, WBC 15,700 with an ANC of 7200.  ALC 7,200.  Creatinine 0.84.  LFTs normal.  Albumen 3.5. 05/01/2017: hematocrit of 34.3, hemoglobin 11.1, MCV 79, platelets 281,000, WBC 9,200 with an ANC of 2900.  ALC 5,200.  Creatinine 0.87.  LFTs normal.  Albumin 3.5. B12 was 383. Folate 12.6, Ferritin 125. Reticulocyte count 1.9%.   Assessment:  MEGHANN LANDING is a 67 y.o. female with stage I chronic lymphocytic leukemia diagnosed in 2011.  She was diagnosed with right breast DCIS in 02/2016.  CLL:  She presented with mild lymphocytosis and small cervical adenopathy on routine physical exam.  CBC revealed  only lymphocytosis.  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  SPEP was normal.  Chest, abdomen, and pelvic CT scan revealed small diffuse nodes.  DCIS:  She underwent wire-guided right partial mastectomy on 03/11/2016 at [facility].  Pathology revealed a 3.9 cm grade III DCIS.  ER was strongly positive (Allred score 8) in 100% of cells.  PR was strongly positive (Allred score 6) in 30% of cells.  Pathologic stage was TisNx.  She completed breast radiation on 06/25/2016.    She began Arimidex on 06/26/2016.  She is tolerating treatment well.   Bilateral mammogram at [facility] on 12/19/2016 revealed no evidence of malignancy.   There was interval development of post lumpectomy changes.  She has a history of intermittent rectal and vaginal bleeding in 2015.  She was seen by gynecology at the [facility].  Pelvic exam and ultrasound revealed fibroids.   EGD on 11/11/2011 revealed a hiatal hernia and gastritis.  Colonoscopy on 11/11/2011 revealed diverticulosis in the ascending, descending, and sigmoid colon.  Stool guaiacs x 3 were negative in 06/2014.  She denies any melena or hematochezia.   She has a history of varicella zoster involving the right side of her scalp.  She was admitted to [facility] from 12/21/2015 - 12/25/2015 with disseminated varicella zoster.  She was treated with IV acyclovir then switched to oral valacyclovir (completed  01/10/2016).  She is on prophylactic valacyclovir.  Scalp has undergone debridement at the wound care center.  Bone density study on 05/21/2016 was normal with a T score of -0.3 in the AP spine L1-L4 and -0.3 in the left femoral neck.  Symptomatically, she denies any B symptoms.  She has chronic post herpetic neuralgia pain.  Exam reveals no adenopathy or hepatosplenomegaly.  WBC is 11,000 with [place] 2100. Hemoglobin is 12.5 hematocrit  37.8, and platelets 223,000.  Serum protein is elevated.  Plan: 1.  Labs:  CBC with diff, CMP, ferritin, iron studies, SPEP, FLCA. 2.  24 hour urine for UPEP and free light chains. 3.  Discuss additional labs to evaluate elevated protein. 4.  Continue Arimidex. 5.  RTC in 3 months for MD assessment with breast exam and labs (CBC with diff, CMP, LDH, uric acid).   Lequita Asal, MD  08/06/2017, 5:35 PM

## 2017-08-07 LAB — KAPPA/LAMBDA LIGHT CHAINS
Kappa free light chain: 121.2 mg/L — ABNORMAL HIGH (ref 3.3–19.4)
Kappa, lambda light chain ratio: 8.08 — ABNORMAL HIGH (ref 0.26–1.65)
Lambda free light chains: 15 mg/L (ref 5.7–26.3)

## 2017-08-10 DIAGNOSIS — D0511 Intraductal carcinoma in situ of right breast: Secondary | ICD-10-CM | POA: Diagnosis not present

## 2017-08-10 DIAGNOSIS — Z170 Estrogen receptor positive status [ER+]: Secondary | ICD-10-CM | POA: Diagnosis not present

## 2017-08-10 DIAGNOSIS — M25512 Pain in left shoulder: Secondary | ICD-10-CM | POA: Diagnosis not present

## 2017-08-10 DIAGNOSIS — M542 Cervicalgia: Secondary | ICD-10-CM | POA: Diagnosis not present

## 2017-08-10 DIAGNOSIS — C9110 Chronic lymphocytic leukemia of B-cell type not having achieved remission: Secondary | ICD-10-CM | POA: Diagnosis not present

## 2017-08-10 DIAGNOSIS — Z79811 Long term (current) use of aromatase inhibitors: Secondary | ICD-10-CM | POA: Diagnosis not present

## 2017-08-10 LAB — MULTIPLE MYELOMA PANEL, SERUM
Albumin SerPl Elph-Mcnc: 3.5 g/dL (ref 2.9–4.4)
Albumin/Glob SerPl: 0.7 (ref 0.7–1.7)
Alpha 1: 0.3 g/dL (ref 0.0–0.4)
Alpha2 Glob SerPl Elph-Mcnc: 0.7 g/dL (ref 0.4–1.0)
B-Globulin SerPl Elph-Mcnc: 1.1 g/dL (ref 0.7–1.3)
Gamma Glob SerPl Elph-Mcnc: 3.5 g/dL — ABNORMAL HIGH (ref 0.4–1.8)
Globulin, Total: 5.5 g/dL — ABNORMAL HIGH (ref 2.2–3.9)
IgA: <5 mg/dL — ABNORMAL LOW (ref 87–352)
IgG (Immunoglobin G), Serum: 4157 mg/dL — ABNORMAL HIGH (ref 700–1600)
IgM (Immunoglobulin M), Srm: 24 mg/dL — ABNORMAL LOW (ref 26–217)
Total Protein ELP: 9 g/dL — ABNORMAL HIGH (ref 6.0–8.5)

## 2017-08-11 ENCOUNTER — Other Ambulatory Visit: Payer: Self-pay | Admitting: *Deleted

## 2017-08-11 DIAGNOSIS — R779 Abnormality of plasma protein, unspecified: Secondary | ICD-10-CM

## 2017-08-11 DIAGNOSIS — E8809 Other disorders of plasma-protein metabolism, not elsewhere classified: Secondary | ICD-10-CM

## 2017-08-13 LAB — IFE+PROTEIN ELECTRO, 24-HR UR
% BETA, Urine: 0 %
ALPHA 1 URINE: 0 %
Albumin, U: 0 %
Alpha 2, Urine: 0 %
GAMMA GLOBULIN URINE: 0 %
Total Protein, Urine-Ur/day: 89 mg/24 hr (ref 30–150)
Total Protein, Urine: 6.6 mg/dL
Total Volume: 1350

## 2017-08-16 ENCOUNTER — Encounter: Payer: Self-pay | Admitting: Hematology and Oncology

## 2017-08-26 ENCOUNTER — Other Ambulatory Visit: Payer: Self-pay | Admitting: Nurse Practitioner

## 2017-08-26 DIAGNOSIS — B0229 Other postherpetic nervous system involvement: Secondary | ICD-10-CM

## 2017-08-31 DIAGNOSIS — L570 Actinic keratosis: Secondary | ICD-10-CM | POA: Diagnosis not present

## 2017-08-31 DIAGNOSIS — B029 Zoster without complications: Secondary | ICD-10-CM | POA: Diagnosis not present

## 2017-10-02 ENCOUNTER — Ambulatory Visit (INDEPENDENT_AMBULATORY_CARE_PROVIDER_SITE_OTHER): Payer: Medicare Other | Admitting: Nurse Practitioner

## 2017-10-02 ENCOUNTER — Other Ambulatory Visit: Payer: Self-pay

## 2017-10-02 ENCOUNTER — Encounter: Payer: Self-pay | Admitting: Nurse Practitioner

## 2017-10-02 DIAGNOSIS — B0229 Other postherpetic nervous system involvement: Secondary | ICD-10-CM

## 2017-10-02 DIAGNOSIS — B027 Disseminated zoster: Secondary | ICD-10-CM | POA: Diagnosis not present

## 2017-10-02 DIAGNOSIS — K219 Gastro-esophageal reflux disease without esophagitis: Secondary | ICD-10-CM

## 2017-10-02 MED ORDER — AMITRIPTYLINE HCL 50 MG PO TABS: 50.0000 mg | ORAL_TABLET | Freq: Every day | ORAL | 3 refills | Status: DC

## 2017-10-02 MED ORDER — OMEPRAZOLE 20 MG PO CPDR: DELAYED_RELEASE_CAPSULE | ORAL | 3 refills | Status: DC

## 2017-10-02 MED ORDER — VALACYCLOVIR HCL 500 MG PO TABS: 500.0000 mg | ORAL_TABLET | Freq: Two times a day (BID) | ORAL | 1 refills | Status: DC

## 2017-10-02 NOTE — Progress Notes (Signed)
Subjective:    Patient ID: Joanna Phillips, female    DOB: [birth year], 67 y.o.   MRN: [id number]  Joanna Phillips is a 67 y.o. female presenting on 10/02/2017 for Pain (post herpetic neuralgia, due to Shingles )   HPI Post herpetic neuralgia: - Amitriptyline: takes nightly Still has daily cervicalgia that is well controlled with amitriptyline.  Right cheek and jawline are significantly tender to touch and are impacted most to patient with putting on make-up.  Scalp lesions are slowly healing with the aid of Eucrisa tape.  Patient is being followed by Dr. Phillip Heal dermatology in [place]. -Patient has been followed by Dr. Ola Spurr.  She was continued on suppressive Valtrex related to the nature of her shingles and area affected.  We will continue tamitriptyline and Valtrex.  GERD -she has taken omeprazole 20 mg once daily.  Rarely has breakthrough heartburn, but occasionally takes 2 pills daily.  Is not currently using any other antacid for breakthrough heartburn control.  CKD -stable on last check 2 months ago with Dr. Mike Gip.  Patient denies any new symptoms.  Social History   Tobacco Use  . Smoking status: Never Smoker  . Smokeless tobacco: Never Used  Substance Use Topics  . Alcohol use: No  . Drug use: No    Review of Systems Per HPI unless specifically indicated above     Objective:    BP (!) 110/59 (BP Location: Right Arm, Patient Position: Sitting, Cuff Size: Large)   Pulse 88   Temp 97.8 F (36.6 C) (Oral)   Ht 5\' 5"  (1.651 m)   Wt 218 lb 3.2 oz (99 kg)   BMI 36.31 kg/m   Wt Readings from Last 3 Encounters:  10/02/17 218 lb 3.2 oz (99 kg)  05/07/17 208 lb 4 oz (94.5 kg)  05/01/17 208 lb 12.8 oz (94.7 kg)    Physical Exam  Constitutional: She is oriented to person, place, and time. She appears well-developed and well-nourished. No distress.  HENT:  Head: Normocephalic and atraumatic.  Neck: Normal range of motion. Neck supple. Carotid bruit is not present.    Cardiovascular: Normal rate, regular rhythm, S1 normal, S2 normal, normal heart sounds and intact distal pulses.  Pulmonary/Chest: Effort normal and breath sounds normal. No respiratory distress.  Musculoskeletal: She exhibits no edema (pedal).  Neurological: She is alert and oriented to person, place, and time.  CN X - hypoglossal - RIGHT leaning deviation.  CN all others normal.  Skin: Skin is warm and dry.  Scalp well healed, alopecia at area of R temporal bone where skin is scarred  Psychiatric: She has a normal mood and affect. Her behavior is normal.  Vitals reviewed.   Results for orders placed or performed in visit on 08/11/17  IFE+PROTEIN ELECTRO, 24-HR UR  Result Value Ref Range   Total Protein, Urine 6.6 Not Estab. mg/dL   Total Protein, Urine-Ur/day 89 30 - 150 mg/24 hr   Albumin, U 0.0 %   ALPHA 1 URINE 0.0 %   Alpha 2, Urine 0.0 %   % BETA, Urine 0.0 %   GAMMA GLOBULIN URINE 0.0 %   M-SPIKE %, Urine Not Observed Not Observed %   Immunofixation Result, Urine Comment    Note: Comment    Total Volume 1,350       Assessment & Plan:   Problem List Items Addressed This Visit      Digestive   GERD (gastroesophageal reflux disease) Stable on medication.  Patient occasionally  has breakthrough symptoms.  Discussed long term risks.  Patient desires to continue PPI.  Refill provided.  Take calcium supplement daily.  Follow-up 1 year.   Relevant Medications   omeprazole (PRILOSEC) 20 MG capsule     Nervous and Auditory   Post herpetic neuralgia Stable symptoms with nearly resolved eruptions of zoster lesions.  Ongoing pain over R temporal region, ear.  Continues on suppressive valtrex therapy to reduce risk of recurrence. - Continue amitriptyline 50 mg at bedtime without change - Continue valacyclovir without change - follow-up prn and in 1 year   Relevant Medications   amitriptyline (ELAVIL) 50 MG tablet   valACYclovir (VALTREX) 500 MG tablet    Other Visit  Diagnoses    Disseminated zoster       Relevant Medications   valACYclovir (VALTREX) 500 MG tablet      Meds ordered this encounter  Medications  . amitriptyline (ELAVIL) 50 MG tablet    Sig: Take 1 tablet (50 mg total) by mouth at bedtime.    Dispense:  90 tablet    Refill:  3  . omeprazole (PRILOSEC) 20 MG capsule    Sig: Take 1 capsule by mouth once daily    Dispense:  90 capsule    Refill:  3    Order Specific Question:   Supervising Provider    Answer:   Olin Hauser [[id number]]  . valACYclovir (VALTREX) 500 MG tablet    Sig: Take 1 tablet (500 mg total) by mouth 2 (two) times daily.    Dispense:  180 tablet    Refill:  1    Order Specific Question:   Supervising Provider    Answer:   Olin Hauser [[id number]]    Follow up plan: Return in about 1 year (around 10/03/2018) for post herpetic neuralgia.  Cassell Smiles, DNP, AGPCNP-BC Adult Gerontology Primary Care Nurse Practitioner [facility] 10/02/2017, 9:17 AM

## 2017-10-02 NOTE — Patient Instructions (Addendum)
Joanna Phillips,   Thank you for coming in to clinic today.  1. You can take calcium to prevent bone loss.  You will only need [id number] mg supplementation daily because you get calcium through your diet. - START taking calcium 500-600 mg with vitamin D supplement 1-2 times daily with meals.  If you are eating dairy, only take the supplement one time daily at your meals with least dairy consumption.  Your body only absorbs 500-600 mg each time you take calcium, so make sure you separate your doses.  2. Continue omeprazole and amitriptyline without changes.  Please schedule a follow-up appointment with Cassell Smiles, AGNP. Return in about 1 year (around 10/03/2018) for post herpetic neuralgia.  If you have any other questions or concerns, please feel free to call the clinic or send a message through [place]. You may also schedule an earlier appointment if necessary.  You will receive a survey after today's visit either digitally by e-mail or paper by [employer]. Your experiences and feedback matter to [place].  Please respond so we know how we are doing as we provide care for you.   Cassell Smiles, DNP, AGNP-BC Adult Gerontology Nurse Practitioner [place]

## 2017-10-05 DIAGNOSIS — Z23 Encounter for immunization: Secondary | ICD-10-CM | POA: Diagnosis not present

## 2017-11-05 ENCOUNTER — Telehealth: Payer: Self-pay | Admitting: *Deleted

## 2017-11-05 ENCOUNTER — Inpatient Hospital Stay: Payer: Medicare Other | Attending: Hematology and Oncology

## 2017-11-05 ENCOUNTER — Encounter: Payer: Self-pay | Admitting: Hematology and Oncology

## 2017-11-05 ENCOUNTER — Inpatient Hospital Stay (HOSPITAL_BASED_OUTPATIENT_CLINIC_OR_DEPARTMENT_OTHER): Payer: Medicare Other | Admitting: Hematology and Oncology

## 2017-11-05 VITALS — BP 103/75 | HR 80 | Temp 99.3°F | Resp 18 | Wt 213.0 lb

## 2017-11-05 DIAGNOSIS — D0511 Intraductal carcinoma in situ of right breast: Secondary | ICD-10-CM | POA: Diagnosis not present

## 2017-11-05 DIAGNOSIS — C9110 Chronic lymphocytic leukemia of B-cell type not having achieved remission: Secondary | ICD-10-CM

## 2017-11-05 DIAGNOSIS — R21 Rash and other nonspecific skin eruption: Secondary | ICD-10-CM | POA: Insufficient documentation

## 2017-11-05 DIAGNOSIS — B0229 Other postherpetic nervous system involvement: Secondary | ICD-10-CM | POA: Diagnosis not present

## 2017-11-05 DIAGNOSIS — Z79899 Other long term (current) drug therapy: Secondary | ICD-10-CM | POA: Insufficient documentation

## 2017-11-05 DIAGNOSIS — Z79811 Long term (current) use of aromatase inhibitors: Secondary | ICD-10-CM | POA: Insufficient documentation

## 2017-11-05 DIAGNOSIS — Z170 Estrogen receptor positive status [ER+]: Secondary | ICD-10-CM | POA: Diagnosis not present

## 2017-11-05 LAB — CBC WITH DIFFERENTIAL/PLATELET
Basophils Absolute: 0.1 10*3/uL (ref 0–0.1)
Basophils Relative: 1 %
Eosinophils Absolute: 0.1 10*3/uL (ref 0–0.7)
Eosinophils Relative: 1 %
HCT: 35.7 % (ref 35.0–47.0)
Hemoglobin: 12 g/dL (ref 12.0–16.0)
Lymphocytes Relative: 69 %
Lymphs Abs: 7.8 10*3/uL — ABNORMAL HIGH (ref 1.0–3.6)
MCH: 28.4 pg (ref 26.0–34.0)
MCHC: 33.8 g/dL (ref 32.0–36.0)
MCV: 84.2 fL (ref 80.0–100.0)
Monocytes Absolute: 0.9 10*3/uL (ref 0.2–0.9)
Monocytes Relative: 8 %
Neutro Abs: 2.4 10*3/uL (ref 1.4–6.5)
Neutrophils Relative %: 21 %
Platelets: 197 10*3/uL (ref 150–440)
RBC: 4.23 MIL/uL (ref 3.80–5.20)
RDW: 15.5 % — ABNORMAL HIGH (ref 11.5–14.5)
WBC: 11.3 10*3/uL — ABNORMAL HIGH (ref 3.6–11.0)

## 2017-11-05 LAB — COMPREHENSIVE METABOLIC PANEL
ALT: 25 U/L (ref 14–54)
AST: 33 U/L (ref 15–41)
Albumin: 3.8 g/dL (ref 3.5–5.0)
Alkaline Phosphatase: 68 U/L (ref 38–126)
Anion gap: 5 (ref 5–15)
BUN: 21 mg/dL — ABNORMAL HIGH (ref 6–20)
CO2: 24 mmol/L (ref 22–32)
Calcium: 8.8 mg/dL — ABNORMAL LOW (ref 8.9–10.3)
Chloride: 107 mmol/L (ref 101–111)
Creatinine, Ser: 0.97 mg/dL (ref 0.44–1.00)
GFR calc Af Amer: >60 mL/min (ref >60)
GFR calc non Af Amer: 59 mL/min — ABNORMAL LOW (ref >60)
Glucose, Bld: 125 mg/dL — ABNORMAL HIGH (ref 65–99)
Potassium: 4.1 mmol/L (ref 3.5–5.1)
Sodium: 136 mmol/L (ref 135–145)
Total Bilirubin: 0.3 mg/dL (ref 0.3–1.2)
Total Protein: 8.4 g/dL — ABNORMAL HIGH (ref 6.5–8.1)

## 2017-11-05 LAB — URIC ACID: Uric Acid, Serum: 7 mg/dL — ABNORMAL HIGH (ref 2.3–6.6)

## 2017-11-05 LAB — LACTATE DEHYDROGENASE: LDH: 166 U/L (ref 98–192)

## 2017-11-05 MED ORDER — ANASTROZOLE 1 MG PO TABS: 1.0000 mg | ORAL_TABLET | Freq: Every day | ORAL | 1 refills | Status: DC

## 2017-11-05 NOTE — Progress Notes (Addendum)
[facility] day:  11/05/2017   Chief Complaint: Joanna Phillips is a 67 y.o. female with chronic lymphocytic leukemia (CLL) and right breast DCIS who is seen for 3 month assessment.  HPI:  The patient was last seen in the medical oncology clinic on 08/06/2017.  At that time, she denied any B symptoms.  She has chronic post herpetic neuralgia pain.  Exam revealed no adenopathy or hepatosplenomegaly.  WBC was 11,000 with [place] 2100. Hemoglobin was 12.5 hematocrit 37.8, and platelets 223,000.  Serum protein was elevated.  She underwent a work-up.  SPEP revealed no monoclonal protein.  There was an apparent polyclonal gammopathy (IgG, kappa and lambda type).  Kappa free light chains were 121.8, lambda free light chains 15 with a ratio of 8.08 (0.26-1.65).  24 hour UPEP revealed 89 mg/24 hours of Bence Jones protein, kappa type.  M-spike was negative.  During the interim, patient is doing well today, and does not express any acute concerns. Patient denies B symptoms. Patient does not verbalize any concerns with regards to her breasts today. Patient  does perform monthly self breast examinations as recommended. Patient seen by provider at [facility] who orders her annual mammograms.   Patient with HZV rash to her head. There are no vesicles observed. She is using [place] and mupirocin topicals as ordered.    She has not experienced any significant interval infections. Patient notes that she is eating well. Weight has remains stable.   Patient denies pain in the clinic today.    Past Medical History:  Diagnosis Date  . Arthritis   . CLL (chronic lymphocytic leukemia) ([place]) 2011  . DCIS (ductal carcinoma in situ) 03/11/2016  . GERD (gastroesophageal reflux disease)     Past Surgical History:  Procedure Laterality Date  . BREAST BIOPSY Right 02/12/2016   path pending  . CHOLECYSTECTOMY  1996  . COLONOSCOPY  2013    Family History  Problem Relation Age of  Onset  . Cancer Father        lung  . Diabetes Mother   . Breast cancer Maternal Aunt     Social History:  reports that she has never smoked. She has never used smokeless tobacco. She reports that she does not drink alcohol or use drugs.  She retired on 05/26/2017.  She lives in [place].  The patient is accompanied by her husband today.  Allergies:  Allergies  Allergen Reactions  . Amoxicillin Hives  . Hydrocodone Itching  . Sulfamethoxazole-Trimethoprim Nausea Only  . Allopurinol Rash    Current Medications: Current Outpatient Medications  Medication Sig Dispense Refill  . amitriptyline (ELAVIL) 50 MG tablet Take 1 tablet (50 mg total) by mouth at bedtime. 90 tablet 3  . anastrozole (ARIMIDEX) 1 MG tablet Take 1 tablet (1 mg total) by mouth daily. 90 tablet 0  . Biotin 1 MG CAPS Take by mouth.    . calcium-vitamin D (OSCAL WITH D) 500-200 MG-UNIT TABS tablet Take by mouth.    . EUCRISA 2 % OINT     . fluticasone (FLONASE) 50 MCG/ACT nasal spray Place 2 sprays into both nostrils daily. 16 g 6  . gabapentin (NEURONTIN) 800 MG tablet Take 800 mg by mouth 3 (three) times daily.    . montelukast (SINGULAIR) 10 MG tablet Take by mouth.    . mupirocin ointment (BACTROBAN) 2 %     . omeprazole (PRILOSEC) 20 MG capsule Take 1 capsule by mouth once daily 90  capsule 3  . valACYclovir (VALTREX) 500 MG tablet Take 1 tablet (500 mg total) by mouth 2 (two) times daily. 180 tablet 1  . ibuprofen (ADVIL,MOTRIN) 800 MG tablet Take by mouth.    . loratadine (CLARITIN) 10 MG tablet Take 1 tablet (10 mg total) by mouth daily. (Patient not taking: Reported on 11/05/2017) 30 tablet 11   No current facility-administered medications for this visit.     Review of Systems:  GENERAL:  Feels good.  No fevers, sweats or weight loss. PERFORMANCE STATUS (ECOG):  1 HEENT:  No visual changes, runny nose, sore throat, mouth sores or tenderness. Lungs: No shortness of breath or cough.  No hemoptysis. Cardiac:   No chest pain, palpitations, orthopnea, or PND. GI:  No nausea, vomiting, diarrhea, constipation, melena or hematochezia. GU:  No urgency, frequency, dysuria, or hematuria. Musculoskeletal:  No back pain.  No joint pain.  No muscle tenderness. Extremities:  No pain or swelling. Skin:  Scalp continues to heal slowly.  No rashes or skin changes. Neuro:  No headache, numbness or weakness, balance or coordination issues. Endocrine:  No diabetes, thyroid issues, hot flashes or night sweats. Psych:  No mood changes, depression or anxiety. Pain:  No focal pain. Review of systems:  All other systems reviewed and found to be negative.   Physical Exam: Blood pressure 103/75, pulse 80, temperature 99.3 F (37.4 C), temperature source Tympanic, resp. rate 18, weight 213 lb (96.6 kg). GENERAL:  Well developed, well nourished, woman sitting comfortably in the exam room in no acute distress. MENTAL STATUS:  Alert and oriented to person, place and time. HEAD:  Wearing a white cap.  Short black hair with graying.  Pink right sided scalp with sparse hair and 2 open areas.  Normocephalic, atraumatic, face symmetric, no Cushingoid features. EYES:  Glasses.  Blue eyes.  Pupils equal round and reactive to light and accomodation.  No conjunctivitis or scleral icterus. ENT:  Oropharynx clear without lesion.  Tongue normal. Mucous membranes moist.  RESPIRATORY:  Clear to auscultation without rales, wheezes or rhonchi. CARDIOVASCULAR:  Regular rate and rhythm without murmur, rub or gallop. BREASTS:  Mild fibrocystic changes in upper quadrants bilaterally.  No discrete masses, skin changes or nipple discharge. ABDOMEN:  Soft, non-tender, with active bowel sounds, and no hepatosplenomegaly.  No masses. SKIN:  Scalp continues to improve slowly.  No vesicles.  2 small open areas.  No rashes, ulcers or lesions. EXTREMITIES: No edema, no skin discoloration or tenderness.  No palpable cords. LYMPH NODES: No palpable  cervical, supraclavicular, axillary or inguinal adenopathy  NEUROLOGICAL: Unremarkable. PSYCH:  Appropriate.    Appointment on 11/05/2017  Component Date Value Ref Range Status  . LDH 11/05/2017 166  98 - 192 U/L Final   Performed at [facility], [address] [place], [place] [address]  . Sodium 11/05/2017 136  135 - 145 mmol/L Final  . Potassium 11/05/2017 4.1  3.5 - 5.1 mmol/L Final  . Chloride 11/05/2017 107  101 - 111 mmol/L Final  . CO2 11/05/2017 24  22 - 32 mmol/L Final  . Glucose, Bld 11/05/2017 125* 65 - 99 mg/dL Final  . BUN 11/05/2017 21* 6 - 20 mg/dL Final  . Creatinine, Ser 11/05/2017 0.97  0.44 - 1.00 mg/dL Final  . Calcium 11/05/2017 8.8* 8.9 - 10.3 mg/dL Final  . Total Protein 11/05/2017 8.4* 6.5 - 8.1 g/dL Final  . Albumin 11/05/2017 3.8  3.5 - 5.0 g/dL Final  . AST 11/05/2017 33  15 - 41 U/L Final  . ALT 11/05/2017 25  14 - 54 U/L Final  . Alkaline Phosphatase 11/05/2017 68  38 - 126 U/L Final  . Total Bilirubin 11/05/2017 0.3  0.3 - 1.2 mg/dL Final  . GFR calc non Af Amer 11/05/2017 59* >60 mL/min Final  . GFR calc Af Amer 11/05/2017 >60  >60 mL/min Final   Comment: (NOTE) The eGFR has been calculated using the CKD EPI equation. This calculation has not been validated in all clinical situations. eGFR's persistently <60 mL/min signify possible Chronic Kidney Disease.   Georgiann Hahn gap 11/05/2017 5  5 - 15 Final   Performed at [facility], [address] [place], [place] [address]  . WBC 11/05/2017 11.3* 3.6 - 11.0 K/uL Final  . RBC 11/05/2017 4.23  3.80 - 5.20 MIL/uL Final  . Hemoglobin 11/05/2017 12.0  12.0 - 16.0 g/dL Final  . HCT 11/05/2017 35.7  35.0 - 47.0 % Final  . MCV 11/05/2017 84.2  80.0 - 100.0 fL Final  . MCH 11/05/2017 28.4  26.0 - 34.0 pg Final  . MCHC 11/05/2017 33.8  32.0 - 36.0 g/dL Final  . RDW 11/05/2017 15.5* 11.5 - 14.5 % Final  . Platelets 11/05/2017 197  150 - 440 K/uL Final  . Neutrophils Relative % 11/05/2017 21  %  Final  . Neutro Abs 11/05/2017 2.4  1.4 - 6.5 K/uL Final  . Lymphocytes Relative 11/05/2017 69  % Final  . Lymphs Abs 11/05/2017 7.8* 1.0 - 3.6 K/uL Final  . Monocytes Relative 11/05/2017 8  % Final  . Monocytes Absolute 11/05/2017 0.9  0.2 - 0.9 K/uL Final  . Eosinophils Relative 11/05/2017 1  % Final  . Eosinophils Absolute 11/05/2017 0.1  0 - 0.7 K/uL Final  . Basophils Relative 11/05/2017 1  % Final  . Basophils Absolute 11/05/2017 0.1  0 - 0.1 K/uL Final   Performed at [facility], [address] [place],  [address]    [facility] Labs: 06/08/2015: hematocrit of 35.1, hemoglobin 11.7, MCV 81, platelets 225,000, WBC 17,200 with an ANC of 3100.  Absolutle lymphocyte count (ALC) 12,200.  Uric acid 8.0 (elevated; last check 7.7- elevated).  Creatinine 0.90.  LDH 217. 12/05/2015: hematocrit of 34.1, hemoglobin 10.9, MCV 81, platelets 213,000, WBC 17,700 with an ANC of 3100.  ALC 13,600.  Uric acid 6.0.  Creatinine 0.81.  LDH 212. 05/14/2016: hematocrit of 34.0, hemoglobin 11.1, MCV 83, platelets 382,000, WBC 13,800 with an ANC of 4600.  ALC 7,700.   06/11/2016: hematocrit of 36.0, hemoglobin 11.5, MCV 82, platelets 213,000, WBC 8,100 with an ANC of 3300.  ALC 3,900.  Creatinine 0.84.  Ferritin 79.  Iron saturation 12%.  Reticulocyte count 1.1%.  Coombs negative. 07/18/2016: hematocrit of 35.6, hemoglobin 11.8, MCV 83, platelets 200,000, WBC 6,900 with an ANC of 2600.  ALC 3,600.  Creatinine 0.78.  LFTs normal. 10/17/2016: hematocrit of 35.9, hemoglobin 11.8, MCV 84, platelets 248,000, WBC 10,500 with an ANC of 3200.  ALC 3,600.  Creatinine 0.93.  LFTs normal. 02/02/2017: hematocrit of 32.2, hemoglobin 10.4, MCV 83, platelets 361,000, WBC 15,700 with an ANC of 7200.  ALC 7,200.  Creatinine 0.84.  LFTs normal.  Albumen 3.5. 05/01/2017: hematocrit of 34.3, hemoglobin 11.1, MCV 79, platelets 281,000, WBC 9,200 with an ANC of 2900.  ALC 5,200.  Creatinine 0.87.  LFTs normal.  Albumin 3.5.  B12 was 383. Folate 12.6, Ferritin 125. Reticulocyte count 1.9%.   Assessment:  Joanna Phillips  is a 67 y.o. female with stage I chronic lymphocytic leukemia diagnosed in 2011.  She was diagnosed with right breast DCIS in 02/2016.  CLL:  She presented with mild lymphocytosis and small cervical adenopathy on routine physical exam.  CBC revealed only lymphocytosis.  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  SPEP was normal.  Chest, abdomen, and pelvic CT scan revealed small diffuse nodes.  DCIS:  She underwent wire-guided right partial mastectomy on 03/11/2016 at [facility].  Pathology revealed a 3.9 cm grade III DCIS.  ER was strongly positive (Allred score 8) in 100% of cells.  PR was strongly positive (Allred score 6) in 30% of cells.  Pathologic stage was TisNx.  She completed breast radiation on 06/25/2016.    She began Arimidex on 06/26/2016.  She is tolerating treatment well.   Bilateral mammogram at [facility] on 12/19/2016 revealed no evidence of malignancy.   There was interval development of post lumpectomy changes.  She has a history of intermittent rectal and vaginal bleeding in 2015.  She was seen by gynecology at the [facility].  Pelvic exam and ultrasound revealed fibroids.   EGD on 11/11/2011 revealed a hiatal hernia and gastritis.  Colonoscopy on 11/11/2011 revealed diverticulosis in the ascending, descending, and sigmoid colon.  Stool guaiacs x 3 were negative in 06/2014.  She denies any melena or hematochezia.   She has a history of varicella zoster involving the right side of her scalp.  She was admitted to [facility] from 12/21/2015 - 12/25/2015 with disseminated varicella zoster.  She was treated with IV acyclovir then switched to oral valacyclovir (completed 01/10/2016).  She is on prophylactic valacyclovir.  Scalp has undergone debridement at the  wound care center.  Bone density study on 05/21/2016 was normal with a T score of -0.3 in the AP spine L1-L4 and -0.3 in the left femoral neck.  Symptomatically, she denies any B symptoms.  She has chronic post herpetic neuralgia pain.  Exam reveals no adenopathy or hepatosplenomegaly. She has slow healing scalp s/p zoster infection.  WBC is 11,300 with ANC 2400. Hemoglobin is 12.0 hematocrit 35.7, and platelets 197,000.    Plan: 1. Labs:  CBC with diff, CMP, LDH, uric acid. 2. Continue Arimidex as previously prescribed.  3. RTC in 6 months for MD assessment with breast exam and labs (CBC with diff, CMP, LDH, uric acid).   Honor Loh, NP  11/05/2017, 3:18 PM   I saw and evaluated the patient, participating in the key portions of the service and reviewing pertinent diagnostic studies and records.  I reviewed the nurse practitioner's note and agree with the findings and the plan.  The assessment and plan were discussed with the patient. Several questions were asked by the patient and answered.   Nolon Stalls, MD 11/05/2017,3:18 PM

## 2017-11-05 NOTE — Telephone Encounter (Signed)
Below note entered in error. Wrong chart.

## 2017-11-05 NOTE — Progress Notes (Signed)
Pt and husband in for follow up today.  Pt denies any difficulties or concerns today.  States "been feeling well".

## 2017-11-05 NOTE — Telephone Encounter (Signed)
Opened in error

## 2017-11-05 NOTE — Telephone Encounter (Signed)
Patient called and stated she was very anxious about the results of her CT. She has been unable to reach Shawn at the number she was given.

## 2017-11-15 ENCOUNTER — Encounter: Payer: Self-pay | Admitting: Nurse Practitioner

## 2017-12-09 IMAGING — US US CAROTID DUPLEX BILAT
1 series · 13 of 24 positions shown · non-contrast
Comparison: MRI 02/21/2016.

CLINICAL DATA: Right arm tingling and numbness.

EXAM:
BILATERAL CAROTID DUPLEX ULTRASOUND
TECHNIQUE: Gray scale imaging, color Doppler and duplex ultrasound were
performed of bilateral carotid and vertebral arteries in the neck.

[Series 1: us carotid duplex bilat · 0.06mm/px · 13 of 64 slices shown]
[im 1/64]
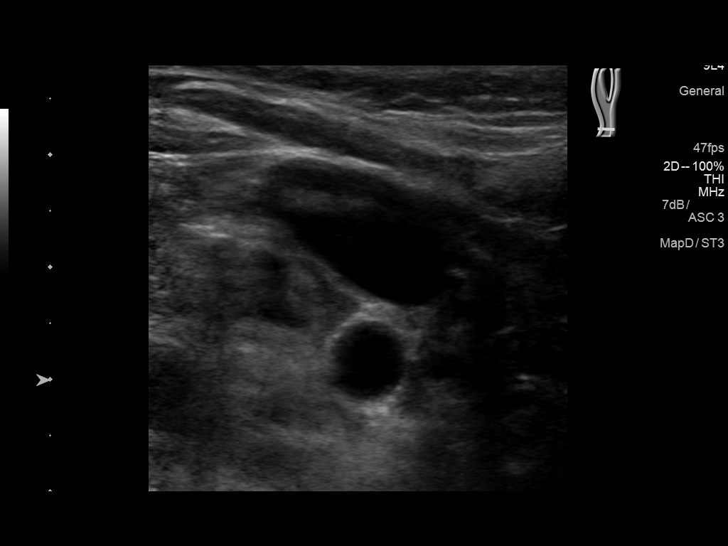
[im 6/64]
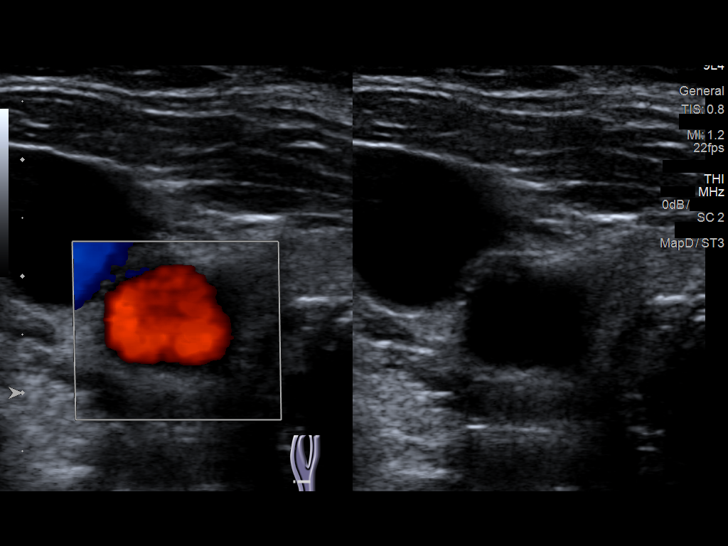
[im 11/64]
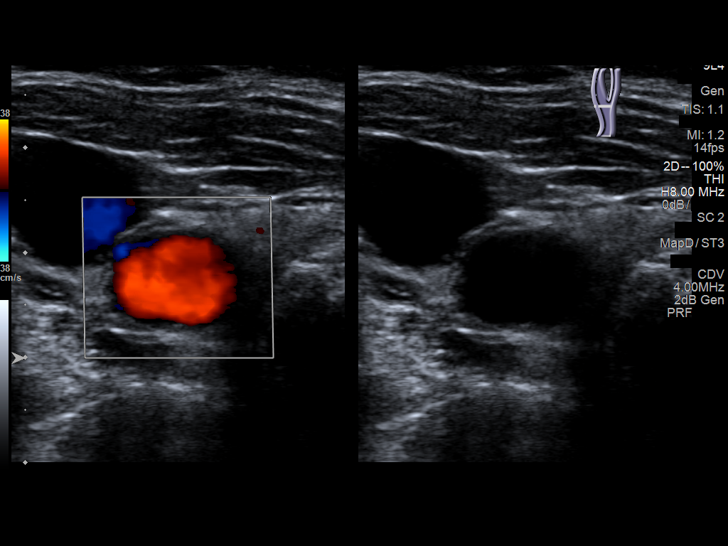
[im 17/64]
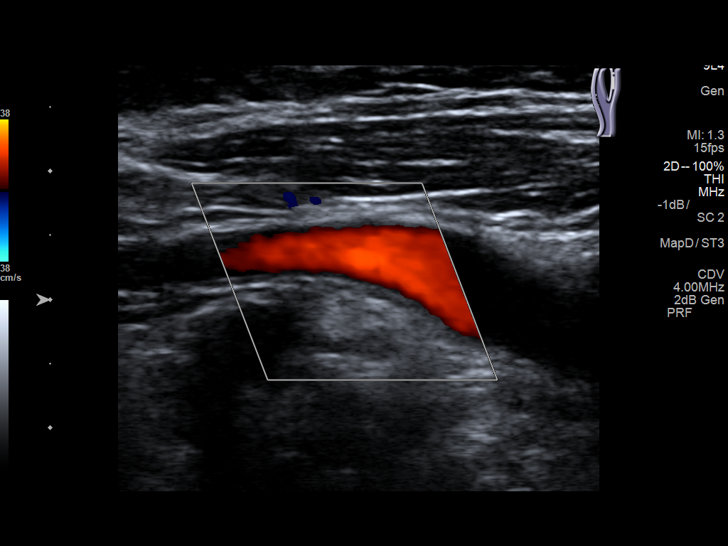
[im 22/64]
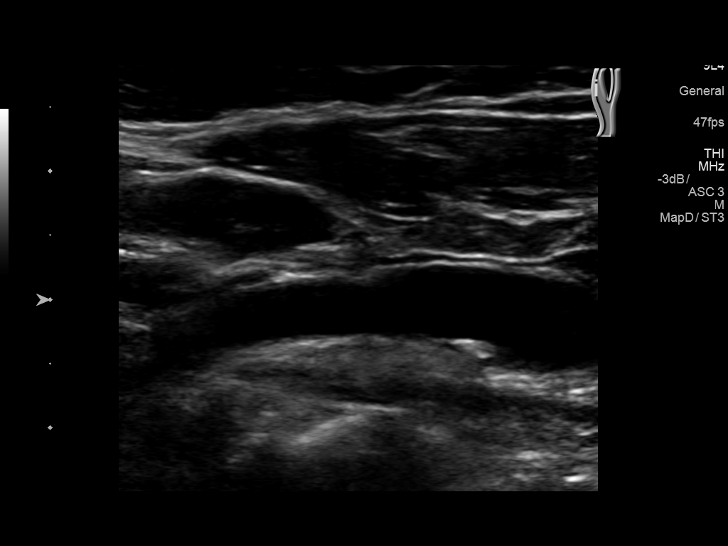
[im 28/64]
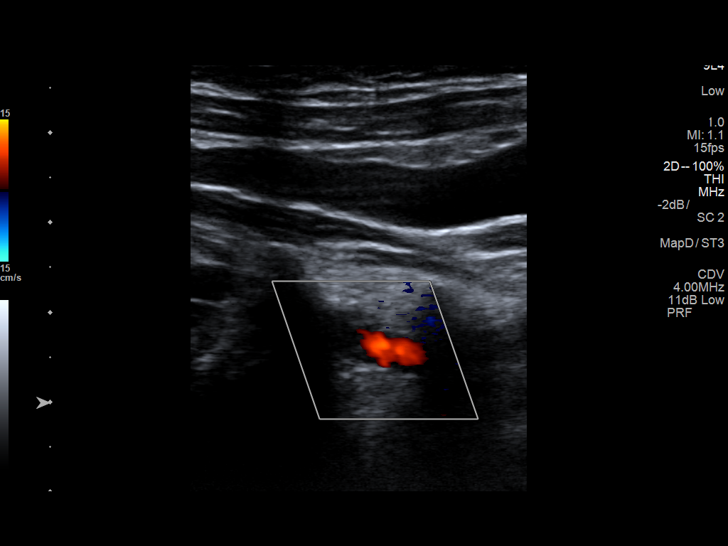
[im 33/64]
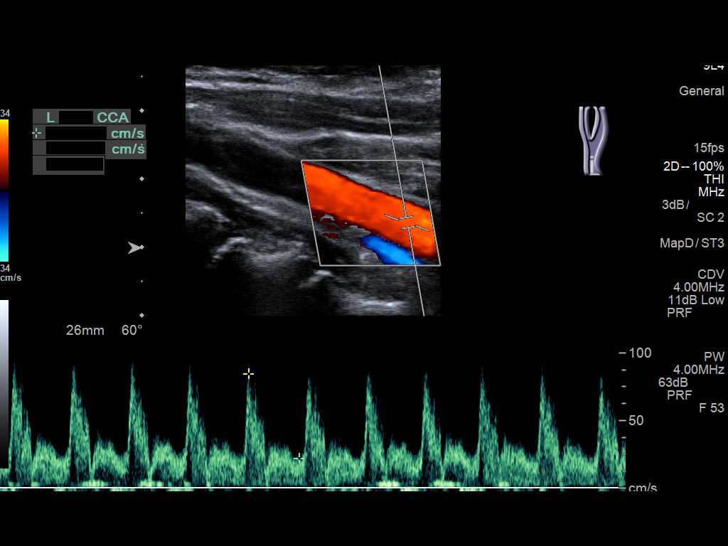
[im 36/64]
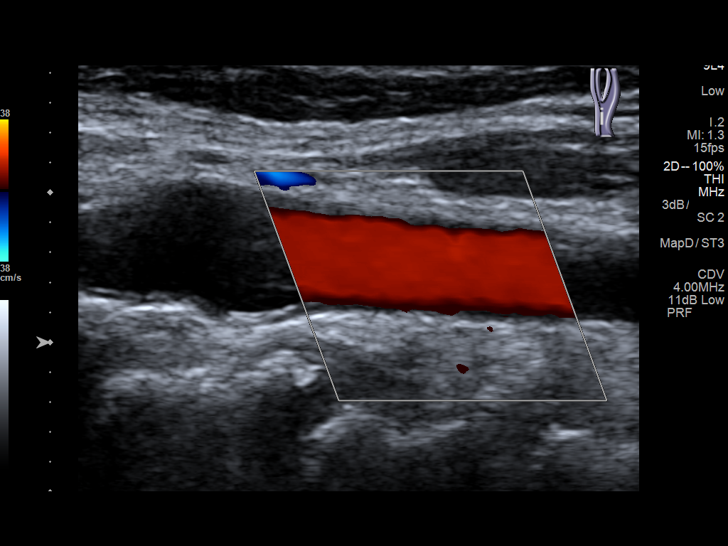
[im 42/64]
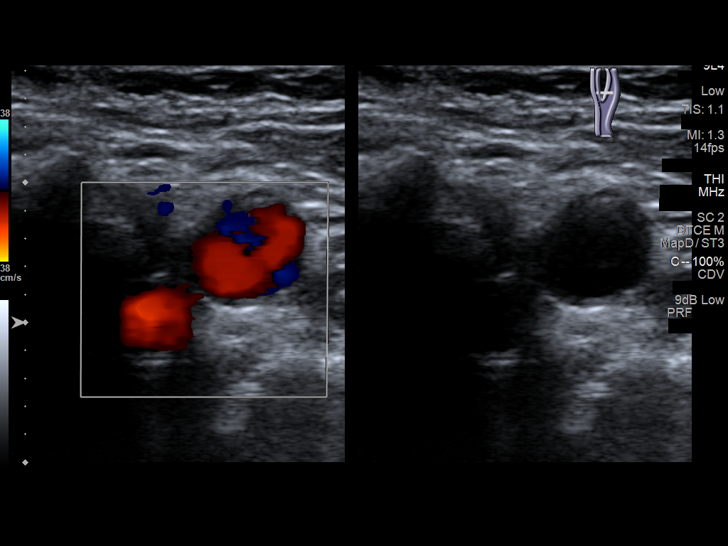
[im 47/64]
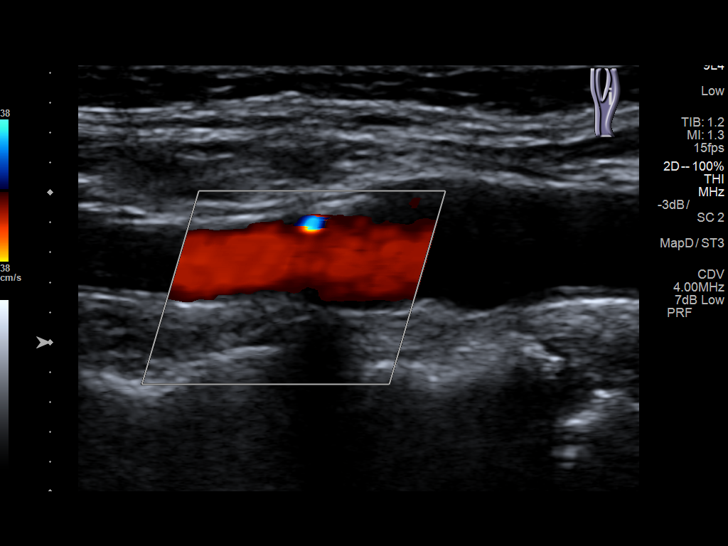
[im 53/64]
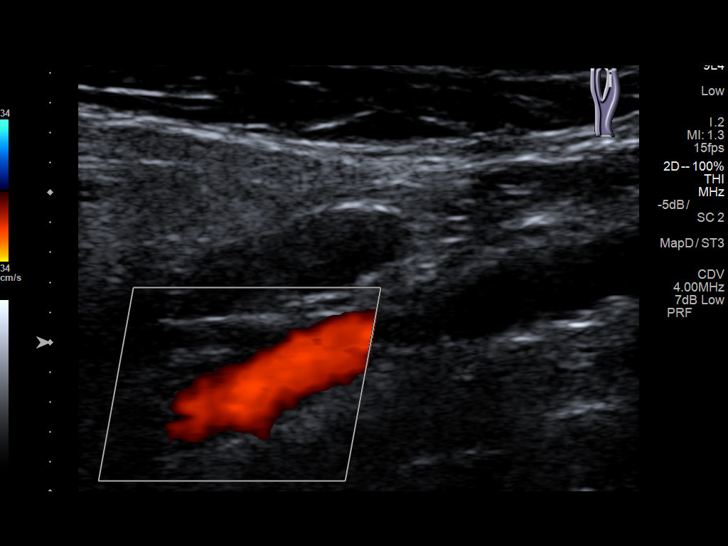
[im 58/64]
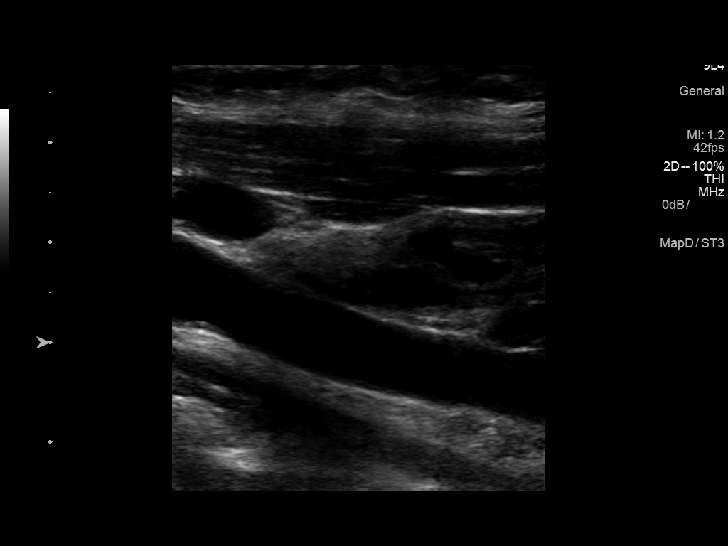
[im 64/64]
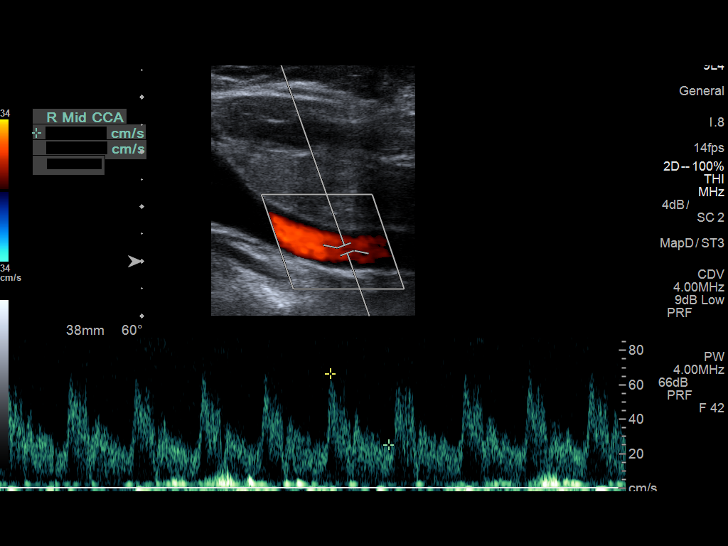

[13 of 24 positions shown; findings below may reference images not displayed]

FINDINGS: Criteria: Quantification of carotid stenosis is based on velocity
parameters that correlate the residual internal carotid diameter
with NASCET-based stenosis levels, using the diameter of the distal
internal carotid lumen as the denominator for stenosis measurement.

The following velocity measurements were obtained:

RIGHT

ICA:  83/32 cm/sec

CCA:  66/25 cm/sec

SYSTOLIC ICA/CCA RATIO:

DIASTOLIC ICA/CCA RATIO:

ECA:  90 cm/sec

LEFT

ICA:  84/30 a cm/sec

CCA:  87/28 cm/sec

SYSTOLIC ICA/CCA RATIO:

DIASTOLIC ICA/CCA RATIO:

ECA:  106 cm/sec

RIGHT CAROTID ARTERY: Mild right carotid bifurcation and proximal
ICA atherosclerotic vascular disease. No flow limiting stenosis.

RIGHT VERTEBRAL ARTERY:  Patent with antegrade flow.

LEFT CAROTID ARTERY: Mild left carotid bifurcation and proximal ICA
atherosclerotic vascular disease. No flow limiting stenosis.

LEFT VERTEBRAL ARTERY:  Patent with antegrade flow.
IMPRESSION: 1. Mild bilateral carotid bifurcation and proximal ICA
atherosclerotic vascular disease. No flow limiting stenosis

2. Vertebrals are patent we antegrade flow.

## 2017-12-18 ENCOUNTER — Telehealth: Payer: Self-pay | Admitting: Nurse Practitioner

## 2017-12-18 NOTE — Telephone Encounter (Signed)
Pt. Wife called wanted to know if she took the shingle shot no she still need to take medication. Pt  Call back # [phone]

## 2017-12-21 NOTE — Telephone Encounter (Signed)
Attempted to contact the pt, no answer. Left message on vm to return my call.

## 2017-12-21 NOTE — Telephone Encounter (Signed)
The pt was notified. No questions or concerns. 

## 2017-12-21 NOTE — Telephone Encounter (Signed)
It is safe for her and it is recommended to get the vaccine.  If pharmacist needs an order, I can send one.  Pharmacist is likely assuming she is dealing with a current shingles outbreak because she is on Valtrex.  She is taking this for suppression not treatment.  As far as continuing Valtrex suppressive medication, we would need to discuss stopping this on a trial basis at least 2-4 weeks after vaccination.

## 2017-12-25 DIAGNOSIS — Z803 Family history of malignant neoplasm of breast: Secondary | ICD-10-CM | POA: Diagnosis not present

## 2017-12-25 DIAGNOSIS — Z1239 Encounter for other screening for malignant neoplasm of breast: Secondary | ICD-10-CM | POA: Diagnosis not present

## 2017-12-25 DIAGNOSIS — Z86000 Personal history of in-situ neoplasm of breast: Secondary | ICD-10-CM | POA: Diagnosis not present

## 2017-12-25 DIAGNOSIS — R922 Inconclusive mammogram: Secondary | ICD-10-CM | POA: Diagnosis not present

## 2017-12-25 DIAGNOSIS — Z853 Personal history of malignant neoplasm of breast: Secondary | ICD-10-CM | POA: Diagnosis not present

## 2018-02-04 IMAGING — MG MM DIGITAL SCREENING BILAT W/ TOMO W/ CAD
8 of 15 series · 8 of 31 positions shown · non-contrast
Comparison: Previous exam(s).

CLINICAL DATA: Screening.

EXAM:
2D DIGITAL SCREENING BILATERAL MAMMOGRAM WITH CAD AND ADJUNCT TOMO

[R MLO synth-2D]
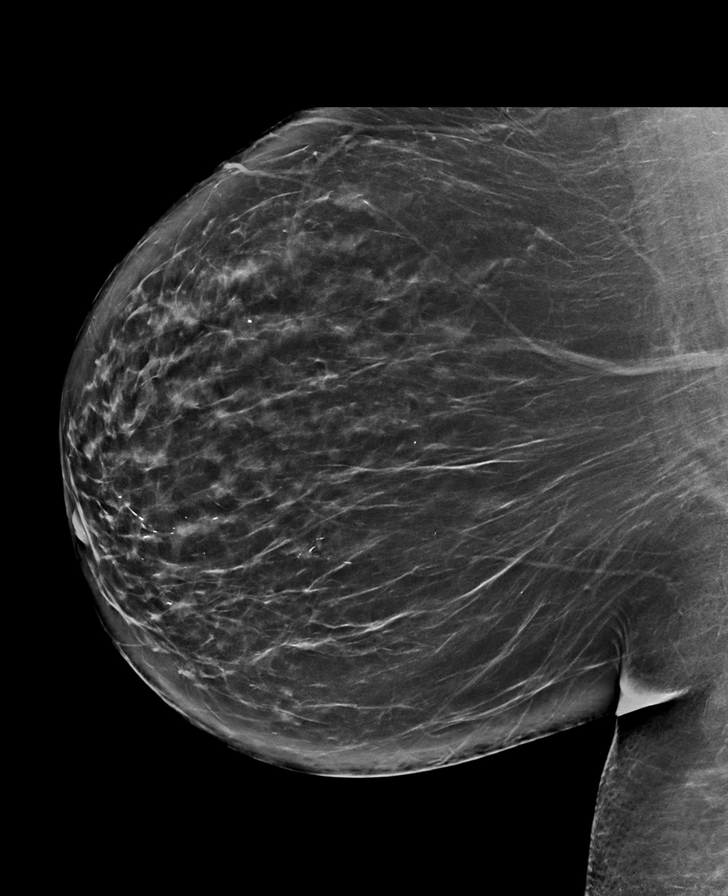

[L CC synth-2D]
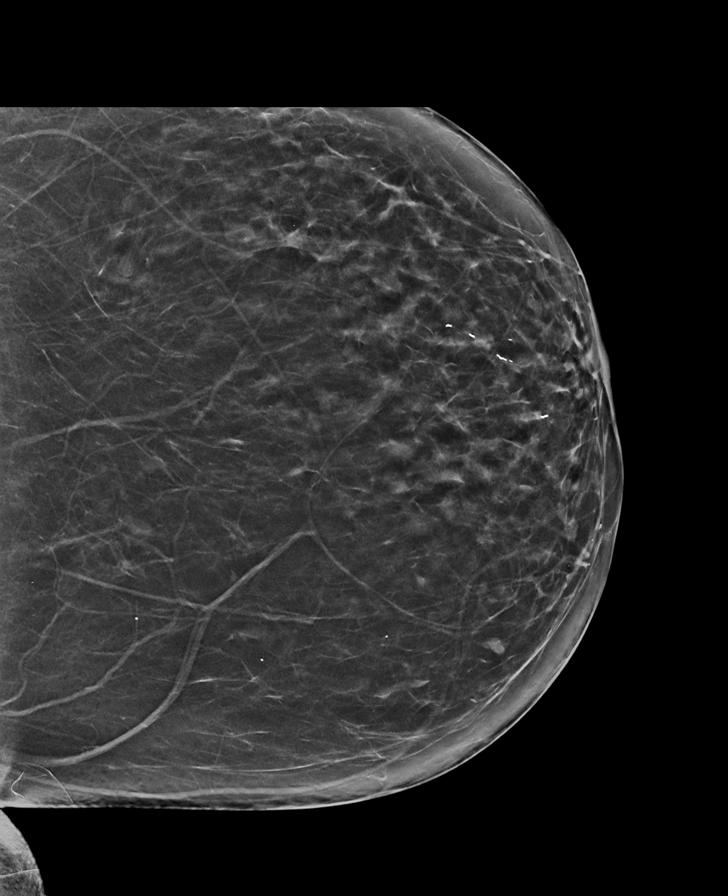

[R CC synth-2D]
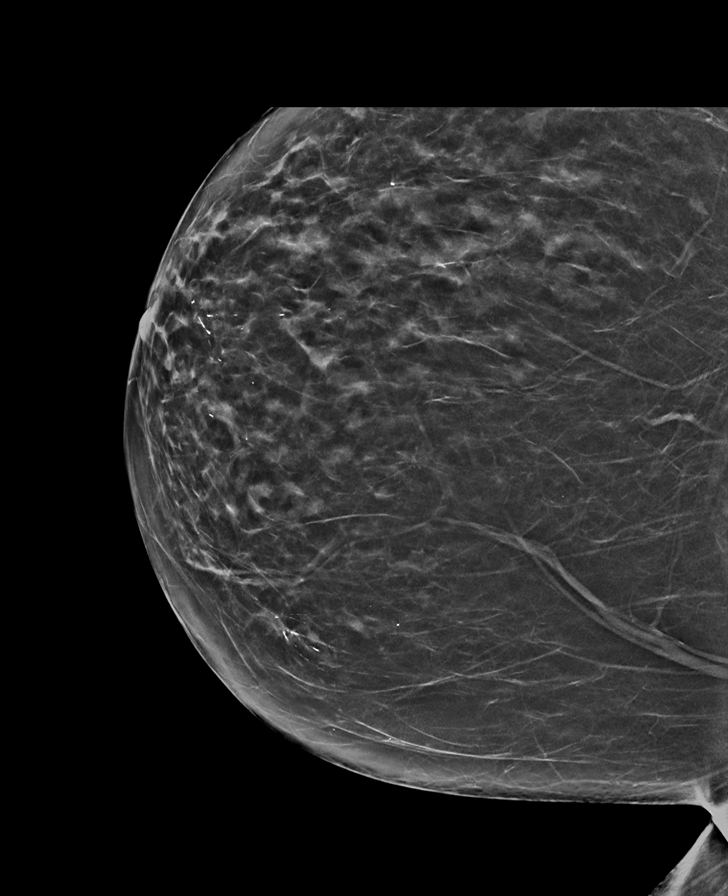

[L CC]
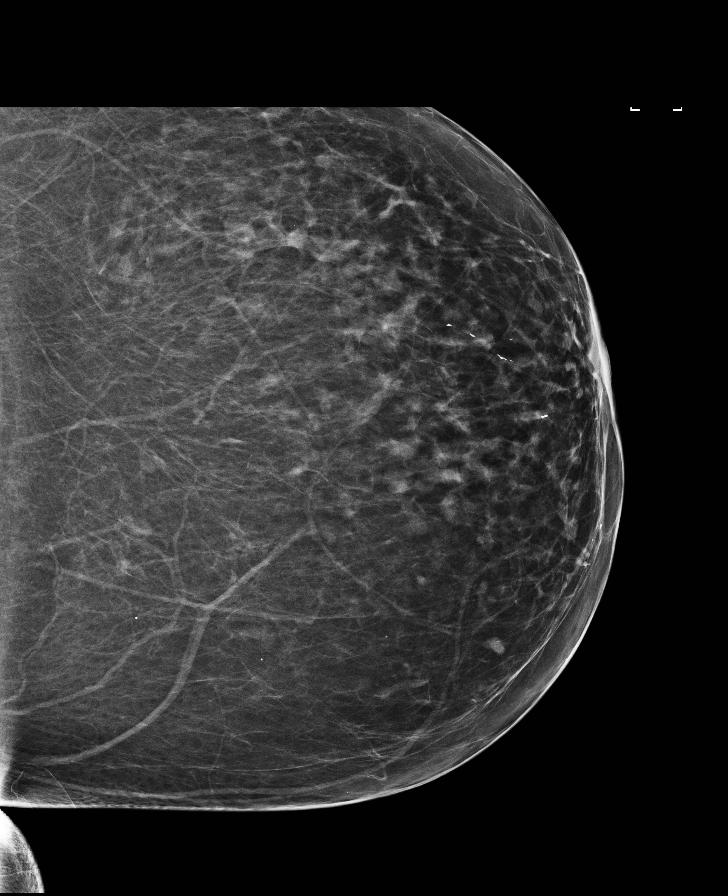

[L MLO]
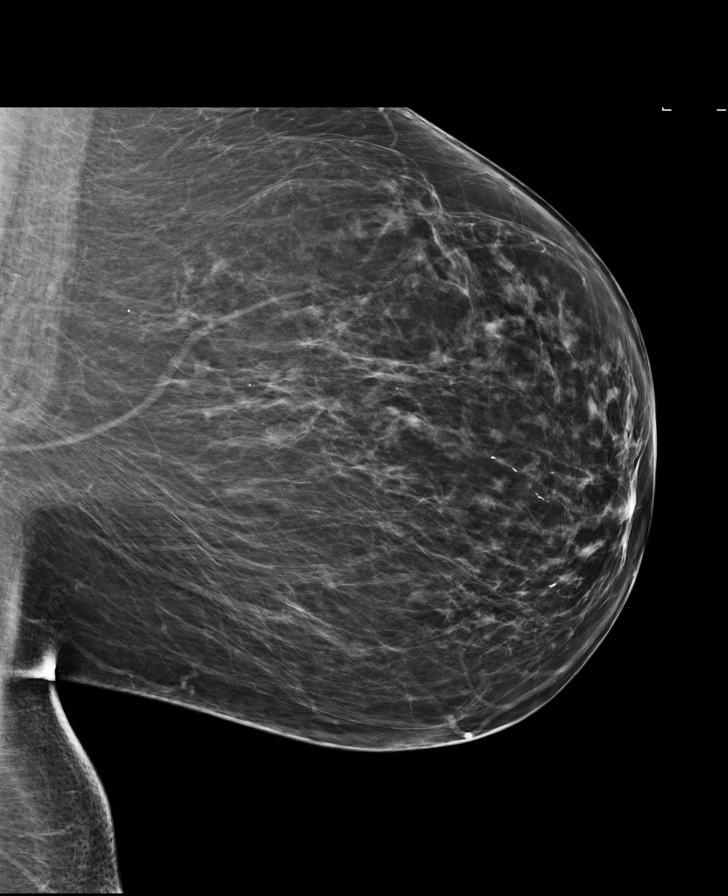

[L MLO synth-2D]
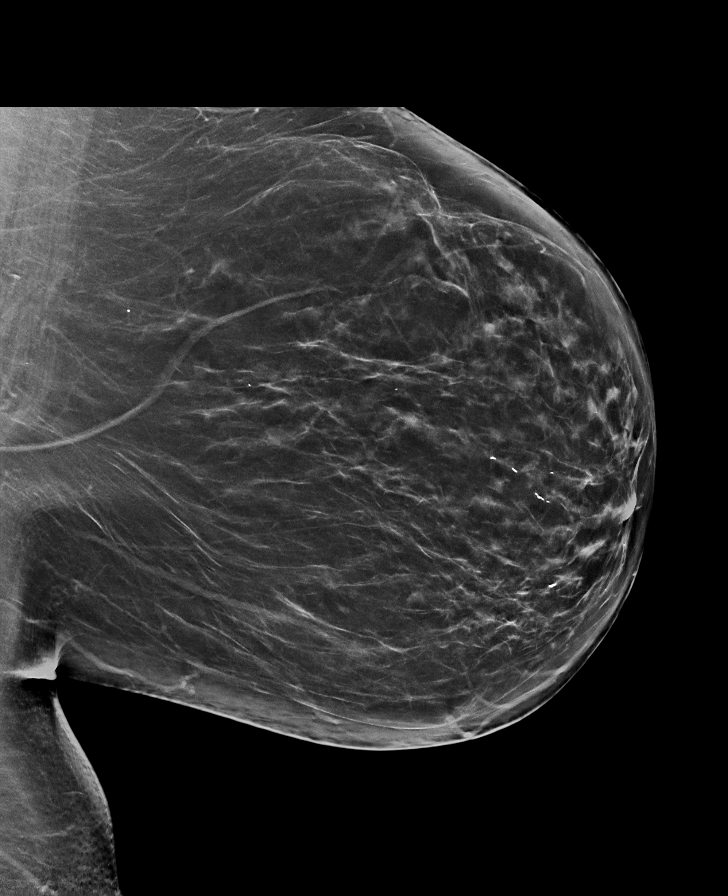

[R MLO]
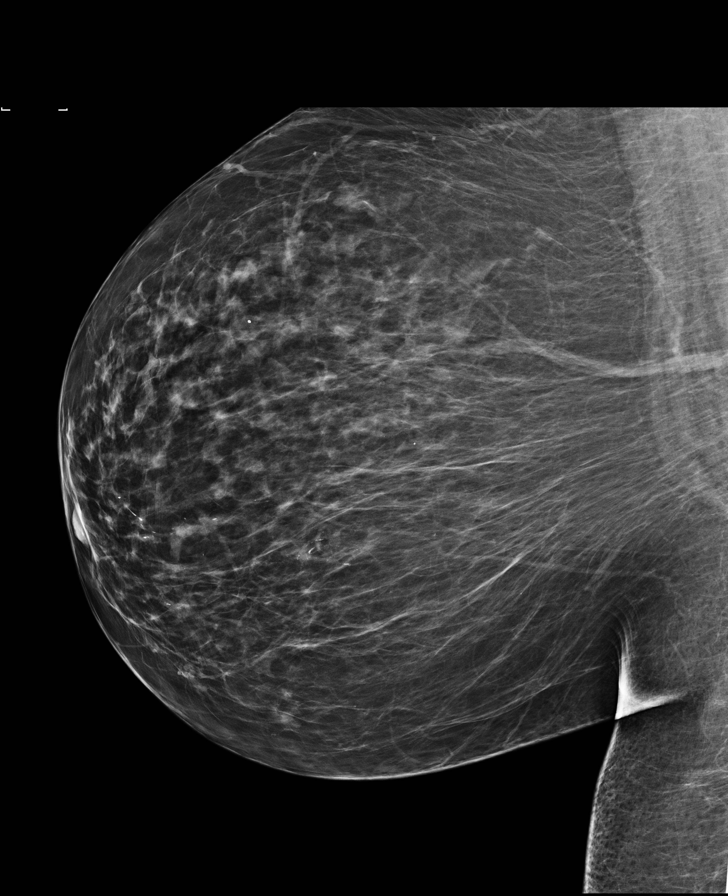

[R CC]
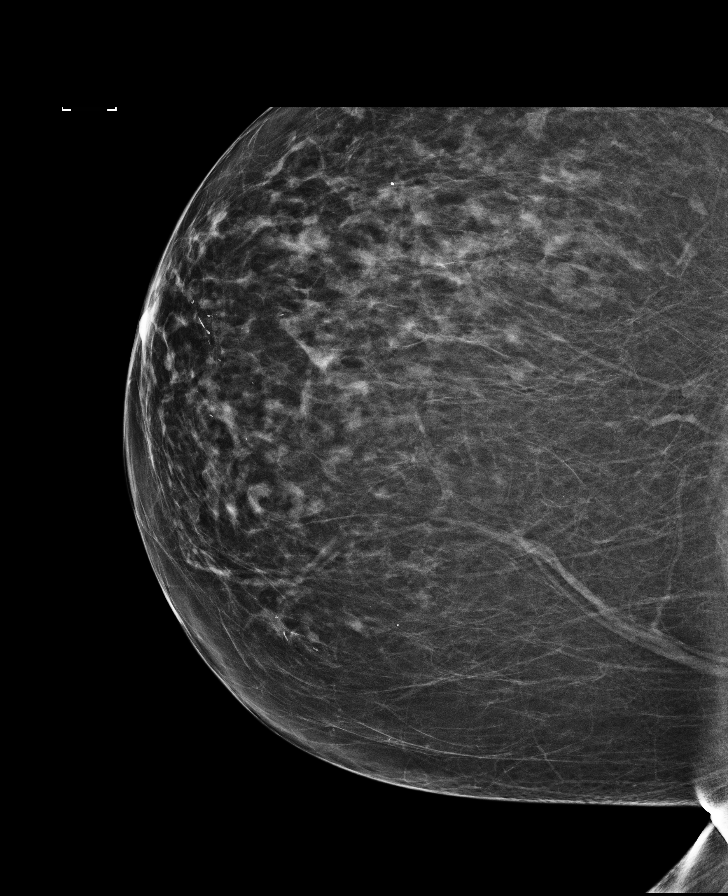

[8 of 31 positions shown; findings below may reference images not displayed]

ACR Breast Density Category b: There are scattered areas of
fibroglandular density.
FINDINGS: In the right breast, calcifications warrant further evaluation with
magnified views. In the left breast, no findings suspicious for
malignancy. Images were processed with CAD.
IMPRESSION: Further evaluation is suggested for calcifications in the right
breast.

RECOMMENDATION:
Diagnostic mammogram of the right breast. (Code:[id number])

The patient will be contacted regarding the findings, and additional
imaging will be scheduled.

BI-RADS CATEGORY  0: Incomplete. Need additional imaging evaluation
and/or prior mammograms for comparison.

## 2018-02-20 DIAGNOSIS — Z23 Encounter for immunization: Secondary | ICD-10-CM | POA: Diagnosis not present

## 2018-02-25 ENCOUNTER — Ambulatory Visit
Admission: RE | Admit: 2018-02-25 | Discharge: 2018-02-25 | Disposition: A | Discharge status: Home / Self Care | Payer: Medicare Other | Source: Ambulatory Visit | Attending: Radiation Oncology | Admitting: Radiation Oncology

## 2018-02-25 ENCOUNTER — Encounter: Payer: Self-pay | Admitting: Radiation Oncology

## 2018-02-25 ENCOUNTER — Other Ambulatory Visit: Payer: Self-pay

## 2018-02-25 DIAGNOSIS — Z923 Personal history of irradiation: Secondary | ICD-10-CM | POA: Insufficient documentation

## 2018-02-25 DIAGNOSIS — Z170 Estrogen receptor positive status [ER+]: Secondary | ICD-10-CM | POA: Diagnosis not present

## 2018-02-25 DIAGNOSIS — Z79811 Long term (current) use of aromatase inhibitors: Secondary | ICD-10-CM | POA: Insufficient documentation

## 2018-02-25 DIAGNOSIS — D0511 Intraductal carcinoma in situ of right breast: Secondary | ICD-10-CM

## 2018-02-25 NOTE — Progress Notes (Signed)
Radiation Oncology Follow up Note  Name: Joanna Phillips   Date:   02/25/2018 MRN:  [id number] DOB: 28-Dec-1950    This 67 y.o. female presents to the clinic today for 18 with follow-up status post whole breast radiation to her right breast for ER/PR positive ductal carcinoma in situ.  REFERRING PROVIDER: Nobie Putnam *  HPI: patient is a 67 year old female now out 18 months having completed whole breast radiation to her right breast for ER/PR positive ductal carcinoma in situ. Seen today in routine follow-up she is doing well. She specifically denies breast tenderness cough or bone pain..she's currently on arimadex tolerating that well without side effect.she has her mammograms done at [facility] her last one was in August 2019 BI-RADS 2 benign.  COMPLICATIONS OF TREATMENT: none  FOLLOW UP COMPLIANCE: keeps appointments   PHYSICAL EXAM:  BP (P) 127/76 (BP Location: Right Arm, Patient Position: Sitting)   Pulse (P) 93   Temp (!) (P) 97.5 F (36.4 C) (Tympanic)   Wt (P) 201 lb 9.8 oz (91.4 kg)   BMI (P) 33.55 kg/m  Lungs are clear to A&P cardiac examination essentially unremarkable with regular rate and rhythm. No dominant mass or nodularity is noted in either breast in 2 positions examined. Incision is well-healed. No axillary or supraclavicular adenopathy is appreciated. Cosmetic result is excellent.Well-developed well-nourished patient in NAD. HEENT reveals PERLA, EOMI, discs not visualized.  Oral cavity is clear. No oral mucosal lesions are identified. Neck is clear without evidence of cervical or supraclavicular adenopathy. Lungs are clear to A&P. Cardiac examination is essentially unremarkable with regular rate and rhythm without murmur rub or thrill. Abdomen is benign with no organomegaly or masses noted. Motor sensory and DTR levels are equal and symmetric in the upper and lower extremities. Cranial nerves II through XII are grossly intact. Proprioception is intact. No peripheral  adenopathy or edema is identified. No motor or sensory levels are noted. Crude visual fields are within normal range.  RADIOLOGY RESULTS: mammogram report reviewed  PLAN: present time she is doing well with no evidence of disease. I'm please were overall progress. She is a rescheduled for follow-up mammograms next year. She continues on arimadex without side effect. I have asked to see her back in 1 year for follow-up. Patient is to call sooner with any concerns.  I would like to take this opportunity to thank you for allowing me to participate in the care of your patient.Noreene Filbert, MD

## 2018-03-01 DIAGNOSIS — L603 Nail dystrophy: Secondary | ICD-10-CM | POA: Diagnosis not present

## 2018-03-01 DIAGNOSIS — L821 Other seborrheic keratosis: Secondary | ICD-10-CM | POA: Diagnosis not present

## 2018-03-01 DIAGNOSIS — B029 Zoster without complications: Secondary | ICD-10-CM | POA: Diagnosis not present

## 2018-04-05 ENCOUNTER — Other Ambulatory Visit: Payer: Self-pay

## 2018-04-13 ENCOUNTER — Other Ambulatory Visit: Payer: Self-pay | Admitting: Nurse Practitioner

## 2018-04-13 DIAGNOSIS — B0229 Other postherpetic nervous system involvement: Secondary | ICD-10-CM

## 2018-04-13 DIAGNOSIS — B027 Disseminated zoster: Secondary | ICD-10-CM

## 2018-05-07 ENCOUNTER — Inpatient Hospital Stay: Payer: Medicare Other | Attending: Hematology and Oncology

## 2018-05-07 ENCOUNTER — Inpatient Hospital Stay (HOSPITAL_BASED_OUTPATIENT_CLINIC_OR_DEPARTMENT_OTHER): Payer: Medicare Other | Admitting: Hematology and Oncology

## 2018-05-07 ENCOUNTER — Other Ambulatory Visit: Payer: Self-pay

## 2018-05-07 VITALS — BP 109/71 | HR 90 | Temp 98.6°F | Resp 18 | Wt 200.8 lb

## 2018-05-07 DIAGNOSIS — Z79899 Other long term (current) drug therapy: Secondary | ICD-10-CM | POA: Diagnosis not present

## 2018-05-07 DIAGNOSIS — Z170 Estrogen receptor positive status [ER+]: Secondary | ICD-10-CM

## 2018-05-07 DIAGNOSIS — C9110 Chronic lymphocytic leukemia of B-cell type not having achieved remission: Secondary | ICD-10-CM | POA: Insufficient documentation

## 2018-05-07 DIAGNOSIS — D0511 Intraductal carcinoma in situ of right breast: Secondary | ICD-10-CM | POA: Diagnosis not present

## 2018-05-07 DIAGNOSIS — Z79811 Long term (current) use of aromatase inhibitors: Secondary | ICD-10-CM | POA: Diagnosis not present

## 2018-05-07 LAB — COMPREHENSIVE METABOLIC PANEL
ALT: 24 U/L (ref 0–44)
AST: 29 U/L (ref 15–41)
Albumin: 3.8 g/dL (ref 3.5–5.0)
Alkaline Phosphatase: 75 U/L (ref 38–126)
Anion gap: 6 (ref 5–15)
BUN: 21 mg/dL (ref 8–23)
CO2: 24 mmol/L (ref 22–32)
Calcium: 8.3 mg/dL — ABNORMAL LOW (ref 8.9–10.3)
Chloride: 108 mmol/L (ref 98–111)
Creatinine, Ser: 0.95 mg/dL (ref 0.44–1.00)
GFR calc Af Amer: >60 mL/min (ref >60)
GFR calc non Af Amer: >60 mL/min (ref >60)
Glucose, Bld: 217 mg/dL — ABNORMAL HIGH (ref 70–99)
Potassium: 3.7 mmol/L (ref 3.5–5.1)
Sodium: 138 mmol/L (ref 135–145)
Total Bilirubin: 0.5 mg/dL (ref 0.3–1.2)
Total Protein: 8.2 g/dL — ABNORMAL HIGH (ref 6.5–8.1)

## 2018-05-07 LAB — CBC WITH DIFFERENTIAL/PLATELET
Abs Immature Granulocytes: 0.01 10*3/uL (ref 0.00–0.07)
Basophils Absolute: 0 10*3/uL (ref 0.0–0.1)
Basophils Relative: 0 %
Eosinophils Absolute: 0.2 10*3/uL (ref 0.0–0.5)
Eosinophils Relative: 2 %
HCT: 37 % (ref 36.0–46.0)
Hemoglobin: 11.9 g/dL — ABNORMAL LOW (ref 12.0–15.0)
Immature Granulocytes: 0 %
Lymphocytes Relative: 70 %
Lymphs Abs: 6.8 10*3/uL — ABNORMAL HIGH (ref 0.7–4.0)
MCH: 27.4 pg (ref 26.0–34.0)
MCHC: 32.2 g/dL (ref 30.0–36.0)
MCV: 85.3 fL (ref 80.0–100.0)
Monocytes Absolute: 0.7 10*3/uL (ref 0.1–1.0)
Monocytes Relative: 7 %
Neutro Abs: 2 10*3/uL (ref 1.7–7.7)
Neutrophils Relative %: 21 %
Platelets: 186 10*3/uL (ref 150–400)
RBC: 4.34 MIL/uL (ref 3.87–5.11)
RDW: 14.7 % (ref 11.5–15.5)
WBC: 9.6 10*3/uL (ref 4.0–10.5)
nRBC: 0 % (ref 0.0–0.2)

## 2018-05-07 LAB — LACTATE DEHYDROGENASE: LDH: 177 U/L (ref 98–192)

## 2018-05-07 LAB — URIC ACID: Uric Acid, Serum: 7 mg/dL (ref 2.5–7.1)

## 2018-05-07 NOTE — Progress Notes (Signed)
[facility] day:  05/07/2018   Chief Complaint: Joanna Phillips is a 67 y.o. female with chronic lymphocytic leukemia (CLL) and right breast DCIS who is seen for 6 month assessment.  HPI:  The patient was last seen in the medical oncology clinic on 11/05/2017.  At that time, she denied any B symptoms.  She had chronic post herpetic neuralgia pain.  Exam revealed no adenopathy or hepatosplenomegaly. She had slow healing scalp s/p zoster infection.  WBC was 11,300 with ANC 2400. Hemoglobin was 12.0 hematocrit 35.7, and platelets 197,000.    She saw Dr. Juan Phillips at [facility] on 17/49/4496.  Notes reviewed.  She was doing well.  Next visit in 1 year.  Bilateral diagnostic mammogram at [facility] on 12/25/2017 revealed no evidence of malignancy.  There was stable architectural distortion at the surgical site.  There were no suspicious masses, calcifications, or other findings in either breast.  She was seen by Dr. Baruch Phillips on 02/25/2018.  Notes reviewed.  She was doing well.  She has a follow-up in 1 year.  During the interim, patient is doing well.  Her scalp has improved and her hair is growing back. She hasn't needed to wear a hat since 01/2018.  She attributes improved to using "mint and tea tree oil shampoo". Balance and speech have improved.    Past Medical History:  Diagnosis Date  . Arthritis   . CLL (chronic lymphocytic leukemia) ([place]) 2011  . DCIS (ductal carcinoma in situ) 03/11/2016  . GERD (gastroesophageal reflux disease)     Past Surgical History:  Procedure Laterality Date  . BREAST BIOPSY Right 02/12/2016   path pending  . CHOLECYSTECTOMY  1996  . COLONOSCOPY  2013    Family History  Problem Relation Age of Onset  . Cancer Father        lung  . Diabetes Mother   . Breast cancer Maternal Aunt     Social History:  reports that she has never smoked. She has never used smokeless tobacco. She reports that she does not drink  alcohol or use drugs.  She retired on 05/26/2017.  She lives in [place].  The patient is accompanied by her husband today.  Allergies:  Allergies  Allergen Reactions  . Amoxicillin Hives  . Hydrocodone Itching  . Sulfamethoxazole-Trimethoprim Nausea Only  . Allopurinol Rash    Current Medications: Current Outpatient Medications  Medication Sig Dispense Refill  . anastrozole (ARIMIDEX) 1 MG tablet Take 1 tablet (1 mg total) by mouth daily. 90 tablet 1  . Biotin 1 MG CAPS Take by mouth.    . calcium-vitamin D (OSCAL WITH D) 500-200 MG-UNIT TABS tablet Take by mouth.    . chlorhexidine (PERIDEX) 0.12 % solution RINSE AND SPIT USING A SMALL AMOUNT TWO TO THREE TIMES DAILY    . EUCRISA 2 % OINT     . fluticasone (FLONASE) 50 MCG/ACT nasal spray Place 2 sprays into both nostrils daily. 16 g 6  . gabapentin (NEURONTIN) 800 MG tablet Take 800 mg by mouth 3 (three) times daily.    . mupirocin ointment (BACTROBAN) 2 %     . omeprazole (PRILOSEC) 20 MG capsule Take 1 capsule by mouth once daily 90 capsule 3  . acyclovir ointment (ZOVIRAX) 5 % acyclovir 5 % topical ointment    . amitriptyline (ELAVIL) 50 MG tablet Take 1 tablet (50 mg total) by mouth at bedtime. (Patient not taking: Reported on 05/07/2018) 90 tablet 3  .  ibuprofen (ADVIL,MOTRIN) 800 MG tablet Take by mouth.    . loratadine (CLARITIN) 10 MG tablet Take 1 tablet (10 mg total) by mouth daily. (Patient not taking: Reported on 05/07/2018) 30 tablet 11  . montelukast (SINGULAIR) 10 MG tablet Take by mouth.    . valACYclovir (VALTREX) 500 MG tablet TAKE 1 TABLET BY MOUTH TWICE DAILY (Patient not taking: Reported on 05/07/2018) 180 tablet 1   No current facility-administered medications for this visit.     Review of Systems:  GENERAL:  Feels good.  No fevers, sweats.  Weight loss of 13 pounds on the keto diet. PERFORMANCE STATUS (ECOG):  1 HEENT:  No visual changes, runny nose, sore throat, mouth sores or tenderness. Lungs: No  shortness of breath or cough.  No hemoptysis. Cardiac:  No chest pain, palpitations, orthopnea, or PND. GI:  No nausea, vomiting, diarrhea, constipation, melena or hematochezia. GU:  No urgency, frequency, dysuria, or hematuria. Musculoskeletal:  No back pain.  No joint pain.  No muscle tenderness. Extremities:  No pain or swelling. Skin:  Scalp healed.  Sores gone.  Slightly tender to touch.  No rashes or skin changes. Neuro:  Speech better.  No headache, numbness or weakness, balance or coordination issues. Endocrine:  No diabetes, thyroid issues, hot flashes or night sweats. Psych:  No mood changes, depression or anxiety. Pain:  No focal pain. Review of systems:  All other systems reviewed and found to be negative.   Physical Exam: Blood pressure 109/71, pulse 90, temperature 98.6 F (37 C), temperature source Tympanic, resp. rate 18, weight 200 lb 12.8 oz (91.1 kg). GENERAL:  Well developed, well nourished, woman sitting comfortably in the exam room in no acute distress. MENTAL STATUS:  Alert and oriented to person, place and time. HEAD:  [name] hair.  Normocephalic, atraumatic, face symmetric, no Cushingoid features. EYES:  Glasses.  Blue eyes.  Pupils equal round and reactive to light and accomodation.  No conjunctivitis or scleral icterus. ENT:  Oropharynx clear without lesion.  Tongue normal. Mucous membranes moist.  RESPIRATORY:  Clear to auscultation without rales, wheezes or rhonchi. CARDIOVASCULAR:  Regular rate and rhythm without murmur, rub or gallop. ABDOMEN:  Soft, non-tender, with active bowel sounds, and no hepatosplenomegaly.  No masses. SKIN:  Scalp well healed.  No rashes, ulcers or lesions. EXTREMITIES: No edema, no skin discoloration or tenderness.  No palpable cords. LYMPH NODES: No palpable cervical, supraclavicular, axillary or inguinal adenopathy  NEUROLOGICAL: Unremarkable. PSYCH:  Appropriate.    Appointment on 05/07/2018  Component Date Value Ref Range  Status  . LDH 05/07/2018 177  98 - 192 U/L Final   Performed at [facility], [address] [place], [place] [address]  . Sodium 05/07/2018 138  135 - 145 mmol/L Final  . Potassium 05/07/2018 3.7  3.5 - 5.1 mmol/L Final  . Chloride 05/07/2018 108  98 - 111 mmol/L Final  . CO2 05/07/2018 24  22 - 32 mmol/L Final  . Glucose, Bld 05/07/2018 217* 70 - 99 mg/dL Final  . BUN 05/07/2018 21  8 - 23 mg/dL Final  . Creatinine, Ser 05/07/2018 0.95  0.44 - 1.00 mg/dL Final  . Calcium 05/07/2018 8.3* 8.9 - 10.3 mg/dL Final  . Total Protein 05/07/2018 8.2* 6.5 - 8.1 g/dL Final  . Albumin 05/07/2018 3.8  3.5 - 5.0 g/dL Final  . AST 05/07/2018 29  15 - 41 U/L Final  . ALT 05/07/2018 24  0 - 44 U/L Final  . Alkaline Phosphatase  05/07/2018 75  38 - 126 U/L Final  . Total Bilirubin 05/07/2018 0.5  0.3 - 1.2 mg/dL Final  . GFR calc non Af Amer 05/07/2018 >60  >60 mL/min Final  . GFR calc Af Amer 05/07/2018 >60  >60 mL/min Final  . Anion gap 05/07/2018 6  5 - 15 Final   Performed at [facility], [address], [place], [place] [address]  . WBC 05/07/2018 9.6  4.0 - 10.5 K/uL Final  . RBC 05/07/2018 4.34  3.87 - 5.11 MIL/uL Final  . Hemoglobin 05/07/2018 11.9* 12.0 - 15.0 g/dL Final  . HCT 05/07/2018 37.0  36.0 - 46.0 % Final  . MCV 05/07/2018 85.3  80.0 - 100.0 fL Final  . MCH 05/07/2018 27.4  26.0 - 34.0 pg Final  . MCHC 05/07/2018 32.2  30.0 - 36.0 g/dL Final  . RDW 05/07/2018 14.7  11.5 - 15.5 % Final  . Platelets 05/07/2018 186  150 - 400 K/uL Final  . nRBC 05/07/2018 0.0  0.0 - 0.2 % Final   Performed at [facility], [address] [place], [place] [address]  . Neutrophils Relative % 05/07/2018 PENDING  % Incomplete  . Neutro Abs 05/07/2018 PENDING  1.7 - 7.7 K/uL Incomplete  . Band Neutrophils 05/07/2018 PENDING  % Incomplete  . Lymphocytes Relative 05/07/2018 PENDING  % Incomplete  . Lymphs Abs 05/07/2018 PENDING  0.7 - 4.0 K/uL Incomplete  . Monocytes Relative  05/07/2018 PENDING  % Incomplete  . Monocytes Absolute 05/07/2018 PENDING  0.1 - 1.0 K/uL Incomplete  . Eosinophils Relative 05/07/2018 PENDING  % Incomplete  . Eosinophils Absolute 05/07/2018 PENDING  0.0 - 0.5 K/uL Incomplete  . Basophils Relative 05/07/2018 PENDING  % Incomplete  . Basophils Absolute 05/07/2018 PENDING  0.0 - 0.1 K/uL Incomplete  . WBC Morphology 05/07/2018 PENDING   Incomplete  . RBC Morphology 05/07/2018 PENDING   Incomplete  . Smear Review 05/07/2018 PENDING   Incomplete  . Other 05/07/2018 PENDING  % Incomplete  . nRBC 05/07/2018 PENDING  0 /100 WBC Incomplete  . Metamyelocytes Relative 05/07/2018 PENDING  % Incomplete  . Myelocytes 05/07/2018 PENDING  % Incomplete  . Promyelocytes Relative 05/07/2018 PENDING  % Incomplete  . Blasts 05/07/2018 PENDING  % Incomplete    [facility] Labs: 06/08/2015: hematocrit of 35.1, hemoglobin 11.7, MCV 81, platelets 225,000, WBC 17,200 with an ANC of 3100.  Absolutle lymphocyte count (ALC) 12,200.  Uric acid 8.0 (elevated; last check 7.7- elevated).  Creatinine 0.90.  LDH 217. 12/05/2015: hematocrit of 34.1, hemoglobin 10.9, MCV 81, platelets 213,000, WBC 17,700 with an ANC of 3100.  ALC 13,600.  Uric acid 6.0.  Creatinine 0.81.  LDH 212. 05/14/2016: hematocrit of 34.0, hemoglobin 11.1, MCV 83, platelets 382,000, WBC 13,800 with an ANC of 4600.  ALC 7,700.   06/11/2016: hematocrit of 36.0, hemoglobin 11.5, MCV 82, platelets 213,000, WBC 8,100 with an ANC of 3300.  ALC 3,900.  Creatinine 0.84.  Ferritin 79.  Iron saturation 12%.  Reticulocyte count 1.1%.  Coombs negative. 07/18/2016: hematocrit of 35.6, hemoglobin 11.8, MCV 83, platelets 200,000, WBC 6,900 with an ANC of 2600.  ALC 3,600.  Creatinine 0.78.  LFTs normal. 10/17/2016: hematocrit of 35.9, hemoglobin 11.8, MCV 84, platelets 248,000, WBC 10,500 with an ANC of 3200.  ALC 3,600.  Creatinine 0.93.  LFTs normal. 02/02/2017: hematocrit of 32.2, hemoglobin 10.4, MCV 83, platelets  361,000, WBC 15,700 with an ANC of 7200.  ALC 7,200.  Creatinine 0.84.  LFTs normal.  Albumen 3.5. 05/01/2017: hematocrit of 34.3, hemoglobin 11.1, MCV 79, platelets 281,000, WBC 9,200 with an ANC of 2900.  ALC 5,200.  Creatinine 0.87.  LFTs normal.  Albumin 3.5. B12 was 383. Folate 12.6, Ferritin 125. Reticulocyte count 1.9%.   Assessment:  Joanna Phillips is a 67 y.o. female with stage I chronic lymphocytic leukemia diagnosed in 2011.  She was diagnosed with right breast DCIS in 02/2016.  CLL:  She presented with mild lymphocytosis and small cervical adenopathy on routine physical exam.  CBC revealed only lymphocytosis.  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  SPEP was normal.  Chest, abdomen, and pelvic CT scan revealed small diffuse nodes.  DCIS:  She underwent wire-guided right partial mastectomy on 03/11/2016 at [facility].  Pathology revealed a 3.9 cm grade III DCIS.  ER was strongly positive (Allred score 8) in 100% of cells.  PR was strongly positive (Allred score 6) in 30% of cells.  Pathologic stage was TisNx.  She completed breast radiation on 06/25/2016.    She began Arimidex on 06/26/2016.  She is tolerating treatment well.   Bilateral mammogram at [facility] on 12/19/2016 revealed no evidence of malignancy.   There was interval development of post lumpectomy changes.  Bilateral diagnostic mammogram at [facility] on 12/25/2017 revealed no evidence of malignancy.  She has a history of intermittent rectal and vaginal bleeding in 2015.  She was seen by gynecology at the [facility].  Pelvic exam and ultrasound revealed fibroids.   EGD on 11/11/2011 revealed a hiatal hernia and gastritis.  Colonoscopy on 11/11/2011 revealed diverticulosis in the ascending, descending, and sigmoid colon.  Stool guaiacs x 3 were negative in 06/2014.  She denies any melena or  hematochezia.   She has a history of varicella zoster involving the right side of her scalp.  She was admitted to [facility] from 12/21/2015 - 12/25/2015 with disseminated varicella zoster.  She was treated with IV acyclovir then switched to oral valacyclovir (completed 01/10/2016).  She is on prophylactic valacyclovir.  Scalp has undergone debridement at the wound care center.  Bone density study on 05/21/2016 was normal with a T score of -0.3 in the AP spine L1-L4 and -0.3 in the left femoral neck.  Symptomatically, she is doing well.  She denies any B symptoms or breast concerns. Exam reveals no adenopathy or hepatosplenomegaly.  Her scalp has healed.  WBC is 9,600 with [place] 2000. Hemoglobin is 11.9, hematocrit 37.0, and platelets 186,000.    Plan: 1. Labs:  CBC with diff, CMP, LDH, uric acid. 2. Chronic lymphocytic leukemia:  Clinically doing well.  Continue surveillance. 3. Right breast DCIS:  Review interval mammogram - no evidence of malignancy.  Continue Arimidex.  Breast exam at next visit. 4.  Health maintenance:  Schedule bone density on 05/21/2018. 5.  RTC in 4 months for MD assessment, labs (CBC with diff, CMP, LDH, uric acid), and breast exam.   Honor Loh, NP  05/07/2018, 3:01 PM   I saw and evaluated the patient, participating in the key portions of the service and reviewing pertinent diagnostic studies and records.  I reviewed the nurse practitioner's note and agree with the findings and the plan.  The assessment and plan were discussed with the patient.  Several questions were asked by the patient and answered.   Nolon Stalls, MD 05/07/2018,3:01 PM

## 2018-05-07 NOTE — Progress Notes (Signed)
Here for follow up " I am WAY better than the last time I was here. Recovering from shingles "

## 2018-05-08 ENCOUNTER — Encounter: Payer: Self-pay | Admitting: Hematology and Oncology

## 2018-05-20 ENCOUNTER — Other Ambulatory Visit: Payer: Self-pay | Admitting: Urgent Care

## 2018-06-16 ENCOUNTER — Ambulatory Visit
Admission: RE | Admit: 2018-06-16 | Discharge: 2018-06-16 | Disposition: A | Discharge status: Home / Self Care | Payer: Medicare Other | Source: Ambulatory Visit | Attending: Urgent Care | Admitting: Urgent Care

## 2018-06-16 DIAGNOSIS — D0511 Intraductal carcinoma in situ of right breast: Secondary | ICD-10-CM | POA: Insufficient documentation

## 2018-06-16 DIAGNOSIS — Z79811 Long term (current) use of aromatase inhibitors: Secondary | ICD-10-CM | POA: Insufficient documentation

## 2018-06-16 DIAGNOSIS — Z780 Asymptomatic menopausal state: Secondary | ICD-10-CM | POA: Diagnosis not present

## 2018-07-13 ENCOUNTER — Ambulatory Visit (INDEPENDENT_AMBULATORY_CARE_PROVIDER_SITE_OTHER): Payer: Medicare Other | Admitting: Nurse Practitioner

## 2018-07-13 ENCOUNTER — Encounter: Payer: Self-pay | Admitting: Nurse Practitioner

## 2018-07-13 ENCOUNTER — Other Ambulatory Visit: Payer: Self-pay

## 2018-07-13 VITALS — BP 114/66 | HR 85 | Temp 98.6°F | Ht 65.0 in | Wt 198.6 lb

## 2018-07-13 DIAGNOSIS — Z136 Encounter for screening for cardiovascular disorders: Secondary | ICD-10-CM

## 2018-07-13 DIAGNOSIS — R7309 Other abnormal glucose: Secondary | ICD-10-CM | POA: Diagnosis not present

## 2018-07-13 DIAGNOSIS — Z13220 Encounter for screening for lipoid disorders: Secondary | ICD-10-CM

## 2018-07-13 DIAGNOSIS — J301 Allergic rhinitis due to pollen: Secondary | ICD-10-CM

## 2018-07-13 DIAGNOSIS — B0229 Other postherpetic nervous system involvement: Secondary | ICD-10-CM

## 2018-07-13 DIAGNOSIS — K219 Gastro-esophageal reflux disease without esophagitis: Secondary | ICD-10-CM | POA: Diagnosis not present

## 2018-07-13 MED ORDER — LORATADINE 10 MG PO TABS: 10.0000 mg | ORAL_TABLET | Freq: Every day | ORAL | 11 refills | Status: DC

## 2018-07-13 MED ORDER — FLUTICASONE PROPIONATE 50 MCG/ACT NA SUSP: 2.0000 | Freq: Every day | NASAL | 11 refills | Status: DC

## 2018-07-13 MED ORDER — MONTELUKAST SODIUM 10 MG PO TABS: 10.0000 mg | ORAL_TABLET | Freq: Every day | ORAL | 3 refills | Status: DC

## 2018-07-13 MED ORDER — GABAPENTIN 800 MG PO TABS: 800.0000 mg | ORAL_TABLET | Freq: Two times a day (BID) | ORAL | 1 refills | Status: DC

## 2018-07-13 MED ORDER — OMEPRAZOLE 20 MG PO CPDR: DELAYED_RELEASE_CAPSULE | ORAL | 3 refills | Status: DC

## 2018-07-13 NOTE — Patient Instructions (Addendum)
Gwendolyn Grant,   Thank you for coming in to clinic today.  1. Resume flonase, loratadine, and montelukast.  2. Continue gabapentin twice daily as needed for nerve pain.  3. Continue omeprazole for acid reflux.  You will be due for [place].  This means you should eat no food or drink after midnight.  Drink only water or coffee without cream/sugar on the morning of your lab visit. - Please go ahead and go to the medical mall for a "Lab Only" visit in the next 7 days. - Your results will be available about 2-3 days after blood draw.  If you have set up a MyChart account, you can can log in to MyChart online to view your results and a brief explanation. Also, we can discuss your results together at your next office visit if you would like.  Please schedule a follow-up appointment with Cassell Smiles, AGNP. Return in about 6 months (around 01/11/2019) for GERD, allergic rhinitis.  If you have any other questions or concerns, please feel free to call the clinic or send a message through [place]. You may also schedule an earlier appointment if necessary.  You will receive a survey after today's visit either digitally by e-mail or paper by [employer]. Your experiences and feedback matter to [place].  Please respond so we know how we are doing as we provide care for you.   Cassell Smiles, DNP, AGNP-BC Adult Gerontology Nurse Practitioner [place]

## 2018-07-13 NOTE — Progress Notes (Signed)
Subjective:    Patient ID: Joanna Phillips, female    DOB: [birth year], 68 y.o.   MRN: [id number]  Joanna Phillips is a 68 y.o. female presenting on 07/13/2018 for Gastroesophageal Reflux and Allergies (watery eyes, nasal drainage, post nasal drainage x 6 mths)   HPI GERD Improved symptoms with about 25 lbs weight loss in last year.   - She reports no n/v, coffee ground emesis, dark/black/tarry stool, BRBPR, or other GI bleeding. - No usual breakthrough symptoms.  Postherpetic neuralgia Patient continues on gabapentin.  Continued shingles pain.  Patient is taking gabapentin for continued improvement.  Still has tenderness with any pressure of full facial nerve to neck.  Hair is also growing in.  - Patient has gotten her second shingrix vaccine today.  Allergic rhinitis  excessive tearing.  Worse in last 6 months.  Patient has stopped her loratadine.  Continues on montelukast but needs refills.  CKD Last checked and normal kidney function likely 2/2 postherpetic neuralgia pain.  Social History   Tobacco Use  . Smoking status: Never Smoker  . Smokeless tobacco: Never Used  Substance Use Topics  . Alcohol use: No  . Drug use: No    Review of Systems Per HPI unless specifically indicated above     Objective:    BP 114/66 (BP Location: Right Arm, Patient Position: Sitting, Cuff Size: Normal)   Pulse 85   Temp 98.6 F (37 C) (Oral)   Ht 5\' 5"  (1.651 m)   Wt 198 lb 9.6 oz (90.1 kg)   BMI 33.05 kg/m   Wt Readings from Last 3 Encounters:  07/13/18 198 lb 9.6 oz (90.1 kg)  05/07/18 200 lb 12.8 oz (91.1 kg)  02/25/18 (P) 201 lb 9.8 oz (91.4 kg)    Physical Exam Vitals signs reviewed.  Constitutional:      General: She is not in acute distress.    Appearance: She is well-developed.  HENT:     Head: Normocephalic and atraumatic.  Cardiovascular:     Rate and Rhythm: Normal rate and regular rhythm.     Pulses:          Radial pulses are 2+ on the right side and 2+ on the  left side.       Posterior tibial pulses are 1+ on the right side and 1+ on the left side.     Heart sounds: Normal heart sounds, S1 normal and S2 normal.  Pulmonary:     Effort: Pulmonary effort is normal. No respiratory distress.     Breath sounds: Normal breath sounds and air entry.  Abdominal:     General: Abdomen is flat. Bowel sounds are normal.     Palpations: Abdomen is soft.     Tenderness: There is no abdominal tenderness.  Musculoskeletal:     Right lower leg: No edema.     Left lower leg: No edema.  Skin:    General: Skin is warm and dry.     Capillary Refill: Capillary refill takes less than 2 seconds.  Neurological:     Mental Status: She is alert and oriented to person, place, and time.  Psychiatric:        Attention and Perception: Attention normal.        Mood and Affect: Mood and affect normal.        Behavior: Behavior normal. Behavior is cooperative.        Thought Content: Thought content normal.  Judgment: Judgment normal.    Results for orders placed or performed in visit on 05/07/18  Uric acid  Result Value Ref Range   Uric Acid, Serum 7.0 2.5 - 7.1 mg/dL  Lactate dehydrogenase  Result Value Ref Range   LDH 177 98 - 192 U/L  Comprehensive metabolic panel  Result Value Ref Range   Sodium 138 135 - 145 mmol/L   Potassium 3.7 3.5 - 5.1 mmol/L   Chloride 108 98 - 111 mmol/L   CO2 24 22 - 32 mmol/L   Glucose, Bld 217 (H) 70 - 99 mg/dL   BUN 21 8 - 23 mg/dL   Creatinine, Ser 0.95 0.44 - 1.00 mg/dL   Calcium 8.3 (L) 8.9 - 10.3 mg/dL   Total Protein 8.2 (H) 6.5 - 8.1 g/dL   Albumin 3.8 3.5 - 5.0 g/dL   AST 29 15 - 41 U/L   ALT 24 0 - 44 U/L   Alkaline Phosphatase 75 38 - 126 U/L   Total Bilirubin 0.5 0.3 - 1.2 mg/dL   GFR calc non Af Amer >60 >60 mL/min   GFR calc Af Amer >60 >60 mL/min   Anion gap 6 5 - 15  CBC with Differential  Result Value Ref Range   WBC 9.6 4.0 - 10.5 K/uL   RBC 4.34 3.87 - 5.11 MIL/uL   Hemoglobin 11.9 (L) 12.0 -  15.0 g/dL   HCT 37.0 36.0 - 46.0 %   MCV 85.3 80.0 - 100.0 fL   MCH 27.4 26.0 - 34.0 pg   MCHC 32.2 30.0 - 36.0 g/dL   RDW 14.7 11.5 - 15.5 %   Platelets 186 150 - 400 K/uL   nRBC 0.0 0.0 - 0.2 %   Neutrophils Relative % 21 %   Neutro Abs 2.0 1.7 - 7.7 K/uL   Lymphocytes Relative 70 %   Lymphs Abs 6.8 (H) 0.7 - 4.0 K/uL   Monocytes Relative 7 %   Monocytes Absolute 0.7 0.1 - 1.0 K/uL   Eosinophils Relative 2 %   Eosinophils Absolute 0.2 0.0 - 0.5 K/uL   Basophils Relative 0 %   Basophils Absolute 0.0 0.0 - 0.1 K/uL   WBC Morphology DIFF CONFIRMED BY MANUAL    Smear Review CONSISTANT WITH KNOW CLL    Immature Granulocytes 0 %   Abs Immature Granulocytes 0.01 0.00 - 0.07 K/uL      Assessment & Plan:   Problem List Items Addressed This Visit      Respiratory   Allergic rhinitis due to pollen Stable on medications.  Consistent with chronic allergic rhinitis, non-seasonal, unknown triggers. - Today no evidence of acute sinusitis or complication.   Plan: 1. Continue nasal fluticasone 2 sprays each nostril once daily for 4 weeks. 2. Continue antihistamine loratadine 10 mg once daily. 3. Can use nasal saline prn  4. Continue montelukast 5. Follow-up in future, as needed, consider 2nd opinion from ENT/allergy.   Relevant Medications   montelukast (SINGULAIR) 10 MG tablet   fluticasone (FLONASE) 50 MCG/ACT nasal spray     Digestive   GERD (gastroesophageal reflux disease) Currently well controlled on omeprazole once daily.  Plan: 1. Continue omeprazole 20 mg once daily. Side effects discussed. Pt wants to continue med. 2. Avoid diet triggers. Reviewed need to seek care if globus sensation, difficulty swallowing, s/sx of GI bleed. 3. Labs today 4. Follow up as needed and in 6 mos.    Relevant Medications   omeprazole (PRILOSEC) 20 MG capsule  Other Relevant Orders   CBC with Differential/Platelet (Completed)   Comprehensive metabolic panel (Completed)     Nervous and  Auditory   Post herpetic neuralgia Stable today on exam. Pain continues, but is more mild. Head wounds have fully resolved and hair is regrowing slowly.  Medications tolerated without side effects.  Continue at current doses gabapentin for pain control.  Refills provided.   . Followup 6 months.   Relevant Medications   gabapentin (NEURONTIN) 800 MG tablet    Other Visit Diagnoses    Elevated glucose    -  Primary Patient needing follow-up of elevated glucose.  NO prior A1c.  Labs today. Follow-up after labs prn.   Relevant Orders   Hemoglobin A1c (Completed)      Encounter for lipid screening for cardiovascular disease       Relevant Orders   Lipid panel (Completed)      Meds ordered this encounter  Medications  . montelukast (SINGULAIR) 10 MG tablet    Sig: Take 1 tablet (10 mg total) by mouth at bedtime.    Dispense:  90 tablet    Refill:  3  . omeprazole (PRILOSEC) 20 MG capsule    Sig: Take 1 capsule by mouth once daily    Dispense:  90 capsule    Refill:  3  . gabapentin (NEURONTIN) 800 MG tablet    Sig: Take 1 tablet (800 mg total) by mouth 2 (two) times daily.    Dispense:  180 tablet    Refill:  1  . loratadine (CLARITIN) 10 MG tablet    Sig: Take 1 tablet (10 mg total) by mouth daily.    Dispense:  30 tablet    Refill:  11    Order Specific Question:   Supervising Provider    Answer:   Olin Hauser [[id number]]  . fluticasone (FLONASE) 50 MCG/ACT nasal spray    Sig: Place 2 sprays into both nostrils daily.    Dispense:  16 g    Refill:  11    Order Specific Question:   Supervising Provider    Answer:   Olin Hauser [[id number]]   Follow up plan: Return in about 6 months (around 01/11/2019) for GERD, allergic rhinitis.  Cassell Smiles, DNP, AGPCNP-BC Adult Gerontology Primary Care Nurse Practitioner [facility] 07/13/2018, 1:55 PM

## 2018-07-15 ENCOUNTER — Other Ambulatory Visit
Admission: RE | Admit: 2018-07-15 | Discharge: 2018-07-15 | Disposition: A | Discharge status: Home / Self Care | Payer: Medicare Other | Source: Ambulatory Visit | Attending: Nurse Practitioner | Admitting: Nurse Practitioner

## 2018-07-15 DIAGNOSIS — Z136 Encounter for screening for cardiovascular disorders: Secondary | ICD-10-CM | POA: Insufficient documentation

## 2018-07-15 DIAGNOSIS — Z13220 Encounter for screening for lipoid disorders: Secondary | ICD-10-CM | POA: Diagnosis not present

## 2018-07-15 DIAGNOSIS — K219 Gastro-esophageal reflux disease without esophagitis: Secondary | ICD-10-CM | POA: Insufficient documentation

## 2018-07-15 DIAGNOSIS — R7309 Other abnormal glucose: Secondary | ICD-10-CM | POA: Insufficient documentation

## 2018-07-15 LAB — CBC WITH DIFFERENTIAL/PLATELET
Abs Immature Granulocytes: 0.03 10*3/uL (ref 0.00–0.07)
Basophils Absolute: 0.1 10*3/uL (ref 0.0–0.1)
Basophils Relative: 1 %
Eosinophils Absolute: 0.1 10*3/uL (ref 0.0–0.5)
Eosinophils Relative: 1 %
HCT: 39.1 % (ref 36.0–46.0)
Hemoglobin: 12.4 g/dL (ref 12.0–15.0)
Immature Granulocytes: 0 %
Lymphocytes Relative: 72 %
Lymphs Abs: 7.8 10*3/uL — ABNORMAL HIGH (ref 0.7–4.0)
MCH: 26.8 pg (ref 26.0–34.0)
MCHC: 31.7 g/dL (ref 30.0–36.0)
MCV: 84.6 fL (ref 80.0–100.0)
Monocytes Absolute: 0.9 10*3/uL (ref 0.1–1.0)
Monocytes Relative: 9 %
Neutro Abs: 1.8 10*3/uL (ref 1.7–7.7)
Neutrophils Relative %: 17 %
Platelets: 182 10*3/uL (ref 150–400)
RBC: 4.62 MIL/uL (ref 3.87–5.11)
RDW: 14.5 % (ref 11.5–15.5)
WBC: 10.7 10*3/uL — ABNORMAL HIGH (ref 4.0–10.5)
nRBC: 0 % (ref 0.0–0.2)

## 2018-07-15 LAB — COMPREHENSIVE METABOLIC PANEL
ALT: 26 U/L (ref 0–44)
AST: 34 U/L (ref 15–41)
Albumin: 3.8 g/dL (ref 3.5–5.0)
Alkaline Phosphatase: 64 U/L (ref 38–126)
Anion gap: 5 (ref 5–15)
BUN: 20 mg/dL (ref 8–23)
CO2: 25 mmol/L (ref 22–32)
Calcium: 8.9 mg/dL (ref 8.9–10.3)
Chloride: 106 mmol/L (ref 98–111)
Creatinine, Ser: 0.82 mg/dL (ref 0.44–1.00)
GFR calc Af Amer: >60 mL/min (ref >60)
GFR calc non Af Amer: >60 mL/min (ref >60)
Glucose, Bld: 119 mg/dL — ABNORMAL HIGH (ref 70–99)
Potassium: 4.4 mmol/L (ref 3.5–5.1)
Sodium: 136 mmol/L (ref 135–145)
Total Bilirubin: 0.8 mg/dL (ref 0.3–1.2)
Total Protein: 8.8 g/dL — ABNORMAL HIGH (ref 6.5–8.1)

## 2018-07-15 LAB — LIPID PANEL
Cholesterol: 162 mg/dL (ref 0–200)
HDL: 32 mg/dL — ABNORMAL LOW (ref >40)
LDL Cholesterol: 101 mg/dL — ABNORMAL HIGH (ref 0–99)
Total CHOL/HDL Ratio: 5.1 RATIO
Triglycerides: 145 mg/dL (ref <150)
VLDL: 29 mg/dL (ref 0–40)

## 2018-07-15 LAB — HEMOGLOBIN A1C
Hgb A1c MFr Bld: 6.2 % — ABNORMAL HIGH (ref 4.8–5.6)
Mean Plasma Glucose: 131.24 mg/dL

## 2018-07-16 ENCOUNTER — Encounter: Payer: Self-pay | Admitting: Nurse Practitioner

## 2018-07-19 ENCOUNTER — Encounter: Payer: Self-pay | Admitting: Nurse Practitioner

## 2018-07-19 DIAGNOSIS — R7303 Prediabetes: Secondary | ICD-10-CM | POA: Insufficient documentation

## 2018-09-08 ENCOUNTER — Other Ambulatory Visit: Payer: Self-pay

## 2018-09-09 ENCOUNTER — Inpatient Hospital Stay: Payer: Medicare Other | Attending: Hematology and Oncology

## 2018-09-09 DIAGNOSIS — D0511 Intraductal carcinoma in situ of right breast: Secondary | ICD-10-CM | POA: Diagnosis not present

## 2018-09-09 LAB — CBC WITH DIFFERENTIAL/PLATELET
Abs Immature Granulocytes: 0 10*3/uL (ref 0.00–0.07)
Basophils Absolute: 0 10*3/uL (ref 0.0–0.1)
Basophils Relative: 0 %
Eosinophils Absolute: 0.1 10*3/uL (ref 0.0–0.5)
Eosinophils Relative: 1 %
HCT: 38.4 % (ref 36.0–46.0)
Hemoglobin: 12.3 g/dL (ref 12.0–15.0)
Lymphocytes Relative: 86 %
Lymphs Abs: 12.1 10*3/uL — ABNORMAL HIGH (ref 0.7–4.0)
MCH: 27 pg (ref 26.0–34.0)
MCHC: 32 g/dL (ref 30.0–36.0)
MCV: 84.4 fL (ref 80.0–100.0)
Monocytes Absolute: 0.3 10*3/uL (ref 0.1–1.0)
Monocytes Relative: 2 %
Neutro Abs: 1.6 10*3/uL — ABNORMAL LOW (ref 1.7–7.7)
Neutrophils Relative %: 11 %
Platelets: 193 10*3/uL (ref 150–400)
RBC: 4.55 MIL/uL (ref 3.87–5.11)
RDW: 15.6 % — ABNORMAL HIGH (ref 11.5–15.5)
Smear Review: NORMAL
WBC: 14.1 10*3/uL — ABNORMAL HIGH (ref 4.0–10.5)
nRBC: 0 % (ref 0.0–0.2)

## 2018-09-09 LAB — COMPREHENSIVE METABOLIC PANEL
ALT: 29 U/L (ref 0–44)
AST: 34 U/L (ref 15–41)
Albumin: 3.8 g/dL (ref 3.5–5.0)
Alkaline Phosphatase: 82 U/L (ref 38–126)
Anion gap: 3 — ABNORMAL LOW (ref 5–15)
BUN: 17 mg/dL (ref 8–23)
CO2: 27 mmol/L (ref 22–32)
Calcium: 8.5 mg/dL — ABNORMAL LOW (ref 8.9–10.3)
Chloride: 106 mmol/L (ref 98–111)
Creatinine, Ser: 0.81 mg/dL (ref 0.44–1.00)
GFR calc Af Amer: >60 mL/min (ref >60)
GFR calc non Af Amer: >60 mL/min (ref >60)
Glucose, Bld: 115 mg/dL — ABNORMAL HIGH (ref 70–99)
Potassium: 4.4 mmol/L (ref 3.5–5.1)
Sodium: 136 mmol/L (ref 135–145)
Total Bilirubin: 0.6 mg/dL (ref 0.3–1.2)
Total Protein: 8.5 g/dL — ABNORMAL HIGH (ref 6.5–8.1)

## 2018-09-09 LAB — LACTATE DEHYDROGENASE: LDH: 180 U/L (ref 98–192)

## 2018-09-10 ENCOUNTER — Inpatient Hospital Stay (HOSPITAL_BASED_OUTPATIENT_CLINIC_OR_DEPARTMENT_OTHER): Payer: Medicare Other | Admitting: Hematology and Oncology

## 2018-09-10 ENCOUNTER — Other Ambulatory Visit: Payer: Medicare Other

## 2018-09-10 DIAGNOSIS — C9110 Chronic lymphocytic leukemia of B-cell type not having achieved remission: Secondary | ICD-10-CM | POA: Diagnosis not present

## 2018-09-10 DIAGNOSIS — D0511 Intraductal carcinoma in situ of right breast: Secondary | ICD-10-CM

## 2018-09-10 DIAGNOSIS — Z79811 Long term (current) use of aromatase inhibitors: Secondary | ICD-10-CM

## 2018-09-10 MED ORDER — ANASTROZOLE 1 MG PO TABS: 1.0000 mg | ORAL_TABLET | Freq: Every day | ORAL | 0 refills | Status: DC

## 2018-09-10 NOTE — Progress Notes (Signed)
Confirmed name, DOB, and address. Patient is requesting refill on Arimidex. Denies any concerns at this time.

## 2018-09-10 NOTE — Progress Notes (Signed)
[facility]  [address], [address] [place], [place] [address] Phone: [phone]  Fax: [phone]   Telemedicine Office Visit:  09/10/2018  Referring physician: Mikey College, *  I connected with Joanna Phillips on 09/10/2018 at 1:32 PM EDT by videoconferencing and verified that I was speaking with the correct person using 2 identifiers.  The patient was in [place], [place] in her husband's truck.  I discussed the limitations, risk, security and privacy concerns of performing an evaluation and management service by videoconferencing and  the availability of in person appointments.  I also discussed with the patient that there may be a patient responsible charge related to this service.  The patient expressed understanding and agreed to proceed.   Chief Complaint: Joanna Phillips is a 67 y.o. female with chronic lymphocytic leukemia (CLL) and right breast DCIS who is seen for 4 month assessment.  HPI:  The patient was last seen in the medical oncology clinic on 05/07/2018.  At that time, she was doing well.  She denied any symptoms or breast concerns. Exam revealed no adenopathy or hepatosplenomegaly.  Her scalp had healed.  WBC was 9,600 with Watson 2000. Hemoglobin was 11.9, hematocrit 37.0, and platelets 186,000.   Bone density on 06/16/2018 showing a T-score of -0.9 in the left forearm radius.   Symptomatically, she is doing well and denies any side effects of Arimidex.  Her scalp is healed, although she experiences occasional pain; her hair is still growing back. She saw her PCP on 07/13/2018 for seasonal allergies which have not improved.   She currently takes a calcium and vitamin D supplement and performs monthly breast exams. Her next mammogram will be in 12/2018.   Past Medical History:  Diagnosis Date   Arthritis    CLL (chronic lymphocytic leukemia) ([place]) 2011   DCIS (ductal carcinoma in situ) 03/11/2016   GERD (gastroesophageal reflux disease)       Past Surgical History:  Procedure Laterality Date   BREAST BIOPSY Right 02/12/2016   path pending   CHOLECYSTECTOMY  1996   COLONOSCOPY  2013    Family History  Problem Relation Age of Onset   Cancer Father        lung   Diabetes Mother    Breast cancer Maternal Aunt     Social History:  reports that she has never smoked. She has never used smokeless tobacco. She reports that she does not drink alcohol or use drugs.  She retired on 05/26/2017.  She lives in [place].  The patient is alone today.  Participants in the patient's visit and their role in the encounter included the patient and Waymon Budge, RN today.  The intake visit was provided by Waymon Budge, RN.   Allergies:  Allergies  Allergen Reactions   Amoxicillin Hives   Hydrocodone Itching   Sulfamethoxazole-Trimethoprim Nausea Only   Allopurinol Rash    Current Medications: Current Outpatient Medications  Medication Sig Dispense Refill   anastrozole (ARIMIDEX) 1 MG tablet TAKE 1 TABLET BY MOUTH ONCE DAILY 90 tablet 0   aspirin EC 81 MG tablet Take 81 mg by mouth daily.     Biotin 1 MG CAPS Take 1 capsule by mouth daily.      calcium-vitamin D (OSCAL WITH D) 500-200 MG-UNIT TABS tablet Take 1 tablet by mouth daily.      fluticasone (FLONASE) 50 MCG/ACT nasal spray Place 2 sprays into both nostrils daily. 16 g 11   gabapentin (NEURONTIN) 800  MG tablet Take 1 tablet (800 mg total) by mouth 2 (two) times daily. 180 tablet 1   omeprazole (PRILOSEC) 20 MG capsule Take 1 capsule by mouth once daily 90 capsule 3   loratadine (CLARITIN) 10 MG tablet Take 1 tablet (10 mg total) by mouth daily. (Patient not taking: Reported on 09/10/2018) 30 tablet 11   montelukast (SINGULAIR) 10 MG tablet Take 1 tablet (10 mg total) by mouth at bedtime. (Patient not taking: Reported on 09/10/2018) 90 tablet 3   No current facility-administered medications for this visit.     Review of Systems  Constitutional:  Negative.  Negative for chills, fever, malaise/fatigue and weight loss.  HENT: Negative.  Negative for congestion, ear pain, nosebleeds, sinus pain and sore throat.   Eyes: Positive for discharge (watery). Negative for blurred vision, double vision, photophobia and pain.  Respiratory: Negative.  Negative for cough, hemoptysis, sputum production and shortness of breath.   Cardiovascular: Negative.  Negative for chest pain, palpitations, orthopnea and leg swelling.  Gastrointestinal: Negative.  Negative for abdominal pain, blood in stool, constipation, diarrhea, melena, nausea and vomiting.  Genitourinary: Negative.  Negative for dysuria, frequency, hematuria and urgency.  Musculoskeletal: Negative.  Negative for back pain, joint pain and myalgias.  Skin:       Scalp continues to heal from prior zoster infection.  Hair growing in.  Neurological: Negative.  Negative for tingling, sensory change, focal weakness, weakness and headaches.  Endo/Heme/Allergies: Positive for environmental allergies. Does not bruise/bleed easily.  Psychiatric/Behavioral: Negative.  Negative for depression and memory loss. The patient is not nervous/anxious and does not have insomnia.    Performance status:  1  Physical Exam  Constitutional: She is oriented to person, place, and time. She appears well-developed and well-nourished. No distress.  HENT:  Head: Normocephalic and atraumatic.  Wearing a pink cap.  Eyes: Conjunctivae and EOM are normal.  Glasses.  Blue eyes.  Neurological: She is alert and oriented to person, place, and time. She has normal reflexes.  Skin: She is not diaphoretic. No pallor.  Psychiatric: She has a normal mood and affect. Her behavior is normal. Judgment and thought content normal.  Nursing note reviewed.   Appointment on 09/09/2018  Component Date Value Ref Range Status   LDH 09/09/2018 180  98 - 192 U/L Final   Performed at [facility], [address],  [place], [place] [address]   Sodium 09/09/2018 136  135 - 145 mmol/L Final   Potassium 09/09/2018 4.4  3.5 - 5.1 mmol/L Final   Chloride 09/09/2018 106  98 - 111 mmol/L Final   CO2 09/09/2018 27  22 - 32 mmol/L Final   Glucose, Bld 09/09/2018 115* 70 - 99 mg/dL Final   BUN 09/09/2018 17  8 - 23 mg/dL Final   Creatinine, Ser 09/09/2018 0.81  0.44 - 1.00 mg/dL Final   Calcium 09/09/2018 8.5* 8.9 - 10.3 mg/dL Final   Total Protein 09/09/2018 8.5* 6.5 - 8.1 g/dL Final   Albumin 09/09/2018 3.8  3.5 - 5.0 g/dL Final   AST 09/09/2018 34  15 - 41 U/L Final   ALT 09/09/2018 29  0 - 44 U/L Final   Alkaline Phosphatase 09/09/2018 82  38 - 126 U/L Final   Total Bilirubin 09/09/2018 0.6  0.3 - 1.2 mg/dL Final   GFR calc non Af Amer 09/09/2018 >60  >60 mL/min Final   GFR calc Af Amer 09/09/2018 >60  >60 mL/min Final   Anion gap 09/09/2018 3*  5 - 15 Final   Performed at [facility], [address], [place], [place] [address]   WBC 09/09/2018 14.1* 4.0 - 10.5 K/uL Final   RBC 09/09/2018 4.55  3.87 - 5.11 MIL/uL Final   Hemoglobin 09/09/2018 12.3  12.0 - 15.0 g/dL Final   HCT 09/09/2018 38.4  36.0 - 46.0 % Final   MCV 09/09/2018 84.4  80.0 - 100.0 fL Final   MCH 09/09/2018 27.0  26.0 - 34.0 pg Final   MCHC 09/09/2018 32.0  30.0 - 36.0 g/dL Final   RDW 09/09/2018 15.6* 11.5 - 15.5 % Final   Platelets 09/09/2018 193  150 - 400 K/uL Final   nRBC 09/09/2018 0.0  0.0 - 0.2 % Final   Neutrophils Relative % 09/09/2018 11  % Final   Neutro Abs 09/09/2018 1.6* 1.7 - 7.7 K/uL Final   Lymphocytes Relative 09/09/2018 86  % Final   Lymphs Abs 09/09/2018 12.1* 0.7 - 4.0 K/uL Final   Monocytes Relative 09/09/2018 2  % Final   Monocytes Absolute 09/09/2018 0.3  0.1 - 1.0 K/uL Final   Eosinophils Relative 09/09/2018 1  % Final   Eosinophils Absolute 09/09/2018 0.1  0.0 - 0.5 K/uL Final   Basophils Relative 09/09/2018 0  % Final   Basophils Absolute 09/09/2018 0.0  0.0 -  0.1 K/uL Final   Smear Review 09/09/2018 Normal platelet morphology   Final   Abs Immature Granulocytes 09/09/2018 0.00  0.00 - 0.07 K/uL Final   Abnormal Lymphocytes Present 09/09/2018 PRESENT   Final   Smudge Cells 09/09/2018 PRESENT   Final   Stomatocytes 09/09/2018 PRESENT   Final   Performed at [facility], [address], [place], [place] [address]    [facility] Labs: 06/08/2015: hematocrit of 35.1, hemoglobin 11.7, MCV 81, platelets 225,000, WBC 17,200 with an ANC of 3100.  Absolutle lymphocyte count (ALC) 12,200.  Uric acid 8.0 (elevated; last check 7.7- elevated).  Creatinine 0.90.  LDH 217. 12/05/2015: hematocrit of 34.1, hemoglobin 10.9, MCV 81, platelets 213,000, WBC 17,700 with an ANC of 3100.  ALC 13,600.  Uric acid 6.0.  Creatinine 0.81.  LDH 212. 05/14/2016: hematocrit of 34.0, hemoglobin 11.1, MCV 83, platelets 382,000, WBC 13,800 with an ANC of 4600.  ALC 7,700.   06/11/2016: hematocrit of 36.0, hemoglobin 11.5, MCV 82, platelets 213,000, WBC 8,100 with an ANC of 3300.  ALC 3,900.  Creatinine 0.84.  Ferritin 79.  Iron saturation 12%.  Reticulocyte count 1.1%.  Coombs negative. 07/18/2016: hematocrit of 35.6, hemoglobin 11.8, MCV 83, platelets 200,000, WBC 6,900 with an ANC of 2600.  ALC 3,600.  Creatinine 0.78.  LFTs normal. 10/17/2016: hematocrit of 35.9, hemoglobin 11.8, MCV 84, platelets 248,000, WBC 10,500 with an ANC of 3200.  ALC 3,600.  Creatinine 0.93.  LFTs normal. 02/02/2017: hematocrit of 32.2, hemoglobin 10.4, MCV 83, platelets 361,000, WBC 15,700 with an ANC of 7200.  ALC 7,200.  Creatinine 0.84.  LFTs normal.  Albumen 3.5. 05/01/2017: hematocrit of 34.3, hemoglobin 11.1, MCV 79, platelets 281,000, WBC 9,200 with an ANC of 2900.  ALC 5,200.  Creatinine 0.87.  LFTs normal.  Albumin 3.5. B12 was 383. Folate 12.6, Ferritin 125. Reticulocyte count 1.9%.   Assessment:  Joanna Phillips is a 68 y.o. female with stage I chronic lymphocytic leukemia diagnosed  in 2011.  She was diagnosed with right breast DCIS in 02/2016.  CLL:  She presented with mild lymphocytosis and small cervical adenopathy on routine physical exam.  CBC  revealed only lymphocytosis.  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  SPEP was normal.  Chest, abdomen, and pelvic CT scan revealed small diffuse nodes.  DCIS:  She underwent wire-guided right partial mastectomy on 03/11/2016 at [facility].  Pathology revealed a 3.9 cm grade III DCIS.  ER was strongly positive (Allred score 8) in 100% of cells.  PR was strongly positive (Allred score 6) in 30% of cells.  Pathologic stage was TisNx.  She completed breast radiation on 06/25/2016.    She began Arimidex on 06/26/2016.  She is tolerating treatment well.   Bilateral mammogram at [facility] on 12/19/2016 revealed no evidence of malignancy.   There was interval development of post lumpectomy changes.  Bilateral diagnostic mammogram at [facility] on 12/25/2017 revealed no evidence of malignancy.  She has a history of intermittent rectal and vaginal bleeding in 2015.  She was seen by gynecology at the [facility].  Pelvic exam and ultrasound revealed fibroids.   EGD on 11/11/2011 revealed a hiatal hernia and gastritis.  Colonoscopy on 11/11/2011 revealed diverticulosis in the ascending, descending, and sigmoid colon.  Stool guaiacs x 3 were negative in 06/2014.  She denies any melena or hematochezia.   She has a history of varicella zoster involving the right side of her scalp.  She was admitted to [facility] from 12/21/2015 - 12/25/2015 with disseminated varicella zoster.  She was treated with IV acyclovir then switched to oral valacyclovir (completed 01/10/2016).  She is on prophylactic valacyclovir.  Scalp has undergone debridement at the wound care center.  Bone density on 05/21/2016 was normal with a T score of  -0.3 in the AP spine L1-L4 and -0.3 in the left femoral neck. Bone density on 06/16/2018 was normal with a T-score of -0.9 in the left forearm radius.   Symptomatically, she is doing well.  She denies any breast concerns.  She denies any B symptoms.  Plan: 1.   Review labs from 09/09/2018. 2.   Chronic lymphocytic leukemia  Hematocrit 38.4.  Hemoglobin 12.3.  Platelets 193,000.  WBC 14,100 (ANC 1600; ALC 12,100)   Clinically she is doing well.  Continue surveillance. 3.   Right breast DCIS  She denies any breast concerns.  Encourage monthly breast exams.  Breast exam at next in person visit.  Bilateral diagnostic mammogram 12/27/2018 (please obtain old films from [place]).  Continue Arimidex. 4.  Health maintenance  Bone density on 06/16/2018 was normal. 5.  RTC in 4 months for MD assessment, labs (CBC with differential, CMP), and breast exam.  I discussed the assessment and treatment plan with the patient.  The patient was provided an opportunity to ask questions and all were answered.  The patient agreed with the plan and demonstrated an understanding of the instructions.  The patient was advised to call back or seek an in person evaluation if the symptoms worsen or if the condition fails to improve as anticipated.  I provided 14 minutes (1:32 PM - 1:45 PM) of face-to-face video visit time during this this encounter and > 50% was spent counseling as documented under my assessment and plan.  I provided these services from the [facility] office.  Nolon Stalls, MD, PhD 09/10/2018,1:32 PM    I, Molly Dorshimer, am acting as Education administrator for [employer]. Mike Gip, MD, PhD.  I, Sharena Dibenedetto C. Mike Gip, MD, have reviewed the above documentation for accuracy and  completeness, and I agree with the above.

## 2018-11-17 DIAGNOSIS — M542 Cervicalgia: Secondary | ICD-10-CM | POA: Diagnosis not present

## 2018-11-17 DIAGNOSIS — R4189 Other symptoms and signs involving cognitive functions and awareness: Secondary | ICD-10-CM | POA: Diagnosis not present

## 2018-11-17 DIAGNOSIS — C9110 Chronic lymphocytic leukemia of B-cell type not having achieved remission: Secondary | ICD-10-CM | POA: Diagnosis not present

## 2018-11-17 DIAGNOSIS — H539 Unspecified visual disturbance: Secondary | ICD-10-CM | POA: Diagnosis not present

## 2018-11-17 DIAGNOSIS — G8929 Other chronic pain: Secondary | ICD-10-CM | POA: Diagnosis not present

## 2018-11-17 DIAGNOSIS — B0229 Other postherpetic nervous system involvement: Secondary | ICD-10-CM | POA: Diagnosis not present

## 2018-11-17 DIAGNOSIS — M25512 Pain in left shoulder: Secondary | ICD-10-CM | POA: Diagnosis not present

## 2018-12-10 ENCOUNTER — Other Ambulatory Visit: Payer: Self-pay | Admitting: Hematology and Oncology

## 2018-12-10 ENCOUNTER — Encounter: Payer: Self-pay | Admitting: Hematology and Oncology

## 2018-12-10 DIAGNOSIS — D0511 Intraductal carcinoma in situ of right breast: Secondary | ICD-10-CM

## 2018-12-20 DIAGNOSIS — Z1239 Encounter for other screening for malignant neoplasm of breast: Secondary | ICD-10-CM | POA: Diagnosis not present

## 2018-12-20 DIAGNOSIS — C50811 Malignant neoplasm of overlapping sites of right female breast: Secondary | ICD-10-CM | POA: Diagnosis not present

## 2018-12-20 DIAGNOSIS — Z170 Estrogen receptor positive status [ER+]: Secondary | ICD-10-CM | POA: Diagnosis not present

## 2018-12-20 DIAGNOSIS — Z86000 Personal history of in-situ neoplasm of breast: Secondary | ICD-10-CM | POA: Diagnosis not present

## 2019-01-10 ENCOUNTER — Other Ambulatory Visit: Payer: Self-pay | Admitting: Nurse Practitioner

## 2019-01-10 DIAGNOSIS — E039 Hypothyroidism, unspecified: Secondary | ICD-10-CM

## 2019-01-10 DIAGNOSIS — R7303 Prediabetes: Secondary | ICD-10-CM

## 2019-01-10 DIAGNOSIS — E038 Other specified hypothyroidism: Secondary | ICD-10-CM

## 2019-01-11 ENCOUNTER — Ambulatory Visit (INDEPENDENT_AMBULATORY_CARE_PROVIDER_SITE_OTHER): Payer: Medicare Other | Admitting: Nurse Practitioner

## 2019-01-11 ENCOUNTER — Ambulatory Visit (INDEPENDENT_AMBULATORY_CARE_PROVIDER_SITE_OTHER): Payer: Medicare Other

## 2019-01-11 ENCOUNTER — Other Ambulatory Visit: Payer: Self-pay

## 2019-01-11 ENCOUNTER — Encounter: Payer: Self-pay | Admitting: Nurse Practitioner

## 2019-01-11 DIAGNOSIS — B0229 Other postherpetic nervous system involvement: Secondary | ICD-10-CM | POA: Diagnosis not present

## 2019-01-11 DIAGNOSIS — J301 Allergic rhinitis due to pollen: Secondary | ICD-10-CM

## 2019-01-11 DIAGNOSIS — K219 Gastro-esophageal reflux disease without esophagitis: Secondary | ICD-10-CM

## 2019-01-11 DIAGNOSIS — Z0000 Encounter for general adult medical examination without abnormal findings: Secondary | ICD-10-CM

## 2019-01-11 DIAGNOSIS — Z7189 Other specified counseling: Secondary | ICD-10-CM | POA: Diagnosis not present

## 2019-01-11 MED ORDER — FLUTICASONE PROPIONATE 50 MCG/ACT NA SUSP: 2.0000 | Freq: Every day | NASAL | 11 refills | Status: DC

## 2019-01-11 MED ORDER — OMEPRAZOLE 20 MG PO CPDR: DELAYED_RELEASE_CAPSULE | ORAL | 3 refills | Status: AC

## 2019-01-11 MED ORDER — GABAPENTIN 800 MG PO TABS: 1200.0000 mg | ORAL_TABLET | Freq: Two times a day (BID) | ORAL | 1 refills | Status: DC

## 2019-01-11 NOTE — Patient Instructions (Signed)
Joanna Phillips , Thank you for taking time to come for your Medicare Wellness Visit. I appreciate your ongoing commitment to your health goals. Please review the following plan we discussed and let me know if I can assist you in the future.   Screening recommendations/referrals: Colonoscopy: completed 2015 Mammogram: completed 12/20/2018 Bone Density: completed 06/16/2018 Recommended yearly ophthalmology/optometry visit for glaucoma screening and checkup Recommended yearly dental visit for hygiene and checkup  Vaccinations: Influenza vaccine: due 01/2019 Pneumococcal vaccine: due, pt prefers to get at pharmacy  Tdap vaccine: due. Check with your insurance for coverage Shingles vaccine: shingrix completed     Advanced directives: please pick up a copy of this information next time your in the office   Conditions/risks identified: none   Next appointment: Follow up in one year for your annual wellness visit.    Preventive Care 91 Years and Older, Female Preventive care refers to lifestyle choices and visits with your health care provider that can promote health and wellness. What does preventive care include?  A yearly physical exam. This is also called an annual well check.  Dental exams once or twice a year.  Routine eye exams. Ask your health care provider how often you should have your eyes checked.  Personal lifestyle choices, including:  Daily care of your teeth and gums.  Regular physical activity.  Eating a healthy diet.  Avoiding tobacco and drug use.  Limiting alcohol use.  Practicing safe sex.  Taking low-dose aspirin every day.  Taking vitamin and mineral supplements as recommended by your health care provider. What happens during an annual well check? The services and screenings done by your health care provider during your annual well check will depend on your age, overall health, lifestyle risk factors, and family history of disease. Counseling  Your health  care provider may ask you questions about your:  Alcohol use.  Tobacco use.  Drug use.  Emotional well-being.  Home and relationship well-being.  Sexual activity.  Eating habits.  History of falls.  Memory and ability to understand (cognition).  Work and work Statistician.  Reproductive health. Screening  You may have the following tests or measurements:  Height, weight, and BMI.  Blood pressure.  Lipid and cholesterol levels. These may be checked every 5 years, or more frequently if you are over 59 years old.  Skin check.  Lung cancer screening. You may have this screening every year starting at age 55 if you have a 30-pack-year history of smoking and currently smoke or have quit within the past 15 years.  Fecal occult blood test (FOBT) of the stool. You may have this test every year starting at age 36.  Flexible sigmoidoscopy or colonoscopy. You may have a sigmoidoscopy every 5 years or a colonoscopy every 10 years starting at age 5.  Hepatitis C blood test.  Hepatitis B blood test.  Sexually transmitted disease (STD) testing.  Diabetes screening. This is done by checking your blood sugar (glucose) after you have not eaten for a while (fasting). You may have this done every 1-3 years.  Bone density scan. This is done to screen for osteoporosis. You may have this done starting at age 78.  Mammogram. This may be done every 1-2 years. Talk to your health care provider about how often you should have regular mammograms. Talk with your health care provider about your test results, treatment options, and if necessary, the need for more tests. Vaccines  Your health care provider may recommend certain vaccines, such  as:  Influenza vaccine. This is recommended every year.  Tetanus, diphtheria, and acellular pertussis (Tdap, Td) vaccine. You may need a Td booster every 10 years.  Zoster vaccine. You may need this after age 27.  Pneumococcal 13-valent conjugate  (PCV13) vaccine. One dose is recommended after age 19.  Pneumococcal polysaccharide (PPSV23) vaccine. One dose is recommended after age 36. Talk to your health care provider about which screenings and vaccines you need and how often you need them. This information is not intended to replace advice given to you by your health care provider. Make sure you discuss any questions you have with your health care provider. Document Released: 06/01/2015 Document Revised: 01/23/2016 Document Reviewed: 03/06/2015 Elsevier Interactive Patient Education  2017 [employer] Prevention in the Home Falls can cause injuries. They can happen to people of all ages. There are many things you can do to make your home safe and to help prevent falls. What can I do on the outside of my home?  Regularly fix the edges of walkways and driveways and fix any cracks.  Remove anything that might make you trip as you walk through a door, such as a raised step or threshold.  Trim any bushes or trees on the path to your home.  Use bright outdoor lighting.  Clear any walking paths of anything that might make someone trip, such as rocks or tools.  Regularly check to see if handrails are loose or broken. Make sure that both sides of any steps have handrails.  Any raised decks and porches should have guardrails on the edges.  Have any leaves, snow, or ice cleared regularly.  Use sand or salt on walking paths during winter.  Clean up any spills in your garage right away. This includes oil or grease spills. What can I do in the bathroom?  Use night lights.  Install grab bars by the toilet and in the tub and shower. Do not use towel bars as grab bars.  Use non-skid mats or decals in the tub or shower.  If you need to sit down in the shower, use a plastic, non-slip stool.  Keep the floor dry. Clean up any water that spills on the floor as soon as it happens.  Remove soap buildup in the tub or shower  regularly.  Attach bath mats securely with double-sided non-slip rug tape.  Do not have throw rugs and other things on the floor that can make you trip. What can I do in the bedroom?  Use night lights.  Make sure that you have a light by your bed that is easy to reach.  Do not use any sheets or blankets that are too big for your bed. They should not hang down onto the floor.  Have a firm chair that has side arms. You can use this for support while you get dressed.  Do not have throw rugs and other things on the floor that can make you trip. What can I do in the kitchen?  Clean up any spills right away.  Avoid walking on wet floors.  Keep items that you use a lot in easy-to-reach places.  If you need to reach something above you, use a strong step stool that has a grab bar.  Keep electrical cords out of the way.  Do not use floor polish or wax that makes floors slippery. If you must use wax, use non-skid floor wax.  Do not have throw rugs and other things on the  floor that can make you trip. What can I do with my stairs?  Do not leave any items on the stairs.  Make sure that there are handrails on both sides of the stairs and use them. Fix handrails that are broken or loose. Make sure that handrails are as long as the stairways.  Check any carpeting to make sure that it is firmly attached to the stairs. Fix any carpet that is loose or worn.  Avoid having throw rugs at the top or bottom of the stairs. If you do have throw rugs, attach them to the floor with carpet tape.  Make sure that you have a light switch at the top of the stairs and the bottom of the stairs. If you do not have them, ask someone to add them for you. What else can I do to help prevent falls?  Wear shoes that:  Do not have high heels.  Have rubber bottoms.  Are comfortable and fit you well.  Are closed at the toe. Do not wear sandals.  If you use a stepladder:  Make sure that it is fully  opened. Do not climb a closed stepladder.  Make sure that both sides of the stepladder are locked into place.  Ask someone to hold it for you, if possible.  Clearly mark and make sure that you can see:  Any grab bars or handrails.  First and last steps.  Where the edge of each step is.  Use tools that help you move around (mobility aids) if they are needed. These include:  Canes.  Walkers.  Scooters.  Crutches.  Turn on the lights when you go into a dark area. Replace any light bulbs as soon as they burn out.  Set up your furniture so you have a clear path. Avoid moving your furniture around.  If any of your floors are uneven, fix them.  If there are any pets around you, be aware of where they are.  Review your medicines with your doctor. Some medicines can make you feel dizzy. This can increase your chance of falling. Ask your doctor what other things that you can do to help prevent falls. This information is not intended to replace advice given to you by your health care provider. Make sure you discuss any questions you have with your health care provider. Document Released: 03/01/2009 Document Revised: 10/11/2015 Document Reviewed: 06/09/2014 Elsevier Interactive Patient Education  2017 [employer].

## 2019-01-11 NOTE — Progress Notes (Signed)
Telemedicine Encounter: Disclosed to patient at start of encounter that we will provide appropriate telemedicine services.  Patient consents to be treated via phone prior to discussion. - Patient is at her home and is accessed via telephone. - Services are provided by Cassell Smiles from [facility].  Subjective:    Patient ID: Joanna Phillips, female    DOB: 12-Nov-1950, 68 y.o.   MRN: [id number]  Joanna Phillips is a 67 y.o. female presenting on 01/11/2019 for Gastroesophageal Reflux  HPI GERD - She is not currently symptomatic on medication and avoiding triggers. Known triggers are spicy foods and late, large meals.  - Symptoms started years ago. This has been associated with no other symptoms.   - She denies melena, hematochezia, hematemesis, and coffee ground emesis.  - Risk factors present for GERD include obesity.   Neuropathy Focused on right side of head.  Is "much better" at this time.  Patient has seen neurologist Dr Manuella Ghazi and he increased dose to 1200 mg bid (1.5 pills in am and 1.5 in pm).  Patient was also offered nerve block, but has. Most on scalp and right ear.    Seasonal allergies Patient continues to take loratadine and montelukast only when needed during spring.  Patient has stopped taking for this year, but will resume at allergy season next year.  Flonase continues.    Covid-19 advice Patient asks about general COVID-19 precautions - asks if masks are helping, other precautions to stay well  Social History   Tobacco Use  . Smoking status: Never Smoker  . Smokeless tobacco: Never Used  Substance Use Topics  . Alcohol use: No  . Drug use: No    Review of Systems Per HPI unless specifically indicated above     Objective:    There were no vitals taken for this visit.  Wt Readings from Last 3 Encounters:  07/13/18 198 lb 9.6 oz (90.1 kg)  05/07/18 200 lb 12.8 oz (91.1 kg)  02/25/18 (P) 201 lb 9.8 oz (91.4 kg)    Physical Exam Patient  remotely monitored.  Verbal communication appropriate.  Cognition normal.   Results for orders placed or performed in visit on 09/09/18  Lactate dehydrogenase  Result Value Ref Range   LDH 180 98 - 192 U/L  Comprehensive metabolic panel  Result Value Ref Range   Sodium 136 135 - 145 mmol/L   Potassium 4.4 3.5 - 5.1 mmol/L   Chloride 106 98 - 111 mmol/L   CO2 27 22 - 32 mmol/L   Glucose, Bld 115 (H) 70 - 99 mg/dL   BUN 17 8 - 23 mg/dL   Creatinine, Ser 0.81 0.44 - 1.00 mg/dL   Calcium 8.5 (L) 8.9 - 10.3 mg/dL   Total Protein 8.5 (H) 6.5 - 8.1 g/dL   Albumin 3.8 3.5 - 5.0 g/dL   AST 34 15 - 41 U/L   ALT 29 0 - 44 U/L   Alkaline Phosphatase 82 38 - 126 U/L   Total Bilirubin 0.6 0.3 - 1.2 mg/dL   GFR calc non Af Amer >60 >60 mL/min   GFR calc Af Amer >60 >60 mL/min   Anion gap 3 (L) 5 - 15  CBC with Differential  Result Value Ref Range   WBC 14.1 (H) 4.0 - 10.5 K/uL   RBC 4.55 3.87 - 5.11 MIL/uL   Hemoglobin 12.3 12.0 - 15.0 g/dL   HCT 38.4 36.0 - 46.0 %   MCV 84.4 80.0 -  100.0 fL   MCH 27.0 26.0 - 34.0 pg   MCHC 32.0 30.0 - 36.0 g/dL   RDW 15.6 (H) 11.5 - 15.5 %   Platelets 193 150 - 400 K/uL   nRBC 0.0 0.0 - 0.2 %   Neutrophils Relative % 11 %   Neutro Abs 1.6 (L) 1.7 - 7.7 K/uL   Lymphocytes Relative 86 %   Lymphs Abs 12.1 (H) 0.7 - 4.0 K/uL   Monocytes Relative 2 %   Monocytes Absolute 0.3 0.1 - 1.0 K/uL   Eosinophils Relative 1 %   Eosinophils Absolute 0.1 0.0 - 0.5 K/uL   Basophils Relative 0 %   Basophils Absolute 0.0 0.0 - 0.1 K/uL   Smear Review Normal platelet morphology    Abs Immature Granulocytes 0.00 0.00 - 0.07 K/uL   Abnormal Lymphocytes Present PRESENT    Smudge Cells PRESENT    Stomatocytes PRESENT       Assessment & Plan:   Problem List Items Addressed This Visit      Digestive   GERD (gastroesophageal reflux disease) - Primary Stable today on exam. No complications of GI bleeding noted. Medications tolerated without side effects.  Continue  at current doses.  Refills provided.  Check labs today. Followup 6 months.      Nervous and Auditory   Post herpetic neuralgia Stable today on exam. Patient continues to remain symptomatic.  Medications tolerated without side effects.  Continue at current doses.  Refills provided.  Could consider occasional application OTC lidocaine ointment to ear. Followup 6 months.     Other Visit Diagnoses    Advice Given About Covid-19 Virus by Telephone         Patient with questions about masking and effectiveness of masks.  Patient advised that masks are effective at preventing droplet transmission.  Encouraged to continue masking, washing hands, distancing when in public.  Meds ordered this encounter  Medications  . gabapentin (NEURONTIN) 800 MG tablet    Sig: Take 1.5 tablets (1,200 mg total) by mouth 2 (two) times daily.    Dispense:  270 tablet    Refill:  1    Order Specific Question:   Supervising Provider    Answer:   Olin Hauser [[id number]]  . omeprazole (PRILOSEC) 20 MG capsule    Sig: Take 1 capsule by mouth once daily    Dispense:  90 capsule    Refill:  3    Order Specific Question:   Supervising Provider    Answer:   Olin Hauser [[id number]]  . fluticasone (FLONASE) 50 MCG/ACT nasal spray    Sig: Place 2 sprays into both nostrils daily.    Dispense:  16 g    Refill:  11    Order Specific Question:   Supervising Provider    Answer:   Olin Hauser [[id number]]   - Time spent in direct consultation with patient via telemedicine about above concerns: 16 minutes  Follow up plan: Return in about 6 months (around 07/14/2019) for neuropathy, GERD, prediabetes.  Cassell Smiles, DNP, AGPCNP-BC Adult Gerontology Primary Care Nurse Practitioner [facility] 01/11/2019, 10:03 AM

## 2019-01-11 NOTE — Progress Notes (Signed)
Subjective:   Joanna Phillips is a 68 y.o. female who presents for Medicare Annual (Initial) preventive examination.  This visit is being conducted via phone call  - after an attmept to do on video chat - due to the COVID-19 pandemic. This patient has given me verbal consent via phone to conduct this visit, patient states they are participating from their home address. Some vital signs may be absent or patient reported.   Patient identification: identified by name, DOcomplet, and current address.    Review of Systems:   Cardiac Risk Factors include: advanced age (>33men, >73 women);hypertension;dyslipidemia     Objective:     Vitals: There were no vitals taken for this visit.  There is no height or weight on file to calculate BMI.  Advanced Directives 01/11/2019 09/10/2018 05/07/2018 11/05/2017 08/06/2017 05/07/2017 02/05/2017  Does Patient Have a Medical Advance Directive? No No No No Yes No No  Type of Advance Directive - - - - - - Press photographer;Living will  Does patient want to make changes to medical advance directive? - - - - No - Patient declined - -  Would patient like information on creating a medical advance directive? - - No - Patient declined - - - -    Tobacco Social History   Tobacco Use  Smoking Status Never Smoker  Smokeless Tobacco Never Used     Counseling given: Not Answered   Clinical Intake:  Pre-visit preparation completed: Yes  Pain : 0-10 Pain Score: 6  Pain Type: Neuropathic pain, Acute pain Pain Location: Head Pain Orientation: Right Pain Descriptors / Indicators: Tingling, Burning Pain Frequency: Intermittent     Nutritional Risks: None Diabetes: No  How often do you need to have someone help you when you read instructions, pamphlets, or other written materials from your doctor or pharmacy?: 1 - Never  Interpreter Needed?: No  Information entered by :: Tiffany Hill,LPN  Past Medical History:  Diagnosis Date  . Arthritis    . CLL (chronic lymphocytic leukemia) ([place]) 2011  . DCIS (ductal carcinoma in situ) 03/11/2016  . GERD (gastroesophageal reflux disease)    Past Surgical History:  Procedure Laterality Date  . BREAST BIOPSY Right 02/12/2016   path pending  . CHOLECYSTECTOMY  1996  . COLONOSCOPY  2013   Family History  Problem Relation Age of Onset  . Cancer Father        lung  . Diabetes Mother   . Breast cancer Maternal Aunt    Social History   Socioeconomic History  . Marital status: Married    Spouse name: Not on file  . Number of children: Not on file  . Years of education: Not on file  . Highest education level: Not on file  Occupational History  . Occupation: retired   Scientific laboratory technician  . Financial resource strain: Not hard at all  . Food insecurity    Worry: Never true    Inability: Never true  . Transportation needs    Medical: No    Non-medical: No  Tobacco Use  . Smoking status: Never Smoker  . Smokeless tobacco: Never Used  Substance and Sexual Activity  . Alcohol use: No  . Drug use: No  . Sexual activity: Not on file  Lifestyle  . Physical activity    Days per week: 3 days    Minutes per session: 30 min  . Stress: Not at all  Relationships  . Social Herbalist on  phone: More than three times a week    Gets together: More than three times a week    Attends religious service: More than 4 times per year    Active member of club or organization: No    Attends meetings of clubs or organizations: Never    Relationship status: Married  Other Topics Concern  . Not on file  Social History Narrative  . Not on file    Outpatient Encounter Medications as of 01/11/2019  Medication Sig  . anastrozole (ARIMIDEX) 1 MG tablet Take 1 tablet by mouth once daily  . aspirin EC 81 MG tablet Take 81 mg by mouth daily.  . Biotin 1 MG CAPS Take 1 capsule by mouth daily.   . calcium-vitamin D (OSCAL WITH D) 500-200 MG-UNIT TABS tablet Take 1 tablet by mouth daily.   .  fluticasone (FLONASE) 50 MCG/ACT nasal spray Place 2 sprays into both nostrils daily.  Marland Kitchen gabapentin (NEURONTIN) 800 MG tablet Take 1.5 tablets (1,200 mg total) by mouth 2 (two) times daily.  Marland Kitchen omeprazole (PRILOSEC) 20 MG capsule Take 1 capsule by mouth once daily  . [DISCONTINUED] anastrozole (ARIMIDEX) 1 MG tablet Take 1 tablet (1 mg total) by mouth daily.  . [DISCONTINUED] fluticasone (FLONASE) 50 MCG/ACT nasal spray Place 2 sprays into both nostrils daily.  . [DISCONTINUED] gabapentin (NEURONTIN) 800 MG tablet Take 1 tablet (800 mg total) by mouth 2 (two) times daily. (Patient taking differently: Take 1,200 mg by mouth 2 (two) times daily. )  . [DISCONTINUED] loratadine (CLARITIN) 10 MG tablet Take 1 tablet (10 mg total) by mouth daily. (Patient not taking: Reported on 01/11/2019)  . [DISCONTINUED] montelukast (SINGULAIR) 10 MG tablet Take 1 tablet (10 mg total) by mouth at bedtime. (Patient not taking: Reported on 09/10/2018)  . [DISCONTINUED] omeprazole (PRILOSEC) 20 MG capsule Take 1 capsule by mouth once daily   No facility-administered encounter medications on file as of 01/11/2019.     Activities of Daily Living In your present state of health, do you have any difficulty performing the following activities: 01/11/2019 01/11/2019  Hearing? N Y  Comment no hearing aids -  Vision? Y Y  Comment eyeglasses, my eye dr -  Difficulty concentrating or making decisions? Y Y  Comment yes -  Walking or climbing stairs? N N  Dressing or bathing? N N  Doing errands, shopping? N N  Preparing Food and eating ? N -  Using the Toilet? N -  In the past six months, have you accidently leaked urine? N -  Do you have problems with loss of bowel control? N -  Managing your Medications? N -  Managing your Finances? N -  Housekeeping or managing your Housekeeping? N -  Some recent data might be hidden    Patient Care Team: Mikey College, NP as PCP - General (Nurse Practitioner) Christene Lye, MD (General Surgery) Schermerhorn, Gwen Her, MD as Referring Physician (Obstetrics and Gynecology) Silvio Pate Nona Dell, MD as Referring Physician (General Surgery)    Assessment:   This is a routine wellness examination for Joanna Phillips.  Exercise Activities and Dietary recommendations Current Exercise Habits: Home exercise routine, Type of exercise: Other - see comments(swimming or stationary bike), Time (Minutes): 30, Frequency (Times/Week): 3, Weekly Exercise (Minutes/Week): 90, Intensity: Mild, Exercise limited by: None identified  Goals   None     Fall Risk: Fall Risk  01/11/2019 04/05/2018 01/22/2017 04/03/2016 06/28/2015  Falls in the past year? 0 0 No No Yes  Comment - Emmi Telephone Survey: data to providers prior to load - - Vertigo  Injury with Fall? - - - - No    FALL RISK PREVENTION PERTAINING TO THE HOME:  Any stairs in or around the home? No  If so, are there any without handrails? No   Home free of loose throw rugs in walkways, pet beds, electrical cords, etc? Yes  Adequate lighting in your home to reduce risk of falls? Yes   ASSISTIVE DEVICES UTILIZED TO PREVENT FALLS:  Life alert? No  Use of a cane, walker or w/c? No  Grab bars in the bathroom? No  Shower chair or bench in shower? No  Elevated toilet seat or a handicapped toilet? No   DME ORDERS:  DME order needed?  No   TIMED UP AND GO:  Unable to perform    Depression Screen PHQ 2/9 Scores 01/11/2019 01/11/2019 01/22/2017 04/03/2016  PHQ - 2 Score 0 0 0 0     Cognitive Function        Immunization History  Administered Date(s) Administered  . Influenza, High Dose Seasonal PF 02/13/2016, 03/18/2017, 02/20/2018  . Influenza-Unspecified 03/18/2017  . Pneumococcal Conjugate-13 10/05/2017  . Zoster Recombinat (Shingrix) 04/17/2018, 07/13/2018    Qualifies for Shingles Vaccine? Yes  shingrix completed  Tdap: Discussed need for TD/TDAP vaccine, patient verbalized understanding that  this is not covered as a preventative with there insurance and to call the office if she develops any new skin injuries, ie: cuts, scrapes, bug bites, or open wounds.  Flu Vaccine: Due 01/2019  Pneumococcal Vaccine: Due for Pneumococcal vaccine. Does the patient want to receive this vaccine today? Patient requests to get at pharmacy  Screening Tests Health Maintenance  Topic Date Due  . Hepatitis C Screening  1950/11/14  . PNA vac Low Risk Adult (2 of 2 - PPSV23) 10/06/2018  . INFLUENZA VACCINE  12/18/2018  . TETANUS/TDAP  01/11/2020 (Originally 05/25/1969)  . MAMMOGRAM  12/19/2020  . COLONOSCOPY  05/29/2023  . DEXA SCAN  Completed    Cancer Screenings:  Colorectal Screening: Completed 05/28/2013. Repeat every 10 years  Mammogram: Completed 12/20/2018. Repeat every year  Bone Density: Completed 06/16/2018,  .  Lung Cancer Screening: (Low Dose CT Chest recommended if Age 46-80 years, 30 pack-year currently smoking OR have quit w/in 15years.) does not qualify.  .  Additional Screening:  Hepatitis C Screening: does qualify  Vision Screening: Recommended annual ophthalmology exams for early detection of glaucoma and other disorders of the eye. Is the patient up to date with their annual eye exam?  Yes  Who is the provider or what is the name of the office in which the pt attends annual eye exams? My eye dr   Dental Screening: Recommended annual dental exams for proper oral hygiene  Community Resource Referral:  CRR required this visit?  No       Plan:  I have personally reviewed and addressed the Medicare Annual Wellness questionnaire and have noted the following in the patient's chart:  A. Medical and social history B. Use of alcohol, tobacco or illicit drugs  C. Current medications and supplements D. Functional ability and status E.  Nutritional status F.  Physical activity G. Advance directives H. List of other physicians I.  Hospitalizations, surgeries, and ER visits in  previous 12 months J.  [place] such as hearing and vision if needed, cognitive and depression L. Referrals and appointments   In addition, I have reviewed and discussed with  patient certain preventive protocols, quality metrics, and best practice recommendations. A written personalized care plan for preventive services as well as general preventive health recommendations were provided to patient.  Signed,    Bevelyn Ngo, LPN  [id number] Nurse Health Advisor   Nurse Notes: none

## 2019-01-11 NOTE — Progress Notes (Signed)
[facility]  [address], [address] [place], [place] [address] Phone: [phone]  Fax: [phone]   Clinic Day:  01/13/2019  Referring physician: Mikey College, *  Chief Complaint: Joanna Phillips is a 68 y.o. female with chronic lymphocytic leukemia (CLL) and right breast DCIS who is seen for 4 month assessment.   HPI: The patient was last seen in the medical oncology clinic on 09/10/2018. At that time, she was doing well. She denied any breast concerns. She denied any B symptoms. Hematocrit was 38.4. Hemoglobin was 12.3. Platelets were 193,000. WBC 14,100 (ANC 1600; ALC 12,100). She continued Arimidex.   Bilateral mammogram and limited right breast ultrasound at [facility] on 12/20/2018 revealed no mammographic or sonographic evidence of malignancy.  During the interim, the patient feels "good". She reports no complaints today. She denies any infections. She is still on Arimidex. She examines her breast monthly.    Past Medical History:  Diagnosis Date   Arthritis    CLL (chronic lymphocytic leukemia) ([place]) 2011   DCIS (ductal carcinoma in situ) 03/11/2016   GERD (gastroesophageal reflux disease)     Past Surgical History:  Procedure Laterality Date   BREAST BIOPSY Right 02/12/2016   path pending   CHOLECYSTECTOMY  1996   COLONOSCOPY  2013    Family History  Problem Relation Age of Onset   Cancer Father        lung   Diabetes Mother    Breast cancer Maternal Aunt     Social History:  reports that she has never smoked. She has never used smokeless tobacco. She reports that she does not drink alcohol or use drugs. She retired on 05/26/2017.  She lives in [place].The patient is alone today.  Allergies:  Allergies  Allergen Reactions   Amoxicillin Hives   Hydrocodone Itching   Sulfamethoxazole-Trimethoprim Nausea Only   Allopurinol Rash    Current Medications: Current Outpatient Medications  Medication Sig Dispense Refill     anastrozole (ARIMIDEX) 1 MG tablet Take 1 tablet (1 mg total) by mouth daily. 90 tablet 0   aspirin EC 81 MG tablet Take 81 mg by mouth daily.     calcium-vitamin D (OSCAL WITH D) 500-200 MG-UNIT TABS tablet Take 1 tablet by mouth daily.      fluticasone (FLONASE) 50 MCG/ACT nasal spray Place 2 sprays into both nostrils daily. (Patient taking differently: Place 2 sprays into both nostrils as needed. ) 16 g 11   gabapentin (NEURONTIN) 800 MG tablet Take 1.5 tablets (1,200 mg total) by mouth 2 (two) times daily. 270 tablet 1   omeprazole (PRILOSEC) 20 MG capsule Take 1 capsule by mouth once daily 90 capsule 3   Biotin 1 MG CAPS Take 1 capsule by mouth daily.      No current facility-administered medications for this visit.     Review of Systems  Constitutional: Negative.  Negative for chills, fever, malaise/fatigue and weight loss.       Feels "good."  HENT: Negative.  Negative for congestion, ear pain, nosebleeds, sinus pain and sore throat.   Eyes: Negative.  Negative for blurred vision, double vision, photophobia, pain and discharge.  Respiratory: Negative.  Negative for cough, hemoptysis, sputum production and shortness of breath.   Cardiovascular: Negative.  Negative for chest pain, palpitations, orthopnea and leg swelling.  Gastrointestinal: Negative.  Negative for abdominal pain, blood in stool, constipation, diarrhea, melena, nausea and vomiting.  Genitourinary: Negative.  Negative for dysuria, frequency, hematuria and urgency.  Musculoskeletal: Negative.  Negative for back pain, joint pain and myalgias.  Skin: Negative.        Scalp healed from prior zoster infection.  Hair growing in.  Neurological: Negative.  Negative for tingling, sensory change, focal weakness, weakness and headaches.  Endo/Heme/Allergies: Negative.  Negative for environmental allergies. Does not bruise/bleed easily.  Psychiatric/Behavioral: Negative.  Negative for depression and memory loss. The patient  is not nervous/anxious and does not have insomnia.   All other systems reviewed and are negative.  Performance status (ECOG): 1  Vitals Blood pressure 121/70, pulse 80, temperature (!) 97.5 F (36.4 C), temperature source Tympanic, resp. rate 18, SpO2 100 %.  Physical Exam  Constitutional: She is oriented to person, place, and time. She appears well-developed and well-nourished. No distress.  HENT:  Head: Normocephalic and atraumatic.  Mouth/Throat: No oropharyngeal exudate.  Short gray hair.   Eyes: Pupils are equal, round, and reactive to light. Conjunctivae and EOM are normal. No scleral icterus.  Glasses.  Blue eyes.  Neck: Normal range of motion. Neck supple. No JVD present.  Cardiovascular: Normal rate, regular rhythm and normal heart sounds.  No murmur heard. Pulmonary/Chest: Effort normal and breath sounds normal. No respiratory distress. She has no wheezes. She has no rales. She exhibits no tenderness. Left breast exhibits no skin change (fibrocystic changes).  Abdominal: Soft. Bowel sounds are normal. She exhibits no mass. There is no abdominal tenderness. There is no rebound and no guarding.  Musculoskeletal: Normal range of motion.        General: No tenderness or edema.  Lymphadenopathy:    She has no cervical adenopathy.    She has no axillary adenopathy.       Right: No supraclavicular adenopathy present.       Left: No supraclavicular adenopathy present.  Neurological: She is alert and oriented to person, place, and time. She has normal reflexes.  Skin: Skin is warm and dry. No rash noted. She is not diaphoretic. No erythema. No pallor.  Scalp healed.  Psychiatric: She has a normal mood and affect. Her behavior is normal. Judgment and thought content normal.  Nursing note and vitals reviewed.   Appointment on 01/13/2019  Component Date Value Ref Range Status   Sodium 01/13/2019 133* 135 - 145 mmol/L Final   Potassium 01/13/2019 4.2  3.5 - 5.1 mmol/L Final    Chloride 01/13/2019 101  98 - 111 mmol/L Final   CO2 01/13/2019 23  22 - 32 mmol/L Final   Glucose, Bld 01/13/2019 111* 70 - 99 mg/dL Final   BUN 01/13/2019 19  8 - 23 mg/dL Final   Creatinine, Ser 01/13/2019 0.82  0.44 - 1.00 mg/dL Final   Calcium 01/13/2019 8.9  8.9 - 10.3 mg/dL Final   Total Protein 01/13/2019 8.7* 6.5 - 8.1 g/dL Final   Albumin 01/13/2019 3.9  3.5 - 5.0 g/dL Final   AST 01/13/2019 33  15 - 41 U/L Final   ALT 01/13/2019 25  0 - 44 U/L Final   Alkaline Phosphatase 01/13/2019 79  38 - 126 U/L Final   Total Bilirubin 01/13/2019 0.6  0.3 - 1.2 mg/dL Final   GFR calc non Af Amer 01/13/2019 >60  >60 mL/min Final   GFR calc Af Amer 01/13/2019 >60  >60 mL/min Final   Anion gap 01/13/2019 9  5 - 15 Final   Performed at [facility], [address] [place], [place] [address]   WBC 01/13/2019 12.8* 4.0 - 10.5 K/uL  Final   RBC 01/13/2019 4.65  3.87 - 5.11 MIL/uL Final   Hemoglobin 01/13/2019 12.1  12.0 - 15.0 g/dL Final   HCT 01/13/2019 38.2  36.0 - 46.0 % Final   MCV 01/13/2019 82.2  80.0 - 100.0 fL Final   MCH 01/13/2019 26.0  26.0 - 34.0 pg Final   MCHC 01/13/2019 31.7  30.0 - 36.0 g/dL Final   RDW 01/13/2019 15.7* 11.5 - 15.5 % Final   Platelets 01/13/2019 170  150 - 400 K/uL Final   nRBC 01/13/2019 0.0  0.0 - 0.2 % Final   Neutrophils Relative % 01/13/2019 17  % Final   Neutro Abs 01/13/2019 2.2  1.7 - 7.7 K/uL Final   Lymphocytes Relative 01/13/2019 73  % Final   Lymphs Abs 01/13/2019 9.4* 0.7 - 4.0 K/uL Final   Monocytes Relative 01/13/2019 8  % Final   Monocytes Absolute 01/13/2019 1.0  0.1 - 1.0 K/uL Final   Eosinophils Relative 01/13/2019 1  % Final   Eosinophils Absolute 01/13/2019 0.1  0.0 - 0.5 K/uL Final   Basophils Relative 01/13/2019 1  % Final   Basophils Absolute 01/13/2019 0.1  0.0 - 0.1 K/uL Final   RBC Morphology 01/13/2019 MORPHOLOGY UNREMARKABLE   Final   Smear Review 01/13/2019 MORPHOLOGY  UNREMARKABLE   Final   Immature Granulocytes 01/13/2019 0  % Final   Abs Immature Granulocytes 01/13/2019 0.02  0.00 - 0.07 K/uL Final   Reactive, Benign Lymphocytes 01/13/2019 PRESENT   Final   Performed at [facility], [address] [place], [place] [address]   [facility] Labs: 06/08/2015: hematocrit of 35.1, hemoglobin 11.7, MCV 81, platelets 225,000, WBC 17,200 with an ANC of 3100.  Absolutle lymphocyte count (ALC) 12,200.  Uric acid 8.0 (elevated; last check 7.7- elevated).  Creatinine 0.90.  LDH 217. 12/05/2015: hematocrit of 34.1, hemoglobin 10.9, MCV 81, platelets 213,000, WBC 17,700 with an ANC of 3100.  ALC 13,600.  Uric acid 6.0.  Creatinine 0.81.  LDH 212. 05/14/2016: hematocrit of 34.0, hemoglobin 11.1, MCV 83, platelets 382,000, WBC 13,800 with an ANC of 4600.  ALC 7,700.   06/11/2016: hematocrit of 36.0, hemoglobin 11.5, MCV 82, platelets 213,000, WBC 8,100 with an ANC of 3300.  ALC 3,900.  Creatinine 0.84.  Ferritin 79.  Iron saturation 12%.  Reticulocyte count 1.1%.  Coombs negative. 07/18/2016: hematocrit of 35.6, hemoglobin 11.8, MCV 83, platelets 200,000, WBC 6,900 with an ANC of 2600.  ALC 3,600.  Creatinine 0.78.  LFTs normal. 10/17/2016: hematocrit of 35.9, hemoglobin 11.8, MCV 84, platelets 248,000, WBC 10,500 with an ANC of 3200.  ALC 3,600.  Creatinine 0.93.  LFTs normal. 02/02/2017: hematocrit of 32.2, hemoglobin 10.4, MCV 83, platelets 361,000, WBC 15,700 with an ANC of 7200.  ALC 7,200.  Creatinine 0.84.  LFTs normal.  Albumen 3.5. 05/01/2017:hematocrit of34.3, hemoglobin 11.1, MCV79, platelets281,000, WBC9,200 with an Carey of2900. ALC 5,200. Creatinine 0.87. LFTs normal. Albumin 3.5.B12 was 383. Folate 12.6, Ferritin 125. Reticulocyte count 1.9%.  Assessment:  Joanna Phillips is a 68 y.o. female with stage I chronic lymphocytic leukemia diagnosed in 2011.  She was diagnosed with right breast DCIS in 02/2016.  CLL:  She presented with mild  lymphocytosis and small cervical adenopathy on routine physical exam.  CBC revealed only lymphocytosis.  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  SPEP was normal.  Chest, abdomen, and pelvic CT scan revealed small diffuse nodes.  DCIS:  She underwent wire-guided right partial mastectomy on 03/11/2016 at [facility].  Pathology revealed a 3.9 cm grade III DCIS.  ER was strongly positive (Allred score 8) in 100% of cells.  PR was strongly positive (Allred score 6) in 30% of cells.  Pathologic stage was TisNx.  She completed breast radiation on 06/25/2016.    She began Arimidex on 06/26/2016.  She is tolerating treatment well.   Bilateral mammogram at [facility] on 12/19/2016 revealed no evidence of malignancy.   There was interval development of post lumpectomy changes.  Bilateral diagnostic mammogram at [facility] on 12/25/2017 revealed no evidence of malignancy.  Bilateral mammogram and limited right breast ultrasound at [facility] on 12/20/2018 revealed no mammographic or sonographic evidence of malignancy.  She has a history of intermittent rectal and vaginal bleeding in 2015.  She was seen by gynecology at the [facility]  Pelvic exam and ultrasound revealed fibroids.   EGD on 11/11/2011 revealed a hiatal hernia and gastritis.  Colonoscopy on 11/11/2011 revealed diverticulosis in the ascending, descending, and sigmoid colon.  Stool guaiacs x 3 were negative in 06/2014.  She denies any melena or hematochezia.   She has a history of varicella zoster involving the right side of her scalp.  She was admitted to [facility] from 12/21/2015 - 12/25/2015 with disseminated varicella zoster.  She was treated with IV acyclovir then switched to oral valacyclovir (completed 01/10/2016).  She is on prophylactic valacyclovir.  Scalp has undergone debridement at the wound care center.  Bone  density on 05/21/2016 was normal with a T score of -0.3 in the AP spine L1-L4 and -0.3 in the left femoral neck. Bone density on 06/16/2018 was normal with a T-score of -0.9 in the left forearm radius.   Symptomatically, she is doing well.  Exam is unremarkable.  Plan: 1.   Labs today:  CBC with diff, CMP. 2.   Chronic lymphocytic leukemia             Hematocrit 38.2.  Hemoglobin 12.1.  Platelets 170,000.  WBC 12,800 (ANC 2200; ALC 9,400)              Clinically, she continues to do well.    Continue surveillance.   3.   Right breast DCIS             Exam reveals no evidence of recurrent disease.    Bilateral mammogram and right sided ultrasound on 12/20/2018 revealed no evidence of recurrent disease  Continue monthly breast exams.               Continue Arimidex.   4.  Health maintenance             Bone density on 06/16/2018 was normal.  Next bone density in 05/2020. 5.    RTC in 6 months for MD assessment, labs (CBC with diff, CMP) and breast exam.  I discussed the assessment and treatment plan with the patient.  The patient was provided an opportunity to ask questions and all were answered.  The patient agreed with the plan and demonstrated an understanding of the instructions.  The patient was advised to call back if the symptoms worsen or if the condition fails to improve as anticipated.   Lequita Asal, MD, PhD    01/13/2019, 2:08 PM  I, Selena Batten, am acting as scribe for [employer]. Mike Gip, MD, PhD.  I, Jaekwon Mcclune C. Mike Gip, MD, have reviewed the  above documentation for accuracy and completeness, and I agree with the above.

## 2019-01-12 ENCOUNTER — Other Ambulatory Visit: Payer: Self-pay

## 2019-01-12 DIAGNOSIS — D0511 Intraductal carcinoma in situ of right breast: Secondary | ICD-10-CM

## 2019-01-12 MED ORDER — ANASTROZOLE 1 MG PO TABS: 1.0000 mg | ORAL_TABLET | Freq: Every day | ORAL | 0 refills | Status: DC

## 2019-01-12 NOTE — Progress Notes (Signed)
Confirmed Name, DOB, and Address. Requesting refill for Arimidex. Denies any concerns.

## 2019-01-13 ENCOUNTER — Other Ambulatory Visit: Payer: Self-pay

## 2019-01-13 ENCOUNTER — Inpatient Hospital Stay: Payer: Medicare Other

## 2019-01-13 ENCOUNTER — Inpatient Hospital Stay: Payer: Medicare Other | Attending: Hematology and Oncology | Admitting: Hematology and Oncology

## 2019-01-13 ENCOUNTER — Encounter: Payer: Self-pay | Admitting: Hematology and Oncology

## 2019-01-13 VITALS — BP 121/70 | HR 80 | Temp 97.5°F | Resp 18 | Ht 65.0 in | Wt 205.0 lb

## 2019-01-13 DIAGNOSIS — Z170 Estrogen receptor positive status [ER+]: Secondary | ICD-10-CM | POA: Insufficient documentation

## 2019-01-13 DIAGNOSIS — Z79899 Other long term (current) drug therapy: Secondary | ICD-10-CM | POA: Insufficient documentation

## 2019-01-13 DIAGNOSIS — R590 Localized enlarged lymph nodes: Secondary | ICD-10-CM | POA: Diagnosis not present

## 2019-01-13 DIAGNOSIS — Z885 Allergy status to narcotic agent status: Secondary | ICD-10-CM | POA: Insufficient documentation

## 2019-01-13 DIAGNOSIS — D72820 Lymphocytosis (symptomatic): Secondary | ICD-10-CM | POA: Insufficient documentation

## 2019-01-13 DIAGNOSIS — D0511 Intraductal carcinoma in situ of right breast: Secondary | ICD-10-CM | POA: Diagnosis not present

## 2019-01-13 DIAGNOSIS — Z801 Family history of malignant neoplasm of trachea, bronchus and lung: Secondary | ICD-10-CM | POA: Diagnosis not present

## 2019-01-13 DIAGNOSIS — Z881 Allergy status to other antibiotic agents status: Secondary | ICD-10-CM | POA: Diagnosis not present

## 2019-01-13 DIAGNOSIS — Z833 Family history of diabetes mellitus: Secondary | ICD-10-CM | POA: Diagnosis not present

## 2019-01-13 DIAGNOSIS — Z803 Family history of malignant neoplasm of breast: Secondary | ICD-10-CM | POA: Insufficient documentation

## 2019-01-13 DIAGNOSIS — Z79811 Long term (current) use of aromatase inhibitors: Secondary | ICD-10-CM | POA: Insufficient documentation

## 2019-01-13 DIAGNOSIS — Z880 Allergy status to penicillin: Secondary | ICD-10-CM | POA: Diagnosis not present

## 2019-01-13 DIAGNOSIS — C9110 Chronic lymphocytic leukemia of B-cell type not having achieved remission: Secondary | ICD-10-CM | POA: Diagnosis not present

## 2019-01-13 DIAGNOSIS — M1990 Unspecified osteoarthritis, unspecified site: Secondary | ICD-10-CM | POA: Insufficient documentation

## 2019-01-13 DIAGNOSIS — K219 Gastro-esophageal reflux disease without esophagitis: Secondary | ICD-10-CM | POA: Diagnosis not present

## 2019-01-13 LAB — CBC WITH DIFFERENTIAL/PLATELET
Abs Immature Granulocytes: 0.02 10*3/uL (ref 0.00–0.07)
Basophils Absolute: 0.1 10*3/uL (ref 0.0–0.1)
Basophils Relative: 1 %
Eosinophils Absolute: 0.1 10*3/uL (ref 0.0–0.5)
Eosinophils Relative: 1 %
HCT: 38.2 % (ref 36.0–46.0)
Hemoglobin: 12.1 g/dL (ref 12.0–15.0)
Immature Granulocytes: 0 %
Lymphocytes Relative: 73 %
Lymphs Abs: 9.4 10*3/uL — ABNORMAL HIGH (ref 0.7–4.0)
MCH: 26 pg (ref 26.0–34.0)
MCHC: 31.7 g/dL (ref 30.0–36.0)
MCV: 82.2 fL (ref 80.0–100.0)
Monocytes Absolute: 1 10*3/uL (ref 0.1–1.0)
Monocytes Relative: 8 %
Neutro Abs: 2.2 10*3/uL (ref 1.7–7.7)
Neutrophils Relative %: 17 %
Platelets: 170 10*3/uL (ref 150–400)
RBC: 4.65 MIL/uL (ref 3.87–5.11)
RDW: 15.7 % — ABNORMAL HIGH (ref 11.5–15.5)
WBC: 12.8 10*3/uL — ABNORMAL HIGH (ref 4.0–10.5)
nRBC: 0 % (ref 0.0–0.2)

## 2019-01-13 LAB — COMPREHENSIVE METABOLIC PANEL
ALT: 25 U/L (ref 0–44)
AST: 33 U/L (ref 15–41)
Albumin: 3.9 g/dL (ref 3.5–5.0)
Alkaline Phosphatase: 79 U/L (ref 38–126)
Anion gap: 9 (ref 5–15)
BUN: 19 mg/dL (ref 8–23)
CO2: 23 mmol/L (ref 22–32)
Calcium: 8.9 mg/dL (ref 8.9–10.3)
Chloride: 101 mmol/L (ref 98–111)
Creatinine, Ser: 0.82 mg/dL (ref 0.44–1.00)
GFR calc Af Amer: >60 mL/min (ref >60)
GFR calc non Af Amer: >60 mL/min (ref >60)
Glucose, Bld: 111 mg/dL — ABNORMAL HIGH (ref 70–99)
Potassium: 4.2 mmol/L (ref 3.5–5.1)
Sodium: 133 mmol/L — ABNORMAL LOW (ref 135–145)
Total Bilirubin: 0.6 mg/dL (ref 0.3–1.2)
Total Protein: 8.7 g/dL — ABNORMAL HIGH (ref 6.5–8.1)

## 2019-01-13 NOTE — Progress Notes (Signed)
Patient states she has been having cramps in bilateral feet

## 2019-03-04 DIAGNOSIS — W11XXXA Fall on and from ladder, initial encounter: Secondary | ICD-10-CM | POA: Diagnosis not present

## 2019-03-04 DIAGNOSIS — S99921A Unspecified injury of right foot, initial encounter: Secondary | ICD-10-CM | POA: Diagnosis not present

## 2019-03-04 DIAGNOSIS — M7989 Other specified soft tissue disorders: Secondary | ICD-10-CM | POA: Diagnosis not present

## 2019-03-09 DIAGNOSIS — S92321A Displaced fracture of second metatarsal bone, right foot, initial encounter for closed fracture: Secondary | ICD-10-CM | POA: Diagnosis not present

## 2019-03-16 ENCOUNTER — Ambulatory Visit: Payer: Medicare Other | Admitting: Radiation Oncology

## 2019-03-22 DIAGNOSIS — H539 Unspecified visual disturbance: Secondary | ICD-10-CM | POA: Diagnosis not present

## 2019-03-22 DIAGNOSIS — M25512 Pain in left shoulder: Secondary | ICD-10-CM | POA: Diagnosis not present

## 2019-03-22 DIAGNOSIS — G8929 Other chronic pain: Secondary | ICD-10-CM | POA: Diagnosis not present

## 2019-03-22 DIAGNOSIS — M5481 Occipital neuralgia: Secondary | ICD-10-CM | POA: Diagnosis not present

## 2019-03-22 DIAGNOSIS — B0229 Other postherpetic nervous system involvement: Secondary | ICD-10-CM | POA: Diagnosis not present

## 2019-03-22 DIAGNOSIS — M542 Cervicalgia: Secondary | ICD-10-CM | POA: Diagnosis not present

## 2019-03-26 DIAGNOSIS — Z23 Encounter for immunization: Secondary | ICD-10-CM | POA: Diagnosis not present

## 2019-03-30 DIAGNOSIS — M79671 Pain in right foot: Secondary | ICD-10-CM | POA: Diagnosis not present

## 2019-03-30 DIAGNOSIS — S92321D Displaced fracture of second metatarsal bone, right foot, subsequent encounter for fracture with routine healing: Secondary | ICD-10-CM | POA: Diagnosis not present

## 2019-04-19 DIAGNOSIS — M79671 Pain in right foot: Secondary | ICD-10-CM | POA: Diagnosis not present

## 2019-04-19 DIAGNOSIS — S92321D Displaced fracture of second metatarsal bone, right foot, subsequent encounter for fracture with routine healing: Secondary | ICD-10-CM | POA: Diagnosis not present

## 2019-05-24 DIAGNOSIS — C9110 Chronic lymphocytic leukemia of B-cell type not having achieved remission: Secondary | ICD-10-CM | POA: Diagnosis not present

## 2019-05-24 DIAGNOSIS — R7309 Other abnormal glucose: Secondary | ICD-10-CM | POA: Diagnosis not present

## 2019-05-24 DIAGNOSIS — Z0000 Encounter for general adult medical examination without abnormal findings: Secondary | ICD-10-CM | POA: Insufficient documentation

## 2019-05-24 DIAGNOSIS — J069 Acute upper respiratory infection, unspecified: Secondary | ICD-10-CM | POA: Diagnosis not present

## 2019-06-15 DIAGNOSIS — J189 Pneumonia, unspecified organism: Secondary | ICD-10-CM | POA: Diagnosis not present

## 2019-06-15 DIAGNOSIS — C9110 Chronic lymphocytic leukemia of B-cell type not having achieved remission: Secondary | ICD-10-CM | POA: Diagnosis not present

## 2019-06-15 DIAGNOSIS — U071 COVID-19: Secondary | ICD-10-CM | POA: Diagnosis not present

## 2019-07-07 ENCOUNTER — Inpatient Hospital Stay: Payer: Medicare Other

## 2019-07-07 ENCOUNTER — Inpatient Hospital Stay: Payer: Medicare Other | Admitting: Hematology and Oncology

## 2019-07-09 ENCOUNTER — Other Ambulatory Visit: Payer: Self-pay | Admitting: Hematology and Oncology

## 2019-07-09 DIAGNOSIS — D0511 Intraductal carcinoma in situ of right breast: Secondary | ICD-10-CM

## 2019-07-12 ENCOUNTER — Other Ambulatory Visit: Payer: Self-pay | Admitting: Nurse Practitioner

## 2019-07-12 DIAGNOSIS — B0229 Other postherpetic nervous system involvement: Secondary | ICD-10-CM

## 2019-07-13 ENCOUNTER — Other Ambulatory Visit: Payer: Self-pay | Admitting: *Deleted

## 2019-07-13 DIAGNOSIS — B0229 Other postherpetic nervous system involvement: Secondary | ICD-10-CM

## 2019-07-13 DIAGNOSIS — J189 Pneumonia, unspecified organism: Secondary | ICD-10-CM | POA: Diagnosis not present

## 2019-07-14 ENCOUNTER — Ambulatory Visit: Payer: Medicare Other | Admitting: Nurse Practitioner

## 2019-08-01 ENCOUNTER — Encounter (INDEPENDENT_AMBULATORY_CARE_PROVIDER_SITE_OTHER): Payer: Medicare Other | Admitting: Vascular Surgery

## 2019-08-02 NOTE — Progress Notes (Signed)
[facility]  [address], [address] [place], [place] [address] Phone: [phone]  Fax: [phone]   Clinic Day:  08/04/2019  Referring physician: Mikey College, *  Chief Complaint: Joanna Phillips is a 69 y.o. female with  chronic lymphocytic leukemia (CLL) and right breast DCIS who is seen for a 6 month assessment.  HPI: The patient was last seen in the medical oncology clinic on 01/13/2019.  At that time, she was doing well. Exam was unremarkable. Hematocrit was 38.2, hemoglobin 12.1, platelets 170,000, WBC 12,800 (ANC 2,200; ALC 9,400).  Sodium was 133.  She continued Arimidex.   During the interim, she has felt "much better". She had pneumonia with COVID-19 around 04/2019. She had diarrhea, body aches and headaches. She reported that her balance was off and is still a bit off. She started to feel better in 06/2019. She notes her daughter and son both had COVID-53 as well. Her husband never contracted COVID-49. She notes issues with her legs s/p COVID-19. She denies any fevers or sweats. She notes some lumps and bruises after hitting objects.   Patient remains on Arimidex. She is taking her calcium and vitamin D. Her scalp continues to heal s/p zoster infection. She continues to have headaches. She feels like COVID-19 infected the same nerves as shingles. She reports swelling and pain in her bilateral ankles. She will be seen today in vascular surgery. She has a facial rash on checks s/p shingles. Patient examines her breast monthly.  She denies any breast concerns.   Past Medical History:  Diagnosis Date  . Arthritis   . CLL (chronic lymphocytic leukemia) ([place]) 2011  . DCIS (ductal carcinoma in situ) 03/11/2016  . GERD (gastroesophageal reflux disease)     Past Surgical History:  Procedure Laterality Date  . BREAST BIOPSY Right 02/12/2016   path pending  . CHOLECYSTECTOMY  1996  . COLONOSCOPY  2013    Family History  Problem Relation Age of  Onset  . Cancer Father        lung  . Diabetes Mother   . Breast cancer Maternal Aunt     Social History:  reports that she has never smoked. She has never used smokeless tobacco. She reports that she does not drink alcohol or use drugs. She retired on 05/26/2017. She lives in [place]. The patient is alone today.  Allergies:  Allergies  Allergen Reactions  . Amoxicillin Hives  . Hydrocodone Itching  . Sulfamethoxazole-Trimethoprim Nausea Only  . Allopurinol Rash    Current Medications: Current Outpatient Medications  Medication Sig Dispense Refill  . anastrozole (ARIMIDEX) 1 MG tablet Take 1 tablet by mouth once daily 90 tablet 0  . aspirin EC 81 MG tablet Take 81 mg by mouth daily.    . Biotin 1 MG CAPS Take 1 capsule by mouth daily.     . calcium-vitamin D (OSCAL WITH D) 500-200 MG-UNIT TABS tablet Take 1 tablet by mouth daily.     Marland Kitchen gabapentin (NEURONTIN) 800 MG tablet Take 1.5 tablets (1,200 mg total) by mouth 2 (two) times daily. 270 tablet 1  . omeprazole (PRILOSEC) 20 MG capsule Take 1 capsule by mouth once daily 90 capsule 3  . fluticasone (FLONASE) 50 MCG/ACT nasal spray Place 2 sprays into both nostrils daily. (Patient not taking: Reported on 08/04/2019) 16 g 11   No current facility-administered medications for this visit.    Review of Systems  Constitutional: Negative.  Negative for chills, fever, malaise/fatigue and weight  loss (up 10 lbs).       Feels "much better".  HENT: Negative.  Negative for congestion, ear pain, nosebleeds, sinus pain and sore throat.   Eyes: Negative.  Negative for blurred vision, double vision, photophobia, pain and discharge.  Respiratory: Negative.  Negative for cough, hemoptysis, sputum production and shortness of breath.   Cardiovascular: Positive for leg swelling (bilateral ankles). Negative for chest pain, palpitations and orthopnea.  Gastrointestinal: Negative.  Negative for abdominal pain, blood in stool, constipation, diarrhea,  melena, nausea and vomiting.  Genitourinary: Negative.  Negative for dysuria, frequency, hematuria and urgency.  Musculoskeletal: Positive for joint pain (bilateral ankles) and myalgias (legs). Negative for back pain.  Skin: Positive for rash (facial s/p shingles). Negative for itching.       Scalp healing from prior zoster infection.  Hair growing in. Lumps appear after bumping into objects.  Neurological: Positive for headaches. Negative for tingling, sensory change, focal weakness and weakness.       Off balance at times s/p covid.  Endo/Heme/Allergies: Negative for environmental allergies. Bruises/bleeds easily.  Psychiatric/Behavioral: Negative.  Negative for depression and memory loss. The patient is not nervous/anxious and does not have insomnia.   All other systems reviewed and are negative.  Performance status (ECOG): 1  Vitals Blood pressure 108/76, pulse 78, temperature (!) 97.3 F (36.3 C), temperature source Tympanic, resp. rate 18, weight 215 lb 13.3 oz (97.9 kg), SpO2 100 %.   Physical Exam  Constitutional: She is oriented to person, place, and time. She appears well-developed and well-nourished. No distress.  HENT:  Head: Normocephalic and atraumatic.  Mouth/Throat: Oropharynx is clear and moist. No oropharyngeal exudate.  Short gray hair. Mask.  Eyes: Pupils are equal, round, and reactive to light. Conjunctivae and EOM are normal. No scleral icterus.  Glasses.  Blue eyes.  Neck: No JVD present.  Cardiovascular: Normal rate, regular rhythm and normal heart sounds.  No murmur heard. Pulmonary/Chest: Effort normal and breath sounds normal. No respiratory distress. She has no wheezes. She has no rales. She exhibits no tenderness. Right breast exhibits no inverted nipple, no mass, no nipple discharge, no skin change and no tenderness. Left breast exhibits no inverted nipple, no mass, no nipple discharge, no skin change and no tenderness.  Abdominal: Soft. Bowel sounds are  normal. She exhibits no mass. There is no abdominal tenderness. There is no rebound and no guarding.  Musculoskeletal:        General: Edema (BLE) present. No tenderness. Normal range of motion.     Cervical back: Normal range of motion and neck supple.  Lymphadenopathy:    She has no cervical adenopathy.    She has no axillary adenopathy.       Right: No supraclavicular adenopathy present.       Left: No supraclavicular adenopathy present.  Neurological: She is alert and oriented to person, place, and time. She has normal reflexes.  Skin: Skin is warm and dry. Rash (facial; s/p shingles) noted. She is not diaphoretic. No erythema. No pallor.  Scalp healed. Hair loss improving.  Psychiatric: She has a normal mood and affect. Her behavior is normal. Judgment and thought content normal.  Nursing note and vitals reviewed.   Appointment on 08/04/2019  Component Date Value Ref Range Status  . WBC 08/04/2019 10.2  4.0 - 10.5 K/uL Final  . RBC 08/04/2019 4.79  3.87 - 5.11 MIL/uL Final  . Hemoglobin 08/04/2019 12.4  12.0 - 15.0 g/dL Final  . HCT 08/04/2019 40.4  36.0 - 46.0 % Final  . MCV 08/04/2019 84.3  80.0 - 100.0 fL Final  . MCH 08/04/2019 25.9* 26.0 - 34.0 pg Final  . MCHC 08/04/2019 30.7  30.0 - 36.0 g/dL Final  . RDW 08/04/2019 16.2* 11.5 - 15.5 % Final  . Platelets 08/04/2019 201  150 - 400 K/uL Final  . nRBC 08/04/2019 0.0  0.0 - 0.2 % Final   Performed at [facility], [address], [place], [place] [address]  . Neutrophils Relative % 08/04/2019 PENDING  % Incomplete  . Neutro Abs 08/04/2019 PENDING  1.7 - 7.7 K/uL Incomplete  . Band Neutrophils 08/04/2019 PENDING  % Incomplete  . Lymphocytes Relative 08/04/2019 PENDING  % Incomplete  . Lymphs Abs 08/04/2019 PENDING  0.7 - 4.0 K/uL Incomplete  . Monocytes Relative 08/04/2019 PENDING  % Incomplete  . Monocytes Absolute 08/04/2019 PENDING  0.1 - 1.0 K/uL Incomplete  . Eosinophils Relative 08/04/2019 PENDING  %  Incomplete  . Eosinophils Absolute 08/04/2019 PENDING  0.0 - 0.5 K/uL Incomplete  . Basophils Relative 08/04/2019 PENDING  % Incomplete  . Basophils Absolute 08/04/2019 PENDING  0.0 - 0.1 K/uL Incomplete  . WBC Morphology 08/04/2019 PENDING   Incomplete  . RBC Morphology 08/04/2019 PENDING   Incomplete  . Smear Review 08/04/2019 PENDING   Incomplete  . Other 08/04/2019 PENDING  % Incomplete  . nRBC 08/04/2019 PENDING  0 /100 WBC Incomplete  . Metamyelocytes Relative 08/04/2019 PENDING  % Incomplete  . Myelocytes 08/04/2019 PENDING  % Incomplete  . Promyelocytes Relative 08/04/2019 PENDING  % Incomplete  . Blasts 08/04/2019 PENDING  % Incomplete  . Immature Granulocytes 08/04/2019 PENDING  % Incomplete  . Abs Immature Granulocytes 08/04/2019 PENDING  0.00 - 0.07 K/uL Incomplete    [facility] Labs: 06/08/2015:hematocrit of 35.1, hemoglobin 11.7, MCV 81, platelets 225,000, WBC 17,200 with an ANC of 3100. Absolutle lymphocyte count (ALC) 12,200. Uric acid 8.0 (elevated; last check 7.7- elevated). Creatinine 0.90. LDH 217. 12/05/2015:hematocrit of 34.1, hemoglobin 10.9, MCV 81, platelets 213,000, WBC 17,700 with an ANC of 3100. ALC 13,600. Uric acid 6.0. Creatinine 0.81. LDH 212. 05/14/2016:hematocrit of 34.0, hemoglobin 11.1, MCV 83, platelets 382,000, WBC 13,800 with an ANC of 4600. ALC 7,700.  06/11/2016: hematocrit of 36.0, hemoglobin 11.5, MCV 82, platelets 213,000, WBC 8,100 with an ANC of 3300. ALC 3,900. Creatinine 0.84. Ferritin 79. Iron saturation 12%. Reticulocyte count 1.1%. Coombs negative. 07/18/2016:hematocrit of 35.6, hemoglobin 11.8, MCV 83, platelets 200,000, WBC 6,900 with an ANC of 2600. ALC 3,600. Creatinine 0.78. LFTs normal. 10/17/2016:hematocrit of 35.9, hemoglobin 11.8, MCV 84, platelets 248,000, WBC 10,500 with an ANC of 3200. ALC 3,600. Creatinine 0.93. LFTs normal. 02/02/2017:hematocrit of 32.2, hemoglobin 10.4, MCV 83, platelets 361,000, WBC  15,700 with an ANC of 7200. ALC 7,200. Creatinine 0.84. LFTs normal. Albumen 3.5. 05/01/2017:hematocrit of34.3, hemoglobin 11.1, MCV79, platelets281,000, WBC9,200 with an Nelson of2900. ALC 5,200. Creatinine 0.87. LFTs normal. Albumin 3.5.B12 was 383. Folate 12.6, Ferritin 125. Reticulocyte count 1.9%.   Assessment:  Joanna Phillips is a 69 y.o. female with stage I chronic lymphocytic leukemiadiagnosed in 2011. She was diagnosed with right breast DCIS in 02/2016.  CLL:She presented with mild lymphocytosis and small cervical adenopathy on routine physical exam. CBC revealed only lymphocytosis. 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 SPEP was normal. Chest, abdomen, and pelvic CT scan revealed small diffuse nodes.  DCIS:She underwent wire-guided right partial mastectomy on 03/11/2016 at [facility]. Pathology revealed a 3.9 cm grade III DCIS. ER was strongly positive (Allred score 8) in 100% of cells. PR was strongly positive (Allred score 6) in 30% of cells. Pathologic stage was TisNx. She completed breast radiationon 06/25/2016.   She began Arimidexon 06/26/2016. She is tolerating Arimidex well.   [facility] on 12[facility]/2016 revealed no evidence of malignancy. There was interval development of post lumpectomy changes. [facility] on 12/25/2017 revealed no evidence of malignancy.  Bilateral mammogram and limited right breast ultrasound at [facility] on 12/20/2018 revealed no mammographic or sonographic evidence of malignancy.  She has a history ofintermittent rectal and vaginal bleedingin 2015. She was seen by gynecology at the [facility]. Pelvic exam and ultrasound revealed fibroids.   EGDon 11/11/2011 revealed a hiatal hernia and gastritis. Colonoscopy on 11/11/2011 revealed  diverticulosis in the ascending, descending, and sigmoid colon. Stool guaiacs x 3 were negative in 06/2014. She denies any melena or hematochezia.   She has a history of varicella zosterinvolving the right side of her scalp. She was admitted to [facility] from 12/21/2015 - 12/25/2015 with disseminated varicella zoster. She was treated with IV acyclovir then switched to oral valacyclovir (completed 01/10/2016). She is on prophylactic valacyclovir. Scalp has undergone debridement at the wound care center.  Bone densityon 05/21/2016 was normalwith a T score of -0.3 in the AP spine L1-L4 and -0.3 in the left femoral neck. Bone densityon 06/16/2018 was normalwith a T-score of -0.9 in the left forearm radius.   Symptomatically, she is feeling better since COVID-19 infection.  Exam is stable.  Plan: 1.   Labs today: CBC with diff, CMP.  2.Chronic lymphocytic leukemia Hematocrit 40.4. Hemoglobin 12.4. Platelets 201,000. WBC 10,200 ([place] 2000; ALC 7200) Clinically, she is doing well without B symptoms.               Continue surveillance.   3.Right breast DCIS Exam reveals no evidence of recurrent disease.               Bilateral mammogram and right sided ultrasound revealed no evidence of disease on 12/20/2018.              Continue Arimidex (started 06/26/2016).   4.   Bilateral mammogram 12/21/2019. 5.   RTC in 6 months for MD assessment, labs (CBC with diff, CMP), and review of mammogram.  I discussed the assessment and treatment plan with the patient.  The patient was provided an opportunity to ask questions and all were answered.  The patient agreed with the plan and demonstrated an understanding of the instructions.  The patient was advised to call back if the symptoms worsen or if the condition fails to improve as anticipated.   Lequita Asal, MD, PhD    08/04/2019, 10:27 AM  I, Selena Batten, am acting as scribe for [employer].  Mike Gip, MD, PhD.  I, Milo Schreier C. Mike Gip, MD, have reviewed the above documentation for accuracy and completeness, and I agree with the above.

## 2019-08-04 ENCOUNTER — Ambulatory Visit (INDEPENDENT_AMBULATORY_CARE_PROVIDER_SITE_OTHER): Payer: Medicare Other | Admitting: Vascular Surgery

## 2019-08-04 ENCOUNTER — Inpatient Hospital Stay: Payer: Medicare Other | Attending: Hematology and Oncology

## 2019-08-04 ENCOUNTER — Other Ambulatory Visit: Payer: Self-pay

## 2019-08-04 ENCOUNTER — Encounter: Payer: Self-pay | Admitting: Hematology and Oncology

## 2019-08-04 ENCOUNTER — Encounter (INDEPENDENT_AMBULATORY_CARE_PROVIDER_SITE_OTHER): Payer: Self-pay | Admitting: Vascular Surgery

## 2019-08-04 ENCOUNTER — Inpatient Hospital Stay (HOSPITAL_BASED_OUTPATIENT_CLINIC_OR_DEPARTMENT_OTHER): Payer: Medicare Other | Admitting: Hematology and Oncology

## 2019-08-04 VITALS — BP 108/76 | HR 78 | Temp 97.3°F | Resp 18 | Wt 215.8 lb

## 2019-08-04 VITALS — BP 103/70 | HR 87 | Resp 10 | Ht 65.0 in | Wt 218.0 lb

## 2019-08-04 DIAGNOSIS — C9110 Chronic lymphocytic leukemia of B-cell type not having achieved remission: Secondary | ICD-10-CM | POA: Diagnosis not present

## 2019-08-04 DIAGNOSIS — K219 Gastro-esophageal reflux disease without esophagitis: Secondary | ICD-10-CM

## 2019-08-04 DIAGNOSIS — I890 Lymphedema, not elsewhere classified: Secondary | ICD-10-CM | POA: Diagnosis not present

## 2019-08-04 DIAGNOSIS — D0511 Intraductal carcinoma in situ of right breast: Secondary | ICD-10-CM | POA: Diagnosis not present

## 2019-08-04 DIAGNOSIS — M1710 Unilateral primary osteoarthritis, unspecified knee: Secondary | ICD-10-CM | POA: Insufficient documentation

## 2019-08-04 DIAGNOSIS — M79605 Pain in left leg: Secondary | ICD-10-CM

## 2019-08-04 DIAGNOSIS — I872 Venous insufficiency (chronic) (peripheral): Secondary | ICD-10-CM | POA: Diagnosis not present

## 2019-08-04 DIAGNOSIS — M5430 Sciatica, unspecified side: Secondary | ICD-10-CM | POA: Insufficient documentation

## 2019-08-04 DIAGNOSIS — M79604 Pain in right leg: Secondary | ICD-10-CM | POA: Diagnosis not present

## 2019-08-04 LAB — CBC WITH DIFFERENTIAL/PLATELET
Abs Immature Granulocytes: 0.02 10*3/uL (ref 0.00–0.07)
Basophils Absolute: 0.1 10*3/uL (ref 0.0–0.1)
Basophils Relative: 1 %
Eosinophils Absolute: 0.1 10*3/uL (ref 0.0–0.5)
Eosinophils Relative: 1 %
HCT: 40.4 % (ref 36.0–46.0)
Hemoglobin: 12.4 g/dL (ref 12.0–15.0)
Immature Granulocytes: 0 %
Lymphocytes Relative: 70 %
Lymphs Abs: 7.2 10*3/uL — ABNORMAL HIGH (ref 0.7–4.0)
MCH: 25.9 pg — ABNORMAL LOW (ref 26.0–34.0)
MCHC: 30.7 g/dL (ref 30.0–36.0)
MCV: 84.3 fL (ref 80.0–100.0)
Monocytes Absolute: 0.8 10*3/uL (ref 0.1–1.0)
Monocytes Relative: 8 %
Neutro Abs: 2 10*3/uL (ref 1.7–7.7)
Neutrophils Relative %: 20 %
Platelets: 201 10*3/uL (ref 150–400)
RBC: 4.79 MIL/uL (ref 3.87–5.11)
RDW: 16.2 % — ABNORMAL HIGH (ref 11.5–15.5)
WBC: 10.2 10*3/uL (ref 4.0–10.5)
nRBC: 0 % (ref 0.0–0.2)

## 2019-08-04 LAB — COMPREHENSIVE METABOLIC PANEL
ALT: 20 U/L (ref 0–44)
AST: 22 U/L (ref 15–41)
Albumin: 4 g/dL (ref 3.5–5.0)
Alkaline Phosphatase: 76 U/L (ref 38–126)
Anion gap: 4 — ABNORMAL LOW (ref 5–15)
BUN: 19 mg/dL (ref 8–23)
CO2: 26 mmol/L (ref 22–32)
Calcium: 8.6 mg/dL — ABNORMAL LOW (ref 8.9–10.3)
Chloride: 104 mmol/L (ref 98–111)
Creatinine, Ser: 0.84 mg/dL (ref 0.44–1.00)
GFR calc Af Amer: >60 mL/min (ref >60)
GFR calc non Af Amer: >60 mL/min (ref >60)
Glucose, Bld: 111 mg/dL — ABNORMAL HIGH (ref 70–99)
Potassium: 4.3 mmol/L (ref 3.5–5.1)
Sodium: 134 mmol/L — ABNORMAL LOW (ref 135–145)
Total Bilirubin: 0.6 mg/dL (ref 0.3–1.2)
Total Protein: 7.1 g/dL (ref 6.5–8.1)

## 2019-08-04 NOTE — Progress Notes (Signed)
No new changes noted today 

## 2019-08-08 ENCOUNTER — Inpatient Hospital Stay
Admission: RE | Admit: 2019-08-08 | Discharge: 2019-08-08 | Disposition: A | Discharge status: Home / Self Care | Payer: Self-pay | Source: Ambulatory Visit | Attending: *Deleted | Admitting: *Deleted

## 2019-08-08 ENCOUNTER — Other Ambulatory Visit: Payer: Self-pay | Admitting: *Deleted

## 2019-08-08 DIAGNOSIS — Z1231 Encounter for screening mammogram for malignant neoplasm of breast: Secondary | ICD-10-CM

## 2019-08-09 ENCOUNTER — Other Ambulatory Visit: Payer: Self-pay | Admitting: Hematology and Oncology

## 2019-08-09 DIAGNOSIS — D0511 Intraductal carcinoma in situ of right breast: Secondary | ICD-10-CM

## 2019-08-10 ENCOUNTER — Encounter (INDEPENDENT_AMBULATORY_CARE_PROVIDER_SITE_OTHER): Payer: Self-pay | Admitting: Vascular Surgery

## 2019-08-10 DIAGNOSIS — M79606 Pain in leg, unspecified: Secondary | ICD-10-CM | POA: Insufficient documentation

## 2019-08-10 DIAGNOSIS — I890 Lymphedema, not elsewhere classified: Secondary | ICD-10-CM | POA: Insufficient documentation

## 2019-08-10 NOTE — Progress Notes (Signed)
MRN : [id number]  Joanna Phillips is a 69 y.o. (August 19, 1950) female who presents with chief complaint of  Chief Complaint  Patient presents with   New Patient (Initial Visit)    Venous insuff  .  History of Present Illness:   Patient is seen for evaluation of leg pain and leg swelling. The patient first noticed the swelling remotely. The swelling is associated with pain and discoloration. The pain and swelling worsens with prolonged dependency and improves with elevation. The pain is unrelated to activity.  The patient notes that in the morning the legs are significantly improved but they steadily worsened throughout the course of the day. The patient also notes a steady worsening of the discoloration in the ankle and shin area.   The patient denies claudication symptoms.  The patient denies symptoms consistent with rest pain.  The patient denies and extensive history of DJD and LS spine disease.  The patient has no had any past angiography, interventions or vascular surgery.  Elevation makes the leg symptoms better, dependency makes them much worse. There is no history of ulcerations. The patient denies any recent changes in medications.  The patient has not been wearing graduated compression.  The patient denies a history of DVT or PE. There is no prior history of phlebitis. There is no history of primary lymphedema.  Patient has a history of CLL. No history of trauma or groin or pelvic surgery. There is no history of radiation treatment to the groin or pelvis  The patient denies amaurosis fugax or recent TIA symptoms. There are no recent neurological changes noted. The patient denies recent episodes of angina or shortness of breath  Current Meds  Medication Sig   anastrozole (ARIMIDEX) 1 MG tablet Take 1 tablet by mouth once daily   aspirin EC 81 MG tablet Take 81 mg by mouth daily.   calcium-vitamin D (OSCAL WITH D) 500-200 MG-UNIT TABS tablet Take 1 tablet by mouth daily.      fluticasone (FLONASE) 50 MCG/ACT nasal spray Place 2 sprays into both nostrils daily.   gabapentin (NEURONTIN) 800 MG tablet Take 1.5 tablets (1,200 mg total) by mouth 2 (two) times daily.   omeprazole (PRILOSEC) 20 MG capsule Take 1 capsule by mouth once daily    Past Medical History:  Diagnosis Date   Arthritis    CLL (chronic lymphocytic leukemia) ([place]) 2011   DCIS (ductal carcinoma in situ) 03/11/2016   GERD (gastroesophageal reflux disease)     Past Surgical History:  Procedure Laterality Date   BREAST BIOPSY Right 02/12/2016   path pending   CHOLECYSTECTOMY  1996   COLONOSCOPY  2013    Social History Social History   Tobacco Use   Smoking status: Never Smoker   Smokeless tobacco: Never Used  Substance Use Topics   Alcohol use: No   Drug use: No    Family History Family History  Problem Relation Age of Onset   Cancer Father        lung   Diabetes Mother    Breast cancer Maternal Aunt   No family history of bleeding/clotting disorders, porphyria or autoimmune disease   Allergies  Allergen Reactions   Amoxicillin Hives   Hydrocodone Itching   Sulfamethoxazole-Trimethoprim Nausea Only   Allopurinol Rash     REVIEW OF SYSTEMS (Negative unless checked)  Constitutional: [] Weight loss  [] Fever  [] Chills Cardiac: [] Chest pain   [] Chest pressure   [] Palpitations   [] Shortness of breath when laying flat   []   Shortness of breath with exertion. Vascular:  [] Pain in legs with walking   [x] Pain in legs at rest  [] History of DVT   [] Phlebitis   [x] Swelling in legs   [] Varicose veins   [] Non-healing ulcers Pulmonary:   [] Uses home oxygen   [] Productive cough   [] Hemoptysis   [] Wheeze  [] COPD   [] Asthma Neurologic:  [] Dizziness   [] Seizures   [] History of stroke   [] History of TIA  [] Aphasia   [] Vissual changes   [] Weakness or numbness in arm   [x] Weakness or numbness in leg Musculoskeletal:   [] Joint swelling   [] Joint pain   [x] Low back  pain Hematologic:  [] Easy bruising  [] Easy bleeding   [] Hypercoagulable state   [] Anemic Gastrointestinal:  [] Diarrhea   [] Vomiting  [x] Gastroesophageal reflux/heartburn   [] Difficulty swallowing. Genitourinary:  [] Chronic kidney disease   [] Difficult urination  [] Frequent urination   [] Blood in urine Skin:  [] Rashes   [] Ulcers  Psychological:  [] History of anxiety   []  History of major depression.  Physical Examination  Vitals:   08/04/19 1458  BP: 103/70  Pulse: 87  Resp: 10  Weight: 218 lb (98.9 kg)  Height: 5\' 5"  (1.651 m)   Body mass index is 36.28 kg/m. Gen: WD/WN, NAD Head: [place]/AT, No temporalis wasting.  Ear/Nose/Throat: Hearing grossly intact, nares w/o erythema or drainage, poor dentition Eyes: PER, EOMI, sclera nonicteric.  Neck: Supple, no masses.  No bruit or JVD.  Pulmonary:  Good air movement, clear to auscultation bilaterally, no use of accessory muscles.  Cardiac: RRR, normal S1, S2, no Murmurs. Vascular: scattered varicosities present bilaterally.  Moderate venous stasis changes to the legs bilaterally.  3+ soft pitting edema bilaterally. Vessel Right Left  Radial Palpable Palpable  PT Palpable Palpable  DP Palpable Palpable  Gastrointestinal: soft, non-distended. No guarding/no peritoneal signs.  Musculoskeletal: M/S 5/5 throughout.  No deformity or atrophy.  Neurologic: CN 2-12 intact. Pain and light touch intact in extremities.  Symmetrical.  Speech is fluent. Motor exam as listed above. Psychiatric: Judgment intact, Mood & affect appropriate for pt's clinical situation. Dermatologic: No rashes or ulcers noted.  No changes consistent with cellulitis.   CBC Lab Results  Component Value Date   WBC 10.2 08/04/2019   HGB 12.4 08/04/2019   HCT 40.4 08/04/2019   MCV 84.3 08/04/2019   PLT 201 08/04/2019    BMET    Component Value Date/Time   NA 134 (L) 08/04/2019 0955   NA 141 02/13/2016 1049   K 4.3 08/04/2019 0955   CL 104 08/04/2019 0955   CO2 26  08/04/2019 0955   GLUCOSE 111 (H) 08/04/2019 0955   BUN 19 08/04/2019 0955   BUN 18 02/13/2016 1049   CREATININE 0.84 08/04/2019 0955   CALCIUM 8.6 (L) 08/04/2019 0955   GFRNONAA >60 08/04/2019 0955   GFRAA >60 08/04/2019 0955   Estimated Creatinine Clearance: 73.6 mL/min (by C-G formula based on SCr of 0.84 mg/dL).  COAG No results found for: INR, PROTIME  Radiology MM Outside Films Mammo  Result Date: 08/09/2019 This examination belongs to an outside facility and is stored here for comparison purposes only.  Contact the originating outside institution for any associated report or interpretation.  MM Outside Films Mammo  Result Date: 08/09/2019 This examination belongs to an outside facility and is stored here for comparison purposes only.  Contact the originating outside institution for any associated report or interpretation.  MM Outside Films Mammo  Result Date: 08/09/2019 This examination belongs to an  outside facility and is stored here for comparison purposes only.  Contact the originating outside institution for any associated report or interpretation.    Assessment/Plan 1. Venous insufficiency No surgery or intervention at this point in time.    I have had a long discussion with the patient regarding venous insufficiency and why it  causes symptoms. I have discussed with the patient the chronic skin changes that accompany venous insufficiency and the long term sequela such as infection and ulceration.  Patient will begin wearing graduated compression stockings class 1 (20-30 mmHg) or compression wraps on a daily basis a prescription was given. The patient will put the stockings on first thing in the morning and removing them in the evening. The patient is instructed specifically not to sleep in the stockings.    In addition, behavioral modification including several periods of elevation of the lower extremities during the day will be continued. I have demonstrated that  proper elevation is a position with the ankles at heart level.  The patient is instructed to begin routine exercise, especially walking on a daily basis  Patient should undergo duplex ultrasound of the venous system to ensure that DVT or reflux is not present.  Following the review of the ultrasound the patient will follow up in 2-3 months to reassess the degree of swelling and the control that graduated compression stockings or compression wraps  is offering.   The patient can be assessed for a Lymph Pump at that time  2. Pain in both lower extremities No surgery or intervention at this point in time.    I have had a long discussion with the patient regarding venous insufficiency and why it  causes symptoms. I have discussed with the patient the chronic skin changes that accompany venous insufficiency and the long term sequela such as infection and ulceration.  Patient will begin wearing graduated compression stockings class 1 (20-30 mmHg) or compression wraps on a daily basis a prescription was given. The patient will put the stockings on first thing in the morning and removing them in the evening. The patient is instructed specifically not to sleep in the stockings.    In addition, behavioral modification including several periods of elevation of the lower extremities during the day will be continued. I have demonstrated that proper elevation is a position with the ankles at heart level.  The patient is instructed to begin routine exercise, especially walking on a daily basis  Patient should undergo duplex ultrasound of the venous system to ensure that DVT or reflux is not present.  Following the review of the ultrasound the patient will follow up in 2-3 months to reassess the degree of swelling and the control that graduated compression stockings or compression wraps  is offering.   The patient can be assessed for a Lymph Pump at that time  3. Lymphedema No surgery or intervention at this  point in time.    I have had a long discussion with the patient regarding venous insufficiency and why it  causes symptoms. I have discussed with the patient the chronic skin changes that accompany venous insufficiency and the long term sequela such as infection and ulceration.  Patient will begin wearing graduated compression stockings class 1 (20-30 mmHg) or compression wraps on a daily basis a prescription was given. The patient will put the stockings on first thing in the morning and removing them in the evening. The patient is instructed specifically not to sleep in the stockings.  In addition, behavioral modification including several periods of elevation of the lower extremities during the day will be continued. I have demonstrated that proper elevation is a position with the ankles at heart level.  The patient is instructed to begin routine exercise, especially walking on a daily basis  Patient should undergo duplex ultrasound of the venous system to ensure that DVT or reflux is not present.  Following the review of the ultrasound the patient will follow up in 2-3 months to reassess the degree of swelling and the control that graduated compression stockings or compression wraps  is offering.   The patient can be assessed for a Lymph Pump at that time  4. Gastroesophageal reflux disease without esophagitis Continue PPI as already ordered, this medication has been reviewed and there are no changes at this time.  Avoidence of caffeine and alcohol  Moderate elevation of the head of the bed   5. Sciatica, unspecified laterality Continue NSAID medications as already ordered, these medications have been reviewed and there are no changes at this time.  Continued activity and therapy was stressed.     Hortencia Pilar, MD  08/10/2019 10:39 AM

## 2019-08-18 ENCOUNTER — Ambulatory Visit (INDEPENDENT_AMBULATORY_CARE_PROVIDER_SITE_OTHER): Payer: Medicare Other | Admitting: Vascular Surgery

## 2019-08-18 ENCOUNTER — Other Ambulatory Visit: Payer: Self-pay

## 2019-08-18 ENCOUNTER — Encounter (INDEPENDENT_AMBULATORY_CARE_PROVIDER_SITE_OTHER): Payer: Self-pay | Admitting: Vascular Surgery

## 2019-08-18 ENCOUNTER — Ambulatory Visit (INDEPENDENT_AMBULATORY_CARE_PROVIDER_SITE_OTHER): Payer: Medicare Other

## 2019-08-18 VITALS — BP 107/71 | HR 80 | Resp 16 | Ht 65.0 in | Wt 216.0 lb

## 2019-08-18 DIAGNOSIS — I890 Lymphedema, not elsewhere classified: Secondary | ICD-10-CM

## 2019-08-18 DIAGNOSIS — M1710 Unilateral primary osteoarthritis, unspecified knee: Secondary | ICD-10-CM

## 2019-08-18 DIAGNOSIS — K219 Gastro-esophageal reflux disease without esophagitis: Secondary | ICD-10-CM

## 2019-08-18 DIAGNOSIS — M79605 Pain in left leg: Secondary | ICD-10-CM | POA: Diagnosis not present

## 2019-08-18 DIAGNOSIS — I872 Venous insufficiency (chronic) (peripheral): Secondary | ICD-10-CM

## 2019-08-18 DIAGNOSIS — M79604 Pain in right leg: Secondary | ICD-10-CM | POA: Diagnosis not present

## 2019-08-20 ENCOUNTER — Encounter (INDEPENDENT_AMBULATORY_CARE_PROVIDER_SITE_OTHER): Payer: Self-pay | Admitting: Vascular Surgery

## 2019-08-20 NOTE — Progress Notes (Signed)
MRN : [id number]  Joanna Phillips is a 69 y.o. ([birth year]) female who presents with chief complaint of  Chief Complaint  Patient presents with  . Follow-up    ultrasound follow up   .  History of Present Illness:   The patient returns to the office for followup evaluation regarding leg swelling.  The swelling has improved quite a bit and the pain associated with swelling has decreased substantially. There have not been any interval development of a ulcerations or wounds.  Since the previous visit the patient has been wearing graduated compression stockings and has noted little significant improvement in the lymphedema. The patient has been using compression routinely morning until night.  The patient also states elevation during the day and exercise is being done too.  Venous duplex shows deep and superficial reflux bilaterally  Current Meds  Medication Sig  . anastrozole (ARIMIDEX) 1 MG tablet Take 1 tablet by mouth once daily  . aspirin EC 81 MG tablet Take 81 mg by mouth daily.  . Biotin 1 MG CAPS Take 1 capsule by mouth daily.   . calcium-vitamin D (OSCAL WITH D) 500-200 MG-UNIT TABS tablet Take 1 tablet by mouth daily.   . fluticasone (FLONASE) 50 MCG/ACT nasal spray Place 2 sprays into both nostrils daily.  Marland Kitchen gabapentin (NEURONTIN) 800 MG tablet Take 1.5 tablets (1,200 mg total) by mouth 2 (two) times daily.  Marland Kitchen omeprazole (PRILOSEC) 20 MG capsule Take 1 capsule by mouth once daily    Past Medical History:  Diagnosis Date  . Arthritis   . CLL (chronic lymphocytic leukemia) ([place]) 2011  . DCIS (ductal carcinoma in situ) 03/11/2016  . GERD (gastroesophageal reflux disease)     Past Surgical History:  Procedure Laterality Date  . BREAST BIOPSY Right 02/12/2016   path pending  . CHOLECYSTECTOMY  1996  . COLONOSCOPY  2013    Social History Social History   Tobacco Use  . Smoking status: Never Smoker  . Smokeless tobacco: Never Used  Substance Use Topics  .  Alcohol use: No  . Drug use: No    Family History Family History  Problem Relation Age of Onset  . Cancer Father        lung  . Diabetes Mother   . Breast cancer Maternal Aunt     Allergies  Allergen Reactions  . Amoxicillin Hives  . Hydrocodone Itching  . Sulfamethoxazole-Trimethoprim Nausea Only  . Allopurinol Rash     REVIEW OF SYSTEMS (Negative unless checked)  Constitutional: [] Weight loss  [] Fever  [] Chills Cardiac: [] Chest pain   [] Chest pressure   [] Palpitations   [] Shortness of breath when laying flat   [] Shortness of breath with exertion. Vascular:  [] Pain in legs with walking   [x] Pain in legs at rest  [] History of DVT   [] Phlebitis   [x] Swelling in legs   [] Varicose veins   [] Non-healing ulcers Pulmonary:   [] Uses home oxygen   [] Productive cough   [] Hemoptysis   [] Wheeze  [] COPD   [] Asthma Neurologic:  [] Dizziness   [] Seizures   [] History of stroke   [] History of TIA  [] Aphasia   [] Vissual changes   [] Weakness or numbness in arm   [] Weakness or numbness in leg Musculoskeletal:   [] Joint swelling   [] Joint pain   [] Low back pain Hematologic:  [] Easy bruising  [] Easy bleeding   [] Hypercoagulable state   [] Anemic Gastrointestinal:  [] Diarrhea   [] Vomiting  [x] Gastroesophageal reflux/heartburn   [] Difficulty swallowing. Genitourinary:  [] Chronic kidney  disease   [] Difficult urination  [] Frequent urination   [] Blood in urine Skin:  [] Rashes   [] Ulcers  Psychological:  [] History of anxiety   []  History of major depression.  Physical Examination  Vitals:   08/18/19 1039  BP: 107/71  Pulse: 80  Resp: 16  Weight: 216 lb (98 kg)  Height: 5\' 5"  (1.651 m)   Body mass index is 35.94 kg/m. Gen: WD/WN, NAD Head: /AT, No temporalis wasting.  Ear/Nose/Throat: Hearing grossly intact, nares w/o erythema or drainage Eyes: PER, EOMI, sclera nonicteric.  Neck: Supple, no large masses.   Pulmonary:  Good air movement, no audible wheezing bilaterally, no use of accessory  muscles.  Cardiac: RRR, no JVD Vascular: scattered varicosities present bilaterally.  Mild venous stasis changes to the legs bilaterally.  2-3+ soft pitting edema Vessel Right Left  Radial Palpable Palpable  Gastrointestinal: Non-distended. No guarding/no peritoneal signs.  Musculoskeletal: M/S 5/5 throughout.  No deformity or atrophy.  Neurologic: CN 2-12 intact. Symmetrical.  Speech is fluent. Motor exam as listed above. Psychiatric: Judgment intact, Mood & affect appropriate for pt's clinical situation. Dermatologic: No rashes or ulcers noted.  No changes consistent with cellulitis.   CBC Lab Results  Component Value Date   WBC 10.2 08/04/2019   HGB 12.4 08/04/2019   HCT 40.4 08/04/2019   MCV 84.3 08/04/2019   PLT 201 08/04/2019    BMET    Component Value Date/Time   NA 134 (L) 08/04/2019 0955   NA 141 02/13/2016 1049   K 4.3 08/04/2019 0955   CL 104 08/04/2019 0955   CO2 26 08/04/2019 0955   GLUCOSE 111 (H) 08/04/2019 0955   BUN 19 08/04/2019 0955   BUN 18 02/13/2016 1049   CREATININE 0.84 08/04/2019 0955   CALCIUM 8.6 (L) 08/04/2019 0955   GFRNONAA >60 08/04/2019 0955   GFRAA >60 08/04/2019 0955   Estimated Creatinine Clearance: 73.2 mL/min (by C-G formula based on SCr of 0.84 mg/dL).  COAG No results found for: INR, PROTIME  Radiology VAS [place] LOWER EXTREMITY VENOUS REFLUX  Result Date: 08/18/2019  Lower Venous Reflux Study Performing Technologist: Charlane Ferretti RT (R)(VS)  Examination Guidelines: A complete evaluation includes B-mode imaging, spectral Doppler, color Doppler, and power Doppler as needed of all accessible portions of each vessel. Bilateral testing is considered an integral part of a complete examination. Limited examinations for reoccurring indications may be performed as noted. The reflux portion of the exam is performed with the patient in reverse Trendelenburg. Significant venous reflux is defined as >500 ms in the superficial venous system, and  >1 second in the deep venous system.  Venous Reflux Times +--------------+---------+----------+-----------+------------+---------+ RIGHT         Reflux NoReflux YesReflux TimeDiameter cmsComments  +--------------+---------+----------+-----------+------------+---------+ CFV                       yes      1108 ms                        +--------------+---------+----------+-----------+------------+---------+ FV mid        no                                                  +--------------+---------+----------+-----------+------------+---------+ Popliteal     no                                                  +--------------+---------+----------+-----------+------------+---------+  GSV at [facility]                yes      653 ms       .54               +--------------+---------+----------+-----------+------------+---------+ GSV prox thigh            yes      854 ms       .47               +--------------+---------+----------+-----------+------------+---------+ GSV mid thigh             yes      579 ms       .60     Branching +--------------+---------+----------+-----------+------------+---------+ GSV dist thighno                   418 ms       .34               +--------------+---------+----------+-----------+------------+---------+ GSV at knee               yes      1012 ms      .31               +--------------+---------+----------+-----------+------------+---------+  +--------------+---------+----------+-----------+------------+---------+ LEFT          Reflux NoReflux YesReflux TimeDiameter cmsComments  +--------------+---------+----------+-----------+------------+---------+ CFV                       yes      1643 ms                        +--------------+---------+----------+-----------+------------+---------+ FV mid        no                                                   +--------------+---------+----------+-----------+------------+---------+ GSV at SFJ                yes      836 ms       .61               +--------------+---------+----------+-----------+------------+---------+ GSV prox thighno                                .54     Branching +--------------+---------+----------+-----------+------------+---------+ GSV mid thigh no                                .46               +--------------+---------+----------+-----------+------------+---------+ GSV dist thighno                                .39     Branching +--------------+---------+----------+-----------+------------+---------+ GSV at knee   no                                .40               +--------------+---------+----------+-----------+------------+---------+ GSV prox calf no                                .  37     Branching +--------------+---------+----------+-----------+------------+---------+ SSV Pop Fossa no                                .34               +--------------+---------+----------+-----------+------------+---------+ Summary: Bilateral: - No evidence of deep vein thrombosis seen in the lower extremities, bilaterally, from the common femoral through the popliteal veins. - No evidence of superficial venous thrombosis in the lower extremities, bilaterally.  Right: - Venous reflux is noted in the right common femoral vein. - Venous reflux is noted in the right sapheno-femoral junction. - Venous reflux is noted in the right greater saphenous vein in the thigh.  Left: - Venous reflux is noted in the left common femoral vein. - Venous reflux is noted in the left sapheno-femoral junction.  *See table(s) above for measurements and observations. Electronically signed by Hortencia Pilar MD on 08/18/2019 at 5:05:08 PM.    Final    MM Outside Films Mammo  Result Date: 08/09/2019 This examination belongs to an outside facility and is stored here for comparison  purposes only.  Contact the originating outside institution for any associated report or interpretation.  MM Outside Films Mammo  Result Date: 08/09/2019 This examination belongs to an outside facility and is stored here for comparison purposes only.  Contact the originating outside institution for any associated report or interpretation.  MM Outside Films Mammo  Result Date: 08/09/2019 This examination belongs to an outside facility and is stored here for comparison purposes only.  Contact the originating outside institution for any associated report or interpretation.    Assessment/Plan 1. Venous insufficiency No surgery or intervention at this point in time.  I have reviewed my discussion with the patient regarding venous insufficiency and why it causes symptoms. I have discussed with the patient the chronic skin changes that accompany venous insufficiency and the long term sequela such as ulceration. Patient will contnue wearing graduated compression stockings on a daily basis, as this has provided excellent control of his edema. The patient will put the stockings on first thing in the morning and removing them in the evening. The patient is reminded not to sleep in the stockings.  In addition, behavioral modification including elevation during the day will be initiated. Exercise is strongly encouraged.  Given the patient's good control and lack of any problems regarding the venous insufficiency and lymphedema a lymph pump in not need at this time.  The patient will follow up with me PRN should anything change.  The patient voices agreement with this plan.   2. Lymphedema No surgery or intervention at this point in time.  I have reviewed my discussion with the patient regarding venous insufficiency and why it causes symptoms. I have discussed with the patient the chronic skin changes that accompany venous insufficiency and the long term sequela such as ulceration. Patient will contnue  wearing graduated compression stockings on a daily basis, as this has provided excellent control of his edema. The patient will put the stockings on first thing in the morning and removing them in the evening. The patient is reminded not to sleep in the stockings.  In addition, behavioral modification including elevation during the day will be initiated. Exercise is strongly encouraged.  Given the patient's good control and lack of any problems regarding the venous insufficiency and lymphedema a lymph pump in not need at this time.  The patient  will follow up with me PRN should anything change.  The patient voices agreement with this plan.   3. Gastroesophageal reflux disease without esophagitis Continue PPI as already ordered, this medication has been reviewed and there are no changes at this time.  Avoidence of caffeine and alcohol  Moderate elevation of the head of the bed   4. Arthritis of knee Continue NSAID medications as already ordered, these medications have been reviewed and there are no changes at this time.  Continued activity and therapy was stressed.    Hortencia Pilar, MD  08/20/2019 11:50 AM

## 2019-09-22 DIAGNOSIS — G500 Trigeminal neuralgia: Secondary | ICD-10-CM | POA: Diagnosis not present

## 2019-09-22 DIAGNOSIS — M5481 Occipital neuralgia: Secondary | ICD-10-CM | POA: Diagnosis not present

## 2019-09-22 DIAGNOSIS — B0229 Other postherpetic nervous system involvement: Secondary | ICD-10-CM | POA: Diagnosis not present

## 2019-09-22 DIAGNOSIS — G523 Disorders of hypoglossal nerve: Secondary | ICD-10-CM | POA: Diagnosis not present

## 2019-10-10 ENCOUNTER — Other Ambulatory Visit: Payer: Self-pay | Admitting: Nurse Practitioner

## 2019-10-10 DIAGNOSIS — B0229 Other postherpetic nervous system involvement: Secondary | ICD-10-CM

## 2019-10-10 NOTE — Telephone Encounter (Signed)
Refill request for Gabapentin; last refill 10/21/2018, #180; no valid encounter within last 12 months; no upcoming visits noted; pt last seen 01/11/2019 by Cassell Smiles; attempted to contact pt; left message on voicemail; will route to office for final disposition.  Requested medication (s) are due for refill today: yes  Requested medication (s) are on the active medication list: no; different dosage on chart  Last refill: 10/21/2018  Future visit scheduled: no  Notes to clinic:  No valid encounter within last 12 months

## 2019-10-22 ENCOUNTER — Other Ambulatory Visit: Payer: Self-pay | Admitting: Hematology and Oncology

## 2019-10-22 DIAGNOSIS — D0511 Intraductal carcinoma in situ of right breast: Secondary | ICD-10-CM

## 2019-12-22 ENCOUNTER — Other Ambulatory Visit: Payer: Self-pay

## 2019-12-22 ENCOUNTER — Ambulatory Visit
Admission: RE | Admit: 2019-12-22 | Discharge: 2019-12-22 | Disposition: A | Discharge status: Home / Self Care | Payer: Medicare Other | Source: Ambulatory Visit | Attending: Hematology and Oncology | Admitting: Hematology and Oncology

## 2019-12-22 ENCOUNTER — Other Ambulatory Visit: Payer: Self-pay | Admitting: Hematology and Oncology

## 2019-12-22 DIAGNOSIS — D0511 Intraductal carcinoma in situ of right breast: Secondary | ICD-10-CM | POA: Diagnosis present

## 2019-12-22 DIAGNOSIS — R928 Other abnormal and inconclusive findings on diagnostic imaging of breast: Secondary | ICD-10-CM

## 2019-12-22 DIAGNOSIS — R921 Mammographic calcification found on diagnostic imaging of breast: Secondary | ICD-10-CM | POA: Diagnosis not present

## 2019-12-26 DIAGNOSIS — M1712 Unilateral primary osteoarthritis, left knee: Secondary | ICD-10-CM | POA: Diagnosis not present

## 2019-12-27 ENCOUNTER — Ambulatory Visit
Admission: RE | Admit: 2019-12-27 | Discharge: 2019-12-27 | Disposition: A | Discharge status: Home / Self Care | Payer: Medicare Other | Source: Ambulatory Visit | Attending: Hematology and Oncology | Admitting: Hematology and Oncology

## 2019-12-27 ENCOUNTER — Other Ambulatory Visit: Payer: Self-pay

## 2019-12-27 DIAGNOSIS — R921 Mammographic calcification found on diagnostic imaging of breast: Secondary | ICD-10-CM | POA: Diagnosis not present

## 2019-12-27 DIAGNOSIS — R928 Other abnormal and inconclusive findings on diagnostic imaging of breast: Secondary | ICD-10-CM

## 2019-12-27 DIAGNOSIS — N6081 Other benign mammary dysplasias of right breast: Secondary | ICD-10-CM | POA: Diagnosis not present

## 2019-12-27 HISTORY — PX: BREAST BIOPSY: SHX20

## 2019-12-28 LAB — SURGICAL PATHOLOGY

## 2020-01-05 DIAGNOSIS — M5481 Occipital neuralgia: Secondary | ICD-10-CM | POA: Diagnosis not present

## 2020-01-05 DIAGNOSIS — H539 Unspecified visual disturbance: Secondary | ICD-10-CM | POA: Diagnosis not present

## 2020-01-05 DIAGNOSIS — G523 Disorders of hypoglossal nerve: Secondary | ICD-10-CM | POA: Diagnosis not present

## 2020-01-05 DIAGNOSIS — G500 Trigeminal neuralgia: Secondary | ICD-10-CM | POA: Diagnosis not present

## 2020-02-05 NOTE — Progress Notes (Signed)
[facility]  [address] [place], [place] [address] Phone: [phone]  Fax: [phone]   Clinic Day:  02/06/2020  Referring physician: Kirk Ruths, MD  Chief Complaint: Joanna Phillips is a 69 y.o. female with  chronic lymphocytic leukemia (CLL) and right breast DCIS who is seen for 6 month assessment.  HPI: The patient was last seen in the medical oncology clinic on 08/04/2019. At that time, she was feeling better since her COVID-19 infection.  Exam was stable.  Diagnostic bilateral mammogramon 12/22/2019 revealed a 1.3 cm group of amorphous calcifications at the right breast lumpectomy site. While these may be dystrophic, related to surgery, DCIS cannot be excluded. No evidence of left breast malignancy.  Right breast biopsy on 12/27/2019 revealed benign mammary parenchyma with dense stromal fibrosis, remote fat necrosis, and coarse calcifications. Biopsy was negative for residual atypical proliferative breast disease. Findings were potentially compatible with prior procedure site changes. Clinical and radiographic correlation was recommended. Pathology results were CONCORDANT with imaging findings, per Dr. Valentino Saxon.  During the interim, she has been feeling well overall. She continues to have some ankle pain, but it has improved overall. Her shingles have improved, but she still has some residual pain from the infection.    Past Medical History:  Diagnosis Date  . Arthritis   . Breast cancer ([place]) 2017   DCIS  . CLL (chronic lymphocytic leukemia) ([place]) 2011  . DCIS (ductal carcinoma in situ) 03/11/2016  . GERD (gastroesophageal reflux disease)   . Personal history of radiation therapy 2017    Past Surgical History:  Procedure Laterality Date  . BREAST BIOPSY Right 02/12/2016   DCIS  . BREAST BIOPSY Right 12/27/2019   path pending, calcs, x marker  . BREAST LUMPECTOMY Right 03/11/2016   DCIS neg margins  .  CHOLECYSTECTOMY  1996  . COLONOSCOPY  2013    Family History  Problem Relation Age of Onset  . Cancer Father        lung  . Diabetes Mother   . Breast cancer Maternal Aunt 60  . Breast cancer Maternal Aunt 70    Social History:  reports that she has never smoked. She has never used smokeless tobacco. She reports that she does not drink alcohol and does not use drugs. She retired on 05/26/2017. She lives in [place]. The patient is alone today.   Allergies:  Allergies  Allergen Reactions  . Amoxicillin Hives  . Hydrocodone Itching  . Sulfamethoxazole-Trimethoprim Nausea Only  . Allopurinol Rash    Current Medications: Current Outpatient Medications  Medication Sig Dispense Refill  . anastrozole (ARIMIDEX) 1 MG tablet Take 1 tablet by mouth once daily 90 tablet 0  . aspirin EC 81 MG tablet Take 81 mg by mouth daily.    . Biotin 1 MG CAPS Take 1 capsule by mouth daily.     . calcium-vitamin D (OSCAL WITH D) 500-200 MG-UNIT TABS tablet Take 1 tablet by mouth daily.     . carbamazepine (TEGRETOL) 200 MG tablet carbamazepine 200 mg tablet    . fluticasone (FLONASE) 50 MCG/ACT nasal spray Place 2 sprays into both nostrils daily. 16 g 11  . gabapentin (NEURONTIN) 800 MG tablet Take 1.5 tablets (1,200 mg total) by mouth 2 (two) times daily. 270 tablet 1  . omeprazole (PRILOSEC) 20 MG capsule Take 1 capsule by mouth once daily 90 capsule 3   No current facility-administered medications for this visit.    Review of Systems  Constitutional: Negative.  Negative for chills, fever, malaise/fatigue and weight loss (up 1 lb).       Feels "good".  HENT: Negative.  Negative for congestion, ear pain, nosebleeds, sinus pain and sore throat.   Eyes: Negative.  Negative for blurred vision, double vision, photophobia, pain and discharge.  Respiratory: Negative.  Negative for cough, hemoptysis, sputum production and shortness of breath.   Cardiovascular: Positive for leg swelling (bilateral ankles).  Negative for chest pain, palpitations and orthopnea.  Gastrointestinal: Negative.  Negative for abdominal pain, blood in stool, constipation, diarrhea, heartburn, melena, nausea and vomiting.  Genitourinary: Negative.  Negative for dysuria, frequency, hematuria and urgency.  Musculoskeletal: Positive for joint pain (bilateral ankles) and myalgias (legs). Negative for back pain.  Skin: Negative for itching and rash (shingles resolved).       Scalp healed s/p zoster infection.  Hair growing in.  Neurological: Positive for headaches. Negative for tingling, sensory change, focal weakness and weakness.       Off balance at times s/p COVID-19.  Endo/Heme/Allergies: Negative for environmental allergies. Bruises/bleeds easily.  Psychiatric/Behavioral: Negative.  Negative for depression and memory loss. The patient is not nervous/anxious and does not have insomnia.   All other systems reviewed and are negative.  Performance status (ECOG): 1  Vitals Blood pressure 121/81, pulse 75, temperature 97.9 F (36.6 C), temperature source Tympanic, weight 219 lb 11 oz (99.6 kg), SpO2 100 %.   Physical Exam Vitals and nursing note reviewed.  Constitutional:      General: She is not in acute distress.    Appearance: She is well-developed. She is not diaphoretic.  HENT:     Head: Normocephalic and atraumatic.     Comments: Short gray hair.    Mouth/Throat:     Pharynx: No oropharyngeal exudate.  Eyes:     General: No scleral icterus.    Conjunctiva/sclera: Conjunctivae normal.     Pupils: Pupils are equal, round, and reactive to light.     Comments: Blue eyes.  Neck:     Vascular: No JVD.  Cardiovascular:     Rate and Rhythm: Normal rate and regular rhythm.     Heart sounds: Normal heart sounds. No murmur heard.   Pulmonary:     Effort: Pulmonary effort is normal. No respiratory distress.     Breath sounds: Normal breath sounds. No wheezing or rales.  Chest:     Chest wall: No tenderness.      Breasts:        Right: No inverted nipple, mass, nipple discharge, skin change or tenderness.        Left: No inverted nipple, mass, nipple discharge, skin change or tenderness.     Comments: LEFT: Fibrocystic changes 6 cm from the nipple. Surgical scar present.  RIGHT: No masses, skin changes or nipple discharge.  Inferior fibrocystic changes. Abdominal:     General: Bowel sounds are normal.     Palpations: Abdomen is soft. There is no mass.     Tenderness: There is no abdominal tenderness. There is no guarding or rebound.  Musculoskeletal:        General: No tenderness. Normal range of motion.     Cervical back: Normal range of motion and neck supple.  Lymphadenopathy:     Cervical: No cervical adenopathy.     Upper Body:     Right upper body: No supraclavicular adenopathy.     Left upper body: No supraclavicular adenopathy.  Skin:    General: Skin is warm  and dry.     Coloration: Skin is not pale.     Findings: No erythema or rash.     Comments: Scalp healed. Hair loss improving.  Neurological:     Mental Status: She is alert and oriented to person, place, and time.     Deep Tendon Reflexes: Reflexes are normal and symmetric.  Psychiatric:        Behavior: Behavior normal.        Thought Content: Thought content normal.        Judgment: Judgment normal.    Appointment on 02/06/2020  Component Date Value Ref Range Status  . WBC 02/06/2020 6.4  4.0 - 10.5 K/uL Final  . RBC 02/06/2020 4.66  3.87 - 5.11 MIL/uL Final  . Hemoglobin 02/06/2020 12.6  12.0 - 15.0 g/dL Final  . HCT 02/06/2020 39.6  36 - 46 % Final  . MCV 02/06/2020 85.0  80.0 - 100.0 fL Final  . MCH 02/06/2020 27.0  26.0 - 34.0 pg Final  . MCHC 02/06/2020 31.8  30.0 - 36.0 g/dL Final  . RDW 02/06/2020 14.8  11.5 - 15.5 % Final  . Platelets 02/06/2020 208  150 - 400 K/uL Final  . nRBC 02/06/2020 0.0  0.0 - 0.2 % Final  . Neutrophils Relative % 02/06/2020 29  % Final  . Neutro Abs 02/06/2020 1.9  1.7 - 7.7 K/uL  Final  . Lymphocytes Relative 02/06/2020 59  % Final  . Lymphs Abs 02/06/2020 3.8  0.7 - 4.0 K/uL Final  . Monocytes Relative 02/06/2020 11  % Final  . Monocytes Absolute 02/06/2020 0.7  0 - 1 K/uL Final  . Eosinophils Relative 02/06/2020 1  % Final  . Eosinophils Absolute 02/06/2020 0.0  0 - 0 K/uL Final  . Basophils Relative 02/06/2020 0  % Final  . Basophils Absolute 02/06/2020 0.0  0 - 0 K/uL Final  . Immature Granulocytes 02/06/2020 0  % Final  . Abs Immature Granulocytes 02/06/2020 0.01  0.00 - 0.07 K/uL Final   Performed at [facility], [address] [place], [place] [address]  . Sodium 02/06/2020 138  135 - 145 mmol/L Final  . Potassium 02/06/2020 3.9  3.5 - 5.1 mmol/L Final  . Chloride 02/06/2020 103  98 - 111 mmol/L Final  . CO2 02/06/2020 26  22 - 32 mmol/L Final  . Glucose, Bld 02/06/2020 127* 70 - 99 mg/dL Final   Glucose reference range applies only to samples taken after fasting for at least 8 hours.  . BUN 02/06/2020 21  8 - 23 mg/dL Final  . Creatinine, Ser 02/06/2020 0.87  0.44 - 1.00 mg/dL Final  . Calcium 02/06/2020 8.7* 8.9 - 10.3 mg/dL Final  . Total Protein 02/06/2020 7.2  6.5 - 8.1 g/dL Final  . Albumin 02/06/2020 4.5  3.5 - 5.0 g/dL Final  . AST 02/06/2020 23  15 - 41 U/L Final  . ALT 02/06/2020 18  0 - 44 U/L Final  . Alkaline Phosphatase 02/06/2020 89  38 - 126 U/L Final  . Total Bilirubin 02/06/2020 0.8  0.3 - 1.2 mg/dL Final  . GFR calc non Af Amer 02/06/2020 >60  >60 mL/min Final  . GFR calc Af Amer 02/06/2020 >60  >60 mL/min Final  . Anion gap 02/06/2020 9  5 - 15 Final   Performed at [facility], [address], [place], [place] [address]    [facility] Labs: 06/08/2015:hematocrit of 35.1, hemoglobin 11.7, MCV 81, platelets 225,000,  WBC 17,200 with an [place] of 3100. Absolutle lymphocyte count (ALC) 12,200. Uric acid 8.0 (elevated; last check 7.7- elevated). Creatinine 0.90. LDH 217. 12/05/2015:hematocrit of 34.1,  hemoglobin 10.9, MCV 81, platelets 213,000, WBC 17,700 with an ANC of 3100. ALC 13,600. Uric acid 6.0. Creatinine 0.81. LDH 212. 05/14/2016:hematocrit of 34.0, hemoglobin 11.1, MCV 83, platelets 382,000, WBC 13,800 with an ANC of 4600. ALC 7,700.  06/11/2016: hematocrit of 36.0, hemoglobin 11.5, MCV 82, platelets 213,000, WBC 8,100 with an ANC of 3300. ALC 3,900. Creatinine 0.84. Ferritin 79. Iron saturation 12%. Reticulocyte count 1.1%. Coombs negative. 07/18/2016:hematocrit of 35.6, hemoglobin 11.8, MCV 83, platelets 200,000, WBC 6,900 with an ANC of 2600. ALC 3,600. Creatinine 0.78. LFTs normal. 10/17/2016:hematocrit of 35.9, hemoglobin 11.8, MCV 84, platelets 248,000, WBC 10,500 with an ANC of 3200. ALC 3,600. Creatinine 0.93. LFTs normal. 02/02/2017:hematocrit of 32.2, hemoglobin 10.4, MCV 83, platelets 361,000, WBC 15,700 with an ANC of 7200. ALC 7,200. Creatinine 0.84. LFTs normal. Albumen 3.5. 05/01/2017:hematocrit of34.3, hemoglobin 11.1, MCV79, platelets281,000, WBC9,200 with an [place] of2900. ALC 5,200. Creatinine 0.87. LFTs normal. Albumin 3.5.B12 was 383. Folate 12.6, Ferritin 125. Reticulocyte count 1.9%.   Assessment:  Joanna Phillips is a 69 y.o. female with stage I chronic lymphocytic leukemiadiagnosed in 2011. She was diagnosed with right breast DCIS in 02/2016.  CLL:She presented with mild lymphocytosis and small cervical adenopathy on routine physical exam. CBC revealed only lymphocytosis. 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 SPEP was normal. Chest, abdomen, and pelvic CT scan revealed small diffuse nodes.  DCIS:She underwent wire-guided right partial mastectomy on 03/11/2016 at [facility]. Pathology revealed a 3.9 cm grade III DCIS. ER was strongly positive (Allred score 8) in 100% of cells. PR  was strongly positive (Allred score 6) in 30% of cells. Pathologic stage was TisNx. She completed breast radiationon 06/25/2016.   She began Arimidexon 06/26/2016. She is tolerating Arimidex well.   [facility] on 12/19/2016 revealed no evidence of malignancy. There was interval development of post lumpectomy changes. [facility]ogram[facility] on 12/25/2017 revealed no evidence of malignancy.  Bilateral mammogram and limited right breast ultrasound at [facility] on 12/20/2018 revealed no mammographic or sonographic evidence of malignancy.  Diagnostic bilateral mammogram on 12/22/2019 revealed a 1.3 cm group of amorphous calcifications at the right breast lumpectomy site. There was no evidence of left breast malignancy.  Right breast biopsy on 12/27/2019 revealed benign mammary parenchyma with dense stromal fibrosis, remote fat necrosis, and coarse calcifications. Biopsy was negative for residual atypical proliferative breast disease. Pathology results were CONCORDANT with imaging findings.  She has a history ofintermittent rectal and vaginal bleedingin 2015. She was seen by gynecology at the [facility]. Pelvic exam and ultrasound revealed fibroids.   EGDon 11/11/2011 revealed a hiatal hernia and gastritis. Colonoscopy on 11/11/2011 revealed diverticulosis in the ascending, descending, and sigmoid colon. Stool guaiacs x 3 were negative in 06/2014. She denies any melena or hematochezia.   She has a history of varicella zosterinvolving the right side of her scalp. She was admitted to [facility] from 12/21/2015 - 12/25/2015 with disseminated varicella zoster. She was treated with IV acyclovir then switched to oral valacyclovir (completed 01/10/2016). She is on prophylactic valacyclovir. Scalp has undergone debridement at the wound care center.  Bone densityon 05/21/2016 was normalwith a T score of -0.3 in the AP spine L1-L4 and -0.3 in the left  femoral  neck. Bone densityon 06/16/2018 was normalwith a T-score of -0.9 in the left forearm radius.   Symptomatically, she is doing well.  She has no B symptoms.  She notes some mild residual postherpetic neuralgia.  Exam is stable.  Plan: 1.   Labs today: CBC with diff, CMP. 2.Chronic lymphocytic leukemia Hematocrit 39.6. Hemoglobin 12.6. Platelets 208,000. WBC 6400 (ANC  1900; ALC  8800) She continues to do well.               Continue surveillance.   3.Right breast DCIS Exam reveals no evidence of recurrent disease.               Bilateral mammogram on 12/22/2019 revealed a group of amorphous calcifications in the right breast.    Right breast biopsy on 12/27/2019 was benign and concordant.             Continue Arimidex (started 06/26/2016).    Schedule bilateral diagnostic mammogram 12/21/2020. 4.   Bilateral mammogram 12/21/2020. 5.   RTC in 6 months for MD assessment and labs (CBC with diff, CMP).  I discussed the assessment and treatment plan with the patient.  The patient was provided an opportunity to ask questions and all were answered.  The patient agreed with the plan and demonstrated an understanding of the instructions.  The patient was advised to call back if the symptoms worsen or if the condition fails to improve as anticipated.   Lequita Asal, MD, PhD    02/06/2020, 2:14 PM  I, Jacqualyn Posey, am acting as a Education administrator for [employer]. Mike Gip, MD.   I, Ripken Rekowski C. Mike Gip, MD, have reviewed the above documentation for accuracy and completeness, and I agree with the above.

## 2020-02-06 ENCOUNTER — Other Ambulatory Visit: Payer: Medicare Other

## 2020-02-06 ENCOUNTER — Inpatient Hospital Stay: Payer: Medicare Other | Attending: Hematology and Oncology

## 2020-02-06 ENCOUNTER — Ambulatory Visit: Payer: Medicare Other | Admitting: Hematology and Oncology

## 2020-02-06 ENCOUNTER — Inpatient Hospital Stay (HOSPITAL_BASED_OUTPATIENT_CLINIC_OR_DEPARTMENT_OTHER): Payer: Medicare Other | Admitting: Hematology and Oncology

## 2020-02-06 ENCOUNTER — Other Ambulatory Visit: Payer: Self-pay

## 2020-02-06 ENCOUNTER — Encounter: Payer: Self-pay | Admitting: Hematology and Oncology

## 2020-02-06 VITALS — BP 121/81 | HR 75 | Temp 97.9°F | Wt 219.7 lb

## 2020-02-06 DIAGNOSIS — Z9049 Acquired absence of other specified parts of digestive tract: Secondary | ICD-10-CM | POA: Diagnosis not present

## 2020-02-06 DIAGNOSIS — Z803 Family history of malignant neoplasm of breast: Secondary | ICD-10-CM | POA: Insufficient documentation

## 2020-02-06 DIAGNOSIS — M25579 Pain in unspecified ankle and joints of unspecified foot: Secondary | ICD-10-CM | POA: Insufficient documentation

## 2020-02-06 DIAGNOSIS — Z880 Allergy status to penicillin: Secondary | ICD-10-CM | POA: Insufficient documentation

## 2020-02-06 DIAGNOSIS — C9110 Chronic lymphocytic leukemia of B-cell type not having achieved remission: Secondary | ICD-10-CM

## 2020-02-06 DIAGNOSIS — Z923 Personal history of irradiation: Secondary | ICD-10-CM | POA: Insufficient documentation

## 2020-02-06 DIAGNOSIS — Z833 Family history of diabetes mellitus: Secondary | ICD-10-CM | POA: Diagnosis not present

## 2020-02-06 DIAGNOSIS — Z79811 Long term (current) use of aromatase inhibitors: Secondary | ICD-10-CM | POA: Diagnosis not present

## 2020-02-06 DIAGNOSIS — Z801 Family history of malignant neoplasm of trachea, bronchus and lung: Secondary | ICD-10-CM | POA: Diagnosis not present

## 2020-02-06 DIAGNOSIS — Z8719 Personal history of other diseases of the digestive system: Secondary | ICD-10-CM | POA: Diagnosis not present

## 2020-02-06 DIAGNOSIS — D0511 Intraductal carcinoma in situ of right breast: Secondary | ICD-10-CM

## 2020-02-06 DIAGNOSIS — Z881 Allergy status to other antibiotic agents status: Secondary | ICD-10-CM | POA: Insufficient documentation

## 2020-02-06 DIAGNOSIS — K219 Gastro-esophageal reflux disease without esophagitis: Secondary | ICD-10-CM | POA: Insufficient documentation

## 2020-02-06 DIAGNOSIS — Z79899 Other long term (current) drug therapy: Secondary | ICD-10-CM | POA: Insufficient documentation

## 2020-02-06 DIAGNOSIS — B0229 Other postherpetic nervous system involvement: Secondary | ICD-10-CM | POA: Insufficient documentation

## 2020-02-06 DIAGNOSIS — D259 Leiomyoma of uterus, unspecified: Secondary | ICD-10-CM | POA: Diagnosis not present

## 2020-02-06 DIAGNOSIS — Z885 Allergy status to narcotic agent status: Secondary | ICD-10-CM | POA: Insufficient documentation

## 2020-02-06 DIAGNOSIS — K5730 Diverticulosis of large intestine without perforation or abscess without bleeding: Secondary | ICD-10-CM | POA: Diagnosis not present

## 2020-02-06 DIAGNOSIS — Z8616 Personal history of COVID-19: Secondary | ICD-10-CM | POA: Diagnosis not present

## 2020-02-06 LAB — COMPREHENSIVE METABOLIC PANEL
ALT: 18 U/L (ref 0–44)
AST: 23 U/L (ref 15–41)
Albumin: 4.5 g/dL (ref 3.5–5.0)
Alkaline Phosphatase: 89 U/L (ref 38–126)
Anion gap: 9 (ref 5–15)
BUN: 21 mg/dL (ref 8–23)
CO2: 26 mmol/L (ref 22–32)
Calcium: 8.7 mg/dL — ABNORMAL LOW (ref 8.9–10.3)
Chloride: 103 mmol/L (ref 98–111)
Creatinine, Ser: 0.87 mg/dL (ref 0.44–1.00)
GFR calc Af Amer: >60 mL/min (ref >60)
GFR calc non Af Amer: >60 mL/min (ref >60)
Glucose, Bld: 127 mg/dL — ABNORMAL HIGH (ref 70–99)
Potassium: 3.9 mmol/L (ref 3.5–5.1)
Sodium: 138 mmol/L (ref 135–145)
Total Bilirubin: 0.8 mg/dL (ref 0.3–1.2)
Total Protein: 7.2 g/dL (ref 6.5–8.1)

## 2020-02-06 LAB — CBC WITH DIFFERENTIAL/PLATELET
Abs Immature Granulocytes: 0.01 10*3/uL (ref 0.00–0.07)
Basophils Absolute: 0 10*3/uL (ref 0.0–0.1)
Basophils Relative: 0 %
Eosinophils Absolute: 0 10*3/uL (ref 0.0–0.5)
Eosinophils Relative: 1 %
HCT: 39.6 % (ref 36.0–46.0)
Hemoglobin: 12.6 g/dL (ref 12.0–15.0)
Immature Granulocytes: 0 %
Lymphocytes Relative: 59 %
Lymphs Abs: 3.8 10*3/uL (ref 0.7–4.0)
MCH: 27 pg (ref 26.0–34.0)
MCHC: 31.8 g/dL (ref 30.0–36.0)
MCV: 85 fL (ref 80.0–100.0)
Monocytes Absolute: 0.7 10*3/uL (ref 0.1–1.0)
Monocytes Relative: 11 %
Neutro Abs: 1.9 10*3/uL (ref 1.7–7.7)
Neutrophils Relative %: 29 %
Platelets: 208 10*3/uL (ref 150–400)
RBC: 4.66 MIL/uL (ref 3.87–5.11)
RDW: 14.8 % (ref 11.5–15.5)
WBC: 6.4 10*3/uL (ref 4.0–10.5)
nRBC: 0 % (ref 0.0–0.2)

## 2020-02-06 NOTE — Patient Instructions (Signed)

## 2020-02-06 NOTE — Progress Notes (Signed)
No new changes noted today 

## 2020-02-29 ENCOUNTER — Other Ambulatory Visit: Payer: Self-pay | Admitting: Nurse Practitioner

## 2020-02-29 DIAGNOSIS — K219 Gastro-esophageal reflux disease without esophagitis: Secondary | ICD-10-CM

## 2020-03-29 DIAGNOSIS — Z23 Encounter for immunization: Secondary | ICD-10-CM | POA: Diagnosis not present

## 2020-03-30 DIAGNOSIS — N343 Urethral syndrome, unspecified: Secondary | ICD-10-CM | POA: Diagnosis not present

## 2020-03-30 DIAGNOSIS — R319 Hematuria, unspecified: Secondary | ICD-10-CM | POA: Diagnosis not present

## 2020-03-30 DIAGNOSIS — I8312 Varicose veins of left lower extremity with inflammation: Secondary | ICD-10-CM | POA: Diagnosis not present

## 2020-03-30 DIAGNOSIS — I8311 Varicose veins of right lower extremity with inflammation: Secondary | ICD-10-CM | POA: Diagnosis not present

## 2020-03-30 DIAGNOSIS — R600 Localized edema: Secondary | ICD-10-CM | POA: Diagnosis not present

## 2020-03-30 DIAGNOSIS — M79605 Pain in left leg: Secondary | ICD-10-CM | POA: Diagnosis not present

## 2020-04-06 DIAGNOSIS — I8311 Varicose veins of right lower extremity with inflammation: Secondary | ICD-10-CM | POA: Diagnosis not present

## 2020-04-06 DIAGNOSIS — I8312 Varicose veins of left lower extremity with inflammation: Secondary | ICD-10-CM | POA: Diagnosis not present

## 2020-04-09 DIAGNOSIS — E038 Other specified hypothyroidism: Secondary | ICD-10-CM | POA: Diagnosis not present

## 2020-04-09 DIAGNOSIS — R399 Unspecified symptoms and signs involving the genitourinary system: Secondary | ICD-10-CM | POA: Diagnosis not present

## 2020-04-09 DIAGNOSIS — R7309 Other abnormal glucose: Secondary | ICD-10-CM | POA: Diagnosis not present

## 2020-04-11 DIAGNOSIS — I8311 Varicose veins of right lower extremity with inflammation: Secondary | ICD-10-CM | POA: Diagnosis not present

## 2020-04-11 DIAGNOSIS — I8312 Varicose veins of left lower extremity with inflammation: Secondary | ICD-10-CM | POA: Diagnosis not present

## 2020-04-11 DIAGNOSIS — I83893 Varicose veins of bilateral lower extremities with other complications: Secondary | ICD-10-CM | POA: Diagnosis not present

## 2020-04-17 ENCOUNTER — Other Ambulatory Visit: Payer: Self-pay | Admitting: Hematology and Oncology

## 2020-04-17 DIAGNOSIS — D0511 Intraductal carcinoma in situ of right breast: Secondary | ICD-10-CM

## 2020-04-30 DIAGNOSIS — M25362 Other instability, left knee: Secondary | ICD-10-CM | POA: Diagnosis not present

## 2020-04-30 DIAGNOSIS — M25562 Pain in left knee: Secondary | ICD-10-CM | POA: Diagnosis not present

## 2020-05-16 DIAGNOSIS — M25562 Pain in left knee: Secondary | ICD-10-CM | POA: Diagnosis not present

## 2020-05-16 DIAGNOSIS — M25362 Other instability, left knee: Secondary | ICD-10-CM | POA: Diagnosis not present

## 2020-06-11 ENCOUNTER — Other Ambulatory Visit: Payer: Self-pay | Admitting: Nurse Practitioner

## 2020-06-11 DIAGNOSIS — J301 Allergic rhinitis due to pollen: Secondary | ICD-10-CM

## 2020-06-25 ENCOUNTER — Other Ambulatory Visit: Payer: Self-pay | Admitting: Hematology and Oncology

## 2020-06-25 DIAGNOSIS — Z1231 Encounter for screening mammogram for malignant neoplasm of breast: Secondary | ICD-10-CM

## 2020-08-02 NOTE — Progress Notes (Signed)
[facility]  [address], [address] [place], [place] [address] Phone: [phone]  Fax: [phone]   Clinic Day: 08/06/20  Referring physician: Kirk Ruths, MD  Chief Complaint: Joanna Phillips is a 70 y.o. female with  chronic lymphocytic leukemia (CLL) and right breast DCIS who is seen for 6 month assessment.  HPI: The patient was last seen in the medical oncology clinic on 02/06/2020. At that time, she was doing well.  She had no B symptoms.  She noted some mild residual postherpetic neuralgia.  Exam was stable. Hematocrit was 39.6, hemoglobin 12.6, platelets 208,000, WBC 6,400 (ANC 1900; ALC 3800). Calcium was 8.7. We discussed continued surveillance for CLL.  She continued Arimidex.  During the interim, she has been "okay." Her appetite is good and she denies nausea, vomiting, and diarrhea. She is not sure how she lost 10 lbs because she has been eating sweets. She denies bruising, bleeding, lumps, bumps, and sweats. She is still having some post herpetic neuralgia. Her headaches have persisted. Her joint pain and leg pain have improved. She is still off balance at times.  About a month ago, she woke up with a runny nose and the left side of her neck was swollen. She applied ice to the area and the swelling resolved.  Gabapentin makes her forgetful. She takes carbamazepine. She performs monthly breast self exams. She is taking Arimidex and has no side effects.   Past Medical History:  Diagnosis Date  . Arthritis   . Breast cancer ([place]) 2017   DCIS  . CLL (chronic lymphocytic leukemia) ([place]) 2011  . DCIS (ductal carcinoma in situ) 03/11/2016  . GERD (gastroesophageal reflux disease)   . Personal history of radiation therapy 2017    Past Surgical History:  Procedure Laterality Date  . BREAST BIOPSY Right 02/12/2016   DCIS  . BREAST BIOPSY Right 12/27/2019   path pending, calcs, x marker  . BREAST LUMPECTOMY Right 03/11/2016   DCIS neg  margins  . CHOLECYSTECTOMY  1996  . COLONOSCOPY  2013    Family History  Problem Relation Age of Onset  . Cancer Father        lung  . Diabetes Mother   . Breast cancer Maternal Aunt 60  . Breast cancer Maternal Aunt 70    Social History:  reports that she has never smoked. She has never used smokeless tobacco. She reports that she does not drink alcohol and does not use drugs. She retired from [employer] on 05/26/2017. She lives in [place]. The patient is alone today.   Allergies:  Allergies  Allergen Reactions  . Amoxicillin Hives  . Hydrocodone Itching  . Sulfamethoxazole-Trimethoprim Nausea Only  . Allopurinol Rash    Current Medications: Current Outpatient Medications  Medication Sig Dispense Refill  . anastrozole (ARIMIDEX) 1 MG tablet Take 1 tablet by mouth once daily 90 tablet 0  . aspirin EC 81 MG tablet Take 81 mg by mouth daily.    . calcium-vitamin D (OSCAL WITH D) 500-200 MG-UNIT TABS tablet Take 1 tablet by mouth daily.     . carbamazepine (TEGRETOL) 200 MG tablet carbamazepine 200 mg tablet    . gabapentin (NEURONTIN) 800 MG tablet Take 1.5 tablets (1,200 mg total) by mouth 2 (two) times daily. 270 tablet 1  . omeprazole (PRILOSEC) 20 MG capsule Take 1 capsule by mouth once daily 90 capsule 3  . Zinc Sulfate (ZINC 15 PO) Take by mouth.    . Biotin 1 MG  CAPS Take 1 capsule by mouth daily.  (Patient not taking: Reported on 08/06/2020)    . fluticasone (FLONASE) 50 MCG/ACT nasal spray Place 2 sprays into both nostrils daily. (Patient not taking: Reported on 08/06/2020) 16 g 11   No current facility-administered medications for this visit.    Review of Systems  Constitutional: Positive for weight loss (10 lbs). Negative for chills, diaphoresis, fever and malaise/fatigue.       Feels "good".  HENT: Negative.  Negative for congestion, ear discharge, ear pain, hearing loss, nosebleeds, sinus pain, sore throat and tinnitus.   Eyes: Negative.  Negative for blurred vision  and double vision.  Respiratory: Negative.  Negative for cough, hemoptysis, sputum production and shortness of breath.   Cardiovascular: Positive for leg swelling (bilateral ankles). Negative for chest pain and palpitations.  Gastrointestinal: Negative.  Negative for abdominal pain, blood in stool, constipation, diarrhea, heartburn, melena, nausea and vomiting.  Genitourinary: Negative.  Negative for dysuria, frequency, hematuria and urgency.  Musculoskeletal: Positive for joint pain (bilateral ankles, improved) and myalgias (legs, improved). Negative for back pain and neck pain.  Skin: Negative for itching and rash.  Neurological: Positive for headaches. Negative for dizziness, tingling, sensory change, focal weakness and weakness.       Post herpetic neuralgia. Off balance at times.  Endo/Heme/Allergies: Negative for environmental allergies. Does not bruise/bleed easily.  Psychiatric/Behavioral: Negative.  Negative for depression and memory loss. The patient is not nervous/anxious and does not have insomnia.   All other systems reviewed and are negative.  Performance status (ECOG): 1  Vitals Blood pressure 115/61, pulse 66, temperature (!) 97.2 F (36.2 C), temperature source Tympanic, resp. rate 18, weight 209 lb 10.5 oz (95.1 kg), SpO2 100 %.   Physical Exam Vitals and nursing note reviewed.  Constitutional:      General: She is not in acute distress.    Appearance: She is well-developed. She is not diaphoretic.  HENT:     Head: Normocephalic and atraumatic.     Comments: Short gray hair.    Mouth/Throat:     Mouth: Mucous membranes are moist.     Pharynx: Oropharynx is clear. No oropharyngeal exudate.  Eyes:     General: No scleral icterus.    Extraocular Movements: Extraocular movements intact.     Conjunctiva/sclera: Conjunctivae normal.     Pupils: Pupils are equal, round, and reactive to light.     Comments: Blue eyes.  Neck:     Vascular: No JVD.  Cardiovascular:      Rate and Rhythm: Normal rate and regular rhythm.     Heart sounds: Normal heart sounds. No murmur heard.   Pulmonary:     Effort: Pulmonary effort is normal. No respiratory distress.     Breath sounds: Normal breath sounds. No wheezing or rales.  Chest:     Chest wall: No tenderness.  Breasts:     Right: Skin change (3 x 1.5 cm area of scarring at 1 o clock) present. No swelling, bleeding, mass, tenderness or supraclavicular adenopathy.     Left: Skin change (fibrocystic changes at 6 o clock) present. No swelling, bleeding, mass, tenderness or supraclavicular adenopathy.    Abdominal:     General: Bowel sounds are normal.     Palpations: Abdomen is soft. There is no mass.     Tenderness: There is no abdominal tenderness. There is no guarding or rebound.  Musculoskeletal:        General: Tenderness (BLE, R>L) present. Normal range  of motion.     Cervical back: Normal range of motion and neck supple.     Right lower leg: Edema (chronic) present.     Left lower leg: Edema (chronic) present.  Lymphadenopathy:     Cervical: No cervical adenopathy.     Upper Body:     Right upper body: No supraclavicular adenopathy.     Left upper body: No supraclavicular adenopathy.  Skin:    General: Skin is warm and dry.     Coloration: Skin is not pale.     Findings: No erythema or rash.  Neurological:     Mental Status: She is alert and oriented to person, place, and time.     Deep Tendon Reflexes: Reflexes are normal and symmetric.  Psychiatric:        Behavior: Behavior normal.        Thought Content: Thought content normal.        Judgment: Judgment normal.    Appointment on 08/06/2020  Component Date Value Ref Range Status  . WBC 08/06/2020 7.5  4.0 - 10.5 K/uL Final  . RBC 08/06/2020 4.58  3.87 - 5.11 MIL/uL Final  . Hemoglobin 08/06/2020 12.0  12.0 - 15.0 g/dL Final  . HCT 08/06/2020 38.0  36.0 - 46.0 % Final  . MCV 08/06/2020 83.0  80.0 - 100.0 fL Final  . MCH 08/06/2020 26.2   26.0 - 34.0 pg Final  . MCHC 08/06/2020 31.6  30.0 - 36.0 g/dL Final  . RDW 08/06/2020 15.0  11.5 - 15.5 % Final  . Platelets 08/06/2020 171  150 - 400 K/uL Final  . nRBC 08/06/2020 0.0  0.0 - 0.2 % Final   Performed at [facility], [address], [place], [place] [address]  . Neutrophils Relative % 08/06/2020 PENDING  % Incomplete  . Neutro Abs 08/06/2020 PENDING  1.7 - 7.7 K/uL Incomplete  . Band Neutrophils 08/06/2020 PENDING  % Incomplete  . Lymphocytes Relative 08/06/2020 PENDING  % Incomplete  . Lymphs Abs 08/06/2020 PENDING  0.7 - 4.0 K/uL Incomplete  . Monocytes Relative 08/06/2020 PENDING  % Incomplete  . Monocytes Absolute 08/06/2020 PENDING  0.1 - 1.0 K/uL Incomplete  . Eosinophils Relative 08/06/2020 PENDING  % Incomplete  . Eosinophils Absolute 08/06/2020 PENDING  0.0 - 0.5 K/uL Incomplete  . Basophils Relative 08/06/2020 PENDING  % Incomplete  . Basophils Absolute 08/06/2020 PENDING  0.0 - 0.1 K/uL Incomplete  . WBC Morphology 08/06/2020 PENDING   Incomplete  . RBC Morphology 08/06/2020 PENDING   Incomplete  . Smear Review 08/06/2020 PENDING   Incomplete  . Other 08/06/2020 PENDING  % Incomplete  . nRBC 08/06/2020 PENDING  0 /100 WBC Incomplete  . Metamyelocytes Relative 08/06/2020 PENDING  % Incomplete  . Myelocytes 08/06/2020 PENDING  % Incomplete  . Promyelocytes Relative 08/06/2020 PENDING  % Incomplete  . Blasts 08/06/2020 PENDING  % Incomplete  . Immature Granulocytes 08/06/2020 PENDING  % Incomplete  . Abs Immature Granulocytes 08/06/2020 PENDING  0.00 - 0.07 K/uL Incomplete  . Sodium 08/06/2020 138  135 - 145 mmol/L Final  . Potassium 08/06/2020 4.4  3.5 - 5.1 mmol/L Final  . Chloride 08/06/2020 104  98 - 111 mmol/L Final  . CO2 08/06/2020 26  22 - 32 mmol/L Final  . Glucose, Bld 08/06/2020 139* 70 - 99 mg/dL Final   Glucose reference range applies only to samples taken after fasting for at least 8 hours.  . BUN 08/06/2020 14  8 - 23  mg/dL Final   . Creatinine, Ser 08/06/2020 0.69  0.44 - 1.00 mg/dL Final  . Calcium 08/06/2020 9.0  8.9 - 10.3 mg/dL Final  . Total Protein 08/06/2020 6.9  6.5 - 8.1 g/dL Final  . Albumin 08/06/2020 4.0  3.5 - 5.0 g/dL Final  . AST 08/06/2020 28  15 - 41 U/L Final  . ALT 08/06/2020 23  0 - 44 U/L Final  . Alkaline Phosphatase 08/06/2020 97  38 - 126 U/L Final  . Total Bilirubin 08/06/2020 0.7  0.3 - 1.2 mg/dL Final  . GFR, Estimated 08/06/2020 >60  >60 mL/min Final   Comment: (NOTE) Calculated using the CKD-EPI Creatinine Equation (2021)   . Anion gap 08/06/2020 8  5 - 15 Final   Performed at [facility], [address] [place], [place] [address]    [facility] Labs: 06/08/2015:hematocrit of 35.1, hemoglobin 11.7, MCV 81, platelets 225,000, WBC 17,200 with an ANC of 3100. Absolutle lymphocyte count (ALC) 12,200. Uric acid 8.0 (elevated; last check 7.7- elevated). Creatinine 0.90. LDH 217. 12/05/2015:hematocrit of 34.1, hemoglobin 10.9, MCV 81, platelets 213,000, WBC 17,700 with an ANC of 3100. ALC 13,600. Uric acid 6.0. Creatinine 0.81. LDH 212. 05/14/2016:hematocrit of 34.0, hemoglobin 11.1, MCV 83, platelets 382,000, WBC 13,800 with an ANC of 4600. ALC 7,700.  06/11/2016: hematocrit of 36.0, hemoglobin 11.5, MCV 82, platelets 213,000, WBC 8,100 with an ANC of 3300. ALC 3,900. Creatinine 0.84. Ferritin 79. Iron saturation 12%. Reticulocyte count 1.1%. Coombs negative. 07/18/2016:hematocrit of 35.6, hemoglobin 11.8, MCV 83, platelets 200,000, WBC 6,900 with an ANC of 2600. ALC 3,600. Creatinine 0.78. LFTs normal. 10/17/2016:hematocrit of 35.9, hemoglobin 11.8, MCV 84, platelets 248,000, WBC 10,500 with an ANC of 3200. ALC 3,600. Creatinine 0.93. LFTs normal. 02/02/2017:hematocrit of 32.2, hemoglobin 10.4, MCV 83, platelets 361,000, WBC 15,700 with an ANC of 7200. ALC 7,200. Creatinine 0.84. LFTs normal. Albumen 3.5. 05/01/2017:hematocrit of34.3, hemoglobin  11.1, MCV79, platelets281,000, WBC9,200 with an [place] of2900. ALC 5,200. Creatinine 0.87. LFTs normal. Albumin 3.5.B12 was 383. Folate 12.6, Ferritin 125. Reticulocyte count 1.9%.   Assessment:  Joanna Phillips is a 70 y.o. female with stage I chronic lymphocytic leukemiadiagnosed in 2011. She was diagnosed with right breast DCIS in 02/2016.  CLL:She presented with mild lymphocytosis and small cervical adenopathy on routine physical exam. CBC revealed only lymphocytosis. 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 SPEP was normal. Chest, abdomen, and pelvic CT scan revealed small diffuse nodes.  DCIS:She underwent wire-guided right partial mastectomy on 03/11/2016 at [facility]. Pathology revealed a 3.9 cm grade III DCIS. ER was strongly positive (Allred score 8) in 100% of cells. PR was strongly positive (Allred score 6) in 30% of cells. Pathologic stage was TisNx. She completed breast radiationon 06/25/2016.   She began Arimidexon 06/26/2016. She is tolerating Arimidex well.   [facility] on 12[facility]/2016 revealed no evidence of malignancy. There was interval development of post lumpectomy changes. [facility] on 12/25/2017 revealed no evidence of malignancy.  Bilateral mammogram and limited right breast ultrasound at [facility] on 12/20/2018 revealed no mammographic or sonographic evidence of malignancy.  Diagnostic bilateral mammogram on 12/22/2019 revealed a 1.3 cm group of amorphous calcifications at the right breast lumpectomy site. There was no evidence of left breast malignancy.  Right breast biopsy on 12/27/2019 revealed benign mammary parenchyma with dense stromal fibrosis, remote fat necrosis, and  coarse calcifications. Biopsy was negative for residual atypical proliferative breast disease.  Pathology results were CONCORDANT with imaging findings.  She has a history ofintermittent rectal and vaginal bleedingin 2015. She was seen by gynecology at the [facility]. Pelvic exam and ultrasound revealed fibroids.   EGDon 11/11/2011 revealed a hiatal hernia and gastritis. Colonoscopy on 11/11/2011 revealed diverticulosis in the ascending, descending, and sigmoid colon. Stool guaiacs x 3 were negative in 06/2014. She denies any melena or hematochezia.   She has a history of varicella zosterinvolving the right side of her scalp. She was admitted to [facility] from 12/21/2015 - 12/25/2015 with disseminated varicella zoster. She was treated with IV acyclovir then switched to oral valacyclovir (completed 01/10/2016). She is on prophylactic valacyclovir. Scalp has undergone debridement at the wound care center.  Bone densityon 05/21/2016 was normalwith a T score of -0.3 in the AP spine L1-L4 and -0.3 in the left femoral neck. Bone densityon 06/16/2018 was normalwith a T-score of -0.9 in the left forearm radius.   Symptomatically, she feels "ok." Her appetite is good; she denies nausea, vomiting, and diarrhea. She is not sure how she lost 10 lbs. She still has some post herpetic neuralgia. Her headaches have persisted. Her joint pain and leg pain have improved. She performs monthly breast self exams. She is taking Arimidex without side effects.  Exam is stable.  Plan: 1.   Labs today: CBC with diff, CMP. 2.Chronic lymphocytic leukemia Hematocrit 38.0. Hemoglobin 12.0. Platelets 171,000. WBC 7500 ([place]  2100; ALC  4700) She denies any B symptoms.  Exam reveals no adenopathy or hepatosplenomegaly.               Continue surveillance.   3.Right breast DCIS Clinically, she is doing well.    Exam reveals feels no evidence of recurrent disease.               Bilateral mammogram on 12/22/2019 revealed a group of amorphous calcifications in  the right breast.    Right breast biopsy on 12/27/2019 was benign and concordant.             Continue Arimidex (started 06/26/2016).    Next mammogram on 12/21/2020. 4.   Bone density on 08/13/2020. 5.   Patient has mammogram scheduled for 12/21/2020. 6.   RTC in 6 months for MD assessment, labs (CBC with diff, CMP, LDH), review of mammogram and bone density.  I discussed the assessment and treatment plan with the patient.  The patient was provided an opportunity to ask questions and all were answered.  The patient agreed with the plan and demonstrated an understanding of the instructions.  The patient was advised to call back if the symptoms worsen or if the condition fails to improve as anticipated.   Lequita Asal, MD, PhD    08/06/2020, 10:43 AM   I, Mirian Mo Tufford, am acting as a Education administrator for [employer]. Mike Gip, MD.   I, Ora Mcnatt C. Mike Gip, MD, have reviewed the above documentation for accuracy and completeness, and I agree with the above.

## 2020-08-06 ENCOUNTER — Inpatient Hospital Stay: Payer: Medicare Other | Attending: Hematology and Oncology | Admitting: Hematology and Oncology

## 2020-08-06 ENCOUNTER — Inpatient Hospital Stay: Payer: Medicare Other

## 2020-08-06 ENCOUNTER — Other Ambulatory Visit: Payer: Self-pay

## 2020-08-06 ENCOUNTER — Encounter: Payer: Self-pay | Admitting: Hematology and Oncology

## 2020-08-06 VITALS — BP 115/61 | HR 66 | Temp 97.2°F | Resp 18 | Wt 209.7 lb

## 2020-08-06 DIAGNOSIS — D0511 Intraductal carcinoma in situ of right breast: Secondary | ICD-10-CM | POA: Diagnosis present

## 2020-08-06 DIAGNOSIS — C9110 Chronic lymphocytic leukemia of B-cell type not having achieved remission: Secondary | ICD-10-CM

## 2020-08-06 DIAGNOSIS — Z0000 Encounter for general adult medical examination without abnormal findings: Secondary | ICD-10-CM

## 2020-08-06 LAB — COMPREHENSIVE METABOLIC PANEL
ALT: 23 U/L (ref 0–44)
AST: 28 U/L (ref 15–41)
Albumin: 4 g/dL (ref 3.5–5.0)
Alkaline Phosphatase: 97 U/L (ref 38–126)
Anion gap: 8 (ref 5–15)
BUN: 14 mg/dL (ref 8–23)
CO2: 26 mmol/L (ref 22–32)
Calcium: 9 mg/dL (ref 8.9–10.3)
Chloride: 104 mmol/L (ref 98–111)
Creatinine, Ser: 0.69 mg/dL (ref 0.44–1.00)
GFR, Estimated: >60 mL/min (ref >60)
Glucose, Bld: 139 mg/dL — ABNORMAL HIGH (ref 70–99)
Potassium: 4.4 mmol/L (ref 3.5–5.1)
Sodium: 138 mmol/L (ref 135–145)
Total Bilirubin: 0.7 mg/dL (ref 0.3–1.2)
Total Protein: 6.9 g/dL (ref 6.5–8.1)

## 2020-08-06 LAB — CBC WITH DIFFERENTIAL/PLATELET
Abs Immature Granulocytes: 0.02 10*3/uL (ref 0.00–0.07)
Basophils Absolute: 0 10*3/uL (ref 0.0–0.1)
Basophils Relative: 1 %
Eosinophils Absolute: 0.1 10*3/uL (ref 0.0–0.5)
Eosinophils Relative: 1 %
HCT: 38 % (ref 36.0–46.0)
Hemoglobin: 12 g/dL (ref 12.0–15.0)
Immature Granulocytes: 0 %
Lymphocytes Relative: 62 %
Lymphs Abs: 4.7 10*3/uL — ABNORMAL HIGH (ref 0.7–4.0)
MCH: 26.2 pg (ref 26.0–34.0)
MCHC: 31.6 g/dL (ref 30.0–36.0)
MCV: 83 fL (ref 80.0–100.0)
Monocytes Absolute: 0.6 10*3/uL (ref 0.1–1.0)
Monocytes Relative: 8 %
Neutro Abs: 2.1 10*3/uL (ref 1.7–7.7)
Neutrophils Relative %: 28 %
Platelets: 171 10*3/uL (ref 150–400)
RBC Morphology: NONE SEEN
RBC: 4.58 MIL/uL (ref 3.87–5.11)
RDW: 15 % (ref 11.5–15.5)
WBC Morphology: ABNORMAL
WBC: 7.5 10*3/uL (ref 4.0–10.5)
nRBC: 0 % (ref 0.0–0.2)

## 2020-08-06 NOTE — Progress Notes (Signed)
Patient here for oncology follow-up appointment, expresses concerns of left breast indentation

## 2020-08-09 ENCOUNTER — Other Ambulatory Visit: Payer: Self-pay

## 2020-08-09 ENCOUNTER — Ambulatory Visit
Admission: RE | Admit: 2020-08-09 | Discharge: 2020-08-09 | Disposition: A | Discharge status: Home / Self Care | Payer: Medicare Other | Source: Ambulatory Visit | Attending: Hematology and Oncology | Admitting: Hematology and Oncology

## 2020-08-09 DIAGNOSIS — M85832 Other specified disorders of bone density and structure, left forearm: Secondary | ICD-10-CM | POA: Diagnosis not present

## 2020-08-09 DIAGNOSIS — Z923 Personal history of irradiation: Secondary | ICD-10-CM | POA: Insufficient documentation

## 2020-08-09 DIAGNOSIS — Z13820 Encounter for screening for osteoporosis: Secondary | ICD-10-CM | POA: Insufficient documentation

## 2020-08-09 DIAGNOSIS — Z0000 Encounter for general adult medical examination without abnormal findings: Secondary | ICD-10-CM

## 2020-08-09 DIAGNOSIS — Z780 Asymptomatic menopausal state: Secondary | ICD-10-CM | POA: Diagnosis not present

## 2020-08-09 DIAGNOSIS — D0511 Intraductal carcinoma in situ of right breast: Secondary | ICD-10-CM | POA: Diagnosis present

## 2020-11-14 ENCOUNTER — Other Ambulatory Visit: Payer: Self-pay | Admitting: Neurology

## 2020-11-14 DIAGNOSIS — G500 Trigeminal neuralgia: Secondary | ICD-10-CM

## 2020-11-15 ENCOUNTER — Other Ambulatory Visit: Payer: Self-pay | Admitting: Neurology

## 2020-11-15 ENCOUNTER — Other Ambulatory Visit (HOSPITAL_COMMUNITY): Payer: Self-pay | Admitting: Neurology

## 2020-11-15 DIAGNOSIS — G501 Atypical facial pain: Secondary | ICD-10-CM

## 2020-11-16 ENCOUNTER — Other Ambulatory Visit: Payer: Self-pay | Admitting: Neurology

## 2020-11-16 ENCOUNTER — Other Ambulatory Visit (HOSPITAL_COMMUNITY): Payer: Self-pay | Admitting: Neurology

## 2020-11-16 DIAGNOSIS — R6884 Jaw pain: Secondary | ICD-10-CM

## 2020-11-29 ENCOUNTER — Other Ambulatory Visit: Payer: Self-pay

## 2020-11-29 ENCOUNTER — Ambulatory Visit
Admission: RE | Admit: 2020-11-29 | Discharge: 2020-11-29 | Disposition: A | Discharge status: Home / Self Care | Payer: Medicare Other | Source: Ambulatory Visit | Attending: Neurology | Admitting: Neurology

## 2020-11-29 DIAGNOSIS — R6884 Jaw pain: Secondary | ICD-10-CM | POA: Insufficient documentation

## 2020-11-29 MED ORDER — GADOBUTROL 1 MMOL/ML IV SOLN
9.0000 mL | Freq: Once | INTRAVENOUS | Status: AC | PRN
  Administered 2020-11-29: 9 mL via INTRAVENOUS

## 2020-12-24 ENCOUNTER — Ambulatory Visit
Admission: RE | Admit: 2020-12-24 | Discharge: 2020-12-24 | Disposition: A | Discharge status: Home / Self Care | Payer: Medicare Other | Source: Ambulatory Visit | Attending: Hematology and Oncology | Admitting: Hematology and Oncology

## 2020-12-24 ENCOUNTER — Other Ambulatory Visit: Payer: Self-pay

## 2020-12-24 DIAGNOSIS — Z1231 Encounter for screening mammogram for malignant neoplasm of breast: Secondary | ICD-10-CM

## 2021-01-04 ENCOUNTER — Other Ambulatory Visit: Payer: Self-pay | Admitting: *Deleted

## 2021-01-04 DIAGNOSIS — D0511 Intraductal carcinoma in situ of right breast: Secondary | ICD-10-CM

## 2021-01-04 MED ORDER — ANASTROZOLE 1 MG PO TABS: 1.0000 mg | ORAL_TABLET | Freq: Every day | ORAL | 0 refills | Status: DC

## 2021-01-04 NOTE — Telephone Encounter (Signed)
Former patient of [employer] psychotherapist requesting refill for [employer]. Order pended for MD approval. She has an appointment with you 02/06/2921 she is aware of the change in MD.

## 2021-02-06 ENCOUNTER — Other Ambulatory Visit: Payer: Self-pay

## 2021-02-06 ENCOUNTER — Encounter: Payer: Self-pay | Admitting: Oncology

## 2021-02-06 ENCOUNTER — Inpatient Hospital Stay: Payer: Medicare Other

## 2021-02-06 ENCOUNTER — Inpatient Hospital Stay: Payer: Medicare Other | Attending: Hematology and Oncology | Admitting: Oncology

## 2021-02-06 VITALS — BP 94/48 | HR 71 | Temp 97.6°F | Resp 16 | Wt 184.4 lb

## 2021-02-06 DIAGNOSIS — Z880 Allergy status to penicillin: Secondary | ICD-10-CM | POA: Insufficient documentation

## 2021-02-06 DIAGNOSIS — Z803 Family history of malignant neoplasm of breast: Secondary | ICD-10-CM | POA: Diagnosis not present

## 2021-02-06 DIAGNOSIS — D649 Anemia, unspecified: Secondary | ICD-10-CM | POA: Insufficient documentation

## 2021-02-06 DIAGNOSIS — Z801 Family history of malignant neoplasm of trachea, bronchus and lung: Secondary | ICD-10-CM | POA: Insufficient documentation

## 2021-02-06 DIAGNOSIS — R519 Headache, unspecified: Secondary | ICD-10-CM | POA: Diagnosis not present

## 2021-02-06 DIAGNOSIS — G500 Trigeminal neuralgia: Secondary | ICD-10-CM | POA: Insufficient documentation

## 2021-02-06 DIAGNOSIS — D0511 Intraductal carcinoma in situ of right breast: Secondary | ICD-10-CM | POA: Insufficient documentation

## 2021-02-06 DIAGNOSIS — M8580 Other specified disorders of bone density and structure, unspecified site: Secondary | ICD-10-CM | POA: Insufficient documentation

## 2021-02-06 DIAGNOSIS — C9110 Chronic lymphocytic leukemia of B-cell type not having achieved remission: Secondary | ICD-10-CM | POA: Insufficient documentation

## 2021-02-06 DIAGNOSIS — Z885 Allergy status to narcotic agent status: Secondary | ICD-10-CM | POA: Insufficient documentation

## 2021-02-06 DIAGNOSIS — Z923 Personal history of irradiation: Secondary | ICD-10-CM | POA: Diagnosis not present

## 2021-02-06 DIAGNOSIS — Z79899 Other long term (current) drug therapy: Secondary | ICD-10-CM | POA: Insufficient documentation

## 2021-02-06 DIAGNOSIS — Z0000 Encounter for general adult medical examination without abnormal findings: Secondary | ICD-10-CM

## 2021-02-06 DIAGNOSIS — E038 Other specified hypothyroidism: Secondary | ICD-10-CM | POA: Diagnosis not present

## 2021-02-06 DIAGNOSIS — Z9049 Acquired absence of other specified parts of digestive tract: Secondary | ICD-10-CM | POA: Insufficient documentation

## 2021-02-06 DIAGNOSIS — R5383 Other fatigue: Secondary | ICD-10-CM | POA: Insufficient documentation

## 2021-02-06 DIAGNOSIS — Z881 Allergy status to other antibiotic agents status: Secondary | ICD-10-CM | POA: Diagnosis not present

## 2021-02-06 DIAGNOSIS — Z833 Family history of diabetes mellitus: Secondary | ICD-10-CM | POA: Insufficient documentation

## 2021-02-06 DIAGNOSIS — R6889 Other general symptoms and signs: Secondary | ICD-10-CM | POA: Diagnosis not present

## 2021-02-06 LAB — CBC WITH DIFFERENTIAL/PLATELET
Abs Immature Granulocytes: 0.02 10*3/uL (ref 0.00–0.07)
Basophils Absolute: 0 10*3/uL (ref 0.0–0.1)
Basophils Relative: 1 %
Eosinophils Absolute: 0 10*3/uL (ref 0.0–0.5)
Eosinophils Relative: 0 %
HCT: 34.3 % — ABNORMAL LOW (ref 36.0–46.0)
Hemoglobin: 11.4 g/dL — ABNORMAL LOW (ref 12.0–15.0)
Immature Granulocytes: 0 %
Lymphocytes Relative: 63 %
Lymphs Abs: 3.7 10*3/uL (ref 0.7–4.0)
MCH: 26.6 pg (ref 26.0–34.0)
MCHC: 33.2 g/dL (ref 30.0–36.0)
MCV: 80.1 fL (ref 80.0–100.0)
Monocytes Absolute: 0.4 10*3/uL (ref 0.1–1.0)
Monocytes Relative: 7 %
Neutro Abs: 1.7 10*3/uL (ref 1.7–7.7)
Neutrophils Relative %: 29 %
Platelets: 172 10*3/uL (ref 150–400)
RBC: 4.28 MIL/uL (ref 3.87–5.11)
RDW: 15.6 % — ABNORMAL HIGH (ref 11.5–15.5)
WBC: 5.9 10*3/uL (ref 4.0–10.5)
nRBC: 0 % (ref 0.0–0.2)

## 2021-02-06 LAB — COMPREHENSIVE METABOLIC PANEL
ALT: 43 U/L (ref 0–44)
AST: 51 U/L — ABNORMAL HIGH (ref 15–41)
Albumin: 4 g/dL (ref 3.5–5.0)
Alkaline Phosphatase: 103 U/L (ref 38–126)
Anion gap: 7 (ref 5–15)
BUN: 22 mg/dL (ref 8–23)
CO2: 24 mmol/L (ref 22–32)
Calcium: 8.7 mg/dL — ABNORMAL LOW (ref 8.9–10.3)
Chloride: 103 mmol/L (ref 98–111)
Creatinine, Ser: 0.78 mg/dL (ref 0.44–1.00)
GFR, Estimated: >60 mL/min (ref >60)
Glucose, Bld: 129 mg/dL — ABNORMAL HIGH (ref 70–99)
Potassium: 3.7 mmol/L (ref 3.5–5.1)
Sodium: 134 mmol/L — ABNORMAL LOW (ref 135–145)
Total Bilirubin: 0.9 mg/dL (ref 0.3–1.2)
Total Protein: 6.4 g/dL — ABNORMAL LOW (ref 6.5–8.1)

## 2021-02-06 LAB — LACTATE DEHYDROGENASE: LDH: 271 U/L — ABNORMAL HIGH (ref 98–192)

## 2021-02-06 NOTE — Progress Notes (Signed)
Hematology/Oncology Consult note [facility]  [phone] [phone]  Patient Care Team: Kirk Ruths, MD as PCP - General (Internal Medicine) Christene Lye, MD (General Surgery) Schermerhorn, Gwen Her, MD as Referring Physician (Obstetrics and Gynecology) Silvio Pate Nona Dell, MD as Referring Physician (General Surgery)   Name of the patient: Joanna Phillips  [id number]  [birth year]   Date of visit: 02/06/21  Diagnosis-history of right breast DCIS on Arimidex Stage 0 CLL currently under observation  Chief complaint/ Reason for visit-routine follow-up of DCIS and CLL  Heme/Onc history: Patient is a 70 year old female diagnosed with right breast DCIS ER positive in October 2017.Pathology showed 3.9 cm grade 3 DCISOn lumpectomy.  She completed adjuvant radiation treatment and started Arimidex In February 2018.  Patient also has a history of CLL diagnosed by flow cytometry in 2011 when she presented with lymphocytosis.Flow cytometry on 04/18/2010 noted atypical small cell lymphoma, could not rule out mantle cell lymphoma.  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  SPEP was normal.  Chest, abdomen, and pelvic CT scan revealed small diffuse nodes.  She has been under observation for her CLL and has not required any treatment so far.  Interval history-patient continues to have problems with headache and right-sided facial pain. She was diagnosed with possible trigeminal neuralgia by neurosurgeryAnd was seen at [facility].  She will likely be going for microvascular decompression surgery Tolerating Arimidex well without any significant side effects.  States that her cervical lymph nodes occasionally swell up when she gets her attacks of trigeminal neuralgia and then subside.  ECOG PS- 1 Pain scale- 3 Opioid associated constipation- no  Review of systems- Review of Systems   Constitutional:  Positive for malaise/fatigue. Negative for chills, fever and weight loss.  HENT:  Negative for congestion, ear discharge and nosebleeds.   Eyes:  Negative for blurred vision.  Respiratory:  Negative for cough, hemoptysis, sputum production, shortness of breath and wheezing.   Cardiovascular:  Negative for chest pain, palpitations, orthopnea and claudication.  Gastrointestinal:  Negative for abdominal pain, blood in stool, constipation, diarrhea, heartburn, melena, nausea and vomiting.  Genitourinary:  Negative for dysuria, flank pain, frequency, hematuria and urgency.  Musculoskeletal:  Negative for back pain, joint pain and myalgias.  Skin:  Negative for rash.  Neurological:  Positive for headaches. Negative for dizziness, tingling, focal weakness, seizures and weakness.  Endo/Heme/Allergies:  Does not bruise/bleed easily.  Psychiatric/Behavioral:  Negative for depression and suicidal ideas. The patient does not have insomnia.     Allergies  Allergen Reactions   Amoxicillin Hives   Hydrocodone Itching   Sulfamethoxazole-Trimethoprim Nausea Only   Allopurinol Rash     Past Medical History:  Diagnosis Date   Arthritis    Breast cancer ([place]) 2017   DCIS   CLL (chronic lymphocytic leukemia) ([place]) 2011   DCIS (ductal carcinoma in situ) 03/11/2016   GERD (gastroesophageal reflux disease)    Personal history of radiation therapy 2017     Past Surgical History:  Procedure Laterality Date   BREAST BIOPSY Right 02/12/2016   DCIS   BREAST BIOPSY Right 12/27/2019   path pending, calcs, x marker   BREAST LUMPECTOMY Right 03/11/2016   DCIS neg margins   CHOLECYSTECTOMY  1996   COLONOSCOPY  2013    Social History   Socioeconomic History   Marital status: Married    Spouse name: Not on file   Number of  children: Not on file   Years of education: Not on file   Highest education level: Not on file  Occupational History   Occupation: retired   Tobacco Use    Smoking status: Never   Smokeless tobacco: Never  Vaping Use   Vaping Use: Never used  Substance and Sexual Activity   Alcohol use: No   Drug use: No   Sexual activity: Not on file  Other Topics Concern   Not on file  Social History Narrative   Not on file   Social Determinants of Health   Financial Resource Strain: Not on file  Food Insecurity: Not on file  Transportation Needs: Not on file  Physical Activity: Not on file  Stress: Not on file  Social Connections: Not on file  Intimate Partner Violence: Not on file    Family History  Problem Relation Age of Onset   Cancer Father        lung   Diabetes Mother    Breast cancer Maternal Aunt 7   Breast cancer Maternal Aunt 51     Current Outpatient Medications:    acetaminophen (TYLENOL) 650 MG CR tablet, Take by mouth., Disp: , Rfl:    anastrozole (ARIMIDEX) 1 MG tablet, Take 1 tablet (1 mg total) by mouth daily., Disp: 90 tablet, Rfl: 0   DULoxetine (CYMBALTA) 20 MG capsule, Take by mouth., Disp: , Rfl:    fluticasone (FLONASE) 50 MCG/ACT nasal spray, Place into the nose., Disp: , Rfl:    gabapentin (NEURONTIN) 800 MG tablet, Take 1.5 tablets (1,200 mg total) by mouth 2 (two) times daily., Disp: 270 tablet, Rfl: 1   omeprazole (PRILOSEC) 20 MG capsule, Take 1 capsule by mouth once daily, Disp: 90 capsule, Rfl: 3   polyethylene glycol-electrolytes (NULYTELY) 420 g solution, Take by mouth., Disp: , Rfl:    aspirin EC 81 MG tablet, Take 81 mg by mouth daily. (Patient not taking: Reported on 02/06/2021), Disp: , Rfl:    Biotin 1 MG CAPS, Take 1 capsule by mouth daily.  (Patient not taking: No sig reported), Disp: , Rfl:    calcium-vitamin D (OSCAL WITH D) 500-200 MG-UNIT TABS tablet, Take 1 tablet by mouth daily.  (Patient not taking: Reported on 02/06/2021), Disp: , Rfl:    carbamazepine (TEGRETOL) 200 MG tablet, carbamazepine 200 mg tablet, Disp: , Rfl:    traMADol (ULTRAM) 50 MG tablet, SMARTSIG:1 Tablet(s) By Mouth Every  12 Hours PRN (Patient not taking: Reported on 02/06/2021), Disp: , Rfl:    valACYclovir (VALTREX) 1000 MG tablet, Take 1,000 mg by mouth 3 (three) times daily. (Patient not taking: Reported on 02/06/2021), Disp: , Rfl:    Zinc Sulfate (ZINC 15 PO), Take by mouth. (Patient not taking: Reported on 02/06/2021), Disp: , Rfl:   Physical exam:  Vitals:   02/06/21 0957  BP: (!) 94/48  Pulse: 71  Resp: 16  Temp: 97.6 F (36.4 C)  SpO2: 96%  Weight: 184 lb 6.6 oz (83.7 kg)   Physical Exam Constitutional:      General: She is not in acute distress. Cardiovascular:     Rate and Rhythm: Normal rate and regular rhythm.     Heart sounds: Normal heart sounds.  Pulmonary:     Effort: Pulmonary effort is normal.     Breath sounds: Normal breath sounds.  Abdominal:     General: Bowel sounds are normal.     Palpations: Abdomen is soft.  Lymphadenopathy:     Comments: No palpable cervical,  supraclavicular, axillary or inguinal adenopathy    Skin:    General: Skin is warm and dry.  Neurological:     Mental Status: She is alert and oriented to person, place, and time.     CMP Latest Ref Rng & Units 02/06/2021  Glucose 70 - 99 mg/dL 129(H)  BUN 8 - 23 mg/dL 22  Creatinine 0.44 - 1.00 mg/dL 0.78  Sodium 135 - 145 mmol/L 134(L)  Potassium 3.5 - 5.1 mmol/L 3.7  Chloride 98 - 111 mmol/L 103  CO2 22 - 32 mmol/L 24  Calcium 8.9 - 10.3 mg/dL 8.7(L)  Total Protein 6.5 - 8.1 g/dL 6.4(L)  Total Bilirubin 0.3 - 1.2 mg/dL 0.9  Alkaline Phos 38 - 126 U/L 103  AST 15 - 41 U/L 51(H)  ALT 0 - 44 U/L 43   CBC Latest Ref Rng & Units 02/06/2021  WBC 4.0 - 10.5 K/uL 5.9  Hemoglobin 12.0 - 15.0 g/dL 11.4(L)  Hematocrit 36.0 - 46.0 % 34.3(L)  Platelets 150 - 400 K/uL 172      Assessment and plan- Patient is a 70 y.o. female who is here for follow-up of following issues:  Stage 0 CLL: Her white cell count is normal.  Mild anemia overall stable.  Platelet counts are normal.  No palpable adenopathy or  splenomegaly.  From her CLL standpoint she is doing well and does not require any treatment at this time.  She can also proceed with her microvascular decompression surgery for trigeminal neuralgia from her CLL standpoint Right breast DCIS ER positive: She is currently on Arimidex and will complete 5 years in February 2023.  She did have a recent bone density scan which showed mild worsening of her bone health and osteopenia which does not require any bisphosphonates at this time.  I have encouraged her to take her calcium and vitamin D supplements 600 mg / 400 international units twice daily I will see her back in 6 months for breast exam with labs CBC with differential CMP and LDH   Visit Diagnosis 1. CLL (chronic lymphocytic leukemia) ([place])   2. Healthcare maintenance   3. Subclinical hypothyroidism   4. Other general symptoms and signs       Dr. Randa Evens, MD, MPH [facility] [phone] 02/06/2021 12:44 PM

## 2021-04-19 ENCOUNTER — Ambulatory Visit: Payer: Medicare Other | Admitting: Anesthesiology

## 2021-04-19 ENCOUNTER — Encounter: Payer: Self-pay | Admitting: *Deleted

## 2021-04-19 ENCOUNTER — Ambulatory Visit
Admission: RE | Admit: 2021-04-19 | Discharge: 2021-04-19 | Disposition: A | Discharge status: Home / Self Care | Payer: Medicare Other | Attending: Gastroenterology | Admitting: Gastroenterology

## 2021-04-19 ENCOUNTER — Encounter
Admission: RE | Disposition: A | Discharge status: Home / Self Care | Payer: Self-pay | Source: Home / Self Care | Attending: Gastroenterology

## 2021-04-19 DIAGNOSIS — Z923 Personal history of irradiation: Secondary | ICD-10-CM | POA: Insufficient documentation

## 2021-04-19 DIAGNOSIS — D122 Benign neoplasm of ascending colon: Secondary | ICD-10-CM | POA: Insufficient documentation

## 2021-04-19 DIAGNOSIS — D125 Benign neoplasm of sigmoid colon: Secondary | ICD-10-CM | POA: Diagnosis not present

## 2021-04-19 DIAGNOSIS — D124 Benign neoplasm of descending colon: Secondary | ICD-10-CM | POA: Diagnosis not present

## 2021-04-19 DIAGNOSIS — K219 Gastro-esophageal reflux disease without esophagitis: Secondary | ICD-10-CM | POA: Diagnosis not present

## 2021-04-19 DIAGNOSIS — Z856 Personal history of leukemia: Secondary | ICD-10-CM | POA: Diagnosis not present

## 2021-04-19 DIAGNOSIS — K640 First degree hemorrhoids: Secondary | ICD-10-CM | POA: Diagnosis not present

## 2021-04-19 DIAGNOSIS — D120 Benign neoplasm of cecum: Secondary | ICD-10-CM | POA: Diagnosis not present

## 2021-04-19 DIAGNOSIS — Z86000 Personal history of in-situ neoplasm of breast: Secondary | ICD-10-CM | POA: Insufficient documentation

## 2021-04-19 DIAGNOSIS — K5730 Diverticulosis of large intestine without perforation or abscess without bleeding: Secondary | ICD-10-CM | POA: Insufficient documentation

## 2021-04-19 DIAGNOSIS — Z1211 Encounter for screening for malignant neoplasm of colon: Secondary | ICD-10-CM | POA: Diagnosis not present

## 2021-04-19 DIAGNOSIS — Z9049 Acquired absence of other specified parts of digestive tract: Secondary | ICD-10-CM | POA: Diagnosis not present

## 2021-04-19 HISTORY — PX: COLONOSCOPY WITH PROPOFOL: SHX5780

## 2021-04-19 SURGERY — COLONOSCOPY WITH PROPOFOL
Anesthesia: General

## 2021-04-19 MED ORDER — PROPOFOL 500 MG/50ML IV EMUL
INTRAVENOUS | Status: DC | PRN
  Administered 2021-04-19: 150 ug/kg/min via INTRAVENOUS

## 2021-04-19 MED ORDER — LIDOCAINE HCL (CARDIAC) PF 100 MG/5ML IV SOSY
PREFILLED_SYRINGE | INTRAVENOUS | Status: DC | PRN
  Administered 2021-04-19: 40 mg via INTRAVENOUS

## 2021-04-19 MED ORDER — EPHEDRINE SULFATE 50 MG/ML IJ SOLN
INTRAMUSCULAR | Status: DC | PRN
  Administered 2021-04-19: 10 mg via INTRAVENOUS
  Administered 2021-04-19: 5 mg via INTRAVENOUS
  Administered 2021-04-19: 10 mg via INTRAVENOUS

## 2021-04-19 MED ORDER — GLYCOPYRROLATE 0.2 MG/ML IJ SOLN
INTRAMUSCULAR | Status: DC | PRN
  Administered 2021-04-19: .2 mg via INTRAVENOUS

## 2021-04-19 MED ORDER — SODIUM CHLORIDE 0.9 % IV SOLN: INTRAVENOUS | Status: DC

## 2021-04-19 MED ORDER — PROPOFOL 500 MG/50ML IV EMUL
INTRAVENOUS | Status: AC
  Filled 2021-04-19: qty 50

## 2021-04-19 MED ORDER — EPHEDRINE 5 MG/ML INJ
INTRAVENOUS | Status: AC
  Filled 2021-04-19: qty 5

## 2021-04-19 MED ORDER — PHENYLEPHRINE 40 MCG/ML (10ML) SYRINGE FOR IV PUSH (FOR BLOOD PRESSURE SUPPORT)
PREFILLED_SYRINGE | INTRAVENOUS | Status: DC | PRN
  Administered 2021-04-19 (×3): 80 ug via INTRAVENOUS

## 2021-04-19 MED ORDER — PROPOFOL 10 MG/ML IV BOLUS
INTRAVENOUS | Status: DC | PRN
  Administered 2021-04-19: 80 mg via INTRAVENOUS

## 2021-04-19 NOTE — Anesthesia Preprocedure Evaluation (Signed)
Anesthesia Evaluation  Patient identified by MRN, date of birth, ID band Patient awake    Reviewed: Allergy & Precautions, NPO status , Patient's Chart, lab work & pertinent test results  Airway Mallampati: II  TM Distance: >3 FB Neck ROM: Full    Dental  (+) Teeth Intact   Pulmonary neg pulmonary ROS,    Pulmonary exam normal        Cardiovascular Exercise Tolerance: Good negative cardio ROS Normal cardiovascular exam Rhythm:Regular     Neuro/Psych negative neurological ROS  negative psych ROS   GI/Hepatic negative GI ROS, Neg liver ROS, GERD  ,  Endo/Other  negative endocrine ROSHypothyroidism   Renal/GU negative Renal ROS  negative genitourinary   Musculoskeletal  (+) Arthritis ,   Abdominal Normal abdominal exam  (+)   Peds negative pediatric ROS (+)  Hematology negative hematology ROS (+) Blood dyscrasia, anemia ,   Anesthesia Other Findings Past Medical History: No date: Arthritis 2017: Breast cancer ([place])     Comment:  DCIS 2011: CLL (chronic lymphocytic leukemia) ([place]) 03/11/2016: DCIS (ductal carcinoma in situ) No date: GERD (gastroesophageal reflux disease) 2017: Personal history of radiation therapy  Past Surgical History: 02/12/2016: BREAST BIOPSY; Right     Comment:  DCIS 12/27/2019: BREAST BIOPSY; Right     Comment:  path pending, calcs, x marker 03/11/2016: BREAST LUMPECTOMY; Right     Comment:  DCIS neg margins 1996: CHOLECYSTECTOMY 2013: COLONOSCOPY     Reproductive/Obstetrics negative OB ROS                             Anesthesia Physical Anesthesia Plan  ASA: 2  Anesthesia Plan: General   Post-op Pain Management:    Induction: Intravenous  PONV Risk Score and Plan: Propofol infusion and TIVA  Airway Management Planned: Natural Airway and Nasal Cannula  Additional Equipment:   Intra-op Plan:   Post-operative Plan:   Informed Consent: I  have reviewed the patients History and Physical, chart, labs and discussed the procedure including the risks, benefits and alternatives for the proposed anesthesia with the patient or authorized representative who has indicated his/her understanding and acceptance.     Dental Advisory Given  Plan Discussed with: CRNA and Surgeon  Anesthesia Plan Comments:         Anesthesia Quick Evaluation

## 2021-04-19 NOTE — Interval H&P Note (Signed)
History and Physical Interval Note: Preprocedure H&P from 04/19/21  was reviewed and there was no interval change after seeing and examining the patient.  Written consent was obtained from the patient after discussion of risks, benefits, and alternatives. Patient has consented to proceed with Colonoscopy with possible intervention   04/19/2021 8:32 AM  Joanna Phillips  has presented today for surgery, with the diagnosis of Z12.11 Colon Cancer Screening.  The various methods of treatment have been discussed with the patient and family. After consideration of risks, benefits and other options for treatment, the patient has consented to  Procedure(s): COLONOSCOPY WITH PROPOFOL (N/A) as a surgical intervention.  The patient's history has been reviewed, patient examined, no change in status, stable for surgery.  I have reviewed the patient's chart and labs.  Questions were answered to the patient's satisfaction.     Annamaria Helling

## 2021-04-19 NOTE — Transfer of Care (Signed)
Immediate Anesthesia Transfer of Care Note  Patient: Joanna Phillips  Procedure(s) Performed: COLONOSCOPY WITH PROPOFOL  Patient Location: PACU and Endoscopy Unit  Anesthesia Type:General  Level of Consciousness: awake, drowsy and patient cooperative  Airway & Oxygen Therapy: Patient Spontanous Breathing  Post-op Assessment: Report given to RN and Post -op Vital signs reviewed and stable  Post vital signs: Reviewed and stable  Last Vitals:  Vitals Value Taken Time  BP 125/87 04/19/21 0923  Temp    Pulse 65 04/19/21 0923  Resp 17 04/19/21 0923  SpO2 100 % 04/19/21 0923  Vitals shown include unvalidated device data.  Last Pain:  Vitals:   04/19/21 0751  TempSrc: Temporal         Complications: No notable events documented.

## 2021-04-19 NOTE — Anesthesia Procedure Notes (Signed)
Date/Time: 04/19/2021 8:40 AM Performed by: Doreen Salvage, CRNA Pre-anesthesia Checklist: Patient identified, Emergency Drugs available, Suction available and Patient being monitored Patient Re-evaluated:Patient Re-evaluated prior to induction Oxygen Delivery Method: Nasal cannula Induction Type: IV induction Dental Injury: Teeth and Oropharynx as per pre-operative assessment  Comments: Nasal cannula with etCO2 monitoring

## 2021-04-19 NOTE — Op Note (Signed)
[facility] Gastroenterology Patient Name: Joanna Phillips Procedure Date: 04/19/2021 8:26 AM MRN: [id number] Account #: [id number] Date of Birth: [birth year] Admit Type: Outpatient Age: 70 Room: [facility] ENDO ROOM 1 Gender: Female Note Status: Finalized Instrument Name: Colonoscope [id number] Procedure:             Colonoscopy Indications:           Screening for colorectal malignant neoplasm Providers:             Rueben Bash, DO Referring MD:          Ocie Cornfield. Ouida Sills MD, MD (Referring MD) Medicines:             Monitored Anesthesia Care Complications:         No immediate complications. Estimated blood loss:                         Minimal. Procedure:             Pre-Anesthesia Assessment:                        - Prior to the procedure, a History and Physical was                         performed, and patient medications and allergies were                         reviewed. The patient is competent. The risks and                         benefits of the procedure and the sedation options and                         risks were discussed with the patient. All questions                         were answered and informed consent was obtained.                         Patient identification and proposed procedure were                         verified by the physician, the nurse, the anesthetist                         and the technician in the endoscopy suite. Mental                         Status Examination: alert and oriented. Airway                         Examination: normal oropharyngeal airway and neck                         mobility. Respiratory Examination: clear to                         auscultation. CV Examination: RRR, no murmurs, no S3  or S4. Prophylactic Antibiotics: The patient does not                         require prophylactic antibiotics. Prior                         Anticoagulants: The patient has taken no previous                          anticoagulant or antiplatelet agents. ASA Grade                         Assessment: II - A patient with mild systemic disease.                         After reviewing the risks and benefits, the patient                         was deemed in satisfactory condition to undergo the                         procedure. The anesthesia plan was to use monitored                         anesthesia care (MAC). Immediately prior to                         administration of medications, the patient was                         re-assessed for adequacy to receive sedatives. The                         heart rate, respiratory rate, oxygen saturations,                         blood pressure, adequacy of pulmonary ventilation, and                         response to care were monitored throughout the                         procedure. The physical status of the patient was                         re-assessed after the procedure.                        After obtaining informed consent, the colonoscope was                         passed under direct vision. Throughout the procedure,                         the patient's blood pressure, pulse, and oxygen                         saturations were monitored continuously. The  Colonoscope was introduced through the anus and                         advanced to the the cecum, identified by appendiceal                         orifice and ileocecal valve. The colonoscopy was                         performed without difficulty. The patient tolerated                         the procedure well. The quality of the bowel                         preparation was evaluated using the BBPS [facility] Bowel                         Preparation Scale) with scores of: Right Colon = 3,                         Transverse Colon = 3 and Left Colon = 3 (entire mucosa                         seen well with no residual staining, small fragments                          of stool or opaque liquid). The total BBPS score                         equals 9. The ileocecal valve, appendiceal orifice,                         and rectum were photographed. Findings:      The perianal and digital rectal examinations were normal. Pertinent       negatives include normal sphincter tone.      Diverticula were found in the entire colon. Estimated blood loss: none.      Non-bleeding internal hemorrhoids were found during retroflexion. The       hemorrhoids were Grade I (internal hemorrhoids that do not prolapse).       Estimated blood loss: none.      Two sessile polyps were found in the descending colon. The polyps were 4       to 6 mm in size. These polyps were removed with a cold snare. Resection       and retrieval were complete. Estimated blood loss was minimal.      A 1 to 2 mm polyp was found in the cecum. The polyp was sessile. The       polyp was removed with a cold biopsy forceps. Resection and retrieval       were complete. Estimated blood loss was minimal.      A 5 to 6 mm polyp was found in the ascending colon. The polyp was       sessile. The polyp was removed with a cold snare. Resection and       retrieval were complete. Estimated blood loss was minimal.      Three sessile  polyps were found in the sigmoid colon. The polyps were 4       to 6 mm in size. These polyps were removed with a cold snare. Resection       and retrieval were complete. Estimated blood loss was minimal.      The exam was otherwise without abnormality on direct and retroflexion       views. Impression:            - Diverticulosis in the entire examined colon.                        - Non-bleeding internal hemorrhoids.                        - Two 4 to 6 mm polyps in the descending colon,                         removed with a cold snare. Resected and retrieved.                        - One 1 to 2 mm polyp in the cecum, removed with a                          cold biopsy forceps. Resected and retrieved.                        - One 5 to 6 mm polyp in the ascending colon, removed                         with a cold snare. Resected and retrieved.                        - Three 4 to 6 mm polyps in the sigmoid colon, removed                         with a cold snare. Resected and retrieved.                        - The examination was otherwise normal on direct and                         retroflexion views. Recommendation:        - Discharge patient to home.                        - Resume previous diet.                        - Continue present medications.                        - No aspirin, ibuprofen, naproxen, or other                         non-steroidal anti-inflammatory drugs for 5 days after                         polyp removal.                        -  Await pathology results.                        - Repeat colonoscopy for surveillance based on                         pathology results.                        - Return to referring physician as previously                         scheduled. Procedure Code(s):     --- Professional ---                        [phone], Colonoscopy, flexible; with removal of                         tumor(s), polyp(s), or other lesion(s) by snare                         technique                        45380, 43, Colonoscopy, flexible; with biopsy, single                         or multiple Diagnosis Code(s):     --- Professional ---                        K63.5, Polyp of colon                        Z12.11, Encounter for screening for malignant neoplasm                         of colon                        K64.0, First degree hemorrhoids                        K57.30, Diverticulosis of large intestine without                         perforation or abscess without bleeding CPT copyright 2019 [employer] Medical Association. All rights reserved. The codes documented in this report are preliminary and upon  coder review may  be revised to meet current compliance requirements. Attending Participation:      I personally performed the entire procedure. Volney American, DO Annamaria Helling DO, DO 04/19/2021 9:23:10 AM This report has been signed electronically. Number of Addenda: 0 Note Initiated On: 04/19/2021 8:26 AM Scope Withdrawal Time: 0 hours 26 minutes 57 seconds  Total Procedure Duration: 0 hours 32 minutes 37 seconds  Estimated Blood Loss:  Estimated blood loss was minimal.      [facility]

## 2021-04-19 NOTE — H&P (Signed)
Pre-Procedure H&P   Patient ID: Joanna Phillips is a 70 y.o. female.  Gastroenterology Provider: Annamaria Helling, DO  Referring Provider: Dr. Ouida Sills PCP: Kirk Ruths, MD  Date: 04/19/2021  HPI Ms. Joanna Phillips is a 70 y.o. female who presents today for Colonoscopy for screening colonoscopy (10 year).  H/o ductal ca insitu of breast, CLL, lymphedema, anemia. No primary relatives with polyps or colorectal cancer.  BM normal w/o blood, melena, diarrhea, constipation.  No other acute gi complaints.  10/2011- Dr. Candace Cruise- Colonoscopy w/o polyps 11/2003 - Dr. Sonny Masters colonoscopy- w/o polyps   Past Medical History:  Diagnosis Date   Arthritis    Breast cancer ([place]) 2017   DCIS   CLL (chronic lymphocytic leukemia) ([place]) 2011   DCIS (ductal carcinoma in situ) 03/11/2016   GERD (gastroesophageal reflux disease)    Personal history of radiation therapy 2017    Past Surgical History:  Procedure Laterality Date   BREAST BIOPSY Right 02/12/2016   DCIS   BREAST BIOPSY Right 12/27/2019   path pending, calcs, x marker   BREAST LUMPECTOMY Right 03/11/2016   DCIS neg margins   CHOLECYSTECTOMY  1996   COLONOSCOPY  2013   squamous cell skin on scalp surgery Right     Family History No h/o GI disease or malignancy  Review of Systems  Constitutional:  Negative for activity change, appetite change, chills, diaphoresis, fatigue, fever and unexpected weight change.  HENT:  Negative for trouble swallowing and voice change.   Respiratory:  Negative for shortness of breath and wheezing.   Cardiovascular:  Negative for chest pain, palpitations and leg swelling.  Gastrointestinal:  Negative for abdominal distention, abdominal pain, anal bleeding, blood in stool, constipation, diarrhea, nausea, rectal pain and vomiting.  Musculoskeletal:  Negative for arthralgias and myalgias.  Skin:  Negative for color change and pallor.  Neurological:  Negative for dizziness, syncope and  weakness.  Psychiatric/Behavioral:  Negative for confusion.   All other systems reviewed and are negative.   Medications No current facility-administered medications on file prior to encounter.   Current Outpatient Medications on File Prior to Encounter  Medication Sig Dispense Refill   carbamazepine (TEGRETOL) 200 MG tablet carbamazepine 200 mg tablet     gabapentin (NEURONTIN) 800 MG tablet Take 1.5 tablets (1,200 mg total) by mouth 2 (two) times daily. 270 tablet 1   omeprazole (PRILOSEC) 20 MG capsule Take 1 capsule by mouth once daily 90 capsule 3   anastrozole (ARIMIDEX) 1 MG tablet Take 1 tablet (1 mg total) by mouth daily. 90 tablet 0   aspirin EC 81 MG tablet Take 81 mg by mouth daily. (Patient not taking: Reported on 02/06/2021)     Biotin 1 MG CAPS Take 1 capsule by mouth daily.  (Patient not taking: No sig reported)     calcium-vitamin D (OSCAL WITH D) 500-200 MG-UNIT TABS tablet Take 1 tablet by mouth daily.  (Patient not taking: Reported on 02/06/2021)     Zinc Sulfate (ZINC 15 PO) Take by mouth. (Patient not taking: Reported on 02/06/2021)      Pertinent medications related to GI and procedure were reviewed by me with the patient prior to the procedure   Current Facility-Administered Medications:    0.9 %  sodium chloride infusion, , Intravenous, Continuous, Annamaria Helling, DO, Last Rate: 20 mL/hr at 04/19/21 0806, New Bag at 04/19/21 0806  sodium chloride 20 mL/hr at 04/19/21 2671       Allergies  Allergen  Reactions   Amoxicillin Hives   Hydrocodone Itching   Sulfamethoxazole-Trimethoprim Nausea Only   Allopurinol Rash   Allergies were reviewed by me prior to the procedure  Objective    Vitals:   04/19/21 0751  BP: 117/66  Pulse: 78  Resp: 16  Temp: (!) 96 F (35.6 C)  TempSrc: Temporal  SpO2: 100%  Weight: 78.9 kg  Height: 5\' 5"  (1.651 m)     Physical Exam Vitals and nursing note reviewed.  Constitutional:      General: She is not in acute  distress.    Appearance: Normal appearance. She is not ill-appearing, toxic-appearing or diaphoretic.  HENT:     Head: Normocephalic and atraumatic.     Nose: Nose normal.     Mouth/Throat:     Mouth: Mucous membranes are moist.     Pharynx: Oropharynx is clear.  Eyes:     General: No scleral icterus.    Extraocular Movements: Extraocular movements intact.  Cardiovascular:     Rate and Rhythm: Normal rate and regular rhythm.     Heart sounds: Normal heart sounds. No murmur heard.   No friction rub. No gallop.  Pulmonary:     Effort: Pulmonary effort is normal. No respiratory distress.     Breath sounds: Normal breath sounds. No wheezing, rhonchi or rales.  Abdominal:     General: Bowel sounds are normal. There is no distension.     Palpations: Abdomen is soft.     Tenderness: There is no abdominal tenderness. There is no guarding or rebound.  Musculoskeletal:     Cervical back: Neck supple.     Right lower leg: No edema.     Left lower leg: No edema.  Skin:    General: Skin is warm and dry.     Coloration: Skin is not jaundiced or pale.  Neurological:     General: No focal deficit present.     Mental Status: She is alert and oriented to person, place, and time. Mental status is at baseline.  Psychiatric:        Mood and Affect: Mood normal.        Behavior: Behavior normal.        Thought Content: Thought content normal.        Judgment: Judgment normal.     Assessment:  Ms. Joanna Phillips is a 70 y.o. female  who presents today for Colonoscopy for  screening colonoscopy (10 year).  Plan:  Colonoscopy with possible intervention today  Colonoscopy with possible biopsy, control of bleeding, polypectomy, and interventions as necessary has been discussed with the patient/patient representative. Informed consent was obtained from the patient/patient representative after explaining the indication, nature, and risks of the procedure including but not limited to death, bleeding,  perforation, missed neoplasm/lesions, cardiorespiratory compromise, and reaction to medications. Opportunity for questions was given and appropriate answers were provided. Patient/patient representative has verbalized understanding is amenable to undergoing the procedure.   Annamaria Helling, DO  [facility]  Portions of the record may have been created with voice recognition software. Occasional wrong-word or 'sound-a-like' substitutions may have occurred due to the inherent limitations of voice recognition software.  Read the chart carefully and recognize, using context, where substitutions may have occurred.

## 2021-04-19 NOTE — Anesthesia Postprocedure Evaluation (Signed)
Anesthesia Post Note  Patient: Joanna Phillips  Procedure(s) Performed: COLONOSCOPY WITH PROPOFOL  Patient location during evaluation: PACU Anesthesia Type: General Level of consciousness: awake and awake and alert Pain management: pain level not controlled Vital Signs Assessment: post-procedure vital signs reviewed and stable Respiratory status: spontaneous breathing and nonlabored ventilation Cardiovascular status: blood pressure returned to baseline Anesthetic complications: no   No notable events documented.   Last Vitals:  Vitals:   04/19/21 0751 04/19/21 0920  BP: 117/66 (!) 88/48  Pulse: 78 84  Resp: 16 17  Temp: (!) 35.6 C   SpO2: 100% 100%    Last Pain:  Vitals:   04/19/21 0751  TempSrc: Temporal                 VAN STAVEREN,Kaija Kovacevic

## 2021-04-22 ENCOUNTER — Encounter: Payer: Self-pay | Admitting: Gastroenterology

## 2021-04-22 LAB — SURGICAL PATHOLOGY

## 2021-07-31 ENCOUNTER — Other Ambulatory Visit: Payer: Self-pay | Admitting: *Deleted

## 2021-07-31 DIAGNOSIS — C9110 Chronic lymphocytic leukemia of B-cell type not having achieved remission: Secondary | ICD-10-CM

## 2021-08-02 ENCOUNTER — Telehealth: Payer: Self-pay

## 2021-08-02 NOTE — Telephone Encounter (Signed)
Reached out to pt in regards to her "Anastrozole" and should she continue taking it. Per Dr.Rao's note on 02/06/2021 pt would complete the 5 years of anastrozole. Confirmed with pt that she has completed her five years and does not need to continue taking the medication. Pt understands and agrees.  ?

## 2021-08-07 ENCOUNTER — Other Ambulatory Visit: Payer: Medicare Other

## 2021-08-07 ENCOUNTER — Ambulatory Visit: Payer: Medicare Other | Admitting: Oncology

## 2021-09-02 ENCOUNTER — Inpatient Hospital Stay: Payer: Medicare Other | Attending: Oncology

## 2021-09-02 ENCOUNTER — Inpatient Hospital Stay (HOSPITAL_BASED_OUTPATIENT_CLINIC_OR_DEPARTMENT_OTHER): Payer: Medicare Other | Admitting: Oncology

## 2021-09-02 ENCOUNTER — Encounter: Payer: Self-pay | Admitting: Oncology

## 2021-09-02 VITALS — BP 102/57 | HR 74 | Temp 96.7°F | Resp 18 | Wt 178.1 lb

## 2021-09-02 DIAGNOSIS — Z79899 Other long term (current) drug therapy: Secondary | ICD-10-CM | POA: Insufficient documentation

## 2021-09-02 DIAGNOSIS — Z833 Family history of diabetes mellitus: Secondary | ICD-10-CM | POA: Diagnosis not present

## 2021-09-02 DIAGNOSIS — E038 Other specified hypothyroidism: Secondary | ICD-10-CM

## 2021-09-02 DIAGNOSIS — D0511 Intraductal carcinoma in situ of right breast: Secondary | ICD-10-CM | POA: Diagnosis not present

## 2021-09-02 DIAGNOSIS — Z923 Personal history of irradiation: Secondary | ICD-10-CM | POA: Diagnosis not present

## 2021-09-02 DIAGNOSIS — C9110 Chronic lymphocytic leukemia of B-cell type not having achieved remission: Secondary | ICD-10-CM

## 2021-09-02 DIAGNOSIS — Z801 Family history of malignant neoplasm of trachea, bronchus and lung: Secondary | ICD-10-CM | POA: Insufficient documentation

## 2021-09-02 DIAGNOSIS — Z881 Allergy status to other antibiotic agents status: Secondary | ICD-10-CM | POA: Diagnosis not present

## 2021-09-02 DIAGNOSIS — Z9049 Acquired absence of other specified parts of digestive tract: Secondary | ICD-10-CM | POA: Insufficient documentation

## 2021-09-02 DIAGNOSIS — Z86000 Personal history of in-situ neoplasm of breast: Secondary | ICD-10-CM

## 2021-09-02 DIAGNOSIS — Z885 Allergy status to narcotic agent status: Secondary | ICD-10-CM | POA: Insufficient documentation

## 2021-09-02 DIAGNOSIS — Z1231 Encounter for screening mammogram for malignant neoplasm of breast: Secondary | ICD-10-CM | POA: Diagnosis not present

## 2021-09-02 DIAGNOSIS — Z803 Family history of malignant neoplasm of breast: Secondary | ICD-10-CM | POA: Insufficient documentation

## 2021-09-02 DIAGNOSIS — R6889 Other general symptoms and signs: Secondary | ICD-10-CM

## 2021-09-02 DIAGNOSIS — Z880 Allergy status to penicillin: Secondary | ICD-10-CM | POA: Insufficient documentation

## 2021-09-02 LAB — CBC WITH DIFFERENTIAL/PLATELET
Abs Immature Granulocytes: 0.01 10*3/uL (ref 0.00–0.07)
Basophils Absolute: 0 10*3/uL (ref 0.0–0.1)
Basophils Relative: 1 %
Eosinophils Absolute: 0.1 10*3/uL (ref 0.0–0.5)
Eosinophils Relative: 1 %
HCT: 37 % (ref 36.0–46.0)
Hemoglobin: 11.7 g/dL — ABNORMAL LOW (ref 12.0–15.0)
Immature Granulocytes: 0 %
Lymphocytes Relative: 68 %
Lymphs Abs: 5.2 10*3/uL — ABNORMAL HIGH (ref 0.7–4.0)
MCH: 25.7 pg — ABNORMAL LOW (ref 26.0–34.0)
MCHC: 31.6 g/dL (ref 30.0–36.0)
MCV: 81.1 fL (ref 80.0–100.0)
Monocytes Absolute: 0.7 10*3/uL (ref 0.1–1.0)
Monocytes Relative: 10 %
Neutro Abs: 1.5 10*3/uL — ABNORMAL LOW (ref 1.7–7.7)
Neutrophils Relative %: 20 %
Platelets: 174 10*3/uL (ref 150–400)
RBC: 4.56 MIL/uL (ref 3.87–5.11)
RDW: 15.7 % — ABNORMAL HIGH (ref 11.5–15.5)
Smear Review: NORMAL
WBC: 7.6 10*3/uL (ref 4.0–10.5)
nRBC: 0 % (ref 0.0–0.2)

## 2021-09-02 LAB — IRON AND TIBC
Iron: 54 ug/dL (ref 28–170)
Saturation Ratios: 13 % (ref 10.4–31.8)
TIBC: 420 ug/dL (ref 250–450)
UIBC: 366 ug/dL

## 2021-09-02 LAB — FERRITIN: Ferritin: 92 ng/mL (ref 11–307)

## 2021-09-02 LAB — FOLATE: Folate: 13.2 ng/mL (ref >5.9)

## 2021-09-02 LAB — VITAMIN B12: Vitamin B-12: 499 pg/mL (ref 180–914)

## 2021-09-02 NOTE — Progress Notes (Signed)
? ? ? ?Hematology/Oncology Consult note ?[place]  ?[phone] [phone] ? ?Patient Care Team: ?Kirk Ruths, MD as PCP - General (Internal Medicine) ?Christene Lye, MD (General Surgery) ?Schermerhorn, Gwen Her, MD as Referring Physician (Obstetrics and Gynecology) ?Johnna Acosta, MD as Referring Physician (General Surgery)  ? ?Name of the patient: Joanna Phillips  ?[id number]  ?11-13-1950  ? ?Date of visit: 09/02/21 ? ?Diagnosis- history of right breast DCIS on Arimidex ?Stage 0 CLL currently under observation ? ?Chief complaint/ Reason for visit-routine follow-up of CLL and DCIS ? ?Heme/Onc history: Patient is a 71 year old female diagnosed with right breast DCIS ER positive in October 2017.Pathology showed 3.9 cm grade 3 DCISOn lumpectomy.  She completed adjuvant radiation treatment and started Arimidex In February 2018.  She completed 5 years of treatment in February 2023 ?  ?Patient also has a history of CLL diagnosed by flow cytometry in 2011 when she presented with lymphocytosis.Flow cytometry on 04/18/2010 noted atypical small cell lymphoma, could not rule out mantle cell lymphoma.  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  SPEP was normal.  Chest, abdomen, and pelvic CT scan revealed small diffuse nodes.  She has been under observation for her CLL and has not required any treatment so far. ? ?Interval history-patient continues to have problems with her trigeminal neuralgia for which she follows up with neurology.  She has completed 5 years of endocrine therapy ? ?ECOG PS- 1 ?Pain scale- 0 ? ? ?Review of systems- Review of Systems  ?Constitutional:  Negative for chills, fever, malaise/fatigue and weight loss.  ?HENT:  Negative for congestion, ear discharge and nosebleeds.   ?Eyes:  Negative for blurred vision.  ?Respiratory:  Negative for cough, hemoptysis, sputum production, shortness of breath and  wheezing.   ?Cardiovascular:  Negative for chest pain, palpitations, orthopnea and claudication.  ?Gastrointestinal:  Negative for abdominal pain, blood in stool, constipation, diarrhea, heartburn, melena, nausea and vomiting.  ?Genitourinary:  Negative for dysuria, flank pain, frequency, hematuria and urgency.  ?Musculoskeletal:  Negative for back pain, joint pain and myalgias.  ?Skin:  Negative for rash.  ?Neurological:  Negative for dizziness, tingling, focal weakness, seizures, weakness and headaches.  ?Endo/Heme/Allergies:  Does not bruise/bleed easily.  ?Psychiatric/Behavioral:  Negative for depression and suicidal ideas. The patient does not have insomnia.    ? ? ? ?Allergies  ?Allergen Reactions  ? Amoxicillin Hives  ? Hydrocodone Itching  ? Sulfamethoxazole-Trimethoprim Nausea Only  ? Allopurinol Rash  ? ? ? ?Past Medical History:  ?Diagnosis Date  ? Arthritis   ? Breast cancer ([place]) 2017  ? DCIS  ? CLL (chronic lymphocytic leukemia) ([place]) 2011  ? DCIS (ductal carcinoma in situ) 03/11/2016  ? GERD (gastroesophageal reflux disease)   ? Personal history of radiation therapy 2017  ? ? ? ?Past Surgical History:  ?Procedure Laterality Date  ? BREAST BIOPSY Right 02/12/2016  ? DCIS  ? BREAST BIOPSY Right 12/27/2019  ? path pending, calcs, x marker  ? BREAST LUMPECTOMY Right 03/11/2016  ? DCIS neg margins  ? CHOLECYSTECTOMY  1996  ? COLONOSCOPY  2013  ? COLONOSCOPY WITH PROPOFOL N/A 04/19/2021  ? Procedure: COLONOSCOPY WITH PROPOFOL;  Surgeon: Annamaria Helling, DO;  Location: [facility] ENDOSCOPY;  Service: Gastroenterology;  Laterality: N/A;  ? squamous cell skin on scalp surgery Right   ? ? ?Social History  ? ?Socioeconomic History  ? Marital status: Married  ?  Spouse name: Not on  file  ? Number of children: Not on file  ? Years of education: Not on file  ? Highest education level: Not on file  ?Occupational History  ? Occupation: retired   ?Tobacco Use  ? Smoking status: Never  ? Smokeless tobacco: Never   ?Vaping Use  ? Vaping Use: Never used  ?Substance and Sexual Activity  ? Alcohol use: No  ? Drug use: No  ? Sexual activity: Not on file  ?Other Topics Concern  ? Not on file  ?Social History Narrative  ? Not on file  ? ?Social Determinants of Health  ? ?Financial Resource Strain: Not on file  ?Food Insecurity: Not on file  ?Transportation Needs: Not on file  ?Physical Activity: Not on file  ?Stress: Not on file  ?Social Connections: Not on file  ?Intimate Partner Violence: Not on file  ? ? ?Family History  ?Problem Relation Age of Onset  ? Cancer Father   ?     lung  ? Diabetes Mother   ? Breast cancer Maternal Aunt 60  ? Breast cancer Maternal Aunt 61  ? ? ? ?Current Outpatient Medications:  ?  aspirin EC 81 MG tablet, Take 81 mg by mouth daily., Disp: , Rfl:  ?  calcium-vitamin D (OSCAL WITH D) 500-200 MG-UNIT TABS tablet, Take 1 tablet by mouth daily., Disp: , Rfl:  ?  fluticasone (FLONASE) 50 MCG/ACT nasal spray, Place into the nose., Disp: , Rfl:  ?  gabapentin (NEURONTIN) 800 MG tablet, Take 1.5 tablets (1,200 mg total) by mouth 2 (two) times daily., Disp: 270 tablet, Rfl: 1 ?  omeprazole (PRILOSEC) 20 MG capsule, Take 1 capsule by mouth once daily, Disp: 90 capsule, Rfl: 3 ?  omeprazole (PRILOSEC) 20 MG capsule, Take by mouth., Disp: , Rfl:  ?  OXcarbazepine (TRILEPTAL) 150 MG tablet, Take 150 mg by mouth 2 (two) times daily., Disp: , Rfl:  ?  vitamin B-12 (CYANOCOBALAMIN) 250 MCG tablet, Take 250 mcg by mouth daily., Disp: , Rfl:  ?  acetaminophen (TYLENOL) 650 MG CR tablet, Take by mouth. (Patient not taking: Reported on 09/02/2021), Disp: , Rfl:  ?  anastrozole (ARIMIDEX) 1 MG tablet, Take 1 tablet (1 mg total) by mouth daily. (Patient not taking: Reported on 09/02/2021), Disp: 90 tablet, Rfl: 0 ?  Biotin 1 MG CAPS, Take 1 capsule by mouth daily.  (Patient not taking: Reported on 08/06/2020), Disp: , Rfl:  ?  carbamazepine (TEGRETOL) 200 MG tablet, carbamazepine 200 mg tablet (Patient not taking: Reported  on 09/02/2021), Disp: , Rfl:  ?  DULoxetine (CYMBALTA) 20 MG capsule, Take by mouth. (Patient not taking: Reported on 09/02/2021), Disp: , Rfl:  ?  LAGEVRIO 200 MG CAPS capsule, Take 4 capsules by mouth 2 (two) times daily. (Patient not taking: Reported on 09/02/2021), Disp: , Rfl:  ?  polyethylene glycol-electrolytes (NULYTELY) 420 g solution, Take by mouth. (Patient not taking: Reported on 09/02/2021), Disp: , Rfl:  ?  traMADol (ULTRAM) 50 MG tablet, SMARTSIG:1 Tablet(s) By Mouth Every 12 Hours PRN (Patient not taking: Reported on 02/06/2021), Disp: , Rfl:  ?  valACYclovir (VALTREX) 1000 MG tablet, Take 1,000 mg by mouth 3 (three) times daily. (Patient not taking: Reported on 02/06/2021), Disp: , Rfl:  ?  Zinc Sulfate (ZINC 15 PO), Take by mouth. (Patient not taking: Reported on 02/06/2021), Disp: , Rfl:  ? ?Physical exam:  ?Vitals:  ? 09/02/21 1003  ?BP: (!) 102/57  ?Pulse: 74  ?Resp: 18  ?Temp: (!) 96.7 ?F (  35.9 ?C)  ?SpO2: 99%  ?Weight: 178 lb 1.6 oz (80.8 kg)  ? ?Physical Exam ?Constitutional:   ?   General: She is not in acute distress. ?Cardiovascular:  ?   Rate and Rhythm: Normal rate and regular rhythm.  ?   Heart sounds: Normal heart sounds.  ?Pulmonary:  ?   Effort: Pulmonary effort is normal.  ?   Breath sounds: Normal breath sounds.  ?Skin: ?   General: Skin is warm and dry.  ?Neurological:  ?   Mental Status: She is alert and oriented to person, place, and time.  ? Breast exam was performed in seated and lying down position. ?Patient is status post right lumpectomy with a well-healed surgical scar. No evidence of any palpable masses. No evidence of axillary adenopathy. No evidence of any palpable masses or lumps in the left breast. No evidence of leftt axillary adenopathy ? ? ? ?  Latest Ref Rng & Units 02/06/2021  ?  9:34 AM  ?CMP  ?Glucose 70 - 99 mg/dL 129    ?BUN 8 - 23 mg/dL 22    ?Creatinine 0.44 - 1.00 mg/dL 0.78    ?Sodium 135 - 145 mmol/L 134    ?Potassium 3.5 - 5.1 mmol/L 3.7    ?Chloride 98 - 111  mmol/L 103    ?CO2 22 - 32 mmol/L 24    ?Calcium 8.9 - 10.3 mg/dL 8.7    ?Total Protein 6.5 - 8.1 g/dL 6.4    ?Total Bilirubin 0.3 - 1.2 mg/dL 0.9    ?Alkaline Phos 38 - 126 U/L 103    ?AST 15 - 41 U/L 51    ?ALT 0

## 2021-09-02 NOTE — Progress Notes (Signed)
Pt states that her head does not stop hurting from shingles; goes to [place] tomorrow to talk about a procedure with Radiology. Had a MRI brain last week on Tuesday. ?

## 2021-10-09 DIAGNOSIS — E538 Deficiency of other specified B group vitamins: Secondary | ICD-10-CM | POA: Insufficient documentation

## 2021-12-25 ENCOUNTER — Ambulatory Visit
Admission: RE | Admit: 2021-12-25 | Discharge: 2021-12-25 | Disposition: A | Discharge status: Home / Self Care | Payer: Medicare Other | Source: Ambulatory Visit | Attending: Oncology | Admitting: Oncology

## 2021-12-25 DIAGNOSIS — Z1231 Encounter for screening mammogram for malignant neoplasm of breast: Secondary | ICD-10-CM | POA: Diagnosis present

## 2022-02-21 ENCOUNTER — Emergency Department: Payer: Medicare Other

## 2022-02-21 ENCOUNTER — Other Ambulatory Visit: Payer: Self-pay

## 2022-02-21 ENCOUNTER — Observation Stay (HOSPITAL_BASED_OUTPATIENT_CLINIC_OR_DEPARTMENT_OTHER)
Admission: EM | Admit: 2022-02-21 | Discharge: 2022-02-22 | Disposition: A | Discharge status: Home / Self Care | Payer: Medicare Other | Source: Home / Self Care | Attending: Emergency Medicine | Admitting: Emergency Medicine

## 2022-02-21 ENCOUNTER — Observation Stay: Payer: Medicare Other

## 2022-02-21 DIAGNOSIS — R7303 Prediabetes: Secondary | ICD-10-CM | POA: Diagnosis present

## 2022-02-21 DIAGNOSIS — E871 Hypo-osmolality and hyponatremia: Secondary | ICD-10-CM

## 2022-02-21 DIAGNOSIS — Z8673 Personal history of transient ischemic attack (TIA), and cerebral infarction without residual deficits: Secondary | ICD-10-CM | POA: Insufficient documentation

## 2022-02-21 DIAGNOSIS — K529 Noninfective gastroenteritis and colitis, unspecified: Secondary | ICD-10-CM

## 2022-02-21 DIAGNOSIS — Z86000 Personal history of in-situ neoplasm of breast: Secondary | ICD-10-CM | POA: Insufficient documentation

## 2022-02-21 DIAGNOSIS — Z7982 Long term (current) use of aspirin: Secondary | ICD-10-CM | POA: Insufficient documentation

## 2022-02-21 DIAGNOSIS — R531 Weakness: Secondary | ICD-10-CM | POA: Diagnosis not present

## 2022-02-21 DIAGNOSIS — K219 Gastro-esophageal reflux disease without esophagitis: Secondary | ICD-10-CM | POA: Diagnosis present

## 2022-02-21 DIAGNOSIS — R197 Diarrhea, unspecified: Secondary | ICD-10-CM

## 2022-02-21 DIAGNOSIS — Z20822 Contact with and (suspected) exposure to covid-19: Secondary | ICD-10-CM | POA: Insufficient documentation

## 2022-02-21 DIAGNOSIS — C9110 Chronic lymphocytic leukemia of B-cell type not having achieved remission: Secondary | ICD-10-CM | POA: Insufficient documentation

## 2022-02-21 DIAGNOSIS — B0229 Other postherpetic nervous system involvement: Secondary | ICD-10-CM | POA: Diagnosis present

## 2022-02-21 DIAGNOSIS — A4151 Sepsis due to Escherichia coli [E. coli]: Secondary | ICD-10-CM | POA: Diagnosis not present

## 2022-02-21 DIAGNOSIS — D0511 Intraductal carcinoma in situ of right breast: Secondary | ICD-10-CM | POA: Diagnosis present

## 2022-02-21 DIAGNOSIS — Z79899 Other long term (current) drug therapy: Secondary | ICD-10-CM | POA: Insufficient documentation

## 2022-02-21 LAB — RESP PANEL BY RT-PCR (FLU A&B, COVID) ARPGX2
Influenza A by PCR: NEGATIVE
Influenza B by PCR: NEGATIVE
SARS Coronavirus 2 by RT PCR: NEGATIVE

## 2022-02-21 LAB — URINALYSIS, ROUTINE W REFLEX MICROSCOPIC
Bilirubin Urine: NEGATIVE
Glucose, UA: NEGATIVE mg/dL
Hgb urine dipstick: NEGATIVE
Ketones, ur: NEGATIVE mg/dL
Leukocytes,Ua: NEGATIVE
Nitrite: NEGATIVE
Protein, ur: NEGATIVE mg/dL
Specific Gravity, Urine: 1.009 (ref 1.005–1.030)
pH: 6 (ref 5.0–8.0)

## 2022-02-21 LAB — COMPREHENSIVE METABOLIC PANEL
ALT: 19 U/L (ref 0–44)
AST: 26 U/L (ref 15–41)
Albumin: 3.9 g/dL (ref 3.5–5.0)
Alkaline Phosphatase: 98 U/L (ref 38–126)
Anion gap: 9 (ref 5–15)
BUN: 15 mg/dL (ref 8–23)
CO2: 25 mmol/L (ref 22–32)
Calcium: 8.4 mg/dL — ABNORMAL LOW (ref 8.9–10.3)
Chloride: 89 mmol/L — ABNORMAL LOW (ref 98–111)
Creatinine, Ser: 0.6 mg/dL (ref 0.44–1.00)
GFR, Estimated: >60 mL/min (ref >60)
Glucose, Bld: 156 mg/dL — ABNORMAL HIGH (ref 70–99)
Potassium: 4.1 mmol/L (ref 3.5–5.1)
Sodium: 123 mmol/L — ABNORMAL LOW (ref 135–145)
Total Bilirubin: 0.7 mg/dL (ref 0.3–1.2)
Total Protein: 6.6 g/dL (ref 6.5–8.1)

## 2022-02-21 LAB — GLUCOSE, CAPILLARY: Glucose-Capillary: 116 mg/dL — ABNORMAL HIGH (ref 70–99)

## 2022-02-21 LAB — TROPONIN I (HIGH SENSITIVITY): Troponin I (High Sensitivity): 5 ng/L (ref <18)

## 2022-02-21 LAB — CBC
HCT: 34.9 % — ABNORMAL LOW (ref 36.0–46.0)
Hemoglobin: 11.6 g/dL — ABNORMAL LOW (ref 12.0–15.0)
MCH: 25.7 pg — ABNORMAL LOW (ref 26.0–34.0)
MCHC: 33.2 g/dL (ref 30.0–36.0)
MCV: 77.4 fL — ABNORMAL LOW (ref 80.0–100.0)
Platelets: 228 10*3/uL (ref 150–400)
RBC: 4.51 MIL/uL (ref 3.87–5.11)
RDW: 15 % (ref 11.5–15.5)
WBC: 9 10*3/uL (ref 4.0–10.5)
nRBC: 0 % (ref 0.0–0.2)

## 2022-02-21 LAB — LIPASE, BLOOD: Lipase: 38 U/L (ref 11–51)

## 2022-02-21 MED ORDER — MORPHINE SULFATE (PF) 2 MG/ML IV SOLN: 1.0000 mg | INTRAVENOUS | Status: DC | PRN

## 2022-02-21 MED ORDER — POLYVINYL ALCOHOL 1.4 % OP SOLN
1.0000 [drp] | OPHTHALMIC | Status: DC | PRN
  Administered 2022-02-21: 1 [drp] via OPHTHALMIC
  Filled 2022-02-21: qty 15

## 2022-02-21 MED ORDER — ASPIRIN 81 MG PO TBEC
81.0000 mg | DELAYED_RELEASE_TABLET | Freq: Every day | ORAL | Status: DC
  Administered 2022-02-22: 81 mg via ORAL
  Filled 2022-02-21: qty 1

## 2022-02-21 MED ORDER — ONDANSETRON HCL 4 MG/2ML IJ SOLN
4.0000 mg | Freq: Once | INTRAMUSCULAR | Status: DC
  Filled 2022-02-21: qty 2

## 2022-02-21 MED ORDER — OYSTER SHELL CALCIUM/D3 500-5 MG-MCG PO TABS
1.0000 | ORAL_TABLET | Freq: Every day | ORAL | Status: DC
  Administered 2022-02-22: 1 via ORAL
  Filled 2022-02-21: qty 1

## 2022-02-21 MED ORDER — OXCARBAZEPINE 150 MG PO TABS: 150.0000 mg | ORAL_TABLET | Freq: Two times a day (BID) | ORAL | Status: DC

## 2022-02-21 MED ORDER — IOHEXOL 300 MG/ML  SOLN
100.0000 mL | Freq: Once | INTRAMUSCULAR | Status: AC | PRN
  Administered 2022-02-21: 100 mL via INTRAVENOUS

## 2022-02-21 MED ORDER — INSULIN ASPART 100 UNIT/ML IJ SOLN: 0.0000 [IU] | Freq: Every day | INTRAMUSCULAR | Status: DC

## 2022-02-21 MED ORDER — ACETAMINOPHEN 650 MG RE SUPP: 650.0000 mg | Freq: Four times a day (QID) | RECTAL | Status: DC | PRN

## 2022-02-21 MED ORDER — OXCARBAZEPINE 300 MG PO TABS
300.0000 mg | ORAL_TABLET | Freq: Two times a day (BID) | ORAL | Status: DC
  Administered 2022-02-21 – 2022-02-22 (×2): 300 mg via ORAL
  Filled 2022-02-21 (×2): qty 1

## 2022-02-21 MED ORDER — ACETAMINOPHEN 325 MG PO TABS: 650.0000 mg | ORAL_TABLET | Freq: Four times a day (QID) | ORAL | Status: DC | PRN

## 2022-02-21 MED ORDER — GUAIFENESIN ER 600 MG PO TB12
600.0000 mg | ORAL_TABLET | Freq: Two times a day (BID) | ORAL | Status: DC | PRN
  Administered 2022-02-21: 600 mg via ORAL
  Filled 2022-02-21 (×2): qty 1

## 2022-02-21 MED ORDER — SODIUM CHLORIDE 0.9 % IV BOLUS
500.0000 mL | Freq: Once | INTRAVENOUS | Status: AC
  Administered 2022-02-21: 500 mL via INTRAVENOUS

## 2022-02-21 MED ORDER — GABAPENTIN 400 MG PO CAPS
800.0000 mg | ORAL_CAPSULE | Freq: Three times a day (TID) | ORAL | Status: DC
  Administered 2022-02-21 – 2022-02-22 (×3): 800 mg via ORAL
  Filled 2022-02-21 (×4): qty 2

## 2022-02-21 MED ORDER — GABAPENTIN 600 MG PO TABS: 1200.0000 mg | ORAL_TABLET | Freq: Two times a day (BID) | ORAL | Status: DC

## 2022-02-21 MED ORDER — ENOXAPARIN SODIUM 40 MG/0.4ML IJ SOSY
40.0000 mg | PREFILLED_SYRINGE | INTRAMUSCULAR | Status: DC
  Administered 2022-02-21: 40 mg via SUBCUTANEOUS
  Filled 2022-02-21: qty 0.4

## 2022-02-21 MED ORDER — INSULIN ASPART 100 UNIT/ML IJ SOLN: 0.0000 [IU] | Freq: Three times a day (TID) | INTRAMUSCULAR | Status: DC

## 2022-02-21 MED ORDER — ONDANSETRON HCL 4 MG/2ML IJ SOLN: 4.0000 mg | Freq: Four times a day (QID) | INTRAMUSCULAR | Status: DC | PRN

## 2022-02-21 MED ORDER — CYANOCOBALAMIN 500 MCG PO TABS
250.0000 ug | ORAL_TABLET | Freq: Every day | ORAL | Status: DC
  Administered 2022-02-22: 250 ug via ORAL
  Filled 2022-02-21: qty 1

## 2022-02-21 MED ORDER — ONDANSETRON HCL 4 MG PO TABS: 4.0000 mg | ORAL_TABLET | Freq: Four times a day (QID) | ORAL | Status: DC | PRN

## 2022-02-21 MED ORDER — PANTOPRAZOLE SODIUM 40 MG PO TBEC
40.0000 mg | DELAYED_RELEASE_TABLET | Freq: Every day | ORAL | Status: DC
  Administered 2022-02-22: 40 mg via ORAL
  Filled 2022-02-21: qty 1

## 2022-02-21 MED ORDER — FLUTICASONE PROPIONATE 50 MCG/ACT NA SUSP: 2.0000 | Freq: Every day | NASAL | Status: DC | PRN

## 2022-02-21 NOTE — Assessment & Plan Note (Signed)
-   Gabapentin 800 mg p.o. 3 times daily, patient missed her 2/3 PM dosing due to being in the emergency department - Trileptal 300 mg twice daily resumed

## 2022-02-21 NOTE — Assessment & Plan Note (Signed)
With diarrhea - Supportive measures - Check C. difficile and GI panel - Ondansetron as needed for nausea and vomiting ordered

## 2022-02-21 NOTE — Assessment & Plan Note (Addendum)
-   Insulin SSI with at bedtime coverage

## 2022-02-21 NOTE — ED Provider Notes (Signed)
[facility] Provider Note    Event Date/Time   First MD Initiated Contact with Patient 02/21/22 1417     (approximate)   History   Abnormal Lab   HPI  FLOETTA BRICKEY is a 71 y.o. female who comes in due to low sodium.  Patient reports having some ongoing abdominal pain on the left side as well as headache since Monday.  She reports multiple episodes of diarrhea but denies any recent antibiotics.  She reports the diarrhea has seemed of gotten better but still has some nausea and left lower quadrant pain.  Does report a history of diverticulosis.  She reports feeling a little bit of dizziness when she tries to get up and walk but denies any falls or hitting her head or any dizziness at rest.   Physical Exam   Triage Vital Signs: ED Triage Vitals  Enc Vitals Group     BP 02/21/22 1241 119/61     Pulse Rate 02/21/22 1241 73     Resp 02/21/22 1241 18     Temp 02/21/22 1241 97.7 F (36.5 C)     Temp Source 02/21/22 1241 Oral     SpO2 02/21/22 1241 96 %     Weight 02/21/22 1242 183 lb (83 kg)     Height --      Head Circumference --      Peak Flow --      Pain Score 02/21/22 1242 4     Pain Loc --      Pain Edu? --      Excl. in [place]? --     Most recent vital signs: Vitals:   02/21/22 1241  BP: 119/61  Pulse: 73  Resp: 18  Temp: 97.7 F (36.5 C)  SpO2: 96%     General: Awake, no distress.  CV:  Good peripheral perfusion.  Resp:  Normal effort.  Abd:  No distention.  Tender in the left lower quadrant Other:  Cranial nerves appear intact   ED Results / Procedures / Treatments   Labs (all labs ordered are listed, but only abnormal results are displayed) Labs Reviewed  COMPREHENSIVE METABOLIC PANEL - Abnormal; Notable for the following components:      Result Value   Sodium 123 (*)    Chloride 89 (*)    Glucose, Bld 156 (*)    Calcium 8.4 (*)    All other components within normal limits  CBC - Abnormal; Notable for the following  components:   Hemoglobin 11.6 (*)    HCT 34.9 (*)    MCV 77.4 (*)    MCH 25.7 (*)    All other components within normal limits  URINALYSIS, ROUTINE W REFLEX MICROSCOPIC - Abnormal; Notable for the following components:   Color, Urine YELLOW (*)    APPearance CLEAR (*)    All other components within normal limits  SARS CORONAVIRUS 2 BY RT PCR  LIPASE, BLOOD  TROPONIN I (HIGH SENSITIVITY)     EKG  My interpretation of EKG:  Normal sinus rate of 72 without any ST elevation or T wave inversions, type I AV block  RADIOLOGY [place]   PROCEDURES:  Critical Care performed: No  .1-3 Lead EKG Interpretation  Performed by: Vanessa [place], MD Authorized by: Vanessa Lane, MD     Interpretation: normal     ECG rate:  70   ECG rate assessment: normal     Rhythm: sinus rhythm     Ectopy:  none     Conduction: abnormal     Abnormal conduction: 1st degree AV block      MEDICATIONS ORDERED IN ED: Medications  ondansetron (ZOFRAN) injection 4 mg (has no administration in time range)  sodium chloride 0.9 % bolus 500 mL (has no administration in time range)     IMPRESSION / MDM / ASSESSMENT AND PLAN / ED COURSE  I reviewed the triage vital signs and the nursing notes.   Patient's presentation is most consistent with acute presentation with potential threat to life or bodily function.   Patient comes in with dehydration, low sodium I suspect most likely from diarrheal illness.  Labs ordered to evaluate for UTI.  Her sodium is 123 which is low from her normal.  Her hemoglobin is around baseline.  White count is normal.  Urine without evidence of UTI.  Exam overall is reassuring we will get CT head given headaches to ensure no ICH but I suspect dizziness is more likely related to the low sodium.  She denies any symptoms at rest to suggest posterior stroke.  We will give 500 cc of fluid and some Zofran and discussed the hospital team for admission   The patient is on the cardiac  monitor to evaluate for evidence of arrhythmia and/or significant heart rate changes.      FINAL CLINICAL IMPRESSION(S) / ED DIAGNOSES   Final diagnoses:  Hyponatremia     Rx / DC Orders   ED Discharge Orders     None        Note:  This document was prepared using Dragon voice recognition software and may include unintentional dictation errors.   Vanessa [place], MD 02/21/22 [phone]

## 2022-02-21 NOTE — H&P (Addendum)
History and Physical   PHILICIA HEYNE AOZ:[id number] DOB: [birth year] DOA: 02/21/2022  PCP: Kirk Ruths, MD  Patient coming from: home  I have personally briefly reviewed patient's old medical records in [place].  Chief Concern: Hyponatremia, nausea, vomiting, diarrhea  HPI: Ms. Joanna Phillips is a 71 year old female with history of depression, anxiety, neuropathy, GERD, trigeminal neuralgia of the right side of her face, postherpetic neuralgia, history of stroke, presents emergency department from PCP for chief concerns of abnormal labs. In Her vitals in the emergency department showed temperature of 97.7, respiration rate of 18, heart rate of 79, blood pressure 119/61, SPO2 of 96% on room air.  Serum sodium is 123, potassium 4.1, chloride 89, bicarb 25, BUN of 15, serum creatinine of 0.60, GFR greater than 60, nonfasting blood glucose 156, WBC 9.0, hemoglobin 11.6, platelets of 228.  CT abdomen pelvis with contrast ordered by EDP pending CT head without contrast ordered by EDP pending  ED treatment: Sodium chloride 500 mL bolus.  At bedside, she is able to tell me her name, age, current location, and current year.  She reports having abdominal pain that started on Monday. She denies trauma, changes to her diet. She denies fever, chills.   The cough, started on Tuesday. She reports the infrequent sputum, that is dark green. She denies fever, chills. She endorses nausea and denies vomiting.   She reports diarrhea 2-3x per day. She has had 2x of diarrhea priro to ED presentation.   Social history: She lives with her husband and daughter at home. She denies tobacco, etoh, and recreational drug use. She is retired and formerly worked in Geologist, engineering at [employer]: Constitutional: no weight change, no fever ENT/Mouth: no sore throat, no rhinorrhea Eyes: no eye pain, no vision changes Cardiovascular: no chest pain, no dyspnea,  no edema, no palpitations Respiratory: + cough,  + sputum, no wheezing Gastrointestinal: no nausea, no vomiting, + diarrhea, no constipation Genitourinary: no urinary incontinence, no dysuria, no hematuria Musculoskeletal: no arthralgias, no myalgias Skin: no skin lesions, no pruritus, Neuro: + weakness, no loss of consciousness, no syncope Psych: no anxiety, no depression, + decrease appetite Heme/Lymph: no bruising, no bleeding  ED Course: Discussed with emergency medicine provider, patient requiring hospitalization for chief concerns of mild hyponatremia.  Assessment/Plan  Principal Problem:   Hyponatremia Active Problems:   CLL (chronic lymphocytic leukemia) (HCC)   Post herpetic neuralgia   GERD (gastroesophageal reflux disease)   Ductal carcinoma in situ (DCIS) of right breast   Prediabetes   Gastroenteritis   Assessment and Plan:  * Hyponatremia - Status post sodium chloride 500 mL bolus per EDP - Encourage p.o. intake - Repeat BMP in the a.m.  Gastroenteritis With diarrhea - Supportive measures - Check C. difficile and GI panel - Ondansetron as needed for nausea and vomiting ordered  Prediabetes - Insulin SSI with at bedtime coverage  GERD (gastroesophageal reflux disease) - PPI  Post herpetic neuralgia - Gabapentin 800 mg p.o. 3 times daily, patient missed her 2/3 PM dosing due to being in the emergency department - Trileptal 300 mg twice daily resumed  Chart reviewed.   DVT prophylaxis: Enoxaparin Code Status: Full code Diet: Regular diet Family Communication: Daughter, Joseph Art was updated at bedside with patient's permission Disposition Plan: Pending clinical course, improvement of serum sodium level Consults called: None at this time Admission status: Telemetry medical, observation  Past Medical History:  Diagnosis Date   Arthritis    Breast cancer ([place]) 2017  DCIS   CLL (chronic lymphocytic leukemia) ([place]) 2011   DCIS (ductal carcinoma in situ) 03/11/2016   GERD (gastroesophageal reflux  disease)    Personal history of radiation therapy 2017   Past Surgical History:  Procedure Laterality Date   BREAST BIOPSY Right 02/12/2016   DCIS   BREAST BIOPSY Right 12/27/2019   path pending, calcs, x marker   BREAST LUMPECTOMY Right 03/11/2016   DCIS neg margins   CHOLECYSTECTOMY  1996   COLONOSCOPY  2013   COLONOSCOPY WITH PROPOFOL N/A 04/19/2021   Procedure: COLONOSCOPY WITH PROPOFOL;  Surgeon: Annamaria Helling, DO;  Location: [facility] ENDOSCOPY;  Service: Gastroenterology;  Laterality: N/A;   squamous cell skin on scalp surgery Right    Social History:  reports that she has never smoked. She has never used smokeless tobacco. She reports that she does not drink alcohol and does not use drugs.  Allergies  Allergen Reactions   Amoxicillin Hives   Hydrocodone Itching   Sulfamethoxazole-Trimethoprim Nausea Only   Allopurinol Rash   Family History  Problem Relation Age of Onset   Cancer Father        lung   Diabetes Mother    Breast cancer Maternal Aunt 60   Breast cancer Maternal Aunt 70   Family history: Family history reviewed and not pertinent  Prior to Admission medications   Medication Sig Start Date End Date Taking? Authorizing Provider  aspirin EC 81 MG tablet Take 81 mg by mouth daily.   Yes [provider]  calcium-vitamin D (OSCAL WITH D) 500-200 MG-UNIT TABS tablet Take 1 tablet by mouth daily.   Yes [provider]  gabapentin (NEURONTIN) 800 MG tablet Take 1.5 tablets (1,200 mg total) by mouth 2 (two) times daily. 01/11/19  Yes Mikey College, NP  omeprazole (PRILOSEC) 20 MG capsule Take 1 capsule by mouth once daily 01/11/19  Yes Mikey College, NP  OXcarbazepine (TRILEPTAL) 150 MG tablet Take 150 mg by mouth 2 (two) times daily. 08/21/21  Yes [provider]  vitamin B-12 (CYANOCOBALAMIN) 250 MCG tablet Take 250 mcg by mouth daily.   Yes [provider]  acetaminophen (TYLENOL) 650 MG CR tablet Take by mouth.     [provider]  anastrozole (ARIMIDEX) 1 MG tablet Take 1 tablet (1 mg total) by mouth daily. Patient not taking: Reported on 09/02/2021 01/04/21   Sindy Guadeloupe, MD  Biotin 1 MG CAPS Take 1 capsule by mouth daily.  Patient not taking: Reported on 08/06/2020    [provider]  carbamazepine (TEGRETOL) 200 MG tablet carbamazepine 200 mg tablet Patient not taking: Reported on 09/02/2021 01/05/20   [provider]  DULoxetine (CYMBALTA) 20 MG capsule Take by mouth. Patient not taking: Reported on 09/02/2021 12/17/20 03/17/21  [provider]  fluticasone (FLONASE) 50 MCG/ACT nasal spray Place into the nose. 09/10/20   [provider]  LAGEVRIO 200 MG CAPS capsule Take 4 capsules by mouth 2 (two) times daily. Patient not taking: Reported on 09/02/2021 05/09/21   [provider]  polyethylene glycol-electrolytes (NULYTELY) 420 g solution Take by mouth. Patient not taking: Reported on 09/02/2021 01/08/21   [provider]  traMADol (ULTRAM) 50 MG tablet SMARTSIG:1 Tablet(s) By Mouth Every 12 Hours PRN Patient not taking: Reported on 02/06/2021 12/20/20   [provider]  valACYclovir (VALTREX) 1000 MG tablet Take 1,000 mg by mouth 3 (three) times daily. Patient not taking: Reported on 02/06/2021 11/13/20   [provider]  Zinc Sulfate (ZINC 15 PO) Take by mouth. Patient not taking: Reported on 02/06/2021    [provider]   Physical Exam: Vitals:   02/21/22 1630 02/21/22 1700 02/21/22 1730 02/21/22 1835  BP: (!) 122/57 127/69  118/69  Pulse: 68   75  Resp: 16   16  Temp:  98.1 F (36.7 C)  97.9 F (36.6 C)  TempSrc:  Oral    SpO2: 99%  90% 100%  Weight:       Constitutional: appears age-appropriate, NAD, calm, comfortable Eyes: PERRL, lids and conjunctivae normal ENMT: Mucous membranes are moist. Posterior pharynx clear of any exudate or lesions. Age-appropriate dentition. Hearing appropriate Neck: normal,  supple, no masses, no thyromegaly Respiratory: clear to auscultation bilaterally, no wheezing, no crackles. Normal respiratory effort. No accessory muscle use.  Cardiovascular: Regular rate and rhythm, no murmurs / rubs / gallops. No extremity edema. 2+ pedal pulses. No carotid bruits.  Abdomen: Obese abdomen no tenderness, no masses palpated, no hepatosplenomegaly. Bowel sounds positive.  Musculoskeletal: no clubbing / cyanosis. No joint deformity upper and lower extremities. Good ROM, no contractures, no atrophy. Normal muscle tone.  Skin: no rashes, lesions, ulcers. No induration Neurologic: Sensation intact. Strength 5/5 in all 4.  Psychiatric: Normal judgment and insight. Alert and oriented x 3. Normal mood.   EKG: independently reviewed, showing sinus rhythm with rate of 72, first-degree AV block, QTc 424  Chest x-ray on Admission: I personally reviewed and I agree with radiologist reading as below.  CT ABDOMEN PELVIS W CONTRAST  Result Date: 02/21/2022 CLINICAL DATA:  Abdominal pain EXAM: CT ABDOMEN AND PELVIS WITH CONTRAST TECHNIQUE: Multidetector CT imaging of the abdomen and pelvis was performed using the standard protocol following bolus administration of intravenous contrast. RADIATION DOSE REDUCTION: This exam was performed according to the departmental dose-optimization program which includes automated exposure control, adjustment of the mA and/or kV according to patient size and/or use of iterative reconstruction technique. CONTRAST:  187m OMNIPAQUE IOHEXOL 300 MG/ML  SOLN COMPARISON:  05/27/2010 FINDINGS: Lower chest: Breathing motion limits evaluation of lower lung fields. As far as seen, there is no focal consolidation in the lower lung fields. There is no pleural effusion. Dense calcification is seen in mitral annulus. Scattered coronary artery calcifications are seen. Hepatobiliary: Liver measures 19.9 cm in length. There is fatty infiltration. Surgical clips are seen in  gallbladder fossa. Pancreas: No focal abnormalities are seen. Evaluation of the tail is technically difficult due to lymphadenopathy in splenic hilum. Spleen: Spleen measures 13.9 cm in maximum diameter. Adrenals/Urinary Tract: Adrenals are unremarkable. There is no hydronephrosis. There are no renal or ureteral stones. Urinary bladder is unremarkable. Stomach/Bowel: Small hiatal hernia is seen. Stomach is not distended. Small bowel loops are not dilated. Appendix is not seen. Multiple diverticula are seen in colon without signs of focal diverticulitis. Vascular/Lymphatic: Atherosclerotic plaques and calcifications are seen in aorta and its major branches. There is 2.2 cm ectasia in the infrarenal aorta. No follow-up imaging is recommended. There are enlarged lymph nodes in the retrocrural region, retroperitoneum, along the lesser curvature aspect of the stomach, poorly bodies and along the medial aspect of spleen. Largest of the nodes is noted in the right iliac chain measuring 3.6 cm in short axis measurement. There are other bulky lymph nodes in retroperitoneum. There is interval increase in size and number of lymph nodes in abdomen and pelvis. Reproductive: Calcified uterine fibroid is seen. Other: There is no ascites or pneumoperitoneum. Musculoskeletal: Degenerative changes are noted  in thoracic and lumbar spine, more severe at L2-L3, L3-L4 and L5-S1 levels. There is mild retrolisthesis at L2-L3 level. IMPRESSION: There are abnormally enlarged lymph nodes in retrocrural region, retroperitoneum, mesentery, adjacent to porta hepatis and medial to the spleen. Findings suggest active inflammatory or neoplastic process. PET-CT and tissue sampling as warranted should be considered. Enlarged spleen. Enlarged fatty liver. Small hiatal hernia. Diverticulosis of colon without signs of focal diverticulitis. Lumbar spondylosis. Other findings as described in the body of the report. Electronically Signed   By: Elmer Picker M.D.   On: 02/21/2022 16:16   CT HEAD WO CONTRAST (5MM)  Result Date: 02/21/2022 CLINICAL DATA:  Headache, sudden, severe EXAM: CT HEAD WITHOUT CONTRAST TECHNIQUE: Contiguous axial images were obtained from the base of the skull through the vertex without intravenous contrast. RADIATION DOSE REDUCTION: This exam was performed according to the departmental dose-optimization program which includes automated exposure control, adjustment of the mA and/or kV according to patient size and/or use of iterative reconstruction technique. COMPARISON:  02/21/2016 FINDINGS: Brain: No evidence of acute infarction, hemorrhage, hydrocephalus, extra-axial collection or mass lesion/mass effect. Scattered low-density changes within the periventricular and subcortical white matter compatible with chronic microvascular ischemic change. Mild diffuse cerebral volume loss. Vascular: Atherosclerotic calcifications involving the large vessels of the skull base. No unexpected hyperdense vessel. Skull: Postsurgical changes to the right suboccipital calvarium. Negative for calvarial fracture. Sinuses/Orbits: Mild mucosal thickening within the ethmoid air cells. Otherwise clear. Other: None. IMPRESSION: 1. No acute intracranial findings. 2. Chronic microvascular ischemic change and cerebral volume loss. Electronically Signed   By: Davina Poke D.O.   On: 02/21/2022 15:59   DG Chest 2 View  Result Date: 02/21/2022 CLINICAL DATA:  Hyponatremia.  Evaluate for thoracic mass. EXAM: CHEST - 2 VIEW COMPARISON:  Radiographs 04/26/2012.  CT 05/27/2010. FINDINGS: The heart size and mediastinal contours are normal. The lungs are clear. There is no pleural effusion or pneumothorax. No acute osseous findings are identified. There are mild degenerative changes in the spine. Cholecystectomy clips and vascular calcifications are noted. IMPRESSION: No active cardiopulmonary process. No evidence of thoracic mass. Electronically Signed    By: Richardean Sale M.D.   On: 02/21/2022 15:08    Labs on Admission: I have personally reviewed following labs  CBC: Recent Labs  Lab 02/21/22 1248  WBC 9.0  HGB 11.6*  HCT 34.9*  MCV 77.4*  PLT 829   Basic Metabolic Panel: Recent Labs  Lab 02/21/22 1248  NA 123*  K 4.1  CL 89*  CO2 25  GLUCOSE 156*  BUN 15  CREATININE 0.60  CALCIUM 8.4*   GFR: CrCl cannot be calculated (Unknown ideal weight.).  Liver Function Tests: Recent Labs  Lab 02/21/22 1248  AST 26  ALT 19  ALKPHOS 98  BILITOT 0.7  PROT 6.6  ALBUMIN 3.9   Recent Labs  Lab 02/21/22 1248  LIPASE 38   Urine analysis:    Component Value Date/Time   COLORURINE YELLOW (A) 02/21/2022 1358   APPEARANCEUR CLEAR (A) 02/21/2022 1358   LABSPEC 1.009 02/21/2022 1358   PHURINE 6.0 02/21/2022 1358   GLUCOSEU NEGATIVE 02/21/2022 1358   HGBUR NEGATIVE 02/21/2022 [place] 02/21/2022 1358   BILIRUBINUR neg 03/17/2016 1103   KETONESUR NEGATIVE 02/21/2022 1358   PROTEINUR NEGATIVE 02/21/2022 1358   UROBILINOGEN 0.2 03/17/2016 1103   NITRITE NEGATIVE 02/21/2022 1358   LEUKOCYTESUR NEGATIVE 02/21/2022 1358   Dr. Tobie Poet [facility]  If 7PM-7AM, please contact overnight-coverage  provider If 7AM-7PM, please contact day coverage provider [email or web address]  02/21/2022, 6:56 PM

## 2022-02-21 NOTE — Assessment & Plan Note (Signed)
-   Status post sodium chloride 500 mL bolus per EDP - Encourage p.o. intake - Repeat BMP in the a.m.

## 2022-02-21 NOTE — Hospital Course (Addendum)
Joanna Phillips is a 71 year old female with history of depression, anxiety, neuropathy, GERD, trigeminal neuralgia of the right side of her face, postherpetic neuralgia, history of stroke, presents emergency department from PCP for chief concerns of abnormal labs. In Her vitals in the emergency department showed temperature of 97.7, respiration rate of 18, heart rate of 79, blood pressure 119/61, SPO2 of 96% on room air.  Serum sodium is 123, potassium 4.1, chloride 89, bicarb 25, BUN of 15, serum creatinine of 0.60, GFR greater than 60, nonfasting blood glucose 156, WBC 9.0, hemoglobin 11.6, platelets of 228.  CT abdomen pelvis with contrast ordered by EDP pending CT head without contrast ordered by EDP pending  ED treatment: Sodium chloride 500 mL bolus.

## 2022-02-21 NOTE — ED Triage Notes (Signed)
Pt arrives with c/o abnormal lab values from MD office. Per pt, she was sent here per MD because of her sodium was low. Pt endorses ABD pain and headache that has been going on since Monday. Pt also endorses diarrhea and nausea.

## 2022-02-21 NOTE — Assessment & Plan Note (Signed)
PPI ?

## 2022-02-21 NOTE — Plan of Care (Signed)
  Problem: Health Behavior/Discharge Planning: Goal: Ability to manage health-related needs will improve Outcome: Progressing   

## 2022-02-22 DIAGNOSIS — K219 Gastro-esophageal reflux disease without esophagitis: Secondary | ICD-10-CM | POA: Diagnosis not present

## 2022-02-22 DIAGNOSIS — K529 Noninfective gastroenteritis and colitis, unspecified: Secondary | ICD-10-CM

## 2022-02-22 DIAGNOSIS — E871 Hypo-osmolality and hyponatremia: Secondary | ICD-10-CM | POA: Diagnosis not present

## 2022-02-22 LAB — CBC
HCT: 35.4 % — ABNORMAL LOW (ref 36.0–46.0)
Hemoglobin: 11.6 g/dL — ABNORMAL LOW (ref 12.0–15.0)
MCH: 25.2 pg — ABNORMAL LOW (ref 26.0–34.0)
MCHC: 32.8 g/dL (ref 30.0–36.0)
MCV: 77 fL — ABNORMAL LOW (ref 80.0–100.0)
Platelets: 220 10*3/uL (ref 150–400)
RBC: 4.6 MIL/uL (ref 3.87–5.11)
RDW: 15.2 % (ref 11.5–15.5)
WBC: 6.3 10*3/uL (ref 4.0–10.5)
nRBC: 0 % (ref 0.0–0.2)

## 2022-02-22 LAB — HEMOGLOBIN A1C
Hgb A1c MFr Bld: 5.2 % (ref 4.8–5.6)
Mean Plasma Glucose: 102.54 mg/dL

## 2022-02-22 LAB — BASIC METABOLIC PANEL
Anion gap: 5 (ref 5–15)
BUN: 13 mg/dL (ref 8–23)
CO2: 27 mmol/L (ref 22–32)
Calcium: 8.5 mg/dL — ABNORMAL LOW (ref 8.9–10.3)
Chloride: 97 mmol/L — ABNORMAL LOW (ref 98–111)
Creatinine, Ser: 0.51 mg/dL (ref 0.44–1.00)
GFR, Estimated: >60 mL/min (ref >60)
Glucose, Bld: 108 mg/dL — ABNORMAL HIGH (ref 70–99)
Potassium: 4 mmol/L (ref 3.5–5.1)
Sodium: 129 mmol/L — ABNORMAL LOW (ref 135–145)

## 2022-02-22 LAB — GLUCOSE, CAPILLARY: Glucose-Capillary: 103 mg/dL — ABNORMAL HIGH (ref 70–99)

## 2022-02-22 LAB — MAGNESIUM: Magnesium: 2.1 mg/dL (ref 1.7–2.4)

## 2022-02-22 MED ORDER — GUAIFENESIN ER 600 MG PO TB12
600.0000 mg | ORAL_TABLET | Freq: Two times a day (BID) | ORAL | Status: DC
  Administered 2022-02-22: 600 mg via ORAL

## 2022-02-22 MED ORDER — SODIUM CHLORIDE 0.9 % IV SOLN: INTRAVENOUS | Status: DC

## 2022-02-22 MED ORDER — GUAIFENESIN ER 600 MG PO TB12: 600.0000 mg | ORAL_TABLET | Freq: Two times a day (BID) | ORAL | 0 refills | Status: AC

## 2022-02-22 NOTE — Discharge Summary (Signed)
Physician Discharge Summary   Patient: Joanna Phillips MRN: [id number] DOB: [birth year]  Admit date:     02/21/2022  Discharge date: 02/22/22  Discharge Physician: Sharen Hones   PCP: Kirk Ruths, MD   Recommendations at discharge:   Follow-up with PCP in 1 week. Check a BMP at the next office visit.  Discharge Diagnoses: Principal Problem:   Hyponatremia Active Problems:   CLL (chronic lymphocytic leukemia) (HCC)   Post herpetic neuralgia   GERD (gastroesophageal reflux disease)   Ductal carcinoma in situ (DCIS) of right breast   Prediabetes   Gastroenteritis  Resolved Problems:   * No resolved hospital problems. [facility]: Ms. Joanna Phillips is a 71 year old female with history of depression, anxiety, neuropathy, GERD, trigeminal neuralgia of the right side of her face, postherpetic neuralgia, history of stroke, presents emergency department from PCP for chief concerns of abnormal labs.  She also had a significant nausea and diarrhea for the last 2 or 3 days.  Upon arriving the hospital, her sodium was 123, she was given 500 mL normal saline bolus.  Diarrhea since has resolved, sodium level went up to 129 today.  Hyponatremia appears to be secondary to dehydration, it quickly corrected itself after diarrhea stopped. At this point, patient has no symptoms, she is medically stable to be discharged.  She will be followed by PCP in 1 week in the office, check another CBC at the next office visit.  Assessment and Plan: * Hyponatremia Sodium level quickly improved after diarrhea stopped.  Currently asymptomatic, sodium level 129 today, medically stable to be discharged.  She is advised that she has no restriction for salt for the next few days continue seen by family doctor.  Gastroenteritis Condition has resolved, diarrhea stopped, tolerating diet.  Prediabetes Follow-up with PCP as outpatient.  GERD (gastroesophageal reflux disease) Continue PPI.  Post herpetic  neuralgia Resume home medicines, follow-up with PCP as outpatient.        Consultants: None Procedures performed: None  Disposition: Home Diet recommendation:  Discharge Diet Orders (From admission, onward)     Start     Ordered   02/22/22 0000  Diet - low sodium heart healthy        02/22/22 0935           Renal diet DISCHARGE MEDICATION: Allergies as of 02/22/2022       Reactions   Amoxicillin Hives   Hydrocodone Itching   Sulfamethoxazole-trimethoprim Nausea Only   Allopurinol Rash        Medication List     STOP taking these medications    anastrozole 1 MG tablet Commonly known as: ARIMIDEX   Biotin 1 MG Caps   carbamazepine 200 MG tablet Commonly known as: TEGRETOL   DULoxetine 20 MG capsule Commonly known as: CYMBALTA   Lagevrio 200 MG Caps capsule Generic drug: molnupiravir EUA   polyethylene glycol-electrolytes 420 g solution Commonly known as: NuLYTELY   traMADol 50 MG tablet Commonly known as: ULTRAM   valACYclovir 1000 MG tablet Commonly known as: VALTREX   ZINC 15 PO       TAKE these medications    acetaminophen 650 MG CR tablet Commonly known as: TYLENOL Take by mouth.   aspirin EC 81 MG tablet Take 81 mg by mouth daily.   calcium-vitamin D 500-200 MG-UNIT Tabs tablet Commonly known as: OSCAL WITH D Take 1 tablet by mouth daily.   fluticasone 50 MCG/ACT nasal spray Commonly known as: FLONASE Place into the  nose.   gabapentin 800 MG tablet Commonly known as: NEURONTIN Take 1.5 tablets (1,200 mg total) by mouth 2 (two) times daily.   guaiFENesin 600 MG 12 hr tablet Commonly known as: MUCINEX Take 1 tablet (600 mg total) by mouth 2 (two) times daily for 5 days.   omeprazole 20 MG capsule Commonly known as: PRILOSEC Take 1 capsule by mouth once daily   OXcarbazepine 150 MG tablet Commonly known as: TRILEPTAL Take 300 mg by mouth 2 (two) times daily.   vitamin B-12 250 MCG tablet Commonly known as:  CYANOCOBALAMIN Take 250 mcg by mouth daily.        Follow-up Information     Kirk Ruths, MD Follow up in 1 week(s).   Specialty: Internal Medicine Contact information: [address] [place] [address] [phone]                Discharge Exam: Danley Danker Weights   02/21/22 1242  Weight: 83 kg   General exam: Appears calm and comfortable  Respiratory system: Clear to auscultation. Respiratory effort normal. Cardiovascular system: S1 & S2 heard, RRR. No JVD, murmurs, rubs, gallops or clicks. No pedal edema. Gastrointestinal system: Abdomen is nondistended, soft and nontender. No organomegaly or masses felt. Normal bowel sounds heard. Central nervous system: Alert and oriented. No focal neurological deficits. Extremities: Symmetric 5 x 5 power. Skin: No rashes, lesions or ulcers Psychiatry: Judgement and insight appear normal. Mood & affect appropriate.    Condition at discharge: good  The results of significant diagnostics from this hospitalization (including imaging, microbiology, ancillary and laboratory) are listed below for reference.   Imaging Studies: CT ABDOMEN PELVIS W CONTRAST  Result Date: 02/21/2022 CLINICAL DATA:  Abdominal pain EXAM: CT ABDOMEN AND PELVIS WITH CONTRAST TECHNIQUE: Multidetector CT imaging of the abdomen and pelvis was performed using the standard protocol following bolus administration of intravenous contrast. RADIATION DOSE REDUCTION: This exam was performed according to the departmental dose-optimization program which includes automated exposure control, adjustment of the mA and/or kV according to patient size and/or use of iterative reconstruction technique. CONTRAST:  151m OMNIPAQUE IOHEXOL 300 MG/ML  SOLN COMPARISON:  05/27/2010 FINDINGS: Lower chest: Breathing motion limits evaluation of lower lung fields. As far as seen, there is no focal consolidation in the lower lung fields. There is no pleural  effusion. Dense calcification is seen in mitral annulus. Scattered coronary artery calcifications are seen. Hepatobiliary: Liver measures 19.9 cm in length. There is fatty infiltration. Surgical clips are seen in gallbladder fossa. Pancreas: No focal abnormalities are seen. Evaluation of the tail is technically difficult due to lymphadenopathy in splenic hilum. Spleen: Spleen measures 13.9 cm in maximum diameter. Adrenals/Urinary Tract: Adrenals are unremarkable. There is no hydronephrosis. There are no renal or ureteral stones. Urinary bladder is unremarkable. Stomach/Bowel: Small hiatal hernia is seen. Stomach is not distended. Small bowel loops are not dilated. Appendix is not seen. Multiple diverticula are seen in colon without signs of focal diverticulitis. Vascular/Lymphatic: Atherosclerotic plaques and calcifications are seen in aorta and its major branches. There is 2.2 cm ectasia in the infrarenal aorta. No follow-up imaging is recommended. There are enlarged lymph nodes in the retrocrural region, retroperitoneum, along the lesser curvature aspect of the stomach, poorly bodies and along the medial aspect of spleen. Largest of the nodes is noted in the right iliac chain measuring 3.6 cm in short axis measurement. There are other bulky lymph nodes in retroperitoneum. There is interval increase in  size and number of lymph nodes in abdomen and pelvis. Reproductive: Calcified uterine fibroid is seen. Other: There is no ascites or pneumoperitoneum. Musculoskeletal: Degenerative changes are noted in thoracic and lumbar spine, more severe at L2-L3, L3-L4 and L5-S1 levels. There is mild retrolisthesis at L2-L3 level. IMPRESSION: There are abnormally enlarged lymph nodes in retrocrural region, retroperitoneum, mesentery, adjacent to porta hepatis and medial to the spleen. Findings suggest active inflammatory or neoplastic process. PET-CT and tissue sampling as warranted should be considered. Enlarged spleen. Enlarged  fatty liver. Small hiatal hernia. Diverticulosis of colon without signs of focal diverticulitis. Lumbar spondylosis. Other findings as described in the body of the report. Electronically Signed   By: Elmer Picker M.D.   On: 02/21/2022 16:16   CT HEAD WO CONTRAST (5MM)  Result Date: 02/21/2022 CLINICAL DATA:  Headache, sudden, severe EXAM: CT HEAD WITHOUT CONTRAST TECHNIQUE: Contiguous axial images were obtained from the base of the skull through the vertex without intravenous contrast. RADIATION DOSE REDUCTION: This exam was performed according to the departmental dose-optimization program which includes automated exposure control, adjustment of the mA and/or kV according to patient size and/or use of iterative reconstruction technique. COMPARISON:  02/21/2016 FINDINGS: Brain: No evidence of acute infarction, hemorrhage, hydrocephalus, extra-axial collection or mass lesion/mass effect. Scattered low-density changes within the periventricular and subcortical white matter compatible with chronic microvascular ischemic change. Mild diffuse cerebral volume loss. Vascular: Atherosclerotic calcifications involving the large vessels of the skull base. No unexpected hyperdense vessel. Skull: Postsurgical changes to the right suboccipital calvarium. Negative for calvarial fracture. Sinuses/Orbits: Mild mucosal thickening within the ethmoid air cells. Otherwise clear. Other: None. IMPRESSION: 1. No acute intracranial findings. 2. Chronic microvascular ischemic change and cerebral volume loss. Electronically Signed   By: Davina Poke D.O.   On: 02/21/2022 15:59   DG Chest 2 View  Result Date: 02/21/2022 CLINICAL DATA:  Hyponatremia.  Evaluate for thoracic mass. EXAM: CHEST - 2 VIEW COMPARISON:  Radiographs 04/26/2012.  CT 05/27/2010. FINDINGS: The heart size and mediastinal contours are normal. The lungs are clear. There is no pleural effusion or pneumothorax. No acute osseous findings are identified. There  are mild degenerative changes in the spine. Cholecystectomy clips and vascular calcifications are noted. IMPRESSION: No active cardiopulmonary process. No evidence of thoracic mass. Electronically Signed   By: Richardean Sale M.D.   On: 02/21/2022 15:08    Microbiology: Results for orders placed or performed during the hospital encounter of 02/21/22  Resp Panel by RT-PCR (Flu A&B, Covid) Anterior Nasal Swab     Status: None   Collection Time: 02/21/22  5:28 PM   Specimen: Anterior Nasal Swab  Result Value Ref Range Status   SARS Coronavirus 2 by RT PCR NEGATIVE NEGATIVE Final    Comment: (NOTE) SARS-CoV-2 target nucleic acids are NOT DETECTED.  The SARS-CoV-2 RNA is generally detectable in upper respiratory specimens during the acute phase of infection. The lowest concentration of SARS-CoV-2 viral copies this assay can detect is 138 copies/mL. A negative result does not preclude SARS-Cov-2 infection and should not be used as the sole basis for treatment or other patient management decisions. A negative result may occur with  improper specimen collection/handling, submission of specimen other than nasopharyngeal swab, presence of viral mutation(s) within the areas targeted by this assay, and inadequate number of viral copies(<138 copies/mL). A negative result must be combined with clinical observations, patient history, and epidemiological information. The expected result is Negative.  Fact Sheet for Patients:  EntrepreneurPulse.com.au  Fact Sheet for Healthcare Providers:  IncredibleEmployment.be  This test is no t yet approved or cleared by the [place] FDA and  has been authorized for detection and/or diagnosis of SARS-CoV-2 by FDA under an Emergency Use Authorization (EUA). This EUA will remain  in effect (meaning this test can be used) for the duration of the COVID-19 declaration under Section 564(b)(1) of the Act, 21 U.S.C.section  360bbb-3(b)(1), unless the authorization is terminated  or revoked sooner.       Influenza A by PCR NEGATIVE NEGATIVE Final   Influenza B by PCR NEGATIVE NEGATIVE Final    Comment: (NOTE) The Xpert Xpress SARS-CoV-2/FLU/RSV plus assay is intended as an aid in the diagnosis of influenza from Nasopharyngeal swab specimens and should not be used as a sole basis for treatment. Nasal washings and aspirates are unacceptable for Xpert Xpress SARS-CoV-2/FLU/RSV testing.  Fact Sheet for Patients: EntrepreneurPulse.com.au  Fact Sheet for Healthcare Providers: IncredibleEmployment.be  This test is not yet approved or cleared by the [place] FDA and has been authorized for detection and/or diagnosis of SARS-CoV-2 by FDA under an Emergency Use Authorization (EUA). This EUA will remain in effect (meaning this test can be used) for the duration of the COVID-19 declaration under Section 564(b)(1) of the Act, 21 U.S.C. section 360bbb-3(b)(1), unless the authorization is terminated or revoked.  Performed at [facility], [address] [place], [place] [address]     Labs: CBC: Recent Labs  Lab 02/21/22 1248 02/22/22 0657  WBC 9.0 6.3  HGB 11.6* 11.6*  HCT 34.9* 35.4*  MCV 77.4* 77.0*  PLT 228 295   Basic Metabolic Panel: Recent Labs  Lab 02/21/22 1248 02/22/22 0648 02/22/22 0657  NA 123*  --  129*  K 4.1  --  4.0  CL 89*  --  97*  CO2 25  --  27  GLUCOSE 156*  --  108*  BUN 15  --  13  CREATININE 0.60  --  0.51  CALCIUM 8.4*  --  8.5*  MG  --  2.1  --    Liver Function Tests: Recent Labs  Lab 02/21/22 1248  AST 26  ALT 19  ALKPHOS 98  BILITOT 0.7  PROT 6.6  ALBUMIN 3.9   CBG: Recent Labs  Lab 02/21/22 2043 02/22/22 0743  GLUCAP 116* 103*    Discharge time spent: greater than 30 minutes.  Signed: Sharen Hones, MD [facility] 02/22/2022

## 2022-02-22 NOTE — Progress Notes (Signed)
Pt A/Ox4 upon review of AVS. Patient aware of follow up appt to schedule with PCP. Assisted patiet with getting dressed. PIV removed. Will escort patient to entrance when ready. Daughter driving home.

## 2022-02-24 ENCOUNTER — Inpatient Hospital Stay
Admission: EM | Admit: 2022-02-24 | Discharge: 2022-02-27 | DRG: 871 | Disposition: A | Discharge status: Home Health | Payer: Medicare Other | Attending: Hospitalist | Admitting: Hospitalist

## 2022-02-24 ENCOUNTER — Other Ambulatory Visit: Payer: Self-pay

## 2022-02-24 ENCOUNTER — Emergency Department: Payer: Medicare Other

## 2022-02-24 DIAGNOSIS — D0511 Intraductal carcinoma in situ of right breast: Secondary | ICD-10-CM | POA: Diagnosis present

## 2022-02-24 DIAGNOSIS — I440 Atrioventricular block, first degree: Secondary | ICD-10-CM | POA: Diagnosis present

## 2022-02-24 DIAGNOSIS — G9341 Metabolic encephalopathy: Secondary | ICD-10-CM | POA: Diagnosis present

## 2022-02-24 DIAGNOSIS — Z882 Allergy status to sulfonamides status: Secondary | ICD-10-CM

## 2022-02-24 DIAGNOSIS — Z880 Allergy status to penicillin: Secondary | ICD-10-CM

## 2022-02-24 DIAGNOSIS — Z7982 Long term (current) use of aspirin: Secondary | ICD-10-CM

## 2022-02-24 DIAGNOSIS — Z9049 Acquired absence of other specified parts of digestive tract: Secondary | ICD-10-CM

## 2022-02-24 DIAGNOSIS — Z20822 Contact with and (suspected) exposure to covid-19: Secondary | ICD-10-CM | POA: Diagnosis present

## 2022-02-24 DIAGNOSIS — A4150 Gram-negative sepsis, unspecified: Secondary | ICD-10-CM | POA: Diagnosis present

## 2022-02-24 DIAGNOSIS — R509 Fever, unspecified: Secondary | ICD-10-CM

## 2022-02-24 DIAGNOSIS — B0229 Other postherpetic nervous system involvement: Secondary | ICD-10-CM

## 2022-02-24 DIAGNOSIS — Z8673 Personal history of transient ischemic attack (TIA), and cerebral infarction without residual deficits: Secondary | ICD-10-CM

## 2022-02-24 DIAGNOSIS — R6510 Systemic inflammatory response syndrome (SIRS) of non-infectious origin without acute organ dysfunction: Secondary | ICD-10-CM

## 2022-02-24 DIAGNOSIS — K529 Noninfective gastroenteritis and colitis, unspecified: Secondary | ICD-10-CM | POA: Diagnosis present

## 2022-02-24 DIAGNOSIS — D649 Anemia, unspecified: Secondary | ICD-10-CM | POA: Diagnosis present

## 2022-02-24 DIAGNOSIS — Z79899 Other long term (current) drug therapy: Secondary | ICD-10-CM

## 2022-02-24 DIAGNOSIS — R7303 Prediabetes: Secondary | ICD-10-CM | POA: Diagnosis present

## 2022-02-24 DIAGNOSIS — A4151 Sepsis due to Escherichia coli [E. coli]: Principal | ICD-10-CM | POA: Diagnosis present

## 2022-02-24 DIAGNOSIS — Z888 Allergy status to other drugs, medicaments and biological substances status: Secondary | ICD-10-CM

## 2022-02-24 DIAGNOSIS — Z885 Allergy status to narcotic agent status: Secondary | ICD-10-CM

## 2022-02-24 DIAGNOSIS — A419 Sepsis, unspecified organism: Secondary | ICD-10-CM

## 2022-02-24 DIAGNOSIS — R296 Repeated falls: Secondary | ICD-10-CM | POA: Diagnosis present

## 2022-02-24 DIAGNOSIS — Z923 Personal history of irradiation: Secondary | ICD-10-CM

## 2022-02-24 DIAGNOSIS — Z803 Family history of malignant neoplasm of breast: Secondary | ICD-10-CM

## 2022-02-24 DIAGNOSIS — C9110 Chronic lymphocytic leukemia of B-cell type not having achieved remission: Secondary | ICD-10-CM | POA: Diagnosis present

## 2022-02-24 DIAGNOSIS — Z853 Personal history of malignant neoplasm of breast: Secondary | ICD-10-CM

## 2022-02-24 DIAGNOSIS — K219 Gastro-esophageal reflux disease without esophagitis: Secondary | ICD-10-CM | POA: Diagnosis present

## 2022-02-24 DIAGNOSIS — R531 Weakness: Secondary | ICD-10-CM

## 2022-02-24 DIAGNOSIS — I959 Hypotension, unspecified: Secondary | ICD-10-CM | POA: Diagnosis present

## 2022-02-24 DIAGNOSIS — N390 Urinary tract infection, site not specified: Secondary | ICD-10-CM

## 2022-02-24 DIAGNOSIS — R058 Other specified cough: Secondary | ICD-10-CM

## 2022-02-24 DIAGNOSIS — E871 Hypo-osmolality and hyponatremia: Secondary | ICD-10-CM | POA: Diagnosis present

## 2022-02-24 DIAGNOSIS — E869 Volume depletion, unspecified: Secondary | ICD-10-CM | POA: Diagnosis present

## 2022-02-24 DIAGNOSIS — R059 Cough, unspecified: Secondary | ICD-10-CM | POA: Diagnosis present

## 2022-02-24 DIAGNOSIS — Z85828 Personal history of other malignant neoplasm of skin: Secondary | ICD-10-CM

## 2022-02-24 DIAGNOSIS — I10 Essential (primary) hypertension: Secondary | ICD-10-CM | POA: Diagnosis present

## 2022-02-24 HISTORY — DX: Hypo-osmolality and hyponatremia: E87.1

## 2022-02-24 LAB — COMPREHENSIVE METABOLIC PANEL
ALT: 21 U/L (ref 0–44)
AST: 28 U/L (ref 15–41)
Albumin: 4.2 g/dL (ref 3.5–5.0)
Alkaline Phosphatase: 88 U/L (ref 38–126)
Anion gap: 9 (ref 5–15)
BUN: 23 mg/dL (ref 8–23)
CO2: 21 mmol/L — ABNORMAL LOW (ref 22–32)
Calcium: 8.8 mg/dL — ABNORMAL LOW (ref 8.9–10.3)
Chloride: 99 mmol/L (ref 98–111)
Creatinine, Ser: 0.92 mg/dL (ref 0.44–1.00)
GFR, Estimated: >60 mL/min (ref >60)
Glucose, Bld: 155 mg/dL — ABNORMAL HIGH (ref 70–99)
Potassium: 4.2 mmol/L (ref 3.5–5.1)
Sodium: 129 mmol/L — ABNORMAL LOW (ref 135–145)
Total Bilirubin: 0.6 mg/dL (ref 0.3–1.2)
Total Protein: 6.9 g/dL (ref 6.5–8.1)

## 2022-02-24 LAB — CBC WITH DIFFERENTIAL/PLATELET
Abs Immature Granulocytes: 0.17 10*3/uL — ABNORMAL HIGH (ref 0.00–0.07)
Basophils Absolute: 0.1 10*3/uL (ref 0.0–0.1)
Basophils Relative: 0 %
Eosinophils Absolute: 0 10*3/uL (ref 0.0–0.5)
Eosinophils Relative: 0 %
HCT: 36.2 % (ref 36.0–46.0)
Hemoglobin: 11.8 g/dL — ABNORMAL LOW (ref 12.0–15.0)
Immature Granulocytes: 1 %
Lymphocytes Relative: 43 %
Lymphs Abs: 6.7 10*3/uL — ABNORMAL HIGH (ref 0.7–4.0)
MCH: 25.5 pg — ABNORMAL LOW (ref 26.0–34.0)
MCHC: 32.6 g/dL (ref 30.0–36.0)
MCV: 78.4 fL — ABNORMAL LOW (ref 80.0–100.0)
Monocytes Absolute: 1.5 10*3/uL — ABNORMAL HIGH (ref 0.1–1.0)
Monocytes Relative: 10 %
Neutro Abs: 7.1 10*3/uL (ref 1.7–7.7)
Neutrophils Relative %: 46 %
Platelets: 239 10*3/uL (ref 150–400)
RBC: 4.62 MIL/uL (ref 3.87–5.11)
RDW: 15.4 % (ref 11.5–15.5)
Smear Review: NORMAL
WBC: 15.4 10*3/uL — ABNORMAL HIGH (ref 4.0–10.5)
nRBC: 0 % (ref 0.0–0.2)

## 2022-02-24 LAB — URINALYSIS, ROUTINE W REFLEX MICROSCOPIC
Bilirubin Urine: NEGATIVE
Glucose, UA: NEGATIVE mg/dL
Ketones, ur: NEGATIVE mg/dL
Nitrite: NEGATIVE
Protein, ur: 100 mg/dL — AB
RBC / HPF: >50 RBC/hpf — ABNORMAL HIGH (ref 0–5)
Specific Gravity, Urine: 1.016 (ref 1.005–1.030)
WBC, UA: >50 WBC/hpf — ABNORMAL HIGH (ref 0–5)
pH: 5 (ref 5.0–8.0)

## 2022-02-24 LAB — LACTIC ACID, PLASMA: Lactic Acid, Venous: 1.5 mmol/L (ref 0.5–1.9)

## 2022-02-24 MED ORDER — LACTATED RINGERS IV BOLUS (SEPSIS): 1000.0000 mL | Freq: Once | INTRAVENOUS | Status: DC

## 2022-02-24 MED ORDER — ACETAMINOPHEN 500 MG PO TABS
1000.0000 mg | ORAL_TABLET | Freq: Once | ORAL | Status: AC
  Administered 2022-02-25: 1000 mg via ORAL
  Filled 2022-02-24: qty 2

## 2022-02-24 MED ORDER — SODIUM CHLORIDE 0.9 % IV SOLN
1.0000 g | Freq: Once | INTRAVENOUS | Status: AC
  Administered 2022-02-25: 1 g via INTRAVENOUS
  Filled 2022-02-24: qty 20

## 2022-02-24 MED ORDER — LACTATED RINGERS IV BOLUS (SEPSIS)
1000.0000 mL | Freq: Once | INTRAVENOUS | Status: AC
  Administered 2022-02-25: 1000 mL via INTRAVENOUS

## 2022-02-24 NOTE — ED Triage Notes (Signed)
P to ED via POV. Was supposed to see PCP today but was late to appt because legs were weak. They told her to go to walk in clinic instead. Pt was then told to come to ER.   Pt states both legs feel weak and heavy. Legs are giving out on her. Weakness started around 1130am.   Pt also thinks may have UTI. Urine cloudy, urination slightly painful.  Husband states last week pt seen here for hyponatremia and since then has been having "memory problems".   Oral temp is 100.9.

## 2022-02-24 NOTE — ED Provider Notes (Signed)
[facility] Provider Note    Event Date/Time   First MD Initiated Contact with Patient 02/24/22 2344     (approximate)   History   Extremity Weakness   HPI  Joanna Phillips is a 71 y.o. female referred to the ED from PCP and  [facility] for chief complaint of generalized weakness, fall secondary to weakness, feeling like she has a UTI.  Patient was seen in the ED last week for hyponatremia with sodium 117.  Spouse also reports "memory problems" and low-grade fevers.  Patient denies cough, chest pain, shortness of breath, abdominal pain, nausea, vomiting or dizziness.  She was unable to get into the car today secondary to generalized weakness.     Past Medical History   Past Medical History:  Diagnosis Date   Arthritis    Breast cancer ([place]) 2017   DCIS   CLL (chronic lymphocytic leukemia) ([place]) 2011   DCIS (ductal carcinoma in situ) 03/11/2016   GERD (gastroesophageal reflux disease)    Hyponatremia    Personal history of radiation therapy 2017     Active Problem List   Patient Active Problem List   Diagnosis Date Noted   Sepsis due to gram-negative UTI ([place]) 02/25/2022   Hyponatremia 02/21/2022   Gastroenteritis 02/21/2022   Leg pain 08/10/2019   Lymphedema 08/10/2019   Arthritis of knee 08/04/2019   Sciatica 08/04/2019   Elevated hemoglobin A1c 05/24/2019   Healthcare maintenance 05/24/2019   Prediabetes 07/19/2018   Ductal carcinoma in situ (DCIS) of right breast 11/05/2017   Open scalp wound 06/23/2016   GERD (gastroesophageal reflux disease) 02/29/2016   History of ductal carcinoma in situ (DCIS) of breast 02/29/2016   Post herpetic neuralgia 02/13/2016   Breast calcification, right 02/12/2016   Parotitis 01/22/2016   Adenopathy 01/01/2016   Lymphocytosis 01/01/2016   Periorbital cellulitis of right eye 12/24/2015   History of shingles 12/21/2015   Anemia 12/14/2015   Subclinical hypothyroidism 10/11/2015   Recurrent cold sores  10/11/2015   BPPV (benign paroxysmal positional vertigo) 06/28/2015   Allergic rhinitis due to pollen 06/28/2015   Elevated uric acid in blood 06/08/2015   Venous insufficiency 09/29/2013   CLL (chronic lymphocytic leukemia) ([place]) 04/18/2010     Past Surgical History   Past Surgical History:  Procedure Laterality Date   BREAST BIOPSY Right 02/12/2016   DCIS   BREAST BIOPSY Right 12/27/2019   path pending, calcs, x marker   BREAST LUMPECTOMY Right 03/11/2016   DCIS neg margins   CHOLECYSTECTOMY  1996   COLONOSCOPY  2013   COLONOSCOPY WITH PROPOFOL N/A 04/19/2021   Procedure: COLONOSCOPY WITH PROPOFOL;  Surgeon: Annamaria Helling, DO;  Location: [facility] ENDOSCOPY;  Service: Gastroenterology;  Laterality: N/A;   squamous cell skin on scalp surgery Right      Home Medications   Prior to Admission medications   Medication Sig Start Date End Date Taking? Authorizing Provider  aspirin EC 81 MG tablet Take 81 mg by mouth daily.   Yes [provider]  calcium-vitamin D (OSCAL WITH D) 500-200 MG-UNIT TABS tablet Take 1 tablet by mouth daily.   Yes [provider]  Carboxymethylcellulose Sodium (REFRESH CELLUVISC OP) Apply 1 drop to eye every 4 (four) hours as needed.   Yes [provider]  fluticasone (FLONASE) 50 MCG/ACT nasal spray Place into the nose. 09/10/20  Yes [provider]  gabapentin (NEURONTIN) 800 MG tablet Take 1.5 tablets (1,200 mg total) by mouth 2 (two) times  daily. 01/11/19  Yes Mikey College, NP  guaiFENesin (MUCINEX) 600 MG 12 hr tablet Take 1 tablet (600 mg total) by mouth 2 (two) times daily for 5 days. 02/22/22 02/27/22 Yes Sharen Hones, MD  omeprazole (PRILOSEC) 20 MG capsule Take 1 capsule by mouth once daily 01/11/19  Yes Mikey College, NP  OXcarbazepine (TRILEPTAL) 150 MG tablet Take 300 mg by mouth 2 (two) times daily. 08/21/21  Yes [provider]  vitamin B-12 (CYANOCOBALAMIN) 250 MCG tablet Take 250  mcg by mouth daily.   Yes [provider]  acetaminophen (TYLENOL) 650 MG CR tablet Take by mouth.    [provider]     Allergies  Amoxicillin, Hydrocodone, Sulfamethoxazole-trimethoprim, and Allopurinol   Family History   Family History  Problem Relation Age of Onset   Cancer Father        lung   Diabetes Mother    Breast cancer Maternal Aunt 60   Breast cancer Maternal Aunt 13     Physical Exam  Triage Vital Signs: ED Triage Vitals  Enc Vitals Group     BP 02/24/22 1411 121/61     Pulse Rate 02/24/22 1411 (!) 106     Resp 02/24/22 1411 16     Temp 02/24/22 1411 (!) 100.9 F (38.3 C)     Temp Source 02/24/22 1411 Oral     SpO2 02/24/22 1411 98 %     Weight 02/24/22 1407 182 lb 15.7 oz (83 kg)     Height 02/24/22 1407 '5\' 4"'$  (1.626 m)     Head Circumference --      Peak Flow --      Pain Score 02/24/22 1405 0     Pain Loc --      Pain Edu? --      Excl. in [place]? --     Updated Vital Signs: BP (!) 111/50   Pulse 92   Temp (!) 101.7 F (38.7 C) (Oral)   Resp 16   Ht '5\' 4"'$  (1.626 m)   Wt 83 kg   SpO2 99%   BMI 31.41 kg/m    General: Awake, no distress.  CV:  RRR.  Good peripheral perfusion.  Resp:  Normal effort.  CTA B. Abd:  Nontender.  No distention.  Other:  Alert and oriented x3.  CN II toXII grossly intact.  5/5 motor strength and sensation all extremities. MAEx4.    ED Results / Procedures / Treatments  Labs (all labs ordered are listed, but only abnormal results are displayed) Labs Reviewed  COMPREHENSIVE METABOLIC PANEL - Abnormal; Notable for the following components:      Result Value   Sodium 129 (*)    CO2 21 (*)    Glucose, Bld 155 (*)    Calcium 8.8 (*)    All other components within normal limits  CBC WITH DIFFERENTIAL/PLATELET - Abnormal; Notable for the following components:   WBC 15.4 (*)    Hemoglobin 11.8 (*)    MCV 78.4 (*)    MCH 25.5 (*)    Lymphs Abs 6.7 (*)    Monocytes Absolute 1.5 (*)    Abs  Immature Granulocytes 0.17 (*)    All other components within normal limits  URINALYSIS, ROUTINE W REFLEX MICROSCOPIC - Abnormal; Notable for the following components:   Color, Urine YELLOW (*)    APPearance TURBID (*)    Hgb urine dipstick MODERATE (*)    Protein, ur 100 (*)    Leukocytes,Ua  LARGE (*)    RBC / HPF >50 (*)    WBC, UA >50 (*)    Bacteria, UA FEW (*)    All other components within normal limits  RESP PANEL BY RT-PCR (FLU A&B, COVID) ARPGX2  CULTURE, BLOOD (ROUTINE X 2)  CULTURE, BLOOD (ROUTINE X 2)  URINE CULTURE  LACTIC ACID, PLASMA  PROCALCITONIN  PROTIME-INR  CORTISOL-AM, BLOOD  PROCALCITONIN  BASIC METABOLIC PANEL  CBC     EKG  ED ECG REPORT I, Kou Gucciardo J, the attending physician, personally viewed and interpreted this ECG.   Date: 02/25/2022  EKG Time: 0007  Rate: 94  Rhythm: normal sinus rhythm  Axis: Normal  Intervals:none  ST&T Change: Nonspecific    RADIOLOGY I have independently visualized and interpreted patient's chest x-ray as well as noted the radiology interpretation:  Chest x-ray: No acute cardiopulmonary process  Official radiology report(s): DG Chest 2 View  Result Date: 02/24/2022 CLINICAL DATA:  Fever, weakness. EXAM: CHEST - 2 VIEW COMPARISON:  Chest x-rays dated 02/21/2022 and 04/26/2012. FINDINGS: Heart size and mediastinal contours are within normal limits. Lungs are clear. No pleural effusion or pneumothorax is seen. Osseous structures about the chest are unremarkable. IMPRESSION: No active cardiopulmonary disease. No evidence of pneumonia or pulmonary edema. Electronically Signed   By: Franki Cabot M.D.   On: 02/24/2022 14:56     PROCEDURES:  Critical Care performed: Yes CRITICAL CARE Performed by: Paulette Blanch   Total critical care time: 30 minutes  Critical care time was exclusive of separately billable procedures and treating other patients.  Critical care was necessary to treat or prevent imminent or  life-threatening deterioration.  Critical care was time spent personally by me on the following activities: development of treatment plan with patient and/or surrogate as well as nursing, discussions with consultants, evaluation of patient's response to treatment, examination of patient, obtaining history from patient or surrogate, ordering and performing treatments and interventions, ordering and review of laboratory studies, ordering and review of radiographic studies, pulse oximetry and re-evaluation of patient's condition.   Marland Kitchen1-3 Lead EKG Interpretation  Performed by: Paulette Blanch, MD Authorized by: Paulette Blanch, MD     Interpretation: normal     ECG rate:  95   ECG rate assessment: normal     Rhythm: sinus rhythm     Ectopy: none     Conduction: normal   Comments:     Patient placed on cardiac monitor to evaluate for arrhythmias    MEDICATIONS ORDERED IN ED: Medications  enoxaparin (LOVENOX) injection 40 mg (has no administration in time range)  0.9 %  sodium chloride infusion ( Intravenous New Bag/Given 02/25/22 0057)  ondansetron (ZOFRAN) tablet 4 mg (has no administration in time range)    Or  ondansetron (ZOFRAN) injection 4 mg (has no administration in time range)  magnesium hydroxide (MILK OF MAGNESIA) suspension 30 mL (has no administration in time range)  traZODone (DESYREL) tablet 25 mg (has no administration in time range)  acetaminophen (TYLENOL) tablet 650 mg (has no administration in time range)    Or  acetaminophen (TYLENOL) suppository 650 mg (has no administration in time range)  levofloxacin (LEVAQUIN) IVPB 500 mg (has no administration in time range)  acetaminophen (TYLENOL) tablet 1,000 mg (1,000 mg Oral Given 02/25/22 0012)  lactated ringers bolus 1,000 mL (0 mLs Intravenous Stopped 02/25/22 0116)  meropenem (MERREM) 1 g in sodium chloride 0.9 % 100 mL IVPB (0 g Intravenous Stopped 02/25/22 0046)  IMPRESSION / MDM / ASSESSMENT AND PLAN / ED COURSE   I reviewed the triage vital signs and the nursing notes.                             71 year old female presenting with generalized weakness, frequent falls secondary to weakness, cloudy, foul-smelling urine.  Frontal diagnosis includes but is not limited to sepsis, UTI, community-acquired pneumonia, ACS, metabolic, infectious etiologies, etc.  I have personally reviewed patient's records and note her recent hospitalization from 02/21/2022 for hyponatremia and diarrhea.  Patient's presentation is most consistent with acute presentation with potential threat to life or bodily function.  The patient is on the cardiac monitor to evaluate for evidence of arrhythmia and/or significant heart rate changes.  On arrival to the ED patient was febrile, tachycardic and tachypneic; meeting sepsis criteria.  Laboratory results demonstrate moderate leukocytosis WBC 15.4, mild hyponatremia sodium 129, urine demonstrates large leukocytes with greater than 50 WBC.  Lactate and procalcitonin both negative.  Will initiate IV fluid resuscitation, Tylenol for fever, IV meropenem to cover UTI.  Will discuss with hospitalist services for evaluation and admission.      FINAL CLINICAL IMPRESSION(S) / ED DIAGNOSES   Final diagnoses:  Urinary tract infection without hematuria, site unspecified  Generalized weakness  Fever, unspecified fever cause  Sepsis, due to unspecified organism, unspecified whether acute organ dysfunction present [facility])     Rx / DC Orders   ED Discharge Orders     None        Note:  This document was prepared using Dragon voice recognition software and may include unintentional dictation errors.   Paulette Blanch, MD 02/25/22 Areta Haber

## 2022-02-24 NOTE — ED Provider Triage Note (Signed)
Emergency Medicine Provider Triage Evaluation Note  Joanna Phillips , a 71 y.o. female  was evaluated in triage.  Pt complains of weakness and cloudy urine. Recently discharged after hyponatremia was corrected. Has not felt well since discharge. No pain, nausea, vomiting, or diarrhea.  Physical Exam  Ht '5\' 4"'$  (1.626 m)   Wt 83 kg   BMI 31.41 kg/m  Gen:   Awake, no distress   Resp:  Normal effort  MSK:   Moves extremities without difficulty  Other:    Medical Decision Making  Medically screening exam initiated at 2:11 PM.  Appropriate orders placed.  Joanna Phillips was informed that the remainder of the evaluation will be completed by another provider, this initial triage assessment does not replace that evaluation, and the importance of remaining in the ED until their evaluation is complete.   Victorino Dike, FNP 02/24/22 1413

## 2022-02-25 ENCOUNTER — Other Ambulatory Visit: Payer: Self-pay

## 2022-02-25 DIAGNOSIS — Z803 Family history of malignant neoplasm of breast: Secondary | ICD-10-CM | POA: Diagnosis not present

## 2022-02-25 DIAGNOSIS — Z923 Personal history of irradiation: Secondary | ICD-10-CM | POA: Diagnosis not present

## 2022-02-25 DIAGNOSIS — R531 Weakness: Secondary | ICD-10-CM | POA: Diagnosis present

## 2022-02-25 DIAGNOSIS — A4151 Sepsis due to Escherichia coli [E. coli]: Secondary | ICD-10-CM | POA: Diagnosis present

## 2022-02-25 DIAGNOSIS — R058 Other specified cough: Secondary | ICD-10-CM

## 2022-02-25 DIAGNOSIS — C9110 Chronic lymphocytic leukemia of B-cell type not having achieved remission: Secondary | ICD-10-CM | POA: Diagnosis present

## 2022-02-25 DIAGNOSIS — N390 Urinary tract infection, site not specified: Secondary | ICD-10-CM | POA: Diagnosis present

## 2022-02-25 DIAGNOSIS — Z880 Allergy status to penicillin: Secondary | ICD-10-CM | POA: Diagnosis not present

## 2022-02-25 DIAGNOSIS — Z79899 Other long term (current) drug therapy: Secondary | ICD-10-CM | POA: Diagnosis not present

## 2022-02-25 DIAGNOSIS — A4150 Gram-negative sepsis, unspecified: Secondary | ICD-10-CM | POA: Diagnosis not present

## 2022-02-25 DIAGNOSIS — G9341 Metabolic encephalopathy: Secondary | ICD-10-CM

## 2022-02-25 DIAGNOSIS — I440 Atrioventricular block, first degree: Secondary | ICD-10-CM | POA: Diagnosis present

## 2022-02-25 DIAGNOSIS — K219 Gastro-esophageal reflux disease without esophagitis: Secondary | ICD-10-CM | POA: Diagnosis present

## 2022-02-25 DIAGNOSIS — Z8673 Personal history of transient ischemic attack (TIA), and cerebral infarction without residual deficits: Secondary | ICD-10-CM | POA: Diagnosis not present

## 2022-02-25 DIAGNOSIS — B0229 Other postherpetic nervous system involvement: Secondary | ICD-10-CM | POA: Diagnosis present

## 2022-02-25 DIAGNOSIS — I10 Essential (primary) hypertension: Secondary | ICD-10-CM | POA: Diagnosis present

## 2022-02-25 DIAGNOSIS — E869 Volume depletion, unspecified: Secondary | ICD-10-CM | POA: Diagnosis present

## 2022-02-25 DIAGNOSIS — D0511 Intraductal carcinoma in situ of right breast: Secondary | ICD-10-CM | POA: Diagnosis present

## 2022-02-25 DIAGNOSIS — R7303 Prediabetes: Secondary | ICD-10-CM | POA: Diagnosis present

## 2022-02-25 DIAGNOSIS — K529 Noninfective gastroenteritis and colitis, unspecified: Secondary | ICD-10-CM | POA: Diagnosis present

## 2022-02-25 DIAGNOSIS — E871 Hypo-osmolality and hyponatremia: Secondary | ICD-10-CM | POA: Diagnosis present

## 2022-02-25 DIAGNOSIS — R059 Cough, unspecified: Secondary | ICD-10-CM | POA: Diagnosis present

## 2022-02-25 DIAGNOSIS — Z853 Personal history of malignant neoplasm of breast: Secondary | ICD-10-CM | POA: Diagnosis not present

## 2022-02-25 DIAGNOSIS — R296 Repeated falls: Secondary | ICD-10-CM | POA: Diagnosis present

## 2022-02-25 DIAGNOSIS — I959 Hypotension, unspecified: Secondary | ICD-10-CM | POA: Diagnosis present

## 2022-02-25 DIAGNOSIS — D649 Anemia, unspecified: Secondary | ICD-10-CM | POA: Diagnosis present

## 2022-02-25 DIAGNOSIS — Z20822 Contact with and (suspected) exposure to covid-19: Secondary | ICD-10-CM | POA: Diagnosis present

## 2022-02-25 LAB — RESP PANEL BY RT-PCR (FLU A&B, COVID) ARPGX2
Influenza A by PCR: NEGATIVE
Influenza B by PCR: NEGATIVE
SARS Coronavirus 2 by RT PCR: NEGATIVE

## 2022-02-25 LAB — BASIC METABOLIC PANEL
Anion gap: 7 (ref 5–15)
BUN: 25 mg/dL — ABNORMAL HIGH (ref 8–23)
CO2: 21 mmol/L — ABNORMAL LOW (ref 22–32)
Calcium: 7.6 mg/dL — ABNORMAL LOW (ref 8.9–10.3)
Chloride: 104 mmol/L (ref 98–111)
Creatinine, Ser: 0.9 mg/dL (ref 0.44–1.00)
GFR, Estimated: >60 mL/min (ref >60)
Glucose, Bld: 135 mg/dL — ABNORMAL HIGH (ref 70–99)
Potassium: 3.3 mmol/L — ABNORMAL LOW (ref 3.5–5.1)
Sodium: 132 mmol/L — ABNORMAL LOW (ref 135–145)

## 2022-02-25 LAB — CORTISOL-AM, BLOOD: Cortisol - AM: 12.5 ug/dL (ref 6.7–22.6)

## 2022-02-25 LAB — PROCALCITONIN
Procalcitonin: <0.1 ng/mL
Procalcitonin: 0.19 ng/mL

## 2022-02-25 LAB — CBC
HCT: 28.2 % — ABNORMAL LOW (ref 36.0–46.0)
Hemoglobin: 9.2 g/dL — ABNORMAL LOW (ref 12.0–15.0)
MCH: 25.8 pg — ABNORMAL LOW (ref 26.0–34.0)
MCHC: 32.6 g/dL (ref 30.0–36.0)
MCV: 79 fL — ABNORMAL LOW (ref 80.0–100.0)
Platelets: 187 10*3/uL (ref 150–400)
RBC: 3.57 MIL/uL — ABNORMAL LOW (ref 3.87–5.11)
RDW: 15.3 % (ref 11.5–15.5)
WBC: 11.8 10*3/uL — ABNORMAL HIGH (ref 4.0–10.5)
nRBC: 0 % (ref 0.0–0.2)

## 2022-02-25 LAB — PROTIME-INR
INR: 1.3 — ABNORMAL HIGH (ref 0.8–1.2)
Prothrombin Time: 16.1 seconds — ABNORMAL HIGH (ref 11.4–15.2)

## 2022-02-25 MED ORDER — GABAPENTIN 800 MG PO TABS
800.0000 mg | ORAL_TABLET | Freq: Three times a day (TID) | ORAL | Status: DC
  Filled 2022-02-25: qty 1

## 2022-02-25 MED ORDER — SODIUM CHLORIDE 0.9 % IV SOLN: 1.0000 g | Freq: Three times a day (TID) | INTRAVENOUS | Status: DC

## 2022-02-25 MED ORDER — OXCARBAZEPINE 300 MG PO TABS
300.0000 mg | ORAL_TABLET | Freq: Two times a day (BID) | ORAL | Status: DC
  Administered 2022-02-25 – 2022-02-27 (×6): 300 mg via ORAL
  Filled 2022-02-25 (×6): qty 1

## 2022-02-25 MED ORDER — ONDANSETRON HCL 4 MG PO TABS: 4.0000 mg | ORAL_TABLET | Freq: Four times a day (QID) | ORAL | Status: DC | PRN

## 2022-02-25 MED ORDER — SODIUM CHLORIDE 0.9 % IV BOLUS
1000.0000 mL | Freq: Once | INTRAVENOUS | Status: AC
  Administered 2022-02-25: 1000 mL via INTRAVENOUS

## 2022-02-25 MED ORDER — TRAZODONE HCL 50 MG PO TABS: 25.0000 mg | ORAL_TABLET | Freq: Every evening | ORAL | Status: DC | PRN

## 2022-02-25 MED ORDER — GABAPENTIN 300 MG PO CAPS
800.0000 mg | ORAL_CAPSULE | Freq: Three times a day (TID) | ORAL | Status: DC
  Administered 2022-02-25 (×2): 800 mg via ORAL
  Filled 2022-02-25: qty 2

## 2022-02-25 MED ORDER — MIDODRINE HCL 5 MG PO TABS
10.0000 mg | ORAL_TABLET | Freq: Three times a day (TID) | ORAL | Status: DC
  Administered 2022-02-25 – 2022-02-27 (×7): 10 mg via ORAL
  Filled 2022-02-25 (×7): qty 2

## 2022-02-25 MED ORDER — SODIUM CHLORIDE 0.9 % IV BOLUS
500.0000 mL | Freq: Once | INTRAVENOUS | Status: AC
  Administered 2022-02-25: 500 mL via INTRAVENOUS

## 2022-02-25 MED ORDER — FLUTICASONE PROPIONATE 50 MCG/ACT NA SUSP
2.0000 | Freq: Every day | NASAL | Status: DC
  Administered 2022-02-25 – 2022-02-26 (×2): 2 via NASAL
  Filled 2022-02-25: qty 16

## 2022-02-25 MED ORDER — PANTOPRAZOLE SODIUM 40 MG PO TBEC
40.0000 mg | DELAYED_RELEASE_TABLET | Freq: Every day | ORAL | Status: DC
  Administered 2022-02-25 – 2022-02-27 (×3): 40 mg via ORAL
  Filled 2022-02-25 (×3): qty 1

## 2022-02-25 MED ORDER — SODIUM CHLORIDE 0.9 % IV SOLN: 1.0000 g | Freq: Once | INTRAVENOUS | Status: DC

## 2022-02-25 MED ORDER — GABAPENTIN 600 MG PO TABS
1200.0000 mg | ORAL_TABLET | Freq: Two times a day (BID) | ORAL | Status: DC
  Filled 2022-02-25: qty 2

## 2022-02-25 MED ORDER — ONDANSETRON HCL 4 MG/2ML IJ SOLN: 4.0000 mg | Freq: Four times a day (QID) | INTRAMUSCULAR | Status: DC | PRN

## 2022-02-25 MED ORDER — LEVOFLOXACIN IN D5W 500 MG/100ML IV SOLN
500.0000 mg | INTRAVENOUS | Status: DC
  Administered 2022-02-25 – 2022-02-27 (×3): 500 mg via INTRAVENOUS
  Filled 2022-02-25 (×3): qty 100

## 2022-02-25 MED ORDER — MIDODRINE HCL 5 MG PO TABS
10.0000 mg | ORAL_TABLET | ORAL | Status: AC
  Administered 2022-02-25: 10 mg via ORAL
  Filled 2022-02-25: qty 2

## 2022-02-25 MED ORDER — MAGNESIUM HYDROXIDE 400 MG/5ML PO SUSP: 30.0000 mL | Freq: Every day | ORAL | Status: DC | PRN

## 2022-02-25 MED ORDER — ACETAMINOPHEN 325 MG PO TABS
650.0000 mg | ORAL_TABLET | Freq: Four times a day (QID) | ORAL | Status: DC | PRN
  Administered 2022-02-25 (×2): 650 mg via ORAL
  Filled 2022-02-25 (×2): qty 2

## 2022-02-25 MED ORDER — GABAPENTIN 400 MG PO CAPS
800.0000 mg | ORAL_CAPSULE | Freq: Three times a day (TID) | ORAL | Status: DC
  Administered 2022-02-25 – 2022-02-27 (×5): 800 mg via ORAL
  Filled 2022-02-25 (×5): qty 2

## 2022-02-25 MED ORDER — ACETAMINOPHEN 650 MG RE SUPP: 650.0000 mg | Freq: Four times a day (QID) | RECTAL | Status: DC | PRN

## 2022-02-25 MED ORDER — ENOXAPARIN SODIUM 40 MG/0.4ML IJ SOSY
40.0000 mg | PREFILLED_SYRINGE | INTRAMUSCULAR | Status: DC
  Administered 2022-02-25 – 2022-02-27 (×3): 40 mg via SUBCUTANEOUS
  Filled 2022-02-25 (×3): qty 0.4

## 2022-02-25 MED ORDER — OYSTER SHELL CALCIUM/D3 500-5 MG-MCG PO TABS
1.0000 | ORAL_TABLET | Freq: Every day | ORAL | Status: DC
  Administered 2022-02-25 – 2022-02-27 (×3): 1 via ORAL
  Filled 2022-02-25 (×3): qty 1

## 2022-02-25 MED ORDER — VITAMIN B-12 100 MCG PO TABS
250.0000 ug | ORAL_TABLET | Freq: Every day | ORAL | Status: DC
  Administered 2022-02-25 – 2022-02-27 (×3): 250 ug via ORAL
  Filled 2022-02-25 (×3): qty 3

## 2022-02-25 MED ORDER — ASPIRIN 81 MG PO TBEC
81.0000 mg | DELAYED_RELEASE_TABLET | Freq: Every day | ORAL | Status: DC
  Administered 2022-02-25 – 2022-02-27 (×3): 81 mg via ORAL
  Filled 2022-02-25 (×3): qty 1

## 2022-02-25 MED ORDER — MIDODRINE HCL 5 MG PO TABS
10.0000 mg | ORAL_TABLET | Freq: Once | ORAL | Status: AC
  Administered 2022-02-25: 10 mg via ORAL
  Filled 2022-02-25: qty 2

## 2022-02-25 MED ORDER — GUAIFENESIN ER 600 MG PO TB12
600.0000 mg | ORAL_TABLET | Freq: Two times a day (BID) | ORAL | Status: DC
  Administered 2022-02-25 – 2022-02-27 (×6): 600 mg via ORAL
  Filled 2022-02-25 (×6): qty 1

## 2022-02-25 MED ORDER — SODIUM CHLORIDE 0.9 % IV SOLN: INTRAVENOUS | Status: DC

## 2022-02-25 NOTE — Progress Notes (Signed)
   02/25/22 1848  Assess: MEWS Score  Temp (!) 102.3 F (39.1 C)  BP (!) 111/53  MAP (mmHg) 69  Pulse Rate 79  Resp 17  SpO2 95 %  O2 Device Room Air  Assess: MEWS Score  MEWS Temp 2  MEWS Systolic 0  MEWS Pulse 0  MEWS RR 0  MEWS LOC 0  MEWS Score 2  MEWS Score Color Yellow  Assess: if the MEWS score is Yellow or Red  Were vital signs taken at a resting state? Yes  Focused Assessment Change from prior assessment (see assessment flowsheet)  Does the patient meet 2 or more of the SIRS criteria? No  MEWS guidelines implemented *See Row Information* Yes  Treat  MEWS Interventions Administered prn meds/treatments (Given Tylenol)  Pain Scale 0-10  Pain Score 0  Take Vital Signs  Increase Vital Sign Frequency  Yellow: Q 2hr X 2 then Q 4hr X 2, if remains yellow, continue Q 4hrs  Escalate  MEWS: Escalate Yellow: discuss with charge nurse/RN and consider discussing with provider and RRT  Notify: Charge Nurse/RN  Name of Charge Nurse/RN Notified Jo, RN  Date Charge Nurse/RN Notified 02/25/22  Time Charge Nurse/RN Notified 1903  Notify: Provider  Provider Name/Title Foust, NP  Date Provider Notified 02/25/22  Time Provider Notified 1906  Method of Notification Page  Notification Reason Other (Comment) (yellow mews)  Provider response Other (Comment)  Document  Patient Outcome Other (Comment) (new admit)  Progress note created (see row info) Yes  Assess: SIRS CRITERIA  SIRS Temperature  1  SIRS Pulse 0  SIRS Respirations  0  SIRS WBC 1  SIRS Score Sum  2

## 2022-02-25 NOTE — Progress Notes (Signed)
   02/25/22 2256  Assess: MEWS Score  Temp 98.6 F (37 C)  BP (!) 98/45  MAP (mmHg) (!) 59  Pulse Rate 67  Resp 19  SpO2 95 %  Assess: MEWS Score  MEWS Temp 0  MEWS Systolic 1  MEWS Pulse 0  MEWS RR 0  MEWS LOC 0  MEWS Score 1  MEWS Score Color Green  Assess: if the MEWS score is Yellow or Red  Were vital signs taken at a resting state? Yes  Focused Assessment No change from prior assessment  Does the patient meet 2 or more of the SIRS criteria? No  MEWS guidelines implemented *See Row Information* No, previously yellow, continue vital signs every 4 hours  Assess: SIRS CRITERIA  SIRS Temperature  0  SIRS Pulse 0  SIRS Respirations  0  SIRS WBC 1  SIRS Score Sum  1

## 2022-02-25 NOTE — Assessment & Plan Note (Addendum)
-   The patient will be admitted to alcohol a medical telemetry bed. - The patient has subsequent generalized weakness and recurrent falls. - We will continue antibiotic therapy with IV Levaquin. - Sepsis manifested by leukocytosis as well as fever, tachycardia and tachypnea. - We will follow blood and urine cultures. - We will continue hydration with IV normal saline.

## 2022-02-25 NOTE — Consult Note (Signed)
PHARMACY -  BRIEF ANTIBIOTIC NOTE   Pharmacy has received consult(s) for merrem from an ED provider.  The patient's profile has been reviewed for ht/wt/allergies/indication/available labs.    One time order(s) placed for: Merrem 1g IV x1 in ED  Further antibiotics/pharmacy consults should be ordered by admitting physician if indicated.                       Thank you, Lorna Dibble 02/25/2022  12:09 AM

## 2022-02-25 NOTE — Assessment & Plan Note (Addendum)
--  2/2 sepsis and infection

## 2022-02-25 NOTE — ED Notes (Signed)
Hospitalist notified again of the patients trending soft Bps - no additional interventions ordered at this time.

## 2022-02-25 NOTE — ED Notes (Signed)
Dr. Mansy @ the bedside. 

## 2022-02-25 NOTE — Sepsis Progress Note (Signed)
Following per sepsis protocol   

## 2022-02-25 NOTE — ED Notes (Addendum)
Dr. Sidney Ace notified about the patients Soft BPS - manual  BP 82/56 - see MAR for intervention

## 2022-02-25 NOTE — Progress Notes (Signed)
The patient's blood pressure has been dropping overnight.  She received a bolus of 250 mL IV normal saline after having 2 L bolus initially by the ER physician.  She did not respond much and therefore was given another 500 mL after which blood pressure improved.  She dropped back again and is receiving a liter of IV normal saline bolus.  We also added midodrine and 10 mg p.o. 3 times daily and she will receive a second dose this morning.  She was seen and examined again.  She is mentating very well and denies any headache or dizziness or blurred vision.  No worsening symptoms.  If her blood pressure drops again she may need IV pressor therapy and ICU placement.

## 2022-02-25 NOTE — Assessment & Plan Note (Signed)
-   continue Neurontin and Trileptal.

## 2022-02-25 NOTE — Consult Note (Signed)
Pharmacy Antibiotic Note  Joanna Phillips is a 71 y.o. female admitted on 02/24/2022 with UTI.  Pharmacy has been consulted for levofloxacin dosing.  Plan: Initiate levofloxacin '500mg'$  IV q24h x7days  Height: '5\' 4"'$  (162.6 cm) Weight: 83 kg (182 lb 15.7 oz) IBW/kg (Calculated) : 54.7  Temp (24hrs), Avg:100.4 F (38 C), Min:99.9 F (37.7 C), Max:100.9 F (38.3 C)  Recent Labs  Lab 02/21/22 1248 02/22/22 0657 02/24/22 1414  WBC 9.0 6.3 15.4*  CREATININE 0.60 0.51 0.92  LATICACIDVEN  --   --  1.5    Estimated Creatinine Clearance: 58.4 mL/min (by C-G formula based on SCr of 0.92 mg/dL).    Allergies  Allergen Reactions   Amoxicillin Hives   Hydrocodone Itching   Sulfamethoxazole-Trimethoprim Nausea Only   Allopurinol Rash    Antimicrobials this admission: Merrem x1 in ED 10/09; then levofloxacin (10/10>>  Dose adjustments this admission: CTM and adjust PRN  Microbiology results: 10/09 BCx: pending 10/09 UCx: pending  10/09 Covid/flu PCR: negative  Thank you for allowing pharmacy to be a part of this patient's care.  Shanon Brow Kaydee Magel 02/25/2022 1:10 AM

## 2022-02-25 NOTE — Progress Notes (Signed)
Patient seen in the emergency room. Daughter at bedside. Was recently admitted and discharged on 7 October after she had come in with nausea vomiting and diarrhea for few days and hyponatremia. Patient came back again with generalized weakness confusion and unable to get around at home. Denies any nausea vomiting. Was found to be hypotensive earlier and responded to IV fluids. She does take midodrine at home. Complained of dysuria. Urine looks abnormal. Culture pending. Started on IV Levaquin. Blood culture so far negative. Overall appears stable. Will continue to monitor labs, vital signs and de-escalate antibiotic accordingly culture results. Physical therapy occupational therapy to see patient.

## 2022-02-25 NOTE — Progress Notes (Signed)
       CROSS COVER NOTE  NAME: Joanna Phillips MRN: [id number] DOB : 28-Aug-1950    Date of Service   02/25/2022   HPI/Events of Note   Notified of relative hypotension BP 98/45. Bps have been soft today and were initially fluid responsive this morning.  Interventions   Assessment/Plan: Midodrine NS Bolus     This document was prepared using Dragon voice recognition software and may include unintentional dictation errors.  Neomia Glass DNP, MBA, FNP-BC Nurse Practitioner [facility] Pager [phone]

## 2022-02-25 NOTE — Progress Notes (Addendum)
   02/25/22 2040  Assess: MEWS Score  Temp 99.3 F (37.4 C)  BP (!) 89/47  MAP (mmHg) (!) 60  Pulse Rate 73  Resp 18  SpO2 94 %  O2 Device Room Air  Assess: MEWS Score  MEWS Temp 0  MEWS Systolic 1  MEWS Pulse 0  MEWS RR 0  MEWS LOC 0  MEWS Score 1  MEWS Score Color Green  Assess: if the MEWS score is Yellow or Red  Were vital signs taken at a resting state? Yes  Focused Assessment No change from prior assessment  Does the patient meet 2 or more of the SIRS criteria? No  MEWS guidelines implemented *See Row Information* No, previously yellow, continue vital signs every 4 hours  Treat  Pain Scale 0-10  Pain Score 0  Assess: SIRS CRITERIA  SIRS Temperature  0  SIRS Pulse 0  SIRS Respirations  0  SIRS WBC 1  SIRS Score Sum  1

## 2022-02-25 NOTE — ED Notes (Signed)
Breakfast tray given. Pt denies any other needs. NAD noted. Call bell in reach.

## 2022-02-25 NOTE — Consult Note (Signed)
CODE SEPSIS - PHARMACY COMMUNICATION  **Broad Spectrum Antibiotics should be administered within 1 hour of Sepsis diagnosis**  Time Code Sepsis Called/Page Received: 1009 '@1151'$   Antibiotics Ordered: 1010 @ 0000  Time of 1st antibiotic administration: 0016  Additional action taken by pharmacy: N/A  If necessary, Name of Provider/Nurse Contacted: N/A  Lorna Dibble ,PharmD Clinical Pharmacist  02/25/2022  12:08 AM

## 2022-02-25 NOTE — Assessment & Plan Note (Signed)
-  also had diarrhea which had resolved.  Likely viral illness. --mucomyst neb to help sputum production.

## 2022-02-25 NOTE — ED Notes (Signed)
Pt resting calm and comfortably at this time. IVF bolus infusing as well as cont fluids. Pt's hypotensive, pt states she normally has a low BP and takes midodrine at home for low BP. Pt denies any dizziness at this time.   Pt given warm blanket. ABX infusing. Pt denies any further needs at this time. Call bell in reach.   Husband at bedside, husband given coffee.

## 2022-02-25 NOTE — H&P (Addendum)
[name]   PATIENT NAME: Joanna Phillips    MR#:  [id number]  DATE OF BIRTH:  18-Jul-1950  DATE OF ADMISSION:  02/24/2022  PRIMARY CARE PHYSICIAN: Kirk Ruths, MD   Patient is coming from: Home  REQUESTING/REFERRING PHYSICIAN: Lurline Hare, MD  CHIEF COMPLAINT:   Chief Complaint  Patient presents with   Extremity Weakness    HISTORY OF PRESENT ILLNESS:  Joanna Phillips is a 71 y.o. Caucasian female with medical history significant for GERD, DCIS, osteoarthritis and hyponatremia, who presented to the emergency room with acute onset of worsening generalized weakness with associated altered mental status with confusion.  She admitted to urinary frequency and urgency with associated dysuria and left groin pain.  She denies any hematuria but admitted to bilateral flank pain.  No nausea or vomiting or diarrhea.  She has been having cough productive of yellowish sputum over the last couple weeks.  No chest pain or palpitations.  No headache or dizziness or blurred vision.    ED Course: When she came to the ER, heart rate was 106 with temperature 100.9/38.3 with respiratory rate of 22 and initially normal blood pressure then hypertension with a BP of 81/35.  Labs revealed hyponatremia 129 and CO2 of 21 with blood sugar of 155 with otherwise unremarkable CMP.  CBC showed leukocytosis of 15.4 with neutrophilia and mild anemia.  Reports antigens and COVID-19 PCR came back negative.  UA was positive for UTI. EKG as reviewed by me : EKG showed sinus rhythm with a rate of 94 with prolonged PR interval and low voltage QRS with right axis deviation. Imaging: 2 view chest x-ray showed no acute cardiopulmonary disease.  The patient was given 1 g p.o. Tylenol and 1 L bolus of IV normal saline and 1 L of IV lactated Ringer as well as 1 g of IV meropenem.  She will be admitted to a medical telemetry bed for further evaluation and management PAST MEDICAL HISTORY:   Past Medical History:   Diagnosis Date   Arthritis    Breast cancer ([place]) 2017   DCIS   CLL (chronic lymphocytic leukemia) ([place]) 2011   DCIS (ductal carcinoma in situ) 03/11/2016   GERD (gastroesophageal reflux disease)    Hyponatremia    Personal history of radiation therapy 2017    PAST SURGICAL HISTORY:   Past Surgical History:  Procedure Laterality Date   BREAST BIOPSY Right 02/12/2016   DCIS   BREAST BIOPSY Right 12/27/2019   path pending, calcs, x marker   BREAST LUMPECTOMY Right 03/11/2016   DCIS neg margins   CHOLECYSTECTOMY  1996   COLONOSCOPY  2013   COLONOSCOPY WITH PROPOFOL N/A 04/19/2021   Procedure: COLONOSCOPY WITH PROPOFOL;  Surgeon: Annamaria Helling, DO;  Location: [facility] ENDOSCOPY;  Service: Gastroenterology;  Laterality: N/A;   squamous cell skin on scalp surgery Right     SOCIAL HISTORY:   Social History   Tobacco Use   Smoking status: Never   Smokeless tobacco: Never  Substance Use Topics   Alcohol use: No    FAMILY HISTORY:   Family History  Problem Relation Age of Onset   Cancer Father        lung   Diabetes Mother    Breast cancer Maternal Aunt 60   Breast cancer Maternal Aunt 64    DRUG ALLERGIES:   Allergies  Allergen Reactions   Amoxicillin Hives   Hydrocodone Itching   Sulfamethoxazole-Trimethoprim Nausea Only   Allopurinol  Rash    REVIEW OF SYSTEMS:   ROS As per history of present illness. All pertinent systems were reviewed above. Constitutional, HEENT, cardiovascular, respiratory, GI, GU, musculoskeletal, neuro, psychiatric, endocrine, integumentary and hematologic systems were reviewed and are otherwise negative/unremarkable except for positive findings mentioned above in the HPI.   MEDICATIONS AT HOME:   Prior to Admission medications   Medication Sig Start Date End Date Taking? Authorizing Provider  aspirin EC 81 MG tablet Take 81 mg by mouth daily.   Yes [provider]  calcium-vitamin D (OSCAL WITH D) 500-200 MG-UNIT  TABS tablet Take 1 tablet by mouth daily.   Yes [provider]  Carboxymethylcellulose Sodium (REFRESH CELLUVISC OP) Apply 1 drop to eye every 4 (four) hours as needed.   Yes [provider]  fluticasone (FLONASE) 50 MCG/ACT nasal spray Place into the nose. 09/10/20  Yes [provider]  gabapentin (NEURONTIN) 800 MG tablet Take 1.5 tablets (1,200 mg total) by mouth 2 (two) times daily. 01/11/19  Yes Mikey College, NP  guaiFENesin (MUCINEX) 600 MG 12 hr tablet Take 1 tablet (600 mg total) by mouth 2 (two) times daily for 5 days. 02/22/22 02/27/22 Yes Sharen Hones, MD  omeprazole (PRILOSEC) 20 MG capsule Take 1 capsule by mouth once daily 01/11/19  Yes Mikey College, NP  OXcarbazepine (TRILEPTAL) 150 MG tablet Take 300 mg by mouth 2 (two) times daily. 08/21/21  Yes [provider]  vitamin B-12 (CYANOCOBALAMIN) 250 MCG tablet Take 250 mcg by mouth daily.   Yes [provider]  acetaminophen (TYLENOL) 650 MG CR tablet Take by mouth.    [provider]      VITAL SIGNS:  Blood pressure (!) 101/56, pulse 82, temperature (!) 101.7 F (38.7 C), temperature source Oral, resp. rate 19, height '5\' 4"'$  (1.626 m), weight 83 kg, SpO2 97 %.  PHYSICAL EXAMINATION:  Physical Exam  GENERAL:  71 y.o.-year-old Caucasian female patient lying in the bed with no acute distress.  EYES: Pupils equal, round, reactive to light and accommodation. No scleral icterus. Extraocular muscles intact.  HEENT: Head atraumatic, normocephalic. Oropharynx and nasopharynx clear.  NECK:  Supple, no jugular venous distention. No thyroid enlargement, no tenderness.  LUNGS: Normal breath sounds bilaterally, no wheezing, rales,rhonchi or crepitation. No use of accessory muscles of respiration.  CARDIOVASCULAR: Regular rate and rhythm, S1, S2 normal. No murmurs, rubs, or gallops.  ABDOMEN: Soft, nondistended, with suprapubic tenderness as well as bilateral CVA tenderness.   There was no rebound tenderness or guarding or rigidity.  Bowel sounds present. No organomegaly or mass.  EXTREMITIES: No pedal edema, cyanosis, or clubbing.  NEUROLOGIC: Cranial nerves II through XII are intact. Muscle strength 5/5 in all extremities. Sensation intact. Gait not checked.  PSYCHIATRIC: The patient is alert and oriented x 3.  Normal affect and good eye contact. SKIN: No obvious rash, lesion, or ulcer.   LABORATORY PANEL:   CBC Recent Labs  Lab 02/24/22 1414  WBC 15.4*  HGB 11.8*  HCT 36.2  PLT 239   ------------------------------------------------------------------------------------------------------------------  Chemistries  Recent Labs  Lab 02/22/22 0648 02/22/22 0657 02/24/22 1414  NA  --    < > 129*  K  --    < > 4.2  CL  --    < > 99  CO2  --    < > 21*  GLUCOSE  --    < > 155*  BUN  --    < > 23  CREATININE  --    < >  0.92  CALCIUM  --    < > 8.8*  MG 2.1  --   --   AST  --   --  28  ALT  --   --  21  ALKPHOS  --   --  88  BILITOT  --   --  0.6   < > = values in this interval not displayed.   ------------------------------------------------------------------------------------------------------------------  Cardiac Enzymes No results for input(s): "TROPONINI" in the last 168 hours. ------------------------------------------------------------------------------------------------------------------  RADIOLOGY:  DG Chest 2 View  Result Date: 02/24/2022 CLINICAL DATA:  Fever, weakness. EXAM: CHEST - 2 VIEW COMPARISON:  Chest x-rays dated 02/21/2022 and 04/26/2012. FINDINGS: Heart size and mediastinal contours are within normal limits. Lungs are clear. No pleural effusion or pneumothorax is seen. Osseous structures about the chest are unremarkable. IMPRESSION: No active cardiopulmonary disease. No evidence of pneumonia or pulmonary edema. Electronically Signed   By: Franki Cabot M.D.   On: 02/24/2022 14:56      IMPRESSION AND PLAN:  Assessment and  Plan: * Sepsis due to gram-negative UTI [facility]) - The patient will be admitted to alcohol a medical telemetry bed. - The patient has subsequent generalized weakness and recurrent falls. - We will continue antibiotic therapy with IV Levaquin. - Sepsis manifested by leukocytosis as well as fever, tachycardia and tachypnea. - We will follow blood and urine cultures. - We will continue hydration with IV normal saline.  Acute metabolic encephalopathy - This is clearly secondary to #1. - We will monitor mental status with above management.  Postherpetic neuralgia - We will continue Neurontin and Trileptal.  Productive cough - She could be having acute bronchitis. - This should be covered with IV Levaquin. - Mucolytic therapy will be provided.   DVT prophylaxis: Lovenox.  Advanced Care Planning:  Code Status: full code.  Family Communication:  The plan of care was discussed in details with the patient (and family). I answered all questions. The patient agreed to proceed with the above mentioned plan. Further management will depend upon hospital course. Disposition Plan: Back to previous home environment Consults called: none.  All the records are reviewed and case discussed with ED provider.  Status is: Inpatient  At the time of the admission, it appears that the appropriate admission status for this patient is inpatient.  This is judged to be reasonable and necessary in order to provide the required intensity of service to ensure the patient's safety given the presenting symptoms, physical exam findings and initial radiographic and laboratory data in the context of comorbid conditions.  The patient requires inpatient status due to high intensity of service, high risk of further deterioration and high frequency of surveillance required.  I certify that at the time of admission, it is my clinical judgment that the patient will require inpatient hospital care extending more than 2 midnights.                             Dispo: The patient is from: Home              Anticipated d/c is to: Home              Patient currently is not medically stable to d/c.              Difficult to place patient: No  Christel Mormon M.D on 02/25/2022 at 4:48 AM  [facility]   From 7 PM-7 AM, contact  night-coverage [email or web address]  CC: Primary care physician; Kirk Ruths, MD

## 2022-02-26 DIAGNOSIS — A4150 Gram-negative sepsis, unspecified: Secondary | ICD-10-CM | POA: Diagnosis not present

## 2022-02-26 DIAGNOSIS — N390 Urinary tract infection, site not specified: Secondary | ICD-10-CM | POA: Diagnosis not present

## 2022-02-26 LAB — BASIC METABOLIC PANEL
Anion gap: 3 — ABNORMAL LOW (ref 5–15)
BUN: 16 mg/dL (ref 8–23)
CO2: 19 mmol/L — ABNORMAL LOW (ref 22–32)
Calcium: 7.8 mg/dL — ABNORMAL LOW (ref 8.9–10.3)
Chloride: 109 mmol/L (ref 98–111)
Creatinine, Ser: 0.76 mg/dL (ref 0.44–1.00)
GFR, Estimated: >60 mL/min (ref >60)
Glucose, Bld: 169 mg/dL — ABNORMAL HIGH (ref 70–99)
Potassium: 3.5 mmol/L (ref 3.5–5.1)
Sodium: 131 mmol/L — ABNORMAL LOW (ref 135–145)

## 2022-02-26 LAB — CBC
HCT: 28.1 % — ABNORMAL LOW (ref 36.0–46.0)
Hemoglobin: 9.1 g/dL — ABNORMAL LOW (ref 12.0–15.0)
MCH: 25.5 pg — ABNORMAL LOW (ref 26.0–34.0)
MCHC: 32.4 g/dL (ref 30.0–36.0)
MCV: 78.7 fL — ABNORMAL LOW (ref 80.0–100.0)
Platelets: 192 10*3/uL (ref 150–400)
RBC: 3.57 MIL/uL — ABNORMAL LOW (ref 3.87–5.11)
RDW: 15.7 % — ABNORMAL HIGH (ref 11.5–15.5)
WBC: 10.5 10*3/uL (ref 4.0–10.5)
nRBC: 0 % (ref 0.0–0.2)

## 2022-02-26 LAB — MAGNESIUM: Magnesium: 1.7 mg/dL (ref 1.7–2.4)

## 2022-02-26 MED ORDER — SODIUM CHLORIDE 0.9 % IV SOLN: INTRAVENOUS | Status: AC

## 2022-02-26 MED ORDER — IPRATROPIUM-ALBUTEROL 0.5-2.5 (3) MG/3ML IN SOLN
3.0000 mL | Freq: Two times a day (BID) | RESPIRATORY_TRACT | Status: DC
  Administered 2022-02-26 – 2022-02-27 (×3): 3 mL via RESPIRATORY_TRACT
  Filled 2022-02-26 (×3): qty 3

## 2022-02-26 MED ORDER — ACETYLCYSTEINE 20 % IN SOLN
4.0000 mL | Freq: Two times a day (BID) | RESPIRATORY_TRACT | Status: DC
  Administered 2022-02-26 – 2022-02-27 (×2): 4 mL via RESPIRATORY_TRACT
  Filled 2022-02-26 (×2): qty 4

## 2022-02-26 NOTE — Evaluation (Signed)
Physical Therapy Evaluation Patient Details Name: Joanna Phillips MRN: [id number] DOB: [birth year] Today's Date: 02/26/2022  History of Present Illness  Pt is a 71 y.o. Caucasian female with medical history significant for GERD, DCIS, osteoarthritis and hyponatremia, who presented to the emergency room with acute onset of worsening generalized weakness with associated altered mental status with confusion. workup for UTI.   Clinical Impression  Patient alert, agreeable to PT denied pain. Pt stated in the last two weeks she has had an increasing need for assist, utilizing RW, 2 falls. Lives with family, previously independent, performed the cleaning at home.  She performed bed mobility with supervision, use of bed rails. Fair sitting balance. Sit <> stand with RW and CGA from low surface, effortful for pt but able to complete without physical assistance. She was able to stand for several and then march prior to ambulation, and she walked ~20f with RW and CGA. Pt reported fatigue at end of activity, that she felt her muscles working.  Overall the patient demonstrated deficits (see "PT Problem List") that impede the patient's functional abilities, safety, and mobility and would benefit from skilled PT intervention. Recommendation at this time is HHPT with intermittent supervision/assistance to maximize function, mobility, and safety.   BP also assessed in sitting ,standing, and standing 3 minutes, see vital section for details but noted to be negative for orthostatic hypotension.        Recommendations for follow up therapy are one component of a multi-disciplinary discharge planning process, led by the attending physician.  Recommendations may be updated based on patient status, additional functional criteria and insurance authorization.  Follow Up Recommendations Home health PT      Assistance Recommended at Discharge Intermittent Supervision/Assistance  Patient can return home with the following   A little help with walking and/or transfers;A little help with bathing/dressing/bathroom;Assistance with cooking/housework;Assist for transportation;Help with stairs or ramp for entrance    Equipment Recommendations None recommended by PT  Recommendations for Other Services       Functional Status Assessment Patient has had a recent decline in their functional status and demonstrates the ability to make significant improvements in function in a reasonable and predictable amount of time.     Precautions / Restrictions Precautions Precautions: Fall Restrictions Weight Bearing Restrictions: No      Mobility  Bed Mobility Overal bed mobility: Needs Assistance Bed Mobility: Supine to Sit     Supine to sit: Supervision, HOB elevated     General bed mobility comments: use of bed rails    Transfers Overall transfer level: Needs assistance Equipment used: Rolling walker (2 wheels) Transfers: Sit to/from Stand Sit to Stand: Min guard           General transfer comment: effortful for pt but able to complete without physical assist    Ambulation/Gait Ambulation/Gait assistance: Min guard Gait Distance (Feet): 50 Feet Assistive device: Rolling walker (2 wheels)         General Gait Details: no LOB, decreased velocity and step length bilaerally  Stairs            Wheelchair Mobility    Modified Rankin (Stroke Patients Only)       Balance Overall balance assessment: Needs assistance Sitting-balance support: Feet supported Sitting balance-Leahy Scale: Fair     Standing balance support: Reliant on assistive device for balance Standing balance-Leahy Scale: Fair  Pertinent Vitals/Pain Pain Assessment Pain Assessment: No/denies pain    Home Living Family/patient expects to be discharged to:: Private residence Living Arrangements: Children;Spouse/significant other Available Help at Discharge: Family Type of Home:  House Home Access: Level entry       Home Layout: One level Home Equipment: Conservation officer, nature (2 wheels);Cane - single point;Other (comment) (bed rails) Additional Comments: 2-3 recent falls    Prior Function Prior Level of Function : Independent/Modified Independent             Mobility Comments: up until 2 weeks ago, independent. using RW since last ED visit (10/7).       Hand Dominance        Extremity/Trunk Assessment   Upper Extremity Assessment Upper Extremity Assessment: Defer to OT evaluation    Lower Extremity Assessment Lower Extremity Assessment: Generalized weakness (more challenged with LLE than RLE, but able to SLR and heel slide bilaterally)       Communication   Communication: No difficulties  Cognition Arousal/Alertness: Awake/alert Behavior During Therapy: WFL for tasks assessed/performed Overall Cognitive Status: Within Functional Limits for tasks assessed                                          General Comments      Exercises     Assessment/Plan    PT Assessment Patient needs continued PT services  PT Problem List Decreased strength;Decreased mobility;Decreased activity tolerance;Decreased balance;Decreased knowledge of use of DME       PT Treatment Interventions DME instruction;Therapeutic exercise;Gait training;Balance training;Stair training;Neuromuscular re-education;Functional mobility training;Therapeutic activities;Patient/family education    PT Goals (Current goals can be found in the Care Plan section)  Acute Rehab PT Goals Patient Stated Goal: to go home PT Goal Formulation: With patient Time For Goal Achievement: 03/12/22 Potential to Achieve Goals: Good    Frequency Min 2X/week     Co-evaluation               AM-PAC PT "6 Clicks" Mobility  Outcome Measure Help needed turning from your back to your side while in a flat bed without using bedrails?: A Little Help needed moving from lying on  your back to sitting on the side of a flat bed without using bedrails?: A Little Help needed moving to and from a bed to a chair (including a wheelchair)?: A Little Help needed standing up from a chair using your arms (e.g., wheelchair or bedside chair)?: A Little Help needed to walk in hospital room?: A Little Help needed climbing 3-5 steps with a railing? : A Little 6 Click Score: 18    End of Session Equipment Utilized During Treatment: Gait belt Activity Tolerance: Patient tolerated treatment well Patient left: in chair;with call bell/phone within reach;with family/visitor present Nurse Communication: Mobility status PT Visit Diagnosis: Other abnormalities of gait and mobility (R26.89);Difficulty in walking, not elsewhere classified (R26.2);Muscle weakness (generalized) (M62.81)    Time: 2694-8546 PT Time Calculation (min) (ACUTE ONLY): 29 min   Charges:   PT Evaluation $PT Eval Low Complexity: 1 Low PT Treatments $Therapeutic Activity: 23-37 mins        Lieutenant Diego PT, DPT 11:52 AM,02/26/22

## 2022-02-26 NOTE — Assessment & Plan Note (Signed)
PT/OT

## 2022-02-26 NOTE — Progress Notes (Signed)
   02/26/22 0044  Assess: MEWS Score  BP (!) 99/53  MAP (mmHg) 65  Pulse Rate 70  Assess: MEWS Score  MEWS Temp 0  MEWS Systolic 1  MEWS Pulse 0  MEWS RR 0  MEWS LOC 0  MEWS Score 1  MEWS Score Color Green  Assess: if the MEWS score is Yellow or Red  Were vital signs taken at a resting state? Yes  Focused Assessment No change from prior assessment  Does the patient meet 2 or more of the SIRS criteria? No  MEWS guidelines implemented *See Row Information* No, previously yellow, continue vital signs every 4 hours  Assess: SIRS CRITERIA  SIRS Temperature  0  SIRS Pulse 0  SIRS Respirations  0  SIRS WBC 1  SIRS Score Sum  1

## 2022-02-26 NOTE — Assessment & Plan Note (Signed)
--  likely due to volume depletion.  Improved with IVF. --cont MIVF'@75'$

## 2022-02-26 NOTE — Evaluation (Signed)
Occupational Therapy Evaluation Patient Details Name: Joanna Phillips MRN: [id number] DOB: 1950-06-17 Today's Date: 02/26/2022   History of Present Illness Pt is a 71 y.o. Caucasian female with medical history significant for GERD, DCIS, osteoarthritis and hyponatremia, who presented to the emergency room with acute onset of worsening generalized weakness with associated altered mental status with confusion. workup for UTI.   Clinical Impression   Patient presenting with decreased independence in self-care, functional mobility, safety, and endurance. Patient reports she lives at home with her husband and children who are able to provide 24/7 assistance. Patient has a walk-in shower, comfort height toilet, SPC, and RW. At baseline patient reports she's independent with all ADL and mobility tasks, but recently had to use RW after fall. Patient currently functioning at supervision for transfer sit <>stand from recliner. Patient was able to ambulate ~87ft with min guard and no LOB. Patient reporting she left a very fatigue after. Patient left in room with call bell in reach, all needs met, and family present in room.   Patient will benefit from acute OT to increase overall independence in the areas of ADLs, functional mobility, in order to safely discharge home.       Recommendations for follow up therapy are one component of a multi-disciplinary discharge planning process, led by the attending physician.  Recommendations may be updated based on patient status, additional functional criteria and insurance authorization.   Follow Up Recommendations  Home health OT    Assistance Recommended at Discharge Intermittent Supervision/Assistance  Patient can return home with the following Assistance with cooking/housework;A little help with bathing/dressing/bathroom;A little help with walking and/or transfers    Functional Status Assessment  Patient has had a recent decline in their functional status and  demonstrates the ability to make significant improvements in function in a reasonable and predictable amount of time.  Equipment Recommendations  None recommended by OT    Recommendations for Other Services       Precautions / Restrictions Precautions Precautions: Fall      Mobility Bed Mobility               General bed mobility comments: Patient in recliner upon arrival.    Transfers Overall transfer level: Needs assistance Equipment used: Rolling walker (2 wheels) Transfers: Sit to/from Stand Sit to Stand: Supervision, Min guard           General transfer comment: effortful for pt but able to complete without physical assist      Balance Overall balance assessment: Needs assistance Sitting-balance support: Feet supported, No upper extremity supported Sitting balance-Leahy Scale: Good     Standing balance support: Reliant on assistive device for balance, Bilateral upper extremity supported, During functional activity Standing balance-Leahy Scale: Fair                             ADL either performed or assessed with clinical judgement   ADL                                         General ADL Comments: Patient declined all ADL tasks.     Vision Patient Visual Report: No change from baseline              Pertinent Vitals/Pain Pain Assessment Pain Assessment: No/denies pain        Extremity/Trunk Assessment Upper  Extremity Assessment Upper Extremity Assessment: Overall WFL for tasks assessed   Lower Extremity Assessment Lower Extremity Assessment: Generalized weakness       Communication Communication Communication: No difficulties   Cognition Arousal/Alertness: Awake/alert Behavior During Therapy: WFL for tasks assessed/performed Overall Cognitive Status: Within Functional Limits for tasks assessed                                                  Home Living Family/patient expects to  be discharged to:: Private residence Living Arrangements: Children;Spouse/significant other Available Help at Discharge: Family Type of Home: House Home Access: Level entry     Home Layout: One level     Bathroom Shower/Tub: Occupational psychologist: Handicapped height     Home Equipment: Conservation officer, nature (2 wheels);Cane - single point   Additional Comments: 2-3 recent falls      Prior Functioning/Environment Prior Level of Function : Independent/Modified Independent             Mobility Comments: up until 2 weeks ago, independent. using RW since last ED visit (10/7).          OT Problem List: Decreased strength;Decreased safety awareness;Decreased activity tolerance      OT Treatment/Interventions: Self-care/ADL training;Therapeutic exercise;Energy conservation;Therapeutic activities;Patient/family education    OT Goals(Current goals can be found in the care plan section) Acute Rehab OT Goals Patient Stated Goal: to return home. OT Goal Formulation: With patient Time For Goal Achievement: 03/12/22 Potential to Achieve Goals: Good ADL Goals Pt Will Perform Lower Body Bathing: with modified independence Pt Will Perform Lower Body Dressing: with modified independence Pt Will Transfer to Toilet: with modified independence Pt Will Perform Toileting - Clothing Manipulation and hygiene: with modified independence  OT Frequency: Min 2X/week       AM-PAC OT "6 Clicks" Daily Activity     Outcome Measure Help from another person eating meals?: None Help from another person taking care of personal grooming?: None Help from another person toileting, which includes using toliet, bedpan, or urinal?: A Little Help from another person bathing (including washing, rinsing, drying)?: None Help from another person to put on and taking off regular upper body clothing?: None Help from another person to put on and taking off regular lower body clothing?: A Little 6 Click  Score: 22   End of Session Equipment Utilized During Treatment: Rolling walker (2 wheels) Nurse Communication: Mobility status  Activity Tolerance: Patient limited by fatigue;Patient tolerated treatment well Patient left: in chair;with call bell/phone within reach;with family/visitor present  OT Visit Diagnosis: Unsteadiness on feet (R26.81);Repeated falls (R29.6);Muscle weakness (generalized) (M62.81)                Time: 2409-7353 OT Time Calculation (min): 18 min Charges:  OT General Charges $OT Visit: 1 Visit OT Evaluation $OT Eval Low Complexity: 1 Low OT Treatments $Therapeutic Activity: 8-22 mins    Franshesca Chipman, OTS 02/26/2022, 3:23 PM

## 2022-02-26 NOTE — Progress Notes (Signed)
  PROGRESS NOTE    Joanna Phillips  LHT:[id number] DOB: 01-Nov-1950 DOA: 02/24/2022 PCP: Kirk Ruths, MD  108A/108A-AA  LOS: 1 day   Brief hospital course:   Assessment & Plan: Joanna Phillips is a 71 y.o. Caucasian female with medical history significant for GERD, DCIS, osteoarthritis and hyponatremia, who presented to the emergency room with acute onset of worsening generalized weakness with associated altered mental status with confusion.  She admitted to urinary frequency and urgency with associated dysuria and left groin pain.   * Sepsis due to gram-negative UTI ([place]) - Sepsis manifested by leukocytosis as well as fever, tachycardia and tachypnea. --did have dysuria. - started on IV levaquin  Plan: --cont Levaquin pending urine cx  Acute metabolic encephalopathy --2/2 sepsis and infection  Postherpetic neuralgia - continue Neurontin and Trileptal.  Productive cough -also had diarrhea which had resolved.  Likely viral illness. --mucomyst neb to help sputum production.  Generalized weakness --PT/OT  Hyponatremia --likely due to volume depletion.  Improved with IVF. --cont MIVF'@75'$    DVT prophylaxis: Lovenox SQ Code Status: Full code  Family Communication: daughter updated at bedside today Level of care: Telemetry Medical Dispo:   The patient is from: home Anticipated d/c is to: home Anticipated d/c date is: tomorrow   Subjective and Interval History:  Cough has not improved.  Felt like she couldn't bring up sputum.     Objective: Vitals:   02/26/22 1149 02/26/22 1523 02/26/22 1555 02/26/22 2042  BP: (!) 121/53  (!) 105/55 127/64  Pulse: 75  84 78  Resp: 18     Temp: 98.8 F (37.1 C)  100.2 F (37.9 C) 99.9 F (37.7 C)  TempSrc: Oral  Oral   SpO2: 99% 98% 98% 98%  Weight:      Height:        Intake/Output Summary (Last 24 hours) at 02/26/2022 2102 Last data filed at 02/26/2022 2031 Gross per 24 hour  Intake 4235.93 ml  Output 900 ml  Net  3335.93 ml   Filed Weights   02/24/22 1407  Weight: 83 kg    Examination:   Constitutional: NAD, AAOx3 HEENT: conjunctivae and lids normal, EOMI CV: No cyanosis.   RESP: normal respiratory effort, clear lung sounds, on RA Neuro: II - XII grossly intact.   Psych: Normal mood and affect.  Appropriate judgement and reason   Data Reviewed: I have personally reviewed labs and imaging studies  Time spent: 50 minutes  Enzo Bi, MD [facility] If 7PM-7AM, please contact night-coverage 02/26/2022, 9:02 PM

## 2022-02-27 LAB — BLOOD CULTURE ID PANEL (REFLEXED) - BCID2

## 2022-02-27 LAB — CBC
HCT: 26.3 % — ABNORMAL LOW (ref 36.0–46.0)
Hemoglobin: 8.7 g/dL — ABNORMAL LOW (ref 12.0–15.0)
MCH: 26.1 pg (ref 26.0–34.0)
MCHC: 33.1 g/dL (ref 30.0–36.0)
MCV: 79 fL — ABNORMAL LOW (ref 80.0–100.0)
Platelets: 184 10*3/uL (ref 150–400)
RBC: 3.33 MIL/uL — ABNORMAL LOW (ref 3.87–5.11)
RDW: 15.5 % (ref 11.5–15.5)
WBC: 6.3 10*3/uL (ref 4.0–10.5)
nRBC: 0 % (ref 0.0–0.2)

## 2022-02-27 LAB — URINE CULTURE: Culture: >=100000 — AB

## 2022-02-27 LAB — BASIC METABOLIC PANEL
Anion gap: 6 (ref 5–15)
BUN: 14 mg/dL (ref 8–23)
CO2: 19 mmol/L — ABNORMAL LOW (ref 22–32)
Calcium: 8 mg/dL — ABNORMAL LOW (ref 8.9–10.3)
Chloride: 109 mmol/L (ref 98–111)
Creatinine, Ser: 0.71 mg/dL (ref 0.44–1.00)
GFR, Estimated: >60 mL/min (ref >60)
Glucose, Bld: 133 mg/dL — ABNORMAL HIGH (ref 70–99)
Potassium: 3.8 mmol/L (ref 3.5–5.1)
Sodium: 134 mmol/L — ABNORMAL LOW (ref 135–145)

## 2022-02-27 LAB — MAGNESIUM: Magnesium: 1.8 mg/dL (ref 1.7–2.4)

## 2022-02-27 MED ORDER — GABAPENTIN 800 MG PO TABS: 800.0000 mg | ORAL_TABLET | Freq: Three times a day (TID) | ORAL | 1 refills | Status: AC

## 2022-02-27 NOTE — Progress Notes (Signed)
Mobility Specialist - Progress Note   02/27/22 0936  Mobility  Activity Stood at bedside;Ambulated with assistance in hallway;Dangled on edge of bed  Level of Assistance Standby assist, set-up cues, supervision of patient - no hands on  Assistive Device Front wheel walker  Distance Ambulated (ft) 160 ft  Activity Response Tolerated well  $Mobility charge 1 Mobility   Pt EOB on RA upon arrival. Pt STS and ambulates 1 lap around NS SBA. Pt has one minor LOB corrected on their own. Pt returns to recliner with needs in reach and NT in room.   Joanna Phillips  Mobility Specialist  02/27/22 9:38 AM

## 2022-02-27 NOTE — Discharge Summary (Signed)
Physician Discharge Summary   Joanna Phillips  female DOB: [birth year]  AOZ:[id number]  PCP: Kirk Ruths, MD  Admit date: 02/24/2022 Discharge date: 02/27/2022  Admitted From: home Disposition:  home CODE STATUS: Full code   Hospital Course:  For full details, please see H&P, progress notes, consult notes and ancillary notes.  Briefly,  Joanna Phillips is a 71 y.o. Caucasian female with medical history significant for DCIS, osteoarthritis and hyponatremia, who presented to the emergency room with acute onset of worsening generalized weakness with associated altered mental status with confusion.  She admitted to urinary frequency and urgency with associated dysuria and left groin pain.   * Sepsis due to E coli UTI ([place]) - Sepsis manifested by leukocytosis as well as fever, tachycardia and tachypnea. --did have dysuria. --due to allergy to amoxicillin, pt received meropenem x1, IV levaquin x3 days which completed the treat for UTI.  Addendum:  E coli bacteremia After pt was discharged, 1 aerobic blood cx returned pos for E coli, pan sensitive.  After discussion with ID pharm, oral Cipro x 6 days was called in for pt, which will complete a 10-day abx course to treat E coli bacteremia.  Pharm called and notified pt and family of this new result and new Rx.   Acute metabolic encephalopathy, resolved --2/2 sepsis and infection   Postherpetic neuralgia - continue Neurontin and Trileptal.   Productive cough -also had diarrhea which had resolved.  Likely viral illness. --given mucomyst neb to help sputum clearance.     Generalized weakness --PT/OT   Hyponatremia --likely due to volume depletion.  Improved with IVF.   Discharge Diagnoses:  Principal Problem:   Sepsis due to gram-negative UTI [facility]) Active Problems:   Acute metabolic encephalopathy   Postherpetic neuralgia   Productive cough   Hyponatremia   Generalized weakness   30 Day Unplanned Readmission Risk  Score    Flowsheet Row ED to Hosp-Admission (Current) from 02/24/2022 in [facility]  30 Day Unplanned Readmission Risk Score (%) 20.68 Filed at 02/27/2022 0801       This score is the patient's risk of an unplanned readmission within 30 days of being discharged (0 -100%). The score is based on dignosis, age, lab data, medications, orders, and past utilization.   Low:  0-14.9   Medium: 15-21.9   High: 22-29.9   Extreme: 30 and above         Discharge Instructions:  Allergies as of 02/27/2022       Reactions   Amoxicillin Hives   Hydrocodone Itching   Sulfamethoxazole-trimethoprim Nausea Only   Allopurinol Rash        Medication List     TAKE these medications    acetaminophen 650 MG CR tablet Commonly known as: TYLENOL Take by mouth.   aspirin EC 81 MG tablet Take 81 mg by mouth daily.   calcium-vitamin D 500-200 MG-UNIT Tabs tablet Commonly known as: OSCAL WITH D Take 1 tablet by mouth daily.   fluticasone 50 MCG/ACT nasal spray Commonly known as: FLONASE Place into the nose.   gabapentin 800 MG tablet Commonly known as: NEURONTIN Take 1 tablet (800 mg total) by mouth 3 (three) times daily. Home med. What changed:  how much to take when to take this additional instructions   guaiFENesin 600 MG 12 hr tablet Commonly known as: MUCINEX Take 1 tablet (600 mg total) by mouth 2 (two) times daily for 5 days.   omeprazole  20 MG capsule Commonly known as: PRILOSEC Take 1 capsule by mouth once daily   OXcarbazepine 150 MG tablet Commonly known as: TRILEPTAL Take 300 mg by mouth 2 (two) times daily.   REFRESH CELLUVISC OP Apply 1 drop to eye every 4 (four) hours as needed.   vitamin B-12 250 MCG tablet Commonly known as: CYANOCOBALAMIN Take 250 mcg by mouth daily.         Follow-up Information     Kirk Ruths, MD Follow up in 1 week(s).   Specialty: Internal Medicine Contact information: [place] [address] [phone]                 Allergies  Allergen Reactions   Amoxicillin Hives   Hydrocodone Itching   Sulfamethoxazole-Trimethoprim Nausea Only   Allopurinol Rash     The results of significant diagnostics from this hospitalization (including imaging, microbiology, ancillary and laboratory) are listed below for reference.   Consultations:   Procedures/Studies: DG Chest 2 View  Result Date: 02/24/2022 CLINICAL DATA:  Fever, weakness. EXAM: CHEST - 2 VIEW COMPARISON:  Chest x-rays dated 02/21/2022 and 04/26/2012. FINDINGS: Heart size and mediastinal contours are within normal limits. Lungs are clear. No pleural effusion or pneumothorax is seen. Osseous structures about the chest are unremarkable. IMPRESSION: No active cardiopulmonary disease. No evidence of pneumonia or pulmonary edema. Electronically Signed   By: Franki Cabot M.D.   On: 02/24/2022 14:56   CT ABDOMEN PELVIS W CONTRAST  Result Date: 02/21/2022 CLINICAL DATA:  Abdominal pain EXAM: CT ABDOMEN AND PELVIS WITH CONTRAST TECHNIQUE: Multidetector CT imaging of the abdomen and pelvis was performed using the standard protocol following bolus administration of intravenous contrast. RADIATION DOSE REDUCTION: This exam was performed according to the departmental dose-optimization program which includes automated exposure control, adjustment of the mA and/or kV according to patient size and/or use of iterative reconstruction technique. CONTRAST:  110m OMNIPAQUE IOHEXOL 300 MG/ML  SOLN COMPARISON:  05/27/2010 FINDINGS: Lower chest: Breathing motion limits evaluation of lower lung fields. As far as seen, there is no focal consolidation in the lower lung fields. There is no pleural effusion. Dense calcification is seen in mitral annulus. Scattered coronary artery calcifications are seen. Hepatobiliary: Liver measures 19.9 cm in length. There is fatty infiltration.  Surgical clips are seen in gallbladder fossa. Pancreas: No focal abnormalities are seen. Evaluation of the tail is technically difficult due to lymphadenopathy in splenic hilum. Spleen: Spleen measures 13.9 cm in maximum diameter. Adrenals/Urinary Tract: Adrenals are unremarkable. There is no hydronephrosis. There are no renal or ureteral stones. Urinary bladder is unremarkable. Stomach/Bowel: Small hiatal hernia is seen. Stomach is not distended. Small bowel loops are not dilated. Appendix is not seen. Multiple diverticula are seen in colon without signs of focal diverticulitis. Vascular/Lymphatic: Atherosclerotic plaques and calcifications are seen in aorta and its major branches. There is 2.2 cm ectasia in the infrarenal aorta. No follow-up imaging is recommended. There are enlarged lymph nodes in the retrocrural region, retroperitoneum, along the lesser curvature aspect of the stomach, poorly bodies and along the medial aspect of spleen. Largest of the nodes is noted in the right iliac chain measuring 3.6 cm in short axis measurement. There are other bulky lymph nodes in retroperitoneum. There is interval increase in size and number of lymph nodes in abdomen and pelvis. Reproductive: Calcified uterine fibroid is seen. Other: There is no ascites or pneumoperitoneum. Musculoskeletal: Degenerative changes are noted in thoracic  and lumbar spine, more severe at L2-L3, L3-L4 and L5-S1 levels. There is mild retrolisthesis at L2-L3 level. IMPRESSION: There are abnormally enlarged lymph nodes in retrocrural region, retroperitoneum, mesentery, adjacent to porta hepatis and medial to the spleen. Findings suggest active inflammatory or neoplastic process. PET-CT and tissue sampling as warranted should be considered. Enlarged spleen. Enlarged fatty liver. Small hiatal hernia. Diverticulosis of colon without signs of focal diverticulitis. Lumbar spondylosis. Other findings as described in the body of the report.  Electronically Signed   By: Elmer Picker M.D.   On: 02/21/2022 16:16   CT HEAD WO CONTRAST (5MM)  Result Date: 02/21/2022 CLINICAL DATA:  Headache, sudden, severe EXAM: CT HEAD WITHOUT CONTRAST TECHNIQUE: Contiguous axial images were obtained from the base of the skull through the vertex without intravenous contrast. RADIATION DOSE REDUCTION: This exam was performed according to the departmental dose-optimization program which includes automated exposure control, adjustment of the mA and/or kV according to patient size and/or use of iterative reconstruction technique. COMPARISON:  02/21/2016 FINDINGS: Brain: No evidence of acute infarction, hemorrhage, hydrocephalus, extra-axial collection or mass lesion/mass effect. Scattered low-density changes within the periventricular and subcortical white matter compatible with chronic microvascular ischemic change. Mild diffuse cerebral volume loss. Vascular: Atherosclerotic calcifications involving the large vessels of the skull base. No unexpected hyperdense vessel. Skull: Postsurgical changes to the right suboccipital calvarium. Negative for calvarial fracture. Sinuses/Orbits: Mild mucosal thickening within the ethmoid air cells. Otherwise clear. Other: None. IMPRESSION: 1. No acute intracranial findings. 2. Chronic microvascular ischemic change and cerebral volume loss. Electronically Signed   By: Davina Poke D.O.   On: 02/21/2022 15:59   DG Chest 2 View  Result Date: 02/21/2022 CLINICAL DATA:  Hyponatremia.  Evaluate for thoracic mass. EXAM: CHEST - 2 VIEW COMPARISON:  Radiographs 04/26/2012.  CT 05/27/2010. FINDINGS: The heart size and mediastinal contours are normal. The lungs are clear. There is no pleural effusion or pneumothorax. No acute osseous findings are identified. There are mild degenerative changes in the spine. Cholecystectomy clips and vascular calcifications are noted. IMPRESSION: No active cardiopulmonary process. No evidence of  thoracic mass. Electronically Signed   By: Richardean Sale M.D.   On: 02/21/2022 15:08      Labs: BNP (last 3 results) No results for input(s): "BNP" in the last 8760 hours. Basic Metabolic Panel: Recent Labs  Lab 02/22/22 0648 02/22/22 0657 02/24/22 1414 02/25/22 0502 02/26/22 0927 02/27/22 0441  NA  --  129* 129* 132* 131* 134*  K  --  4.0 4.2 3.3* 3.5 3.8  CL  --  97* 99 104 109 109  CO2  --  27 21* 21* 19* 19*  GLUCOSE  --  108* 155* 135* 169* 133*  BUN  --  13 23 25* 16 14  CREATININE  --  0.51 0.92 0.90 0.76 0.71  CALCIUM  --  8.5* 8.8* 7.6* 7.8* 8.0*  MG 2.1  --   --   --  1.7 1.8   Liver Function Tests: Recent Labs  Lab 02/21/22 1248 02/24/22 1414  AST 26 28  ALT 19 21  ALKPHOS 98 88  BILITOT 0.7 0.6  PROT 6.6 6.9  ALBUMIN 3.9 4.2   Recent Labs  Lab 02/21/22 1248  LIPASE 38   No results for input(s): "AMMONIA" in the last 168 hours. CBC: Recent Labs  Lab 02/22/22 0657 02/24/22 1414 02/25/22 0502 02/26/22 0927 02/27/22 0441  WBC 6.3 15.4* 11.8* 10.5 6.3  NEUTROABS  --  7.1  --   --   --  HGB 11.6* 11.8* 9.2* 9.1* 8.7*  HCT 35.4* 36.2 28.2* 28.1* 26.3*  MCV 77.0* 78.4* 79.0* 78.7* 79.0*  PLT 220 239 187 192 184   Cardiac Enzymes: No results for input(s): "CKTOTAL", "CKMB", "CKMBINDEX", "TROPONINI" in the last 168 hours. BNP: Invalid input(s): "POCBNP" CBG: Recent Labs  Lab 02/21/22 2043 02/22/22 0743  GLUCAP 116* 103*   D-Dimer No results for input(s): "DDIMER" in the last 72 hours. Hgb A1c No results for input(s): "HGBA1C" in the last 72 hours. Lipid Profile No results for input(s): "CHOL", "HDL", "LDLCALC", "TRIG", "CHOLHDL", "LDLDIRECT" in the last 72 hours. Thyroid function studies No results for input(s): "TSH", "T4TOTAL", "T3FREE", "THYROIDAB" in the last 72 hours.  Invalid input(s): "FREET3" Anemia work up No results for input(s): "VITAMINB12", "FOLATE", "FERRITIN", "TIBC", "IRON", "RETICCTPCT" in the last 72  hours. Urinalysis    Component Value Date/Time   COLORURINE YELLOW (A) 02/24/2022 1428   APPEARANCEUR TURBID (A) 02/24/2022 1428   LABSPEC 1.016 02/24/2022 1428   PHURINE 5.0 02/24/2022 1428   GLUCOSEU NEGATIVE 02/24/2022 1428   HGBUR MODERATE (A) 02/24/2022 1428   BILIRUBINUR NEGATIVE 02/24/2022 1428   BILIRUBINUR neg 03/17/2016 1103   KETONESUR NEGATIVE 02/24/2022 1428   PROTEINUR 100 (A) 02/24/2022 1428   UROBILINOGEN 0.2 03/17/2016 1103   NITRITE NEGATIVE 02/24/2022 1428   LEUKOCYTESUR LARGE (A) 02/24/2022 1428   Sepsis Labs Recent Labs  Lab 02/24/22 1414 02/25/22 0502 02/26/22 0927 02/27/22 0441  WBC 15.4* 11.8* 10.5 6.3   Microbiology Recent Results (from the past 240 hour(s))  Resp Panel by RT-PCR (Flu A&B, Covid) Anterior Nasal Swab     Status: None   Collection Time: 02/21/22  5:28 PM   Specimen: Anterior Nasal Swab  Result Value Ref Range Status   SARS Coronavirus 2 by RT PCR NEGATIVE NEGATIVE Final    Comment: (NOTE) SARS-CoV-2 target nucleic acids are NOT DETECTED.  The SARS-CoV-2 RNA is generally detectable in upper respiratory specimens during the acute phase of infection. The lowest concentration of SARS-CoV-2 viral copies this assay can detect is 138 copies/mL. A negative result does not preclude SARS-Cov-2 infection and should not be used as the sole basis for treatment or other patient management decisions. A negative result may occur with  improper specimen collection/handling, submission of specimen other than nasopharyngeal swab, presence of viral mutation(s) within the areas targeted by this assay, and inadequate number of viral copies(<138 copies/mL). A negative result must be combined with clinical observations, patient history, and epidemiological information. The expected result is Negative.  Fact Sheet for Patients:  EntrepreneurPulse.com.au  Fact Sheet for Healthcare Providers:   IncredibleEmployment.be  This test is no t yet approved or cleared by the [place] FDA and  has been authorized for detection and/or diagnosis of SARS-CoV-2 by FDA under an Emergency Use Authorization (EUA). This EUA will remain  in effect (meaning this test can be used) for the duration of the COVID-19 declaration under Section 564(b)(1) of the Act, 21 U.S.C.section 360bbb-3(b)(1), unless the authorization is terminated  or revoked sooner.       Influenza A by PCR NEGATIVE NEGATIVE Final   Influenza B by PCR NEGATIVE NEGATIVE Final    Comment: (NOTE) The Xpert Xpress SARS-CoV-2/FLU/RSV plus assay is intended as an aid in the diagnosis of influenza from Nasopharyngeal swab specimens and should not be used as a sole basis for treatment. Nasal washings and aspirates are unacceptable for Xpert Xpress SARS-CoV-2/FLU/RSV testing.  Fact Sheet for Patients: EntrepreneurPulse.com.au  Fact Sheet for  Healthcare Providers: IncredibleEmployment.be  This test is not yet approved or cleared by the [place] and has been authorized for detection and/or diagnosis of SARS-CoV-2 by FDA under an Emergency Use Authorization (EUA). This EUA will remain in effect (meaning this test can be used) for the duration of the COVID-19 declaration under Section 564(b)(1) of the Act, 21 U.S.C. section 360bbb-3(b)(1), unless the authorization is terminated or revoked.  Performed at [facility], [address] [place], [place] [address]   Blood culture (routine x 2)     Status: None (Preliminary result)   Collection Time: 02/24/22  2:14 PM   Specimen: BLOOD  Result Value Ref Range Status   Specimen Description BLOOD BLOOD LEFT HAND  Final   Special Requests   Final    BOTTLES DRAWN AEROBIC AND ANAEROBIC Blood Culture adequate volume   Culture   Final    NO GROWTH 3 DAYS Performed at [facility], [address] [place], [place] [address]    Report Status PENDING  Incomplete  Urine Culture     Status: Abnormal   Collection Time: 02/24/22  2:28 PM   Specimen: Urine, Random  Result Value Ref Range Status   Specimen Description   Final    URINE, RANDOM Performed at [facility], [address] [place], [place] [address]    Special Requests   Final    NONE Performed at [facility], [place], [place] [address]    Culture >=100,000 COLONIES/mL ESCHERICHIA COLI (A)  Final   Report Status 02/27/2022 FINAL  Final   Organism ID, Bacteria ESCHERICHIA COLI (A)  Final      Susceptibility   Escherichia coli - MIC*    AMPICILLIN 4 SENSITIVE Sensitive     CEFAZOLIN <=4 SENSITIVE Sensitive     CEFEPIME <=0.12 SENSITIVE Sensitive     CEFTRIAXONE <=0.25 SENSITIVE Sensitive     CIPROFLOXACIN <=0.25 SENSITIVE Sensitive     GENTAMICIN <=1 SENSITIVE Sensitive     IMIPENEM <=0.25 SENSITIVE Sensitive     NITROFURANTOIN 64 INTERMEDIATE Intermediate     TRIMETH/SULFA <=20 SENSITIVE Sensitive     AMPICILLIN/SULBACTAM <=2 SENSITIVE Sensitive     PIP/TAZO <=4 SENSITIVE Sensitive     * >=100,000 COLONIES/mL ESCHERICHIA COLI  Resp Panel by RT-PCR (Flu A&B, Covid) Anterior Nasal Swab     Status: None   Collection Time: 02/25/22 12:02 AM   Specimen: Anterior Nasal Swab  Result Value Ref Range Status   SARS Coronavirus 2 by RT PCR NEGATIVE NEGATIVE Final    Comment: (NOTE) SARS-CoV-2 target nucleic acids are NOT DETECTED.  The SARS-CoV-2 RNA is generally detectable in upper respiratory specimens during the acute phase of infection. The lowest concentration of SARS-CoV-2 viral copies this assay can detect is 138 copies/mL. A negative result does not preclude SARS-Cov-2 infection and should not be used as the sole basis for treatment or other patient management decisions. A negative result may occur with  improper specimen collection/handling, submission of specimen other than  nasopharyngeal swab, presence of viral mutation(s) within the areas targeted by this assay, and inadequate number of viral copies(<138 copies/mL). A negative result must be combined with clinical observations, patient history, and epidemiological information. The expected result is Negative.  Fact Sheet for Patients:  EntrepreneurPulse.com.au  Fact Sheet for Healthcare Providers:  IncredibleEmployment.be  This test is no t yet approved or cleared by the [place] FDA and  has been authorized for detection and/or  diagnosis of SARS-CoV-2 by FDA under an Emergency Use Authorization (EUA). This EUA will remain  in effect (meaning this test can be used) for the duration of the COVID-19 declaration under Section 564(b)(1) of the Act, 21 U.S.C.section 360bbb-3(b)(1), unless the authorization is terminated  or revoked sooner.       Influenza A by PCR NEGATIVE NEGATIVE Final   Influenza B by PCR NEGATIVE NEGATIVE Final    Comment: (NOTE) The Xpert Xpress SARS-CoV-2/FLU/RSV plus assay is intended as an aid in the diagnosis of influenza from Nasopharyngeal swab specimens and should not be used as a sole basis for treatment. Nasal washings and aspirates are unacceptable for Xpert Xpress SARS-CoV-2/FLU/RSV testing.  Fact Sheet for Patients: EntrepreneurPulse.com.au  Fact Sheet for Healthcare Providers: IncredibleEmployment.be  This test is not yet approved or cleared by the [place] FDA and has been authorized for detection and/or diagnosis of SARS-CoV-2 by FDA under an Emergency Use Authorization (EUA). This EUA will remain in effect (meaning this test can be used) for the duration of the COVID-19 declaration under Section 564(b)(1) of the Act, 21 U.S.C. section 360bbb-3(b)(1), unless the authorization is terminated or revoked.  Performed at [facility], [address] [place], [place]  [address]   Blood culture (routine x 2)     Status: None (Preliminary result)   Collection Time: 02/25/22  9:57 PM   Specimen: BLOOD  Result Value Ref Range Status   Specimen Description BLOOD LEFT ASSIST CONTROL  Final   Special Requests   Final    BOTTLES DRAWN AEROBIC AND ANAEROBIC Blood Culture adequate volume   Culture   Final    NO GROWTH 2 DAYS Performed at [facility], [address], [place], [place] [address]    Report Status PENDING  Incomplete     Total time spend on discharging this patient, including the last patient exam, discussing the hospital stay, instructions for ongoing care as it relates to all pertinent caregivers, as well as preparing the medical discharge records, prescriptions, and/or referrals as applicable, is 50 minutes.    Enzo Bi, MD  [facility] 02/27/2022, 10:47 AM

## 2022-02-27 NOTE — TOC Transition Note (Signed)
Transition of Care [facility]) - CM/SW Discharge Note   Patient Details  Name: Joanna Phillips MRN: [id number] Date of Birth: [birth year]  Transition [facility] Care [facility]) CM/SW Contact:  Laurena Slimmer, RN Phone Number: 02/27/2022, 11:28 AM   Clinical Narrative:    Spoke with patient's spouse who was at patient's bedside. Discussed HH recommendation. Patient husband prefers [facility] via [place]. Referral sent and accepted by [facility]. TOC signing off.    Final next level of care: [place] Barriers to Discharge: Barriers Resolved   Patient Goals and CMS Choice Patient states their goals for this hospitalization and ongoing recovery are:: To return home CMS Medicare.gov Compare Post Acute Care list provided to:: Patient Represenative (must comment) Choice offered to / list presented to : Spouse  Discharge Placement                  Name of family member notified: Correna Meacham Patient and family notified of of transfer: 02/27/22  Discharge Plan and Services                          HH Arranged: PT, OT [facility] Agency: [facility] [place] Date [place]: 02/27/22 Time [place]: 1128 Representative spoke with at [place]: [place]) Interventions     Readmission Risk Interventions     No data to display

## 2022-02-27 NOTE — Progress Notes (Signed)
Patient and husband both given discharge instructions, verbalized understanding of all discharge instructions.

## 2022-02-28 ENCOUNTER — Other Ambulatory Visit: Payer: Self-pay

## 2022-03-02 LAB — CULTURE, BLOOD (ROUTINE X 2)
Culture: NO GROWTH
Special Requests: ADEQUATE
Special Requests: ADEQUATE

## 2022-03-04 ENCOUNTER — Inpatient Hospital Stay: Payer: Medicare Other | Attending: Oncology

## 2022-03-04 ENCOUNTER — Telehealth: Payer: Self-pay

## 2022-03-04 ENCOUNTER — Telehealth: Payer: Self-pay | Admitting: Oncology

## 2022-03-04 DIAGNOSIS — D0511 Intraductal carcinoma in situ of right breast: Secondary | ICD-10-CM | POA: Insufficient documentation

## 2022-03-04 DIAGNOSIS — Z86000 Personal history of in-situ neoplasm of breast: Secondary | ICD-10-CM

## 2022-03-04 LAB — CBC WITH DIFFERENTIAL/PLATELET
Abs Immature Granulocytes: 0.01 10*3/uL (ref 0.00–0.07)
Basophils Absolute: 0 10*3/uL (ref 0.0–0.1)
Basophils Relative: 1 %
Eosinophils Absolute: 0 10*3/uL (ref 0.0–0.5)
Eosinophils Relative: 1 %
HCT: 32.9 % — ABNORMAL LOW (ref 36.0–46.0)
Hemoglobin: 10.8 g/dL — ABNORMAL LOW (ref 12.0–15.0)
Immature Granulocytes: 0 %
Lymphocytes Relative: 66 %
Lymphs Abs: 3.5 10*3/uL (ref 0.7–4.0)
MCH: 25.9 pg — ABNORMAL LOW (ref 26.0–34.0)
MCHC: 32.8 g/dL (ref 30.0–36.0)
MCV: 78.9 fL — ABNORMAL LOW (ref 80.0–100.0)
Monocytes Absolute: 0.5 10*3/uL (ref 0.1–1.0)
Monocytes Relative: 10 %
Neutro Abs: 1.2 10*3/uL — ABNORMAL LOW (ref 1.7–7.7)
Neutrophils Relative %: 22 %
Platelets: 260 10*3/uL (ref 150–400)
RBC: 4.17 MIL/uL (ref 3.87–5.11)
RDW: 15.5 % (ref 11.5–15.5)
WBC: 5.3 10*3/uL (ref 4.0–10.5)
nRBC: 0 % (ref 0.0–0.2)

## 2022-03-04 NOTE — Telephone Encounter (Signed)
Returned husband phone call explaining that per Dr. Elroy Channel note on 08/2021 when she last saw pt she wants labs only in 6 months and see Dr. Janese Banks in one year with additinal labs. When explaining to husband what Dr. Janese Banks suggested pts husband stated "that is not what the note said, goodbye" and hung up the call.

## 2022-03-04 NOTE — Telephone Encounter (Signed)
Patients spouse called and he would like to know why she only had labs today and not a visit with Dr. Janese Banks. He would like a call explaining the shift in appointments. He states she normally sees the dr and has labs every 6 months. Please advise.

## 2022-03-06 ENCOUNTER — Other Ambulatory Visit: Payer: Self-pay | Admitting: Internal Medicine

## 2022-03-06 DIAGNOSIS — R935 Abnormal findings on diagnostic imaging of other abdominal regions, including retroperitoneum: Secondary | ICD-10-CM

## 2022-03-06 DIAGNOSIS — R590 Localized enlarged lymph nodes: Secondary | ICD-10-CM

## 2022-03-17 ENCOUNTER — Encounter (INDEPENDENT_AMBULATORY_CARE_PROVIDER_SITE_OTHER): Payer: Self-pay

## 2022-03-19 ENCOUNTER — Ambulatory Visit
Admission: RE | Admit: 2022-03-19 | Discharge: 2022-03-19 | Disposition: A | Discharge status: Home / Self Care | Payer: Medicare Other | Source: Ambulatory Visit | Attending: Internal Medicine | Admitting: Internal Medicine

## 2022-03-19 DIAGNOSIS — R935 Abnormal findings on diagnostic imaging of other abdominal regions, including retroperitoneum: Secondary | ICD-10-CM

## 2022-03-19 DIAGNOSIS — D259 Leiomyoma of uterus, unspecified: Secondary | ICD-10-CM | POA: Diagnosis not present

## 2022-03-19 DIAGNOSIS — R590 Localized enlarged lymph nodes: Secondary | ICD-10-CM | POA: Diagnosis present

## 2022-03-19 DIAGNOSIS — K449 Diaphragmatic hernia without obstruction or gangrene: Secondary | ICD-10-CM | POA: Diagnosis not present

## 2022-03-19 DIAGNOSIS — Z856 Personal history of leukemia: Secondary | ICD-10-CM | POA: Diagnosis not present

## 2022-03-19 DIAGNOSIS — Z853 Personal history of malignant neoplasm of breast: Secondary | ICD-10-CM | POA: Diagnosis not present

## 2022-03-19 LAB — GLUCOSE, CAPILLARY: Glucose-Capillary: 115 mg/dL — ABNORMAL HIGH (ref 70–99)

## 2022-03-19 MED ORDER — FLUDEOXYGLUCOSE F - 18 (FDG) INJECTION
9.5000 | Freq: Once | INTRAVENOUS | Status: AC | PRN
  Administered 2022-03-19: 10.38 via INTRAVENOUS

## 2022-03-24 ENCOUNTER — Other Ambulatory Visit: Payer: Self-pay | Admitting: *Deleted

## 2022-03-24 DIAGNOSIS — D0511 Intraductal carcinoma in situ of right breast: Secondary | ICD-10-CM

## 2022-03-24 DIAGNOSIS — C9110 Chronic lymphocytic leukemia of B-cell type not having achieved remission: Secondary | ICD-10-CM

## 2022-04-02 ENCOUNTER — Encounter: Payer: Self-pay | Admitting: Oncology

## 2022-04-02 ENCOUNTER — Inpatient Hospital Stay (HOSPITAL_BASED_OUTPATIENT_CLINIC_OR_DEPARTMENT_OTHER): Payer: Medicare Other | Admitting: Oncology

## 2022-04-02 ENCOUNTER — Inpatient Hospital Stay: Payer: Medicare Other | Attending: Oncology

## 2022-04-02 DIAGNOSIS — Z9049 Acquired absence of other specified parts of digestive tract: Secondary | ICD-10-CM | POA: Diagnosis not present

## 2022-04-02 DIAGNOSIS — Z880 Allergy status to penicillin: Secondary | ICD-10-CM | POA: Diagnosis not present

## 2022-04-02 DIAGNOSIS — Z803 Family history of malignant neoplasm of breast: Secondary | ICD-10-CM | POA: Diagnosis not present

## 2022-04-02 DIAGNOSIS — Z885 Allergy status to narcotic agent status: Secondary | ICD-10-CM | POA: Diagnosis not present

## 2022-04-02 DIAGNOSIS — Z79899 Other long term (current) drug therapy: Secondary | ICD-10-CM | POA: Insufficient documentation

## 2022-04-02 DIAGNOSIS — C9110 Chronic lymphocytic leukemia of B-cell type not having achieved remission: Secondary | ICD-10-CM | POA: Diagnosis not present

## 2022-04-02 DIAGNOSIS — D0511 Intraductal carcinoma in situ of right breast: Secondary | ICD-10-CM

## 2022-04-02 DIAGNOSIS — Z833 Family history of diabetes mellitus: Secondary | ICD-10-CM | POA: Insufficient documentation

## 2022-04-02 DIAGNOSIS — Z923 Personal history of irradiation: Secondary | ICD-10-CM | POA: Insufficient documentation

## 2022-04-02 DIAGNOSIS — Z86000 Personal history of in-situ neoplasm of breast: Secondary | ICD-10-CM

## 2022-04-02 DIAGNOSIS — Z881 Allergy status to other antibiotic agents status: Secondary | ICD-10-CM | POA: Diagnosis not present

## 2022-04-02 DIAGNOSIS — R5383 Other fatigue: Secondary | ICD-10-CM | POA: Diagnosis not present

## 2022-04-02 DIAGNOSIS — Z801 Family history of malignant neoplasm of trachea, bronchus and lung: Secondary | ICD-10-CM | POA: Insufficient documentation

## 2022-04-02 DIAGNOSIS — Z853 Personal history of malignant neoplasm of breast: Secondary | ICD-10-CM | POA: Insufficient documentation

## 2022-04-02 LAB — COMPREHENSIVE METABOLIC PANEL
ALT: 24 U/L (ref 0–44)
AST: 30 U/L (ref 15–41)
Albumin: 4.4 g/dL (ref 3.5–5.0)
Alkaline Phosphatase: 82 U/L (ref 38–126)
Anion gap: 6 (ref 5–15)
BUN: 20 mg/dL (ref 8–23)
CO2: 28 mmol/L (ref 22–32)
Calcium: 9 mg/dL (ref 8.9–10.3)
Chloride: 101 mmol/L (ref 98–111)
Creatinine, Ser: 0.69 mg/dL (ref 0.44–1.00)
GFR, Estimated: >60 mL/min (ref >60)
Glucose, Bld: 113 mg/dL — ABNORMAL HIGH (ref 70–99)
Potassium: 4.4 mmol/L (ref 3.5–5.1)
Sodium: 135 mmol/L (ref 135–145)
Total Bilirubin: 0.6 mg/dL (ref 0.3–1.2)
Total Protein: 6.9 g/dL (ref 6.5–8.1)

## 2022-04-02 LAB — CBC WITH DIFFERENTIAL/PLATELET
Abs Immature Granulocytes: 0.02 10*3/uL (ref 0.00–0.07)
Basophils Absolute: 0.1 10*3/uL (ref 0.0–0.1)
Basophils Relative: 1 %
Eosinophils Absolute: 0.1 10*3/uL (ref 0.0–0.5)
Eosinophils Relative: 1 %
HCT: 38.4 % (ref 36.0–46.0)
Hemoglobin: 12.3 g/dL (ref 12.0–15.0)
Immature Granulocytes: 0 %
Lymphocytes Relative: 70 %
Lymphs Abs: 5.3 10*3/uL — ABNORMAL HIGH (ref 0.7–4.0)
MCH: 25.8 pg — ABNORMAL LOW (ref 26.0–34.0)
MCHC: 32 g/dL (ref 30.0–36.0)
MCV: 80.7 fL (ref 80.0–100.0)
Monocytes Absolute: 0.7 10*3/uL (ref 0.1–1.0)
Monocytes Relative: 9 %
Neutro Abs: 1.5 10*3/uL — ABNORMAL LOW (ref 1.7–7.7)
Neutrophils Relative %: 19 %
Platelets: 177 10*3/uL (ref 150–400)
RBC: 4.76 MIL/uL (ref 3.87–5.11)
RDW: 15.9 % — ABNORMAL HIGH (ref 11.5–15.5)
WBC: 7.5 10*3/uL (ref 4.0–10.5)
nRBC: 0 % (ref 0.0–0.2)

## 2022-04-02 LAB — LACTATE DEHYDROGENASE: LDH: 165 U/L (ref 98–192)

## 2022-04-02 NOTE — Progress Notes (Signed)
Hematology/Oncology Consult note [facility]  [phone] [phone]  Patient Care Team: Kirk Ruths, MD as PCP - General (Internal Medicine) Christene Lye, MD (General Surgery) Schermerhorn, Gwen Her, MD as Referring Physician (Obstetrics and Gynecology) Silvio Pate Nona Dell, MD as Referring Physician (General Surgery)   Name of the patient: Joanna Phillips  [id number]  11-22-1950   Date of visit: 04/02/22  Diagnosis-Rai stage I CLL History of right breast DCIS  Chief complaint/ Reason for visit-routine follow up of CLL  Heme/Onc history: Patient is a 71 year old female diagnosed with right breast DCIS ER positive in October 2017.Pathology showed 3.9 cm grade 3 DCISOn lumpectomy.  She completed adjuvant radiation treatment and started Arimidex In February 2018.  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  SPEP was normal.  Chest, abdomen, and pelvic CT scan revealed small diffuse nodes.  She has been under observation for her CLL and has not required any treatment so far.    Interval history-patient was recently hospitalized in October 2023 for sepsis secondary to UTI as well as diverticulitis.  She is gradually recovering from the same.  She does report some redness in her left eye for the last few days.  ECOG PS- 1 Pain scale- 0   Review of systems- Review of Systems  Constitutional:  Positive for malaise/fatigue. Negative for chills, fever and weight loss.  HENT:  Negative for congestion, ear discharge and nosebleeds.   Eyes:  Negative for blurred vision.  Respiratory:  Negative for cough, hemoptysis, sputum  production, shortness of breath and wheezing.   Cardiovascular:  Negative for chest pain, palpitations, orthopnea and claudication.  Gastrointestinal:  Negative for abdominal pain, blood in stool, constipation, diarrhea, heartburn, melena, nausea and vomiting.  Genitourinary:  Negative for dysuria, flank pain, frequency, hematuria and urgency.  Musculoskeletal:  Negative for back pain, joint pain and myalgias.  Skin:  Negative for rash.  Neurological:  Negative for dizziness, tingling, focal weakness, seizures, weakness and headaches.  Endo/Heme/Allergies:  Does not bruise/bleed easily.  Psychiatric/Behavioral:  Negative for depression and suicidal ideas. The patient does not have insomnia.       Allergies  Allergen Reactions   Amoxicillin Hives   Hydrocodone Itching   Sulfamethoxazole-Trimethoprim Nausea Only   Allopurinol Rash     Past Medical History:  Diagnosis Date   Arthritis    Breast cancer ([place]) 2017   DCIS   CLL (chronic lymphocytic leukemia) ([place]) 2011   DCIS (ductal carcinoma in situ) 03/11/2016   GERD (gastroesophageal reflux disease)    Hyponatremia    Personal history of radiation therapy 2017     Past Surgical History:  Procedure Laterality Date   BREAST BIOPSY Right 02/12/2016   DCIS   BREAST BIOPSY Right 12/27/2019   path pending, calcs, x marker   BREAST LUMPECTOMY Right 03/11/2016   DCIS neg margins   CHOLECYSTECTOMY  1996   COLONOSCOPY  2013   COLONOSCOPY WITH PROPOFOL N/A 04/19/2021   Procedure: COLONOSCOPY WITH PROPOFOL;  Surgeon: Annamaria Helling, DO;  Location: [facility] ENDOSCOPY;  Service: Gastroenterology;  Laterality: N/A;   squamous cell skin on scalp surgery Right     Social History  Socioeconomic History   Marital status: Married    Spouse name: Not on file   Number of children: Not on file   Years of education: Not on file   Highest education level: Not on file  Occupational History   Occupation: retired   Tobacco Use    Smoking status: Never   Smokeless tobacco: Never  Vaping Use   Vaping Use: Never used  Substance and Sexual Activity   Alcohol use: No   Drug use: No   Sexual activity: Not on file  Other Topics Concern   Not on file  Social History Narrative   Not on file   Social Determinants of Health   Financial Resource Strain: Low Risk  (01/11/2019)   Overall Financial Resource Strain (CARDIA)    Difficulty of Paying Living Expenses: Not hard at all  Food Insecurity: No Food Insecurity (02/25/2022)   Hunger Vital Sign    Worried About Running Out of Food in the Last Year: Never true    [place] in the Last Year: Never true  Transportation Needs: No Transportation Needs (02/25/2022)   PRAPARE - Hydrologist (Medical): No    Lack of Transportation (Non-Medical): No  Physical Activity: Insufficiently Active (01/11/2019)   Exercise Vital Sign    Days of Exercise per Week: 3 days    Minutes of Exercise per Session: 30 min  Stress: No Stress Concern Present (01/11/2019)   [place]    Feeling of Stress : Not at all  Social Connections: Moderately Integrated (01/11/2019)   Social Connection and Isolation Panel [NHANES]    Frequency of Communication with Friends and Family: More than three times a week    Frequency of Social Gatherings with Friends and Family: More than three times a week    Attends Religious Services: More than 4 times per year    Active Member of [employer] or Organizations: No    Attends Archivist Meetings: Never    Marital Status: Married  Human resources officer Violence: Not At Risk (02/25/2022)   Humiliation, Afraid, Rape, and Kick questionnaire    Fear of Current or Ex-Partner: No    Emotionally Abused: No    Physically Abused: No    Sexually Abused: No    Family History  Problem Relation Age of Onset   Cancer Father        lung   Diabetes Mother    Breast  cancer Maternal Aunt 60   Breast cancer Maternal Aunt 23     Current Outpatient Medications:    acetaminophen (TYLENOL) 650 MG CR tablet, Take by mouth., Disp: , Rfl:    aspirin EC 81 MG tablet, Take 81 mg by mouth daily., Disp: , Rfl:    calcium-vitamin D (OSCAL WITH D) 500-200 MG-UNIT TABS tablet, Take 1 tablet by mouth daily., Disp: , Rfl:    Carboxymethylcellulose Sodium (REFRESH CELLUVISC OP), Apply 1 drop to eye every 4 (four) hours as needed., Disp: , Rfl:    gabapentin (NEURONTIN) 800 MG tablet, Take 1 tablet (800 mg total) by mouth 3 (three) times daily. Home med., Disp: 270 tablet, Rfl: 1   omeprazole (PRILOSEC) 20 MG capsule, Take 1 capsule by mouth once daily, Disp: 90 capsule, Rfl: 3   OXcarbazepine (TRILEPTAL) 150 MG tablet, Take 300 mg by mouth 2 (two) times daily., Disp: , Rfl:    vitamin B-12 (CYANOCOBALAMIN) 250 MCG tablet, Take 250 mcg  by mouth daily., Disp: , Rfl:    fluticasone (FLONASE) 50 MCG/ACT nasal spray, Place into the nose., Disp: , Rfl:    ondansetron (ZOFRAN-ODT) 4 MG disintegrating tablet, Take 4 mg by mouth every 8 (eight) hours as needed. (Patient not taking: Reported on 04/02/2022), Disp: , Rfl:   Physical exam: There were no vitals filed for this visit. Physical Exam Cardiovascular:     Rate and Rhythm: Normal rate and regular rhythm.     Heart sounds: Normal heart sounds.  Pulmonary:     Effort: Pulmonary effort is normal.     Breath sounds: Normal breath sounds.  Abdominal:     General: Bowel sounds are normal.     Palpations: Abdomen is soft.  Lymphadenopathy:     Comments: No palpable supraclavicular cervical axillary or inguinal adenopathy.  Skin:    General: Skin is warm and dry.  Neurological:     Mental Status: She is alert and oriented to person, place, and time.         Latest Ref Rng & Units 04/02/2022    8:44 AM  CMP  Glucose 70 - 99 mg/dL 113   BUN 8 - 23 mg/dL 20   Creatinine 0.44 - 1.00 mg/dL 0.69   Sodium 135 - 145 mmol/L  135   Potassium 3.5 - 5.1 mmol/L 4.4   Chloride 98 - 111 mmol/L 101   CO2 22 - 32 mmol/L 28   Calcium 8.9 - 10.3 mg/dL 9.0   Total Protein 6.5 - 8.1 g/dL 6.9   Total Bilirubin 0.3 - 1.2 mg/dL 0.6   Alkaline Phos 38 - 126 U/L 82   AST 15 - 41 U/L 30   ALT 0 - 44 U/L 24       Latest Ref Rng & Units 04/02/2022    8:44 AM  CBC  WBC 4.0 - 10.5 K/uL 7.5   Hemoglobin 12.0 - 15.0 g/dL 12.3   Hematocrit 36.0 - 46.0 % 38.4   Platelets 150 - 400 K/uL 177     No images are attached to the encounter.  NM PET Image Initial (PI) Skull Base To Thigh (F-18 FDG)  Result Date: 03/21/2022 CLINICAL DATA:  Initial treatment strategy for history of chronic lymphocytic leukemia. Retroperitoneal lymphadenopathy. History of breast cancer. Leukemia diagnosed in 2011. EXAM: NUCLEAR MEDICINE PET SKULL BASE TO THIGH TECHNIQUE: 10.4 mCi F-18 FDG was injected intravenously. Full-ring PET imaging was performed from the skull base to thigh after the radiotracer. CT data was obtained and used for attenuation correction and anatomic localization. Fasting blood glucose: 115 mg/dl COMPARISON:  Abdominopelvic CT of 02/21/2022. Chest abdomen and pelvic CTs of 05/27/2010. FINDINGS: Mediastinal blood pool activity: SUV max 1.4 Liver activity: SUV max 2.8 NECK: No areas of abnormal hypermetabolism. Incidental CT findings: Increased number and less so size of cervical nodes. Example right posterior triangle node of 1.3 x 0.8 cm on 31/2. Right level 2 jugular node of 1.0 cm on 35/2. Right parotid nodules are presumably lymph nodes including at 1.3 cm on 19/2. CHEST: The majority of thoracic adenopathy is not significantly hypermetabolic. An AP window node measures 9 mm and a S.U.V. max of 2.7 on 76/2. Non tracer avid left axillary nodes of up to 12 mm on 71/2. These are significantly decreased in size from 2012, where they measured up to 1.6 cm. Index prevascular node of 1.7 cm on 68/2, enlarged from 1.3 cm on 05/27/2010. No pulmonary  parenchymal hypermetabolism. Incidental CT findings: Tiny hiatal  hernia. Mild cardiomegaly. Pulmonary artery enlargement, outflow tract 3.1 cm. ABDOMEN/PELVIS: The majority of abdominopelvic adenopathy is non tracer avid. Right external iliac node measures 1.4 cm and a S.U.V. max of 1.8 on 184/2 versus 1.0 cm on 05/27/2010. A right common iliac nodal mass measures 3.7 x 3.4 cm and a S.U.V. max of 2.6 on 174/2. Increased from 1.1 cm on 05/27/2010. Non tracer avid abdominal retroperitoneal nodes including at up to 1.2 cm on 148/2 versus 0.9 cm on 05/27/10. Incidental CT findings: Splenomegaly. Cholecystectomy. Abdominal aortic atherosclerosis. Extensive colonic diverticulosis. Calcified posterior uterine fundal 2.8 cm fibroid. Pelvic floor laxity. SKELETON: No abnormal marrow activity. Incidental CT findings: None. IMPRESSION: 1. Relatively non tracer avid lymphoproliferative process within the neck, chest, abdomen, and pelvis. Primarily moderately progressive compared to 05/27/2010. Left axillary adenopathy is improved since the remote CT. ([place]) 3 2. Incidental findings, including: Small hiatal hernia. Uterine fibroid. Pelvic floor laxity. Pulmonary artery enlargement suggests pulmonary arterial hypertension. Electronically Signed   By: Abigail Miyamoto M.D.   On: 03/21/2022 08:54     Assessment and plan- Patient is a 71 y.o. female with Rai stage I CLL here for routine follow-up  Rai stage I CLL: Clinically patient has no palpable adenopathy or significant splenomegaly.  Her white cell count is normal at 7.5 with an absolute lymphocyte count which is mildly elevated at 5.3.  Hemoglobin is improved from 8.7 a month ago presently to 12.3 platelets are normal.  No significant B symptoms.  PET scan showsGeneralized adenopathy mainly in the hilar region as well as intra-abdominal adenopathy.  The largest area is the common iliac nodal mass measuring 3.7 x 3.4 cm.  SUV uptake in all these areas is relatively low  ranging from 1.8-2.6.  This would be consistent with a low-grade indolent B-cell neoplasm such as CLL.  It is not suggestive of high-grade transformation such as DLBCL.  Patient does not require her treatment for her CLL just for the iliac nodal mass of 3.7 cm.  It is not causing any significant organ compromise.Suspect her dominant pain which the patient had was likely secondary to diverticulitis as she reports improvement in her symptoms after she has been discharged.  I again reviewed all the indications to treat CLL including palpable bulky adenopathy,'s worsening splenomegaly, cytopenias or B symptoms which the patient presently does not have.  I will continue to follow-up with her in 6 months with labs  With regards to DCIS patient has completed 5 years of surveillance and only needs a mammogram every year moving forward and her next one would be due in August 2024.     Visit Diagnosis 1. Ductal carcinoma in situ (DCIS) of right breast   2. CLL (chronic lymphocytic leukemia) ([place])      Dr. Randa Evens, MD, MPH [facility] [phone] 04/02/2022 11:34 AM

## 2022-09-08 ENCOUNTER — Ambulatory Visit: Payer: Medicare Other | Admitting: Oncology

## 2022-09-08 ENCOUNTER — Other Ambulatory Visit: Payer: Medicare Other

## 2022-10-01 ENCOUNTER — Other Ambulatory Visit: Payer: Self-pay | Admitting: *Deleted

## 2022-10-01 ENCOUNTER — Inpatient Hospital Stay (HOSPITAL_BASED_OUTPATIENT_CLINIC_OR_DEPARTMENT_OTHER): Payer: Medicare Other | Admitting: Oncology

## 2022-10-01 ENCOUNTER — Encounter: Payer: Self-pay | Admitting: Oncology

## 2022-10-01 ENCOUNTER — Inpatient Hospital Stay: Payer: Medicare Other | Attending: Oncology

## 2022-10-01 VITALS — BP 106/59 | HR 60 | Temp 97.4°F | Wt 192.6 lb

## 2022-10-01 DIAGNOSIS — Z801 Family history of malignant neoplasm of trachea, bronchus and lung: Secondary | ICD-10-CM | POA: Insufficient documentation

## 2022-10-01 DIAGNOSIS — Z9049 Acquired absence of other specified parts of digestive tract: Secondary | ICD-10-CM | POA: Insufficient documentation

## 2022-10-01 DIAGNOSIS — Z833 Family history of diabetes mellitus: Secondary | ICD-10-CM | POA: Insufficient documentation

## 2022-10-01 DIAGNOSIS — Z880 Allergy status to penicillin: Secondary | ICD-10-CM | POA: Diagnosis not present

## 2022-10-01 DIAGNOSIS — Z170 Estrogen receptor positive status [ER+]: Secondary | ICD-10-CM | POA: Diagnosis not present

## 2022-10-01 DIAGNOSIS — Z803 Family history of malignant neoplasm of breast: Secondary | ICD-10-CM | POA: Insufficient documentation

## 2022-10-01 DIAGNOSIS — C9110 Chronic lymphocytic leukemia of B-cell type not having achieved remission: Secondary | ICD-10-CM | POA: Insufficient documentation

## 2022-10-01 DIAGNOSIS — Z1231 Encounter for screening mammogram for malignant neoplasm of breast: Secondary | ICD-10-CM

## 2022-10-01 DIAGNOSIS — Z79899 Other long term (current) drug therapy: Secondary | ICD-10-CM | POA: Insufficient documentation

## 2022-10-01 DIAGNOSIS — Z08 Encounter for follow-up examination after completed treatment for malignant neoplasm: Secondary | ICD-10-CM | POA: Diagnosis not present

## 2022-10-01 DIAGNOSIS — Z881 Allergy status to other antibiotic agents status: Secondary | ICD-10-CM | POA: Insufficient documentation

## 2022-10-01 DIAGNOSIS — Z885 Allergy status to narcotic agent status: Secondary | ICD-10-CM | POA: Diagnosis not present

## 2022-10-01 DIAGNOSIS — D0511 Intraductal carcinoma in situ of right breast: Secondary | ICD-10-CM | POA: Diagnosis present

## 2022-10-01 DIAGNOSIS — Z780 Asymptomatic menopausal state: Secondary | ICD-10-CM

## 2022-10-01 DIAGNOSIS — Z923 Personal history of irradiation: Secondary | ICD-10-CM | POA: Diagnosis not present

## 2022-10-01 DIAGNOSIS — Z86000 Personal history of in-situ neoplasm of breast: Secondary | ICD-10-CM | POA: Diagnosis not present

## 2022-10-01 LAB — CBC WITH DIFFERENTIAL/PLATELET
Abs Immature Granulocytes: 0.01 10*3/uL (ref 0.00–0.07)
Basophils Absolute: 0.1 10*3/uL (ref 0.0–0.1)
Basophils Relative: 1 %
Eosinophils Absolute: 0.1 10*3/uL (ref 0.0–0.5)
Eosinophils Relative: 1 %
HCT: 35.6 % — ABNORMAL LOW (ref 36.0–46.0)
Hemoglobin: 11.5 g/dL — ABNORMAL LOW (ref 12.0–15.0)
Immature Granulocytes: 0 %
Lymphocytes Relative: 70 %
Lymphs Abs: 5.6 10*3/uL — ABNORMAL HIGH (ref 0.7–4.0)
MCH: 25.8 pg — ABNORMAL LOW (ref 26.0–34.0)
MCHC: 32.3 g/dL (ref 30.0–36.0)
MCV: 80 fL (ref 80.0–100.0)
Monocytes Absolute: 0.6 10*3/uL (ref 0.1–1.0)
Monocytes Relative: 7 %
Neutro Abs: 1.7 10*3/uL (ref 1.7–7.7)
Neutrophils Relative %: 21 %
Platelets: 254 10*3/uL (ref 150–400)
RBC: 4.45 MIL/uL (ref 3.87–5.11)
RDW: 14.7 % (ref 11.5–15.5)
Smear Review: ADEQUATE
WBC: 8 10*3/uL (ref 4.0–10.5)
nRBC: 0 % (ref 0.0–0.2)

## 2022-10-01 LAB — COMPREHENSIVE METABOLIC PANEL
ALT: 18 U/L (ref 0–44)
AST: 23 U/L (ref 15–41)
Albumin: 4.2 g/dL (ref 3.5–5.0)
Alkaline Phosphatase: 83 U/L (ref 38–126)
Anion gap: 8 (ref 5–15)
BUN: 26 mg/dL — ABNORMAL HIGH (ref 8–23)
CO2: 25 mmol/L (ref 22–32)
Calcium: 8.8 mg/dL — ABNORMAL LOW (ref 8.9–10.3)
Chloride: 95 mmol/L — ABNORMAL LOW (ref 98–111)
Creatinine, Ser: 0.78 mg/dL (ref 0.44–1.00)
GFR, Estimated: >60 mL/min (ref >60)
Glucose, Bld: 110 mg/dL — ABNORMAL HIGH (ref 70–99)
Potassium: 4.4 mmol/L (ref 3.5–5.1)
Sodium: 128 mmol/L — ABNORMAL LOW (ref 135–145)
Total Bilirubin: 0.5 mg/dL (ref 0.3–1.2)
Total Protein: 6.6 g/dL (ref 6.5–8.1)

## 2022-10-01 LAB — FERRITIN: Ferritin: 68 ng/mL (ref 11–307)

## 2022-10-01 LAB — IRON AND TIBC
Iron: 88 ug/dL (ref 28–170)
Saturation Ratios: 21 % (ref 10.4–31.8)
TIBC: 430 ug/dL (ref 250–450)
UIBC: 342 ug/dL

## 2022-10-01 LAB — LACTATE DEHYDROGENASE: LDH: 152 U/L (ref 98–192)

## 2022-10-01 NOTE — Progress Notes (Signed)
Hematology/Oncology Consult note [facility]  [phone] [phone]  Patient Care Team: Lauro Regulus, MD as PCP - General (Internal Medicine) Kieth Brightly, MD (General Surgery) Schermerhorn, Ihor Austin, MD as Referring Physician (Obstetrics and Gynecology) Selmer Dominion Rance Muir, MD as Referring Physician (General Surgery)   Name of the patient: Joanna Phillips  [id number]  1950-10-04   Date of visit: 10/01/22  Diagnosis- Rai stage I CLL History of right breast DCIS  Chief complaint/ Reason for visit-routine follow-up of CLL and DCIS  Heme/Onc history: Patient is a 72 year old female diagnosed with right breast DCIS ER positive in October 2017.Pathology showed 3.9 cm grade 3 DCISOn lumpectomy.  She completed adjuvant radiation treatment and started Arimidex In February 2018.  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  SPEP was normal.  Chest, abdomen, and pelvic CT scan revealed small diffuse nodes.  She has been under observation for her CLL and has not required any treatment so far.    Interval history-no recent hospitalizations.  Appetite and weight have remained stable.  ECOG PS- 1 Pain scale- 0   Review of systems- Review of Systems  Constitutional:  Negative for chills, fever, malaise/fatigue and weight loss.  HENT:  Negative for congestion, ear discharge and nosebleeds.   Eyes:  Negative for blurred vision.  Respiratory:  Negative for cough, hemoptysis, sputum production, shortness of breath and wheezing.   Cardiovascular:  Negative for chest pain, palpitations, orthopnea and claudication.  Gastrointestinal:   Negative for abdominal pain, blood in stool, constipation, diarrhea, heartburn, melena, nausea and vomiting.  Genitourinary:  Negative for dysuria, flank pain, frequency, hematuria and urgency.  Musculoskeletal:  Negative for back pain, joint pain and myalgias.  Skin:  Negative for rash.  Neurological:  Negative for dizziness, tingling, focal weakness, seizures, weakness and headaches.  Endo/Heme/Allergies:  Does not bruise/bleed easily.  Psychiatric/Behavioral:  Negative for depression and suicidal ideas. The patient does not have insomnia.       Allergies  Allergen Reactions   Amoxicillin Hives   Hydrocodone Itching   Sulfamethoxazole-Trimethoprim Nausea Only   Allopurinol Rash     Past Medical History:  Diagnosis Date   Arthritis    Breast cancer (HCC) 2017   DCIS   CLL (chronic lymphocytic leukemia) (HCC) 2011   DCIS (ductal carcinoma in situ) 03/11/2016   GERD (gastroesophageal reflux disease)    Hyponatremia    Personal history of radiation therapy 2017     Past Surgical History:  Procedure Laterality Date   BREAST BIOPSY Right 02/12/2016   DCIS   BREAST BIOPSY Right 12/27/2019   path pending, calcs, x marker   BREAST LUMPECTOMY Right 03/11/2016   DCIS neg margins   CHOLECYSTECTOMY  1996   COLONOSCOPY  2013   COLONOSCOPY WITH PROPOFOL N/A 04/19/2021   Procedure: COLONOSCOPY WITH PROPOFOL;  Surgeon: Jaynie Collins, DO;  Location[facility];  Service: Gastroenterology;  Laterality: N/A;   squamous cell skin on scalp surgery Right     Social History   Socioeconomic History   Marital status: Married    Spouse name: Not on file   Number of children: Not on file   Years of  education: Not on file   Highest education level: Not on file  Occupational History   Occupation: retired   Tobacco Use   Smoking status: Never   Smokeless tobacco: Never  Vaping Use   Vaping Use: Never used  Substance and Sexual Activity   Alcohol use: No   Drug use: No    Sexual activity: Not on file  Other Topics Concern   Not on file  Social History Narrative   Not on file   Social Determinants of Health   Financial Resource Strain: Low Risk  (01/11/2019)   Overall Financial Resource Strain (CARDIA)    Difficulty of Paying Living Expenses: Not hard at all  Food Insecurity: No Food Insecurity (02/25/2022)   Hunger Vital Sign    Worried About Running Out of Food in the Last Year: Never true    Ran Out of Food in the Last Year: Never true  Transportation Needs: No Transportation Needs (02/25/2022)   PRAPARE - Administrator, Civil Service (Medical): No    Lack of Transportation (Non-Medical): No  Physical Activity: Insufficiently Active (01/11/2019)   Exercise Vital Sign    Days of Exercise per Week: 3 days    Minutes of Exercise per Session: 30 min  Stress: No Stress Concern Present (01/11/2019)   [employer] of Occupational Health - Occupational Stress Questionnaire    Feeling of Stress : Not at all  Social Connections: Moderately Integrated (01/11/2019)   Social Connection and Isolation Panel [NHANES]    Frequency of Communication with Friends and Family: More than three times a week    Frequency of Social Gatherings with Friends and Family: More than three times a week    Attends Religious Services: More than 4 times per year    Active Member of [employer] or Organizations: No    Attends Banker Meetings: Never    Marital Status: Married  Catering manager Violence: Not At Risk (02/25/2022)   Humiliation, Afraid, Rape, and Kick questionnaire    Fear of Current or Ex-Partner: No    Emotionally Abused: No    Physically Abused: No    Sexually Abused: No    Family History  Problem Relation Age of Onset   Cancer Father        lung   Diabetes Mother    Breast cancer Maternal Aunt 60   Breast cancer Maternal Aunt 27     Current Outpatient Medications:    acetaminophen (TYLENOL) 650 MG CR tablet, Take by mouth.,  Disp: , Rfl:    aspirin EC 81 MG tablet, Take 81 mg by mouth daily., Disp: , Rfl:    calcium-vitamin D (OSCAL WITH D) 500-200 MG-UNIT TABS tablet, Take 1 tablet by mouth daily., Disp: , Rfl:    Carboxymethylcellulose Sodium (REFRESH CELLUVISC OP), Apply 1 drop to eye every 4 (four) hours as needed., Disp: , Rfl:    fluticasone (FLONASE) 50 MCG/ACT nasal spray, Place into the nose., Disp: , Rfl:    gabapentin (NEURONTIN) 800 MG tablet, Take 1 tablet (800 mg total) by mouth 3 (three) times daily. Home med., Disp: 270 tablet, Rfl: 1   omeprazole (PRILOSEC) 20 MG capsule, Take 1 capsule by mouth once daily, Disp: 90 capsule, Rfl: 3   OXcarbazepine (TRILEPTAL) 150 MG tablet, Take 300 mg by mouth 2 (two) times daily., Disp: , Rfl:    vitamin B-12 (CYANOCOBALAMIN) 250 MCG tablet, Take 250 mcg by mouth daily., Disp: , Rfl:    ondansetron (ZOFRAN-ODT)  4 MG disintegrating tablet, Take 4 mg by mouth every 8 (eight) hours as needed. (Patient not taking: Reported on 04/02/2022), Disp: , Rfl:   Physical exam:  Vitals:   10/01/22 0938  BP: (!) 106/59  Pulse: 60  Temp: (!) 97.4 F (36.3 C)  TempSrc: Tympanic  SpO2: 100%  Weight: 192 lb 9.6 oz (87.4 kg)   Physical Exam Cardiovascular:     Rate and Rhythm: Normal rate and regular rhythm.     Heart sounds: Normal heart sounds.  Pulmonary:     Effort: Pulmonary effort is normal.     Breath sounds: Normal breath sounds.  Abdominal:     General: Bowel sounds are normal.     Palpations: Abdomen is soft.  Lymphadenopathy:     Comments: No palpable cervical, supraclavicular, axillary or inguinal adenopathy    Skin:    General: Skin is warm and dry.  Neurological:     Mental Status: She is alert and oriented to person, place, and time.    Breast exam was performed in seated and lying down position. Patient is status post right lumpectomy with a well-healed surgical scar. No evidence of any palpable masses. No evidence of axillary adenopathy. No  evidence of any palpable masses or lumps in the left breast. No evidence of leftt axillary adenopathy      Latest Ref Rng & Units 10/01/2022    9:27 AM  CMP  Glucose 70 - 99 mg/dL 161   BUN 8 - 23 mg/dL 26   Creatinine 0.96 - 1.00 mg/dL 0.45   Sodium 409 - 811 mmol/L 128   Potassium 3.5 - 5.1 mmol/L 4.4   Chloride 98 - 111 mmol/L 95   CO2 22 - 32 mmol/L 25   Calcium 8.9 - 10.3 mg/dL 8.8   Total Protein 6.5 - 8.1 g/dL 6.6   Total Bilirubin 0.3 - 1.2 mg/dL 0.5   Alkaline Phos 38 - 126 U/L 83   AST 15 - 41 U/L 23   ALT 0 - 44 U/L 18       Latest Ref Rng & Units 10/01/2022    9:27 AM  CBC  WBC 4.0 - 10.5 K/uL 8.0   Hemoglobin 12.0 - 15.0 g/dL 91.4   Hematocrit 78.2 - 46.0 % 35.6   Platelets 150 - 400 K/uL 254      Assessment and plan- Patient is a 72 y.o. female here for routine follow-up of following issues:  Rai stage I CLL: White cell count is normal hemoglobin is more than 10 and platelets are normal.  No B symptoms and no palpable adenopathy.  Her PET scan which was ordered by primary care doctor last year showed increase in the size of iliac lymph nodes up to 3.7 cm compared to 1.2 cm back in 2012.  However she is asymptomatic from the standpoint and therefore does not require any treatment for her CLL at this time.  History of right breast DCIS: She has completed 5 years of endocrine therapy in February 2023.  Clinically she is doing well with no concerning signs and symptoms of recurrence based on today's exam.  She will be due for mammogram in August 2024 which I will schedule.   Visit Diagnosis 1. CLL (chronic lymphocytic leukemia) (HCC)   2. Encounter for follow-up surveillance of ductal carcinoma in situ (DCIS) of breast      Dr. Owens Shark, MD, MPH [facility] at [facility] [phone] 10/01/2022 12:44 PM

## 2022-12-29 ENCOUNTER — Ambulatory Visit
Admission: RE | Admit: 2022-12-29 | Discharge: 2022-12-29 | Disposition: A | Discharge status: Home / Self Care | Payer: Medicare Other | Source: Ambulatory Visit | Attending: Oncology | Admitting: Oncology

## 2022-12-29 DIAGNOSIS — Z780 Asymptomatic menopausal state: Secondary | ICD-10-CM | POA: Insufficient documentation

## 2022-12-29 DIAGNOSIS — Z853 Personal history of malignant neoplasm of breast: Secondary | ICD-10-CM | POA: Diagnosis not present

## 2022-12-29 DIAGNOSIS — D0511 Intraductal carcinoma in situ of right breast: Secondary | ICD-10-CM | POA: Diagnosis present

## 2022-12-29 DIAGNOSIS — Z1231 Encounter for screening mammogram for malignant neoplasm of breast: Secondary | ICD-10-CM | POA: Diagnosis present

## 2023-01-01 ENCOUNTER — Inpatient Hospital Stay: Payer: Medicare Other | Attending: Oncology

## 2023-01-01 DIAGNOSIS — C9110 Chronic lymphocytic leukemia of B-cell type not having achieved remission: Secondary | ICD-10-CM | POA: Diagnosis present

## 2023-01-01 DIAGNOSIS — D0511 Intraductal carcinoma in situ of right breast: Secondary | ICD-10-CM | POA: Insufficient documentation

## 2023-01-01 LAB — CBC WITH DIFFERENTIAL/PLATELET
Abs Immature Granulocytes: 0.01 10*3/uL (ref 0.00–0.07)
Basophils Absolute: 0.1 10*3/uL (ref 0.0–0.1)
Basophils Relative: 1 %
Eosinophils Absolute: 0.1 10*3/uL (ref 0.0–0.5)
Eosinophils Relative: 1 %
HCT: 35.5 % — ABNORMAL LOW (ref 36.0–46.0)
Hemoglobin: 11.4 g/dL — ABNORMAL LOW (ref 12.0–15.0)
Immature Granulocytes: 0 %
Lymphocytes Relative: 72 %
Lymphs Abs: 7.1 10*3/uL — ABNORMAL HIGH (ref 0.7–4.0)
MCH: 26.2 pg (ref 26.0–34.0)
MCHC: 32.1 g/dL (ref 30.0–36.0)
MCV: 81.6 fL (ref 80.0–100.0)
Monocytes Absolute: 0.8 10*3/uL (ref 0.1–1.0)
Monocytes Relative: 8 %
Neutro Abs: 1.8 10*3/uL (ref 1.7–7.7)
Neutrophils Relative %: 18 %
Platelets: 251 10*3/uL (ref 150–400)
RBC: 4.35 MIL/uL (ref 3.87–5.11)
RDW: 15.1 % (ref 11.5–15.5)
Smear Review: NORMAL
WBC Morphology: ABNORMAL
WBC: 9.9 10*3/uL (ref 4.0–10.5)
nRBC: 0 % (ref 0.0–0.2)

## 2023-04-03 ENCOUNTER — Inpatient Hospital Stay: Payer: Medicare Other

## 2023-04-03 ENCOUNTER — Encounter: Payer: Self-pay | Admitting: Oncology

## 2023-04-03 ENCOUNTER — Inpatient Hospital Stay: Payer: Medicare Other | Attending: Oncology | Admitting: Oncology

## 2023-04-03 VITALS — BP 131/78 | HR 78 | Temp 98.0°F | Resp 18 | Wt 193.2 lb

## 2023-04-03 DIAGNOSIS — Z881 Allergy status to other antibiotic agents status: Secondary | ICD-10-CM | POA: Insufficient documentation

## 2023-04-03 DIAGNOSIS — C9110 Chronic lymphocytic leukemia of B-cell type not having achieved remission: Secondary | ICD-10-CM

## 2023-04-03 DIAGNOSIS — Z833 Family history of diabetes mellitus: Secondary | ICD-10-CM | POA: Diagnosis not present

## 2023-04-03 DIAGNOSIS — Z803 Family history of malignant neoplasm of breast: Secondary | ICD-10-CM | POA: Insufficient documentation

## 2023-04-03 DIAGNOSIS — Z923 Personal history of irradiation: Secondary | ICD-10-CM | POA: Diagnosis not present

## 2023-04-03 DIAGNOSIS — D0511 Intraductal carcinoma in situ of right breast: Secondary | ICD-10-CM | POA: Diagnosis present

## 2023-04-03 DIAGNOSIS — Z1231 Encounter for screening mammogram for malignant neoplasm of breast: Secondary | ICD-10-CM

## 2023-04-03 DIAGNOSIS — Z86000 Personal history of in-situ neoplasm of breast: Secondary | ICD-10-CM

## 2023-04-03 DIAGNOSIS — Z885 Allergy status to narcotic agent status: Secondary | ICD-10-CM | POA: Diagnosis not present

## 2023-04-03 DIAGNOSIS — R5383 Other fatigue: Secondary | ICD-10-CM | POA: Diagnosis not present

## 2023-04-03 DIAGNOSIS — Z880 Allergy status to penicillin: Secondary | ICD-10-CM | POA: Insufficient documentation

## 2023-04-03 DIAGNOSIS — Z801 Family history of malignant neoplasm of trachea, bronchus and lung: Secondary | ICD-10-CM | POA: Insufficient documentation

## 2023-04-03 DIAGNOSIS — Z79899 Other long term (current) drug therapy: Secondary | ICD-10-CM | POA: Diagnosis not present

## 2023-04-03 DIAGNOSIS — Z9049 Acquired absence of other specified parts of digestive tract: Secondary | ICD-10-CM | POA: Insufficient documentation

## 2023-04-03 DIAGNOSIS — Z08 Encounter for follow-up examination after completed treatment for malignant neoplasm: Secondary | ICD-10-CM

## 2023-04-03 LAB — CBC WITH DIFFERENTIAL/PLATELET
Abs Immature Granulocytes: 0.01 10*3/uL (ref 0.00–0.07)
Basophils Absolute: 0 10*3/uL (ref 0.0–0.1)
Basophils Relative: 1 %
Eosinophils Absolute: 0.1 10*3/uL (ref 0.0–0.5)
Eosinophils Relative: 2 %
HCT: 35.9 % — ABNORMAL LOW (ref 36.0–46.0)
Hemoglobin: 11.6 g/dL — ABNORMAL LOW (ref 12.0–15.0)
Immature Granulocytes: 0 %
Lymphocytes Relative: 55 %
Lymphs Abs: 3.8 10*3/uL (ref 0.7–4.0)
MCH: 26.6 pg (ref 26.0–34.0)
MCHC: 32.3 g/dL (ref 30.0–36.0)
MCV: 82.3 fL (ref 80.0–100.0)
Monocytes Absolute: 0.7 10*3/uL (ref 0.1–1.0)
Monocytes Relative: 10 %
Neutro Abs: 2.2 10*3/uL (ref 1.7–7.7)
Neutrophils Relative %: 32 %
Platelets: 262 10*3/uL (ref 150–400)
RBC: 4.36 MIL/uL (ref 3.87–5.11)
RDW: 14.3 % (ref 11.5–15.5)
WBC: 6.9 10*3/uL (ref 4.0–10.5)
nRBC: 0 % (ref 0.0–0.2)

## 2023-04-03 NOTE — Progress Notes (Signed)
Hematology/Oncology Consult note [facility]  Telephone:([phone] [phone]  Patient Care Team: Lauro Regulus, MD as PCP - General (Internal Medicine) Kieth Brightly, MD (General Surgery) Schermerhorn, Ihor Austin, MD as Referring Physician (Obstetrics and Gynecology) Selmer Dominion Rance Muir, MD as Referring Physician (General Surgery)   Name of the patient: Joanna Phillips  [id number]  August 22, 1950   Date of visit: 04/03/23  Diagnosis- Rai stage I CLL History of right breast DCIS  Chief complaint/ Reason for visit-routine follow-up of CLL and DCIS  Heme/Onc history: Patient is a 72 year old female diagnosed with right breast DCIS ER positive in October 2017.Pathology showed 3.9 cm grade 3 DCISOn lumpectomy.  She completed adjuvant radiation treatment and started Arimidex In February 2018.  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  SPEP was normal.  Chest, abdomen, and pelvic CT scan revealed small diffuse nodes.  She has been under observation for her CLL and has not required any treatment so far.  Interval history-she is doing well for her age.  Has not had any recent hospitalizations.  Her husband is currently at a subacute rehab and she visits him every day.  ECOG PS- 2 Pain scale- 0   Review of systems- Review of Systems  Constitutional:  Positive for malaise/fatigue. Negative for chills, fever and weight loss.  HENT:  Negative for congestion, ear discharge and nosebleeds.   Eyes:  Negative for blurred vision.  Respiratory:  Negative for cough, hemoptysis, sputum production, shortness of breath and wheezing.   Cardiovascular:   Negative for chest pain, palpitations, orthopnea and claudication.  Gastrointestinal:  Negative for abdominal pain, blood in stool, constipation, diarrhea, heartburn, melena, nausea and vomiting.  Genitourinary:  Negative for dysuria, flank pain, frequency, hematuria and urgency.  Musculoskeletal:  Negative for back pain, joint pain and myalgias.  Skin:  Negative for rash.  Neurological:  Negative for dizziness, tingling, focal weakness, seizures, weakness and headaches.  Endo/Heme/Allergies:  Does not bruise/bleed easily.  Psychiatric/Behavioral:  Negative for depression and suicidal ideas. The patient does not have insomnia.       Allergies  Allergen Reactions   Amoxicillin Hives   Hydrocodone Itching   Sulfamethoxazole-Trimethoprim Nausea Only   Allopurinol Rash     Past Medical History:  Diagnosis Date   Arthritis    Breast cancer (HCC) 2017   DCIS   CLL (chronic lymphocytic leukemia) (HCC) 2011   DCIS (ductal carcinoma in situ) 03/11/2016   GERD (gastroesophageal reflux disease)    Hyponatremia    Personal history of radiation therapy 2017     Past Surgical History:  Procedure Laterality Date   BREAST BIOPSY Right 02/12/2016   DCIS   BREAST BIOPSY Right 12/27/2019   path pending, calcs, x marker   BREAST LUMPECTOMY Right 03/11/2016   DCIS neg margins   CHOLECYSTECTOMY  1996   COLONOSCOPY  2013   COLONOSCOPY WITH PROPOFOL N/A 04/19/2021   Procedure: COLONOSCOPY WITH PROPOFOL;  Surgeon: Jaynie Collins, DO;  Location: [facility] ENDOSCOPY;  Service: Gastroenterology;  Laterality: N/A;   squamous cell skin on scalp surgery Right     Social History   Socioeconomic History   Marital status: Married  Spouse name: Not on file   Number of children: Not on file   Years of education: Not on file   Highest education level: Not on file  Occupational History   Occupation: retired   Tobacco Use   Smoking status: Never   Smokeless tobacco: Never  Vaping Use    Vaping status: Never Used  Substance and Sexual Activity   Alcohol use: No   Drug use: No   Sexual activity: Not on file  Other Topics Concern   Not on file  Social History Narrative   Not on file   Social Determinants of Health   Financial Resource Strain: Low Risk  (10/17/2022)   Received from [facility]   Overall Financial Resource Strain (CARDIA)    Difficulty of Paying Living Expenses: Not hard at all  Food Insecurity: No Food Insecurity (10/17/2022)   [facility] from [facility]   Hunger Vital Sign    Worried About Running Out of Food in the Last Year: Never true    Ran Out of Food in the Last Year: Never true  Transportation Needs: No Transportation Needs (10/17/2022)   Received from [facility]    In the past 12 months, has lack of transportation kept you from medical appointments or from getting medications?: No    Lack of Transportation (Non-Medical): No  Physical Activity: Insufficiently Active (01/11/2019)   Exercise Vital Sign    Days of Exercise per Week: 3 days    Minutes of Exercise per Session: 30 min  Stress: No Stress Concern Present (01/11/2019)   [employer] of Occupational Health - Occupational Stress Questionnaire    Feeling of Stress : Not at all  Social Connections: Moderately Integrated (01/11/2019)   Social Connection and Isolation Panel [NHANES]    Frequency of Communication with Friends and Family: More than three times a week    Frequency of Social Gatherings with Friends and Family: More than three times a week    Attends Religious Services: More than 4 times per year    Active Member of [employer] or Organizations: No    Attends Banker Meetings: Never    Marital Status: Married  Catering manager Violence: Not At Risk (02/25/2022)   Humiliation, Afraid, Rape, and Kick questionnaire    Fear of Current or Ex-Partner: No    Emotionally Abused: No     Physically Abused: No    Sexually Abused: No    Family History  Problem Relation Age of Onset   Cancer Father        lung   Diabetes Mother    Breast cancer Maternal Aunt 60   Breast cancer Maternal Aunt 63     Current Outpatient Medications:    acetaminophen (TYLENOL) 650 MG CR tablet, Take by mouth., Disp: , Rfl:    aspirin EC 81 MG tablet, Take 81 mg by mouth daily., Disp: , Rfl:    calcium-vitamin D (OSCAL WITH D) 500-200 MG-UNIT TABS tablet, Take 1 tablet by mouth daily., Disp: , Rfl:    Carboxymethylcellulose Sodium (REFRESH CELLUVISC OP), Apply 1 drop to eye every 4 (four) hours as needed., Disp: , Rfl:    fluticasone (FLONASE) 50 MCG/ACT nasal spray, Place into the nose., Disp: , Rfl:    gabapentin (NEURONTIN) 800 MG tablet, Take 1 tablet (800 mg total) by mouth 3 (three) times daily. Home med., Disp: 270 tablet, Rfl: 1   omeprazole (PRILOSEC) 20 MG  capsule, Take 1 capsule by mouth once daily, Disp: 90 capsule, Rfl: 3   ondansetron (ZOFRAN-ODT) 4 MG disintegrating tablet, Take 4 mg by mouth every 8 (eight) hours as needed. (Patient not taking: Reported on 04/02/2022), Disp: , Rfl:    OXcarbazepine (TRILEPTAL) 150 MG tablet, Take 300 mg by mouth 2 (two) times daily., Disp: , Rfl:    vitamin B-12 (CYANOCOBALAMIN) 250 MCG tablet, Take 250 mcg by mouth daily., Disp: , Rfl:   Physical exam:  Vitals:   04/03/23 1017  BP: 131/78  Pulse: 78  Resp: 18  Temp: 98 F (36.7 C)  TempSrc: Tympanic  SpO2: 99%  Weight: 193 lb 3.2 oz (87.6 kg)   Physical Exam Cardiovascular:     Rate and Rhythm: Normal rate and regular rhythm.     Heart sounds: Normal heart sounds.  Pulmonary:     Effort: Pulmonary effort is normal.     Breath sounds: Normal breath sounds.  Abdominal:     General: Bowel sounds are normal.     Palpations: Abdomen is soft.     Comments: No palpable hepatosplenomegaly scar of prior cholecystectomy seen.  Lymphadenopathy:     Comments: No palpable cervical,  supraclavicular, axillary or inguinal adenopathy    Skin:    General: Skin is warm and dry.  Neurological:     Mental Status: She is alert and oriented to person, place, and time.    Breast exam was performed in seated and lying down position. Patient is status post right lumpectomy with a well-healed surgical scar. No evidence of any palpable masses. No evidence of axillary adenopathy. No evidence of any palpable masses or lumps in the left breast. No evidence of leftt axillary adenopathy      Latest Ref Rng & Units 10/01/2022    9:27 AM  CMP  Glucose 70 - 99 mg/dL 563   BUN 8 - 23 mg/dL 26   Creatinine 8.75 - 1.00 mg/dL 6.43   Sodium 329 - 518 mmol/L 128   Potassium 3.5 - 5.1 mmol/L 4.4   Chloride 98 - 111 mmol/L 95   CO2 22 - 32 mmol/L 25   Calcium 8.9 - 10.3 mg/dL 8.8   Total Protein 6.5 - 8.1 g/dL 6.6   Total Bilirubin 0.3 - 1.2 mg/dL 0.5   Alkaline Phos 38 - 126 U/L 83   AST 15 - 41 U/L 23   ALT 0 - 44 U/L 18       Latest Ref Rng & Units 04/03/2023    9:24 AM  CBC  WBC 4.0 - 10.5 K/uL 6.9   Hemoglobin 12.0 - 15.0 g/dL 84.1   Hematocrit 66.0 - 46.0 % 35.9   Platelets 150 - 400 K/uL 262      Assessment and plan- Patient is a 72 y.o. female who is here for follow-up of following issues:  Rai stage 0 CLL: White cell count is normal and platelets are normal.  Hemoglobin fluctuates between 11-12 which is at her baseline.  Clinically patient does not have any B symptoms or palpable adenopathy or splenomegaly.  CLL does not require any treatment at this time.  I will see her back in 1 year.  Labs to be checked also in 6 months  History of right breast DCIS: She is now 6 years out of diagnosis.  Breast exam today is unremarkable.  She will be due for her mammogram in August 2025.  Mammogram from this year was unremarkable   Visit Diagnosis  1. Encounter for follow-up surveillance of ductal carcinoma in situ (DCIS) of breast   2. CLL (chronic lymphocytic leukemia) (HCC)    3. Encounter for screening mammogram for malignant neoplasm of breast      Dr. Owens Shark, MD, MPH [facility] at [facility] [phone] 04/03/2023 12:24 PM

## 2023-04-06 ENCOUNTER — Encounter (INDEPENDENT_AMBULATORY_CARE_PROVIDER_SITE_OTHER): Payer: Medicare Other | Admitting: Vascular Surgery

## 2023-05-07 ENCOUNTER — Encounter (INDEPENDENT_AMBULATORY_CARE_PROVIDER_SITE_OTHER): Payer: Self-pay | Admitting: Vascular Surgery

## 2023-05-07 ENCOUNTER — Ambulatory Visit (INDEPENDENT_AMBULATORY_CARE_PROVIDER_SITE_OTHER): Payer: Medicare Other | Admitting: Vascular Surgery

## 2023-05-07 VITALS — BP 135/83 | HR 77 | Resp 16 | Wt 188.0 lb

## 2023-05-07 DIAGNOSIS — M79604 Pain in right leg: Secondary | ICD-10-CM

## 2023-05-07 DIAGNOSIS — I890 Lymphedema, not elsewhere classified: Secondary | ICD-10-CM

## 2023-05-07 DIAGNOSIS — M1710 Unilateral primary osteoarthritis, unspecified knee: Secondary | ICD-10-CM | POA: Diagnosis not present

## 2023-05-07 DIAGNOSIS — K219 Gastro-esophageal reflux disease without esophagitis: Secondary | ICD-10-CM | POA: Diagnosis not present

## 2023-05-07 DIAGNOSIS — M79605 Pain in left leg: Secondary | ICD-10-CM

## 2023-05-07 DIAGNOSIS — S8012XS Contusion of left lower leg, sequela: Secondary | ICD-10-CM | POA: Insufficient documentation

## 2023-05-07 NOTE — Progress Notes (Signed)
MRN : [id number]  Joanna Phillips is a 72 y.o. (March 02, 1951) female who presents with chief complaint of legs swell.  History of Present Illness:   The patient returns to the office for followup evaluation regarding leg swelling associated with a resolving hematoma.  She sustained a hematoma back in September.  Her concern regarding her evaluation today is that it has not completely resolved and is still hard and firm area in the medial calf which is mildly tender particularly with prolonged standing.  Overall the swelling/lymphedema has has been stable and the pain associated with swelling has decreased. There have not been any interval development of a ulcerations or wounds.  Since the previous visit the patient has been wearing graduated compression stockings and has noted some improvement in the lymphedema. The patient has been using compression routinely morning until night.  The patient also states elevation during the day and exercise (such as walking) is being done too.    Current Meds  Medication Sig   acetaminophen (TYLENOL) 650 MG CR tablet Take by mouth.   aspirin EC 81 MG tablet Take 81 mg by mouth daily.   calcium-vitamin D (OSCAL WITH D) 500-200 MG-UNIT TABS tablet Take 1 tablet by mouth daily.   Carboxymethylcellulose Sodium (REFRESH CELLUVISC OP) Apply 1 drop to eye every 4 (four) hours as needed.   fluticasone (FLONASE) 50 MCG/ACT nasal spray Place into the nose.   gabapentin (NEURONTIN) 800 MG tablet Take 1 tablet (800 mg total) by mouth 3 (three) times daily. Home med.   omeprazole (PRILOSEC) 20 MG capsule Take 1 capsule by mouth once daily   OXcarbazepine (TRILEPTAL) 150 MG tablet Take 300 mg by mouth 2 (two) times daily.   vitamin B-12 (CYANOCOBALAMIN) 250 MCG tablet Take 250 mcg by mouth daily.    Past Medical History:  Diagnosis Date   Arthritis    Breast cancer (HCC) 2017   DCIS   CLL (chronic lymphocytic leukemia) (HCC) 2011    DCIS (ductal carcinoma in situ) 03/11/2016   GERD (gastroesophageal reflux disease)    Hyponatremia    Personal history of radiation therapy 2017    Past Surgical History:  Procedure Laterality Date   BREAST BIOPSY Right 02/12/2016   DCIS   BREAST BIOPSY Right 12/27/2019   path pending, calcs, x marker   BREAST LUMPECTOMY Right 03/11/2016   DCIS neg margins   CHOLECYSTECTOMY  1996   COLONOSCOPY  2013   COLONOSCOPY WITH PROPOFOL N/A 04/19/2021   Procedure: COLONOSCOPY WITH PROPOFOL;  Surgeon: Jaynie Collins, DO;  Location: [facility] ENDOSCOPY;  Service: Gastroenterology;  Laterality: N/A;   squamous cell skin on scalp surgery Right     Social History Social History   Tobacco Use   Smoking status: Never   Smokeless tobacco: Never  Vaping Use   Vaping status: Never Used  Substance Use Topics   Alcohol use: No   Drug use: No    Family History Family History  Problem Relation Age of Onset   Cancer Father        lung   Diabetes Mother    Breast cancer Maternal Aunt 60   Breast cancer Maternal Aunt 19    Allergies  Allergen Reactions   Amoxicillin Hives   Hydrocodone Itching   Sulfamethoxazole-Trimethoprim Nausea Only   Allopurinol Rash  REVIEW OF SYSTEMS (Negative unless checked)  Constitutional: [] Weight loss  [] Fever  [] Chills Cardiac: [] Chest pain   [] Chest pressure   [] Palpitations   [] Shortness of breath when laying flat   [] Shortness of breath with exertion. Vascular:  [] Pain in legs with walking   [x] Pain in legs with standing  [] History of DVT   [] Phlebitis   [x] Swelling in legs   [] Varicose veins   [] Non-healing ulcers Pulmonary:   [] Uses home oxygen   [] Productive cough   [] Hemoptysis   [] Wheeze  [] COPD   [] Asthma Neurologic:  [] Dizziness   [] Seizures   [] History of stroke   [] History of TIA  [] Aphasia   [] Vissual changes   [] Weakness or numbness in arm   [] Weakness or numbness in leg Musculoskeletal:   [] Joint swelling   [] Joint pain   [] Low back  pain Hematologic:  [] Easy bruising  [] Easy bleeding   [] Hypercoagulable state   [] Anemic Gastrointestinal:  [] Diarrhea   [] Vomiting  [] Gastroesophageal reflux/heartburn   [] Difficulty swallowing. Genitourinary:  [] Chronic kidney disease   [] Difficult urination  [] Frequent urination   [] Blood in urine Skin:  [] Rashes   [] Ulcers  Psychological:  [] History of anxiety   []  History of major depression.  Physical Examination  Vitals:   05/07/23 1505  BP: 135/83  Pulse: 77  Resp: 16  Weight: 188 lb (85.3 kg)   Body mass index is 32.27 kg/m. Gen: WD/WN, NAD Head: Delta/AT, No temporalis wasting.  Ear/Nose/Throat: Hearing grossly intact, nares w/o erythema or drainage, pinna without lesions Eyes: PER, EOMI, sclera nonicteric.  Neck: Supple, no gross masses.  No JVD.  Pulmonary:  Good air movement, no audible wheezing, no use of accessory muscles.  Cardiac: RRR, precordium not hyperdynamic. Vascular:  scattered varicosities present bilaterally.  Moderate venous stasis changes to the legs bilaterally.  2+ firm pitting edema, on the left leg in the proximal medial calf there are changes consistent with a resolving hematoma CEAP [id number]  Vessel Right Left  Radial Palpable Palpable  Gastrointestinal: soft, non-distended. No guarding/no peritoneal signs.  Musculoskeletal: M/S 5/5 throughout.  No deformity.  Neurologic: CN 2-12 intact. Pain and light touch intact in extremities.  Symmetrical.  Speech is fluent. Motor exam as listed above. Psychiatric: Judgment intact, Mood & affect appropriate for pt's clinical situation. Dermatologic: Venous rashes no ulcers noted.  No changes consistent with cellulitis. Lymph : No lichenification or skin changes of chronic lymphedema.  CBC Lab Results  Component Value Date   WBC 6.9 04/03/2023   HGB 11.6 (L) 04/03/2023   HCT 35.9 (L) 04/03/2023   MCV 82.3 04/03/2023   PLT 262 04/03/2023    BMET    Component Value Date/Time   NA 128 (L) 10/01/2022  0927   NA 141 02/13/2016 1049   K 4.4 10/01/2022 0927   CL 95 (L) 10/01/2022 0927   CO2 25 10/01/2022 0927   GLUCOSE 110 (H) 10/01/2022 0927   BUN 26 (H) 10/01/2022 0927   BUN 18 02/13/2016 1049   CREATININE 0.78 10/01/2022 0927   CALCIUM 8.8 (L) 10/01/2022 0927   GFRNONAA >60 10/01/2022 0927   GFRAA >60 02/06/2020 0957   CrCl cannot be calculated (Patient's most recent lab result is older than the maximum 21 days allowed.).  COAG Lab Results  Component Value Date   INR 1.3 (H) 02/25/2022    Radiology No results found.   Assessment/Plan 1. Hematoma of leg, left, sequela (Primary) For hematoma is certainly improving although it is is a painfully slow process.  I reassured her that this is a typical course.  I did describe that at this point all the things she can do to maintain her lymphedema and treat her venous insufficiency are good for resolution of her hematoma including compression, elevation and using a lymph pump.  I also said that she could experiment with hot or cold compresses.  At this point which ever makes her leg feel more comfortable is what should be utilized.  She will return if she develops further concerns.  2. Lymphedema Recommend:  No surgery or intervention at this point in time.    I have reviewed my discussion with the patient regarding lymphedema and why it  causes symptoms.  Patient will continue wearing graduated compression on a daily basis. The patient should put the compression on first thing in the morning and removing them in the evening. The patient should not sleep in the compression.   In addition, behavioral modification throughout the day will be continued.  This will include frequent elevation (such as in a recliner), use of over the counter pain medications as needed and exercise such as walking.  The systemic causes for chronic edema such as liver, kidney and cardiac etiologies does not appear to have significant changed over the past  year.    The patient will continue aggressive use of the  lymph pump.  This will continue to improve the edema control and prevent sequela such as ulcers and infections.    3. Gastroesophageal reflux disease without esophagitis Continue PPI as already ordered, this medication has been reviewed and there are no changes at this time.  Avoidence of caffeine and alcohol  Moderate elevation of the head of the bed   4. Pain in both lower extremities See #1 #2  5. Arthritis of knee Continue medications to treat the patient's degenerative disease as already ordered, these medications have been reviewed and there are no changes at this time.  Continued activity and therapy was stressed.    Levora Dredge, MD  05/07/2023 4:30 PM

## 2023-08-13 ENCOUNTER — Inpatient Hospital Stay
Admission: EM | Admit: 2023-08-13 | Discharge: 2023-08-17 | DRG: 603 | Disposition: A | Discharge status: Home Health | Attending: Hospitalist | Admitting: Hospitalist

## 2023-08-13 ENCOUNTER — Emergency Department

## 2023-08-13 ENCOUNTER — Encounter: Payer: Self-pay | Admitting: *Deleted

## 2023-08-13 ENCOUNTER — Other Ambulatory Visit: Payer: Self-pay

## 2023-08-13 DIAGNOSIS — K219 Gastro-esophageal reflux disease without esophagitis: Secondary | ICD-10-CM | POA: Diagnosis present

## 2023-08-13 DIAGNOSIS — Z833 Family history of diabetes mellitus: Secondary | ICD-10-CM | POA: Diagnosis not present

## 2023-08-13 DIAGNOSIS — Z882 Allergy status to sulfonamides status: Secondary | ICD-10-CM

## 2023-08-13 DIAGNOSIS — Z803 Family history of malignant neoplasm of breast: Secondary | ICD-10-CM | POA: Diagnosis not present

## 2023-08-13 DIAGNOSIS — Z888 Allergy status to other drugs, medicaments and biological substances status: Secondary | ICD-10-CM | POA: Diagnosis not present

## 2023-08-13 DIAGNOSIS — Z86000 Personal history of in-situ neoplasm of breast: Secondary | ICD-10-CM | POA: Diagnosis not present

## 2023-08-13 DIAGNOSIS — M5430 Sciatica, unspecified side: Secondary | ICD-10-CM | POA: Diagnosis present

## 2023-08-13 DIAGNOSIS — L02612 Cutaneous abscess of left foot: Principal | ICD-10-CM | POA: Diagnosis present

## 2023-08-13 DIAGNOSIS — R7303 Prediabetes: Secondary | ICD-10-CM | POA: Diagnosis present

## 2023-08-13 DIAGNOSIS — Z923 Personal history of irradiation: Secondary | ICD-10-CM

## 2023-08-13 DIAGNOSIS — E871 Hypo-osmolality and hyponatremia: Secondary | ICD-10-CM | POA: Diagnosis present

## 2023-08-13 DIAGNOSIS — L97529 Non-pressure chronic ulcer of other part of left foot with unspecified severity: Secondary | ICD-10-CM | POA: Diagnosis present

## 2023-08-13 DIAGNOSIS — Z853 Personal history of malignant neoplasm of breast: Secondary | ICD-10-CM

## 2023-08-13 DIAGNOSIS — Z7982 Long term (current) use of aspirin: Secondary | ICD-10-CM | POA: Diagnosis not present

## 2023-08-13 DIAGNOSIS — S91302A Unspecified open wound, left foot, initial encounter: Secondary | ICD-10-CM | POA: Diagnosis not present

## 2023-08-13 DIAGNOSIS — C9110 Chronic lymphocytic leukemia of B-cell type not having achieved remission: Secondary | ICD-10-CM | POA: Diagnosis present

## 2023-08-13 DIAGNOSIS — I890 Lymphedema, not elsewhere classified: Secondary | ICD-10-CM | POA: Diagnosis present

## 2023-08-13 DIAGNOSIS — Z880 Allergy status to penicillin: Secondary | ICD-10-CM

## 2023-08-13 DIAGNOSIS — Z79899 Other long term (current) drug therapy: Secondary | ICD-10-CM | POA: Diagnosis not present

## 2023-08-13 DIAGNOSIS — Z885 Allergy status to narcotic agent status: Secondary | ICD-10-CM | POA: Diagnosis not present

## 2023-08-13 DIAGNOSIS — L03116 Cellulitis of left lower limb: Secondary | ICD-10-CM | POA: Diagnosis present

## 2023-08-13 LAB — COMPREHENSIVE METABOLIC PANEL WITH GFR
ALT: 15 U/L (ref 0–44)
AST: 21 U/L (ref 15–41)
Albumin: 4.2 g/dL (ref 3.5–5.0)
Alkaline Phosphatase: 101 U/L (ref 38–126)
Anion gap: 8 (ref 5–15)
BUN: 28 mg/dL — ABNORMAL HIGH (ref 8–23)
CO2: 25 mmol/L (ref 22–32)
Calcium: 9.2 mg/dL (ref 8.9–10.3)
Chloride: 102 mmol/L (ref 98–111)
Creatinine, Ser: 0.83 mg/dL (ref 0.44–1.00)
GFR, Estimated: >60 mL/min (ref >60)
Glucose, Bld: 114 mg/dL — ABNORMAL HIGH (ref 70–99)
Potassium: 4.7 mmol/L (ref 3.5–5.1)
Sodium: 135 mmol/L (ref 135–145)
Total Bilirubin: 0.7 mg/dL (ref 0.0–1.2)
Total Protein: 7 g/dL (ref 6.5–8.1)

## 2023-08-13 LAB — CBC WITH DIFFERENTIAL/PLATELET
Abs Immature Granulocytes: 0.02 10*3/uL (ref 0.00–0.07)
Basophils Absolute: 0.1 10*3/uL (ref 0.0–0.1)
Basophils Relative: 0 %
Eosinophils Absolute: 0.2 10*3/uL (ref 0.0–0.5)
Eosinophils Relative: 2 %
HCT: 39 % (ref 36.0–46.0)
Hemoglobin: 12.4 g/dL (ref 12.0–15.0)
Immature Granulocytes: 0 %
Lymphocytes Relative: 61 %
Lymphs Abs: 7.4 10*3/uL — ABNORMAL HIGH (ref 0.7–4.0)
MCH: 26.8 pg (ref 26.0–34.0)
MCHC: 31.8 g/dL (ref 30.0–36.0)
MCV: 84.4 fL (ref 80.0–100.0)
Monocytes Absolute: 0.7 10*3/uL (ref 0.1–1.0)
Monocytes Relative: 6 %
Neutro Abs: 3.7 10*3/uL (ref 1.7–7.7)
Neutrophils Relative %: 31 %
Platelets: 246 10*3/uL (ref 150–400)
RBC: 4.62 MIL/uL (ref 3.87–5.11)
RDW: 14.1 % (ref 11.5–15.5)
Smear Review: NORMAL
WBC: 12.1 10*3/uL — ABNORMAL HIGH (ref 4.0–10.5)
nRBC: 0 % (ref 0.0–0.2)

## 2023-08-13 LAB — SEDIMENTATION RATE: Sed Rate: 6 mm/h (ref 0–30)

## 2023-08-13 LAB — C-REACTIVE PROTEIN: CRP: 0.9 mg/dL (ref <1.0)

## 2023-08-13 LAB — PREALBUMIN: Prealbumin: 19 mg/dL (ref 18–38)

## 2023-08-13 LAB — PATHOLOGIST SMEAR REVIEW

## 2023-08-13 LAB — LACTIC ACID, PLASMA: Lactic Acid, Venous: 0.9 mmol/L (ref 0.5–1.9)

## 2023-08-13 LAB — GLUCOSE, CAPILLARY: Glucose-Capillary: 132 mg/dL — ABNORMAL HIGH (ref 70–99)

## 2023-08-13 MED ORDER — SODIUM CHLORIDE 0.9 % IV SOLN: INTRAVENOUS | Status: AC

## 2023-08-13 MED ORDER — SODIUM CHLORIDE 0.9 % IV SOLN
2.0000 g | Freq: Three times a day (TID) | INTRAVENOUS | Status: DC
  Administered 2023-08-13 – 2023-08-17 (×11): 2 g via INTRAVENOUS
  Filled 2023-08-13 (×12): qty 12.5

## 2023-08-13 MED ORDER — INSULIN ASPART 100 UNIT/ML IJ SOLN: 0.0000 [IU] | Freq: Three times a day (TID) | INTRAMUSCULAR | Status: DC

## 2023-08-13 MED ORDER — ENOXAPARIN SODIUM 40 MG/0.4ML IJ SOSY
40.0000 mg | PREFILLED_SYRINGE | INTRAMUSCULAR | Status: DC
  Administered 2023-08-13 – 2023-08-16 (×4): 40 mg via SUBCUTANEOUS
  Filled 2023-08-13 (×4): qty 0.4

## 2023-08-13 MED ORDER — ONDANSETRON HCL 4 MG PO TABS: 4.0000 mg | ORAL_TABLET | Freq: Four times a day (QID) | ORAL | Status: DC | PRN

## 2023-08-13 MED ORDER — SODIUM CHLORIDE 0.9 % IV SOLN
2.0000 g | Freq: Once | INTRAVENOUS | Status: AC
  Administered 2023-08-13: 2 g via INTRAVENOUS
  Filled 2023-08-13: qty 12.5

## 2023-08-13 MED ORDER — VANCOMYCIN HCL 1750 MG/350ML IV SOLN
1750.0000 mg | Freq: Once | INTRAVENOUS | Status: AC
  Administered 2023-08-13: 1750 mg via INTRAVENOUS
  Filled 2023-08-13 (×2): qty 350

## 2023-08-13 MED ORDER — OXCARBAZEPINE 300 MG PO TABS
300.0000 mg | ORAL_TABLET | Freq: Two times a day (BID) | ORAL | Status: DC
  Administered 2023-08-13 – 2023-08-17 (×8): 300 mg via ORAL
  Filled 2023-08-13 (×8): qty 1

## 2023-08-13 MED ORDER — PANTOPRAZOLE SODIUM 40 MG PO TBEC
40.0000 mg | DELAYED_RELEASE_TABLET | Freq: Every day | ORAL | Status: DC
  Administered 2023-08-13 – 2023-08-17 (×5): 40 mg via ORAL
  Filled 2023-08-13 (×5): qty 1

## 2023-08-13 MED ORDER — ACETAMINOPHEN 325 MG PO TABS
650.0000 mg | ORAL_TABLET | Freq: Four times a day (QID) | ORAL | Status: DC | PRN
  Administered 2023-08-13 – 2023-08-17 (×9): 650 mg via ORAL
  Filled 2023-08-13 (×9): qty 2

## 2023-08-13 MED ORDER — VANCOMYCIN HCL 1500 MG/300ML IV SOLN
1500.0000 mg | INTRAVENOUS | Status: DC
  Administered 2023-08-14 – 2023-08-16 (×3): 1500 mg via INTRAVENOUS
  Filled 2023-08-13 (×3): qty 300

## 2023-08-13 MED ORDER — ONDANSETRON HCL 4 MG/2ML IJ SOLN
4.0000 mg | Freq: Four times a day (QID) | INTRAMUSCULAR | Status: DC | PRN
  Administered 2023-08-14: 4 mg via INTRAVENOUS

## 2023-08-13 MED ORDER — METRONIDAZOLE 500 MG/100ML IV SOLN
500.0000 mg | Freq: Two times a day (BID) | INTRAVENOUS | Status: DC
  Administered 2023-08-14 – 2023-08-17 (×7): 500 mg via INTRAVENOUS
  Filled 2023-08-13 (×7): qty 100

## 2023-08-13 MED ORDER — METRONIDAZOLE 500 MG/100ML IV SOLN
500.0000 mg | Freq: Once | INTRAVENOUS | Status: AC
Start: 2023-08-13 — End: 2023-08-13
  Administered 2023-08-13: 500 mg via INTRAVENOUS
  Filled 2023-08-13: qty 100

## 2023-08-13 MED ORDER — GABAPENTIN 400 MG PO CAPS
800.0000 mg | ORAL_CAPSULE | Freq: Three times a day (TID) | ORAL | Status: DC
  Administered 2023-08-13 – 2023-08-17 (×10): 800 mg via ORAL
  Filled 2023-08-13 (×2): qty 2
  Filled 2023-08-13 (×2): qty 8
  Filled 2023-08-13 (×3): qty 2
  Filled 2023-08-13: qty 8
  Filled 2023-08-13 (×7): qty 2
  Filled 2023-08-13: qty 8
  Filled 2023-08-13: qty 2
  Filled 2023-08-13: qty 8

## 2023-08-13 NOTE — ED Provider Notes (Signed)
 [facility] Provider Note    None    (approximate)   History   Foot Problem   HPI  Joanna Phillips is a 73 y.o. female with left foot wound.  I reviewed the note from podiatry today.  Patient has an abscess of the left foot.  She has been on doxycycline and then doxycycline Cipro not doxycycline.  She continues to have worsening pain, discharge from it.  She denies any known history of diabetes.  She states that she was told to come in for MRI, surgery.   Physical Exam   Triage Vital Signs: ED Triage Vitals  Encounter Vitals Group     BP 08/13/23 1113 (!) 154/81     Systolic BP Percentile --      Diastolic BP Percentile --      Pulse Rate 08/13/23 1113 75     Resp 08/13/23 1113 16     Temp 08/13/23 1113 98.3 F (36.8 C)     Temp Source 08/13/23 1113 Oral     SpO2 08/13/23 1113 100 %     Weight 08/13/23 1110 194 lb (88 kg)     Height --      Head Circumference --      Peak Flow --      Pain Score 08/13/23 1110 3     Pain Loc --      Pain Education --      Exclude from Growth Chart --     Most recent vital signs: Vitals:   08/13/23 1113  BP: (!) 154/81  Pulse: 75  Resp: 16  Temp: 98.3 F (36.8 C)  SpO2: 100%     General: Awake, no distress.  CV:  Good peripheral perfusion.  Resp:  Normal effort.  Abd:  No distention.  Other:  Patient has raised area on the left foot above the second digit.  Good distal pulse.   ED Results / Procedures / Treatments   Labs (all labs ordered are listed, but only abnormal results are displayed) Labs Reviewed  CULTURE, BLOOD (ROUTINE X 2)  CULTURE, BLOOD (ROUTINE X 2)  LACTIC ACID, PLASMA  LACTIC ACID, PLASMA  COMPREHENSIVE METABOLIC PANEL WITH GFR  CBC WITH DIFFERENTIAL/PLATELET  CBC WITH DIFFERENTIAL/PLATELET    PROCEDURES:  Critical Care performed: No  Procedures   MEDICATIONS ORDERED IN ED: Medications  metroNIDAZOLE (FLAGYL) IVPB 500 mg (has no administration in time range)   vancomycin (VANCOREADY) IVPB 1750 mg/350 mL (has no administration in time range)  ceFEPIme (MAXIPIME) 2 g in sodium chloride 0.9 % 100 mL IVPB (has no administration in time range)     IMPRESSION / MDM / ASSESSMENT AND PLAN / ED COURSE  I reviewed the triage vital signs and the nursing notes.   Patient's presentation is most consistent with acute presentation with potential threat to life or bodily function.   Patient comes in with concerns for foot infection has been ongoing since January concerning for abscess, cellulitis, osteo.  MRI was ordered per podiatry recommendation.  Patient does not meet sepsis criteria.  Discussed the case with Dr. Ether Griffins who did want patient to be admitted.  Do not see evidence of vascular acute issue.  Discussed with Ether Griffins- recommend MRI, IV antibioptics, and admission.  Sespsis alert not called does not meet criteria  The patient is on the cardiac monitor to evaluate for evidence of arrhythmia and/or significant heart rate changes.      FINAL CLINICAL IMPRESSION(S) / ED DIAGNOSES  Final diagnoses:  Foot abscess, left     Rx / DC Orders   ED Discharge Orders     None        Note:  This document was prepared using Dragon voice recognition software and may include unintentional dictation errors.   Concha Se, MD 08/13/23 1346

## 2023-08-13 NOTE — Assessment & Plan Note (Signed)
 PPI ?

## 2023-08-13 NOTE — Assessment & Plan Note (Signed)
 Followed by Dr. Gilda Crease outpatient  At baseline  Monitor

## 2023-08-13 NOTE — H&P (Addendum)
 History and Physical    Patient: Joanna Phillips YQM:[id number] DOB: [birth year] DOA: 08/13/2023 DOS: the patient was seen and examined on 08/13/2023 PCP: Lauro Regulus, MD  Patient coming from: Home  Chief Complaint:  Chief Complaint  Patient presents with   Foot Problem   HPI: Joanna Phillips is a 73 y.o. female with medical history significant of CLL, GERD, hx/o breast cancer, L foot wound presenting w/ L foot infection.  Patient reports initial diagnosis of left foot infection from February of this year.  Unclear if this was traumatic in etiology.  Has been followed by Dr. Ether Griffins for treatment.  Patient reports having course of antibiotics for treatment that has not helped.  No fevers or chills.  No nausea or vomiting.  Denies any known history of diabetes though has diagnosis of prediabetes documentation.  Non-smoker.  No alcohol abuse.  States that she has had worsening redness pain and swelling.  Was evaluated by podiatry earlier today with recommendation for come to the ER for further evaluation including MRI of the foot. Presented to the ER afebrile, hemodynamically stable.  Satting well on room air.  White count 12, hemoglobin 12.4, platelets 246, lactate 1.9, creatinine 0.83.  Left foot plain films within normal limits.  MRI of the foot pending. Review of Systems: As mentioned in the history of present illness. All other systems reviewed and are negative. Past Medical History:  Diagnosis Date   Arthritis    Breast cancer (HCC) 2017   DCIS   CLL (chronic lymphocytic leukemia) (HCC) 2011   DCIS (ductal carcinoma in situ) 03/11/2016   GERD (gastroesophageal reflux disease)    Hyponatremia    Personal history of radiation therapy 2017   Past Surgical History:  Procedure Laterality Date   BREAST BIOPSY Right 02/12/2016   DCIS   BREAST BIOPSY Right 12/27/2019   path pending, calcs, x marker   BREAST LUMPECTOMY Right 03/11/2016   DCIS neg margins   CHOLECYSTECTOMY  1996    COLONOSCOPY  2013   COLONOSCOPY WITH PROPOFOL N/A 04/19/2021   Procedure: COLONOSCOPY WITH PROPOFOL;  Surgeon: Jaynie Collins, DO;  Location: [facility] ENDOSCOPY;  Service: Gastroenterology;  Laterality: N/A;   squamous cell skin on scalp surgery Right    Social History:  reports that she has never smoked. She has never used smokeless tobacco. She reports that she does not drink alcohol and does not use drugs.  Allergies  Allergen Reactions   Amoxicillin Hives   Hydrocodone Itching   Sulfamethoxazole-Trimethoprim Nausea Only   Allopurinol Rash    Family History  Problem Relation Age of Onset   Cancer Father        lung   Diabetes Mother    Breast cancer Maternal Aunt 77   Breast cancer Maternal Aunt 69    Prior to Admission medications   Medication Sig Start Date End Date Taking? Authorizing Provider  acetaminophen (TYLENOL) 650 MG CR tablet Take by mouth.    [provider]  aspirin EC 81 MG tablet Take 81 mg by mouth daily.    [provider]  calcium-vitamin D (OSCAL WITH D) 500-200 MG-UNIT TABS tablet Take 1 tablet by mouth daily.    [provider]  Carboxymethylcellulose Sodium (REFRESH CELLUVISC OP) Apply 1 drop to eye every 4 (four) hours as needed.    [provider]  fluticasone (FLONASE) 50 MCG/ACT nasal spray Place into the nose. 09/10/20   [provider]  gabapentin (NEURONTIN) 800 MG tablet  Take 1 tablet (800 mg total) by mouth 3 (three) times daily. Home med. 02/27/22   Darlin Priestly, MD  omeprazole (PRILOSEC) 20 MG capsule Take 1 capsule by mouth once daily 01/11/19   Galen Manila, NP  ondansetron (ZOFRAN-ODT) 4 MG disintegrating tablet Take 4 mg by mouth every 8 (eight) hours as needed. Patient not taking: Reported on 05/07/2023 02/27/22   [provider]  OXcarbazepine (TRILEPTAL) 150 MG tablet Take 300 mg by mouth 2 (two) times daily. 08/21/21   [provider]  vitamin B-12 (CYANOCOBALAMIN) 250  MCG tablet Take 250 mcg by mouth daily.    [provider]    Physical Exam: Vitals:   08/13/23 1110 08/13/23 1113 08/13/23 1343 08/13/23 1518  BP:  (!) 154/81  (!) 150/78  Pulse:  75  70  Resp:  16  16  Temp:  98.3 F (36.8 C)  98 F (36.7 C)  TempSrc:  Oral  Oral  SpO2:  100%  100%  Weight: 88 kg  87.9 kg   Height:   5\' 4"  (1.626 m)    Physical Exam Constitutional:      Appearance: She is normal weight.  HENT:     Head: Normocephalic and atraumatic.  Eyes:     Pupils: Pupils are equal, round, and reactive to light.  Cardiovascular:     Rate and Rhythm: Normal rate and regular rhythm.  Pulmonary:     Effort: Pulmonary effort is normal.  Abdominal:     General: Bowel sounds are normal.  Musculoskeletal:        General: Normal range of motion.  Skin:    Comments: See picture  Neurological:     General: No focal deficit present.  Psychiatric:        Mood and Affect: Mood normal.     Data Reviewed:  There are no new results to review at this time.  DG Foot Complete Left CLINICAL DATA:  Infection and dorsal surface of left foot since 06/2023. Antibiotics are not helped. Symptoms have worsened. Dressing in place.  EXAM: LEFT FOOT - COMPLETE 3+ VIEW  COMPARISON:  None Available.  FINDINGS: There is diffuse decreased bone mineralization. Small plantar and posterior calcaneal heel spurs. No soft tissue ulcer or subcutaneous air is seen. No cortical erosion. No acute fracture or dislocation.  IMPRESSION: 1. No radiographic evidence of osteomyelitis. 2. Small plantar and posterior calcaneal heel spurs.  Electronically Signed   By: Neita Garnet M.D.   On: 08/13/2023 12:33  Lab Results  Component Value Date   WBC 12.1 (H) 08/13/2023   HGB 12.4 08/13/2023   HCT 39.0 08/13/2023   MCV 84.4 08/13/2023   PLT 246 08/13/2023   Last metabolic panel Lab Results  Component Value Date   GLUCOSE 114 (H) 08/13/2023   NA 135 08/13/2023   K 4.7  08/13/2023   CL 102 08/13/2023   CO2 25 08/13/2023   BUN 28 (H) 08/13/2023   CREATININE 0.83 08/13/2023   GFRNONAA >60 08/13/2023   CALCIUM 9.2 08/13/2023   PROT 7.0 08/13/2023   ALBUMIN 4.2 08/13/2023   LABGLOB 5.5 (H) 08/06/2017   AGRATIO 1.2 01/23/2016   BILITOT 0.7 08/13/2023   ALKPHOS 101 08/13/2023   AST 21 08/13/2023   ALT 15 08/13/2023   ANIONGAP 8 08/13/2023    Assessment and Plan: * Wound of left foot Worsening left foot abscess with failed outpatient course of oral antibiotics with podiatry (Dr. Ether Griffins)  MRI of the left  foot pending Will place on broad-spectrum antibiotics including IV cefepime, Flagyl vancomycin for infectious coverage Blood cultures drawn Follow-up podiatry recommendations  Lymphedema Followed by Dr. Gilda Crease outpatient  At baseline  Monitor   Sciatica Cont neurontin    Prediabetes Blood sugar 110s to 130s over the past year SSI A1c Monitor  GERD (gastroesophageal reflux disease) PPI  CLL (chronic lymphocytic leukemia) (HCC) Followed by Dr. Smith Robert outpatient  Stable  Monitor       Advance Care Planning:   Code Status: Full Code    Consults: Podiatry   Family Communication: Husband at the bedside   Severity of Illness: The appropriate patient status for this patient is INPATIENT. Inpatient status is judged to be reasonable and necessary in order to provide the required intensity of service to ensure the patient's safety. The patient's presenting symptoms, physical exam findings, and initial radiographic and laboratory data in the context of their chronic comorbidities is felt to place them at high risk for further clinical deterioration. Furthermore, it is not anticipated that the patient will be medically stable for discharge from the hospital within 2 midnights of admission.   * I certify that at the point of admission it is my clinical judgment that the patient will require inpatient hospital care spanning beyond 2 midnights  from the point of admission due to high intensity of service, high risk for further deterioration and high frequency of surveillance required.*  Author: Floydene Flock, MD 08/13/2023 4:01 PM  For on call review [email or web address].

## 2023-08-13 NOTE — Assessment & Plan Note (Signed)
 Cont  neurontin

## 2023-08-13 NOTE — Assessment & Plan Note (Addendum)
 Worsening left foot abscess with failed outpatient course of oral antibiotics with podiatry (Dr. Ether Griffins)  MRI of the left foot pending Will place on broad-spectrum antibiotics including IV cefepime, Flagyl vancomycin for infectious coverage Blood cultures drawn Follow-up podiatry recommendations

## 2023-08-13 NOTE — Progress Notes (Signed)
 ED Pharmacy Antibiotic Sign Off  An antibiotic consult was received from an ED provider for vancomycin and cefepime per pharmacy dosing for wound infection. A chart review was completed to assess appropriateness.   Patient does have a reported allergy to PCN, but has received cephalosporins in the past.   The following one time order(s) were placed:  Vancomcyin 1750mg  IV  Cefepime 2g IV   Further antibiotic and/or antibiotic pharmacy consults should be ordered by the admitting provider if indicated.   Thank you for allowing pharmacy to be a part of this patient's care.   Gardner Candle, PharmD, BCPS Clinical Pharmacist 08/13/2023 12:23 PM

## 2023-08-13 NOTE — Assessment & Plan Note (Signed)
 Blood sugar 110s to 130s over the past year SSI A1c Monitor

## 2023-08-13 NOTE — ED Notes (Signed)
 See triage note  Presents from [facility] with possible infection to left foot  States she has been on medication  But not getting any better

## 2023-08-13 NOTE — Progress Notes (Signed)
       CROSS COVER NOTE  NAME: Joanna Phillips MRN: [id number] DOB : May 08, 1951 ATTENDING PHYSICIAN: Floydene Flock, MD    Date of Service   08/13/2023   HPI/Events of Note   Patient requesting her home trileptal regimen due to severe headaches she has suffered since shingle outbreak 8 years ago.  Trileptal verified and ordered       Donnie Mesa NP [facility] Cross Cover 7pm-7am - check amion for availability Pager [phone]

## 2023-08-13 NOTE — Consult Note (Signed)
 ORTHOPAEDIC CONSULTATION  REQUESTING PHYSICIAN: Floydene Flock, MD  Chief Complaint: Left foot abscess  HPI: Joanna Phillips is a 73 y.o. female who complains of worsening abscess to her left foot.  Sent from my office to the hospital due to worsening abscess with cellulitis.  Has undergone multiple rounds of antibiotics without clearance.  It has actually progressed since last being seen by myself.  Past Medical History:  Diagnosis Date   Arthritis    Breast cancer (HCC) 2017   DCIS   CLL (chronic lymphocytic leukemia) (HCC) 2011   DCIS (ductal carcinoma in situ) 03/11/2016   GERD (gastroesophageal reflux disease)    Hyponatremia    Personal history of radiation therapy 2017   Past Surgical History:  Procedure Laterality Date   BREAST BIOPSY Right 02/12/2016   DCIS   BREAST BIOPSY Right 12/27/2019   path pending, calcs, x marker   BREAST LUMPECTOMY Right 03/11/2016   DCIS neg margins   CHOLECYSTECTOMY  1996   COLONOSCOPY  2013   COLONOSCOPY WITH PROPOFOL N/A 04/19/2021   Procedure: COLONOSCOPY WITH PROPOFOL;  Surgeon: Jaynie Collins, DO;  Location: [facility] ENDOSCOPY;  Service: Gastroenterology;  Laterality: N/A;   squamous cell skin on scalp surgery Right    Social History   Socioeconomic History   Marital status: Married    Spouse name: Not on file   Number of children: Not on file   Years of education: Not on file   Highest education level: Not on file  Occupational History   Occupation: retired   Tobacco Use   Smoking status: Never   Smokeless tobacco: Never  Vaping Use   Vaping status: Never Used  Substance and Sexual Activity   Alcohol use: No   Drug use: No   Sexual activity: Not on file  Other Topics Concern   Not on file  Social History Narrative   Not on file   Social Drivers of Health   Financial Resource Strain: Low Risk  (07/23/2023)   Received from [facility]   Overall Financial Resource Strain (CARDIA)    Difficulty  of Paying Living Expenses: Not hard at all  Food Insecurity: No Food Insecurity (07/23/2023)   Received from [facility]   Hunger Vital Sign    Worried About Running Out of Food in the Last Year: Never true    Ran Out of Food in the Last Year: Never true  Transportation Needs: No Transportation Needs (07/23/2023)   Received from [facility] - Transportation    In the past 12 months, has lack of transportation kept you from medical appointments or from getting medications?: No    Lack of Transportation (Non-Medical): No  Physical Activity: Insufficiently Active (01/11/2019)   Exercise Vital Sign    Days of Exercise per Week: 3 days    Minutes of Exercise per Session: 30 min  Stress: No Stress Concern Present (01/11/2019)   [employer] of Occupational Health - Occupational Stress Questionnaire    Feeling of Stress : Not at all  Social Connections: Moderately Integrated (01/11/2019)   Social Connection and Isolation Panel [NHANES]    Frequency of Communication with Friends and Family: More than three times a week    Frequency of Social Gatherings with Friends and Family: More than three times a week    Attends Religious Services: More than 4 times per year    Active Member of Clubs or Organizations: No  Attends Banker Meetings: Never    Marital Status: Married   Family History  Problem Relation Age of Onset   Cancer Father        lung   Diabetes Mother    Breast cancer Maternal Aunt 60   Breast cancer Maternal Aunt 72   Allergies  Allergen Reactions   Amoxicillin Hives   Hydrocodone Itching   Sulfamethoxazole-Trimethoprim Nausea Only   Allopurinol Rash   Prior to Admission medications   Medication Sig Start Date End Date Taking? Authorizing Provider  acetaminophen (TYLENOL) 650 MG CR tablet Take by mouth.    [provider]  aspirin EC 81 MG tablet Take 81 mg by mouth daily.    [provider]   calcium-vitamin D (OSCAL WITH D) 500-200 MG-UNIT TABS tablet Take 1 tablet by mouth daily.    [provider]  Carboxymethylcellulose Sodium (REFRESH CELLUVISC OP) Apply 1 drop to eye every 4 (four) hours as needed.    [provider]  fluticasone (FLONASE) 50 MCG/ACT nasal spray Place into the nose. 09/10/20   [provider]  gabapentin (NEURONTIN) 800 MG tablet Take 1 tablet (800 mg total) by mouth 3 (three) times daily. Home med. 02/27/22   Darlin Priestly, MD  omeprazole (PRILOSEC) 20 MG capsule Take 1 capsule by mouth once daily 01/11/19   Galen Manila, NP  ondansetron (ZOFRAN-ODT) 4 MG disintegrating tablet Take 4 mg by mouth every 8 (eight) hours as needed. Patient not taking: Reported on 05/07/2023 02/27/22   [provider]  OXcarbazepine (TRILEPTAL) 150 MG tablet Take 300 mg by mouth 2 (two) times daily. 08/21/21   [provider]  vitamin B-12 (CYANOCOBALAMIN) 250 MCG tablet Take 250 mcg by mouth daily.    [provider]   MR FOOT LEFT WO CONTRAST Result Date: 08/13/2023 CLINICAL DATA:  Left foot infection since 06/2023. Worsening. Has been on antibiotics but they have not helped. EXAM: MRI OF THE LEFT FOOT WITHOUT CONTRAST TECHNIQUE: Multiplanar, multisequence MR imaging of the left forefoot was performed. No intravenous contrast was administered. COMPARISON:  left foot radiographs 08/13/2023 FINDINGS: Bones/Joint/Cartilage On coronal and sagittal STIR images with the best homogeneity of fat suppression, no marrow edema is seen suspicious for acute osteomyelitis. Artifactual areas of inhomogeneous fat suppression and increased marrow signal throughout all of the toes on the T2 and proton density fat saturation sequences. Mild great toe metatarsophalangeal cartilage thinning and peripheral osteophytosis. Ligaments The Lisfranc ligament complex is intact. The metatarsophalangeal and interphalangeal collateral ligaments are intact. Muscles  and Tendons Normal signal and size of the regional musculature. No tendon tear is seen. Soft tissues There is mild subcutaneous fat edema and swelling throughout the dorsolateral midfoot, greatest at the level of the metatarsal shafts. No walled-off fluid collection to indicate an abscess. IMPRESSION: 1. Mild subcutaneous fat edema and swelling throughout the dorsolateral midfoot, greatest at the level of the metatarsal shafts. No walled-off fluid collection to indicate an abscess. 2. No evidence of acute osteomyelitis. Electronically Signed   By: Neita Garnet M.D.   On: 08/13/2023 16:20   DG Foot Complete Left Result Date: 08/13/2023 CLINICAL DATA:  Infection and dorsal surface of left foot since 06/2023. Antibiotics are not helped. Symptoms have worsened. Dressing in place. EXAM: LEFT FOOT - COMPLETE 3+ VIEW COMPARISON:  None Available. FINDINGS: There is diffuse decreased bone mineralization. Small plantar and posterior calcaneal heel spurs. No soft tissue ulcer or subcutaneous air is seen. No cortical erosion. No  acute fracture or dislocation. IMPRESSION: 1. No radiographic evidence of osteomyelitis. 2. Small plantar and posterior calcaneal heel spurs. Electronically Signed   By: Neita Garnet M.D.   On: 08/13/2023 12:33    Positive ROS: All other systems have been reviewed and were otherwise negative with the exception of those mentioned in the HPI and as above.  12 point ROS was performed.  Physical Exam: General: Alert and oriented.  No apparent distress.  Vascular:  Left foot:Dorsalis Pedis:  present Posterior Tibial:  present  Right foot: Dorsalis Pedis:  present Posterior Tibial:  present  Neuro:intact gross sensation  Derm: On the dorsal aspect of the left foot is noted area of abscess with surrounding cellulitis to the lesser toes.  This is most noted at the base of the third MTPJ.  Purulent drainage is noted.  Ortho/MS: Diffuse edema to the left foot.  I did personally evaluate  the MRI and it is negative for osteomyelitis.  Formation and emphysema is noted throughout the forefoot.  Assessment: Abscess left foot  Plan: Patient has failed oral antibiotics and has had residual abscess with cellulitis to the left foot worsening of recent.  MRI is negative for osteomyelitis.  Will plan on I&D of the abscess region tomorrow.  The risk benefits alternative complicates associated with surgery were discussed with the patient in full.  Orders will be placed at this time.    Irean Hong, DPM Cell [phone]   08/13/2023 5:16 PM

## 2023-08-13 NOTE — Progress Notes (Signed)
 Pharmacy Antibiotic Note  Joanna Phillips is a 73 y.o. female admitted on 08/13/2023 with left foot abscess. She has a PMH of lymphedema, sciatica, CLL, and prediabetes. Patient failed outpatient course of oral antibiotics with podiatry.. Pharmacy has been consulted for vancomycin and cefepime dosing.Patient ordered vancomycin 1750mg  bolus in ED.   Plan: Start Cefepime 2g IV every 8 hours for CrCl >67ml/min  Start Vancomycin 1500 mg IV Q 24 hrs. Goal AUC 400-550. Expected AUC: 497 SCr used: 0.83 Vd:0.72  Pharmacy will continue to monitor and adjust doses as needed.    Height: 5\' 4"  (162.6 cm) Weight: 87.9 kg (193 lb 12.6 oz) IBW/kg (Calculated) : 54.7  Temp (24hrs), Avg:98.2 F (36.8 C), Min:98 F (36.7 C), Max:98.3 F (36.8 C)  Recent Labs  Lab 08/13/23 1117 08/13/23 1200  WBC  --  12.1*  CREATININE 0.83  --   LATICACIDVEN 0.9  --     Estimated Creatinine Clearance: 64.8 mL/min (by C-G formula based on SCr of 0.83 mg/dL).    Allergies  Allergen Reactions   Amoxicillin Hives   Hydrocodone Itching   Sulfamethoxazole-Trimethoprim Nausea Only   Allopurinol Rash    Antimicrobials this admission: 3/27 Vancomycin  3/27  Flagyl  >>  3/27 Cefepime >>    Microbiology results: 3/27 BCx: pending   Thank you for allowing pharmacy to be a part of this patient's care.  Gardner Candle, PharmD, BCPS Clinical Pharmacist 08/13/2023 4:22 PM

## 2023-08-13 NOTE — Assessment & Plan Note (Signed)
 Followed by Dr. Smith Robert outpatient  Stable  Monitor

## 2023-08-13 NOTE — Plan of Care (Signed)
  Problem: Activity: Goal: Risk for activity intolerance will decrease Outcome: Progressing   Problem: Nutrition: Goal: Adequate nutrition will be maintained Outcome: Progressing   Problem: Elimination: Goal: Will not experience complications related to bowel motility Outcome: Progressing Goal: Will not experience complications related to urinary retention Outcome: Progressing   Problem: Pain Managment: Goal: General experience of comfort will improve and/or be controlled Outcome: Progressing   Problem: Safety: Goal: Ability to remain free from injury will improve Outcome: Progressing   Problem: Coping: Goal: Ability to adjust to condition or change in health will improve Outcome: Progressing   Problem: Health Behavior/Discharge Planning: Goal: Ability to identify and utilize available resources and services will improve Outcome: Progressing

## 2023-08-13 NOTE — ED Notes (Signed)
 Pt has been back to MRI fro the 2nd time  Back  Family at bedside Resting at this time

## 2023-08-13 NOTE — ED Triage Notes (Signed)
 Pt was sent here from [facility] due to infection in left foot since February which has been gotten worse recently.  No fever or chills.  Pt has dressing in place.  Pt states that she had xray this am and Dr. Ether Griffins wanted her to come here to have MRI of this foot.  She has been on antibiotics but they have not helped, she tells me Dr. Ether Griffins states that she might need surgery.

## 2023-08-14 ENCOUNTER — Encounter
Admission: EM | Disposition: A | Discharge status: Home Health | Payer: Self-pay | Source: Ambulatory Visit | Attending: Hospitalist

## 2023-08-14 ENCOUNTER — Inpatient Hospital Stay: Admitting: Anesthesiology

## 2023-08-14 ENCOUNTER — Other Ambulatory Visit: Payer: Self-pay

## 2023-08-14 ENCOUNTER — Ambulatory Visit: Admit: 2023-08-14 | Admitting: Podiatry

## 2023-08-14 DIAGNOSIS — S91302A Unspecified open wound, left foot, initial encounter: Secondary | ICD-10-CM | POA: Diagnosis not present

## 2023-08-14 HISTORY — PX: IRRIGATION AND DEBRIDEMENT FOOT: SHX6602

## 2023-08-14 LAB — COMPREHENSIVE METABOLIC PANEL WITH GFR
ALT: 11 U/L (ref 0–44)
AST: 15 U/L (ref 15–41)
Albumin: 3.2 g/dL — ABNORMAL LOW (ref 3.5–5.0)
Alkaline Phosphatase: 69 U/L (ref 38–126)
Anion gap: 9 (ref 5–15)
BUN: 21 mg/dL (ref 8–23)
CO2: 23 mmol/L (ref 22–32)
Calcium: 8.6 mg/dL — ABNORMAL LOW (ref 8.9–10.3)
Chloride: 105 mmol/L (ref 98–111)
Creatinine, Ser: 0.69 mg/dL (ref 0.44–1.00)
GFR, Estimated: >60 mL/min (ref >60)
Glucose, Bld: 98 mg/dL (ref 70–99)
Potassium: 4.3 mmol/L (ref 3.5–5.1)
Sodium: 137 mmol/L (ref 135–145)
Total Bilirubin: 0.6 mg/dL (ref 0.0–1.2)
Total Protein: 5.7 g/dL — ABNORMAL LOW (ref 6.5–8.1)

## 2023-08-14 LAB — GLUCOSE, CAPILLARY
Glucose-Capillary: 97 mg/dL (ref 70–99)
Glucose-Capillary: 86 mg/dL (ref 70–99)
Glucose-Capillary: 93 mg/dL (ref 70–99)
Glucose-Capillary: 93 mg/dL (ref 70–99)
Glucose-Capillary: 85 mg/dL (ref 70–99)
Glucose-Capillary: 119 mg/dL — ABNORMAL HIGH (ref 70–99)

## 2023-08-14 LAB — CBC
HCT: 34 % — ABNORMAL LOW (ref 36.0–46.0)
Hemoglobin: 11.2 g/dL — ABNORMAL LOW (ref 12.0–15.0)
MCH: 27.1 pg (ref 26.0–34.0)
MCHC: 32.9 g/dL (ref 30.0–36.0)
MCV: 82.3 fL (ref 80.0–100.0)
Platelets: 191 10*3/uL (ref 150–400)
RBC: 4.13 MIL/uL (ref 3.87–5.11)
RDW: 14.3 % (ref 11.5–15.5)
WBC: 5.7 10*3/uL (ref 4.0–10.5)
nRBC: 0 % (ref 0.0–0.2)

## 2023-08-14 LAB — HEMOGLOBIN A1C
Hgb A1c MFr Bld: 5.5 % (ref 4.8–5.6)
Mean Plasma Glucose: 111.15 mg/dL

## 2023-08-14 SURGERY — IRRIGATION AND DEBRIDEMENT FOOT
Anesthesia: General | Laterality: Left

## 2023-08-14 MED ORDER — BUPIVACAINE HCL (PF) 0.5 % IJ SOLN
INTRAMUSCULAR | Status: AC
Start: 2023-08-14 — End: ?
  Filled 2023-08-14: qty 30

## 2023-08-14 MED ORDER — MIDAZOLAM HCL 2 MG/2ML IJ SOLN
INTRAMUSCULAR | Status: DC | PRN
  Administered 2023-08-14: 2 mg via INTRAVENOUS

## 2023-08-14 MED ORDER — FENTANYL CITRATE (PF) 100 MCG/2ML IJ SOLN
INTRAMUSCULAR | Status: AC
  Filled 2023-08-14: qty 2

## 2023-08-14 MED ORDER — PROPOFOL 500 MG/50ML IV EMUL
INTRAVENOUS | Status: DC | PRN
Start: 2023-08-14 — End: 2023-08-14
  Administered 2023-08-14: 25 ug/kg/min via INTRAVENOUS

## 2023-08-14 MED ORDER — POVIDONE-IODINE 10 % EX SWAB: 2.0000 | Freq: Once | CUTANEOUS | Status: DC

## 2023-08-14 MED ORDER — LIDOCAINE-EPINEPHRINE 1 %-1:100000 IJ SOLN
INTRAMUSCULAR | Status: AC
  Filled 2023-08-14: qty 1

## 2023-08-14 MED ORDER — ADULT MULTIVITAMIN W/MINERALS CH
1.0000 | ORAL_TABLET | Freq: Every day | ORAL | Status: DC
  Administered 2023-08-15 – 2023-08-17 (×3): 1 via ORAL
  Filled 2023-08-14 (×3): qty 1

## 2023-08-14 MED ORDER — BUPIVACAINE HCL 0.25 % IJ SOLN
INTRAMUSCULAR | Status: DC | PRN
  Administered 2023-08-14: 5 mL

## 2023-08-14 MED ORDER — LIDOCAINE-EPINEPHRINE 1 %-1:100000 IJ SOLN
INTRAMUSCULAR | Status: DC | PRN
  Administered 2023-08-14: 5 mL

## 2023-08-14 MED ORDER — CHLORHEXIDINE GLUCONATE 4 % EX SOLN: 60.0000 mL | Freq: Once | CUTANEOUS | Status: DC

## 2023-08-14 MED ORDER — VITAMIN C 500 MG PO TABS
500.0000 mg | ORAL_TABLET | Freq: Two times a day (BID) | ORAL | Status: DC
  Administered 2023-08-15 – 2023-08-17 (×5): 500 mg via ORAL
  Filled 2023-08-14 (×5): qty 1

## 2023-08-14 MED ORDER — CHLORHEXIDINE GLUCONATE CLOTH 2 % EX PADS
6.0000 | MEDICATED_PAD | Freq: Every day | CUTANEOUS | Status: DC
  Administered 2023-08-14 – 2023-08-15 (×2): 6 via TOPICAL

## 2023-08-14 MED ORDER — OXYCODONE HCL 5 MG PO TABS: 5.0000 mg | ORAL_TABLET | Freq: Once | ORAL | Status: DC | PRN

## 2023-08-14 MED ORDER — PROPOFOL 10 MG/ML IV BOLUS
INTRAVENOUS | Status: DC | PRN
  Administered 2023-08-14: 50 mg via INTRAVENOUS

## 2023-08-14 MED ORDER — OXYCODONE HCL 5 MG/5ML PO SOLN: 5.0000 mg | Freq: Once | ORAL | Status: DC | PRN

## 2023-08-14 MED ORDER — DROPERIDOL 2.5 MG/ML IJ SOLN: 0.6250 mg | Freq: Once | INTRAMUSCULAR | Status: DC | PRN

## 2023-08-14 MED ORDER — FENTANYL CITRATE (PF) 100 MCG/2ML IJ SOLN: 25.0000 ug | INTRAMUSCULAR | Status: DC | PRN

## 2023-08-14 MED ORDER — BUPIVACAINE HCL (PF) 0.25 % IJ SOLN
INTRAMUSCULAR | Status: AC
Start: 2023-08-14 — End: ?
  Filled 2023-08-14: qty 30

## 2023-08-14 MED ORDER — PROPOFOL 10 MG/ML IV BOLUS
INTRAVENOUS | Status: AC
  Filled 2023-08-14: qty 20

## 2023-08-14 MED ORDER — CHLORHEXIDINE GLUCONATE 4 % EX SOLN
60.0000 mL | Freq: Once | CUTANEOUS | Status: AC
  Administered 2023-08-14: 4 via TOPICAL

## 2023-08-14 MED ORDER — ONDANSETRON HCL 4 MG/2ML IJ SOLN
INTRAMUSCULAR | Status: AC
  Filled 2023-08-14: qty 2

## 2023-08-14 MED ORDER — ZINC SULFATE 220 (50 ZN) MG PO CAPS
220.0000 mg | ORAL_CAPSULE | Freq: Every day | ORAL | Status: DC
  Administered 2023-08-15 – 2023-08-17 (×3): 220 mg via ORAL
  Filled 2023-08-14 (×3): qty 1

## 2023-08-14 MED ORDER — MIDAZOLAM HCL 2 MG/2ML IJ SOLN
INTRAMUSCULAR | Status: AC
  Filled 2023-08-14: qty 2

## 2023-08-14 MED ORDER — PROPOFOL 1000 MG/100ML IV EMUL
INTRAVENOUS | Status: AC
  Filled 2023-08-14: qty 100

## 2023-08-14 MED ORDER — ACETAMINOPHEN 10 MG/ML IV SOLN: 1000.0000 mg | Freq: Once | INTRAVENOUS | Status: DC | PRN

## 2023-08-14 SURGICAL SUPPLY — 56 items
BLADE OSC/SAGITTAL MD 5.5X18 (BLADE) IMPLANT
BLADE SURG 15 STRL LF DISP TIS (BLADE) ×1 IMPLANT
BLADE SW THK.38XMED LNG THN (BLADE) IMPLANT
BNDG COHESIVE 4X5 TAN STRL LF (GAUZE/BANDAGES/DRESSINGS) ×1 IMPLANT
BNDG COHESIVE 6X5 TAN ST LF (GAUZE/BANDAGES/DRESSINGS) ×1 IMPLANT
BNDG ELASTIC 4INX 5YD STR LF (GAUZE/BANDAGES/DRESSINGS) ×1 IMPLANT
BNDG ESMARCH 4X12 STRL LF (GAUZE/BANDAGES/DRESSINGS) ×1 IMPLANT
BNDG GAUZE DERMACEA FLUFF 4 (GAUZE/BANDAGES/DRESSINGS) ×1 IMPLANT
BNDG STRETCH GAUZE 3IN X12FT (GAUZE/BANDAGES/DRESSINGS) ×1 IMPLANT
CANISTER WOUND CARE 500ML ATS (WOUND CARE) ×1 IMPLANT
CUFF TOURN SGL QUICK 12 (TOURNIQUET CUFF) IMPLANT
CUFF TOURN SGL QUICK 18X4 (TOURNIQUET CUFF) IMPLANT
DRAPE FLUOR MINI C-ARM 54X84 (DRAPES) IMPLANT
DRAPE XRAY CASSETTE 23X24 (DRAPES) IMPLANT
DRSG MEPILEX FLEX 3X3 (GAUZE/BANDAGES/DRESSINGS) IMPLANT
DURAPREP 26ML APPLICATOR (WOUND CARE) ×1 IMPLANT
ELECT REM PT RETURN 9FT ADLT (ELECTROSURGICAL) ×1 IMPLANT
ELECTRODE REM PT RTRN 9FT ADLT (ELECTROSURGICAL) ×1 IMPLANT
GAUZE PACKING 0.25INX5YD STRL (GAUZE/BANDAGES/DRESSINGS) ×1 IMPLANT
GAUZE SPONGE 4X4 12PLY STRL (GAUZE/BANDAGES/DRESSINGS) ×1 IMPLANT
GAUZE STRETCH 2X75IN STRL (MISCELLANEOUS) ×1 IMPLANT
GAUZE XEROFORM 1X8 LF (GAUZE/BANDAGES/DRESSINGS) ×1 IMPLANT
GLOVE BIO SURGEON STRL SZ7.5 (GLOVE) ×1 IMPLANT
GLOVE INDICATOR 8.0 STRL GRN (GLOVE) ×1 IMPLANT
GOWN STRL REUS W/ TWL LRG LVL3 (GOWN DISPOSABLE) ×1 IMPLANT
GOWN STRL REUS W/TWL MED LVL3 (GOWN DISPOSABLE) ×1 IMPLANT
GOWN STRL REUS W/TWL XL LVL4 (GOWN DISPOSABLE) ×1 IMPLANT
HANDPIECE VERSAJET DEBRIDEMENT (MISCELLANEOUS) IMPLANT
IV NS 1000ML BAXH (IV SOLUTION) ×1 IMPLANT
KIT DRSG VAC SLVR GRANUFM (MISCELLANEOUS) ×1 IMPLANT
KIT TURNOVER KIT A (KITS) ×1 IMPLANT
LABEL OR SOLS (LABEL) ×1 IMPLANT
MANIFOLD NEPTUNE II (INSTRUMENTS) ×1 IMPLANT
NDL FILTER BLUNT 18X1 1/2 (NEEDLE) ×1 IMPLANT
NDL HYPO 25X1 1.5 SAFETY (NEEDLE) ×1 IMPLANT
NEEDLE FILTER BLUNT 18X1 1/2 (NEEDLE) ×1 IMPLANT
NEEDLE HYPO 25X1 1.5 SAFETY (NEEDLE) ×1 IMPLANT
NS IRRIG 500ML POUR BTL (IV SOLUTION) ×1 IMPLANT
PACK EXTREMITY ARMC (MISCELLANEOUS) ×1 IMPLANT
PACKING GAUZE IODOFORM 1INX5YD (GAUZE/BANDAGES/DRESSINGS) ×1 IMPLANT
PAD ABD DERMACEA PRESS 5X9 (GAUZE/BANDAGES/DRESSINGS) ×1 IMPLANT
PULSAVAC PLUS IRRIG FAN TIP (DISPOSABLE) ×1 IMPLANT
RASP SM TEAR CROSS CUT (RASP) IMPLANT
SHIELD FULL FACE ANTIFOG 7M (MISCELLANEOUS) ×1 IMPLANT
SOL .9 NS 3000ML IRR UROMATIC (IV SOLUTION) ×1 IMPLANT
STOCKINETTE IMPERVIOUS 9X36 MD (GAUZE/BANDAGES/DRESSINGS) ×1 IMPLANT
SUT ETHILON 2 0 FS 18 (SUTURE) ×2 IMPLANT
SUT ETHILON 4-0 FS2 18XMFL BLK (SUTURE) ×1 IMPLANT
SUT VIC AB 3-0 SH 27X BRD (SUTURE) ×1 IMPLANT
SUTURE ETHLN 4-0 FS2 18XMF BLK (SUTURE) ×1 IMPLANT
SWAB CULTURE AMIES ANAERIB BLU (MISCELLANEOUS) IMPLANT
SYR 10ML LL (SYRINGE) ×1 IMPLANT
SYR 3ML LL SCALE MARK (SYRINGE) ×1 IMPLANT
TIP FAN IRRIG PULSAVAC PLUS (DISPOSABLE) ×1 IMPLANT
TRAP FLUID SMOKE EVACUATOR (MISCELLANEOUS) ×1 IMPLANT
WATER STERILE IRR 500ML POUR (IV SOLUTION) ×1 IMPLANT

## 2023-08-14 NOTE — Progress Notes (Signed)
  PROGRESS NOTE    Joanna Phillips  ZOX:[id number] DOB: Jul 15, 1950 DOA: 08/13/2023 PCP: Lauro Regulus, MD  130A/130A-AA  LOS: 1 day   Brief hospital course:   Assessment & Plan: Joanna Phillips is a 73 y.o. female with medical history significant of CLL, GERD, hx/o breast cancer, L foot wound presenting w/ L foot infection.  Patient reports initial diagnosis of left foot infection from February of this year.  Unclear if this was traumatic in etiology.  Has been followed by Dr. Ether Griffins for treatment.  Patient reports having course of antibiotics for treatment that has not helped.  No fevers or chills.  No nausea or vomiting.  Denies any known history of diabetes though has diagnosis of prediabetes documentation.  Non-smoker.  No alcohol abuse.  States that she has had worsening redness pain and swelling.  Was evaluated by podiatry earlier today with recommendation for come to the ER for further evaluation including MRI of the foot.    * nonhealing left foot wound with abscess Worsening left foot abscess with failed outpatient course of oral antibiotics with podiatry (Dr. Ether Griffins)  MRI of the left foot showed no osteo --surgical I/D today --cont IV cefepime, Flagyl vancomycin for infectious coverage  Lymphedema Followed by Dr. Gilda Crease outpatient  At baseline  Monitor   Sciatica Cont neurontin    Prediabetes, not currently active --A1c 5.5 --d/c BG checks and SSI  GERD (gastroesophageal reflux disease) --cont PPI  CLL (chronic lymphocytic leukemia) (HCC) Followed by Dr. Smith Robert outpatient  Stable  Monitor    DVT prophylaxis: Lovenox SQ Code Status: Full code  Family Communication: husband updated at bedside today Level of care: Med-Surg Dispo:   The patient is from: home Anticipated d/c is to: home Anticipated d/c date is: 2-3 days   Subjective and Interval History:  Underwent surgical I/D today.   Objective: Vitals:   08/14/23 1540 08/14/23 1545 08/14/23 1600  08/14/23 1642  BP: (!) 107/49 (!) 114/53 (!) 110/55 125/65  Pulse: (!) 43 68 67 63  Resp: 15 16 17 18   Temp: (!) 97.4 F (36.3 C)   97.6 F (36.4 C)  TempSrc:      SpO2: 90% 98% 99% 100%  Weight:      Height:        Intake/Output Summary (Last 24 hours) at 08/14/2023 1942 Last data filed at 08/14/2023 1827 Gross per 24 hour  Intake 1674.49 ml  Output --  Net 1674.49 ml   Filed Weights   08/13/23 1110 08/13/23 1343  Weight: 88 kg 87.9 kg    Examination:   Constitutional: NAD, AAOx3 HEENT: conjunctivae and lids normal, EOMI CV: No cyanosis.   RESP: normal respiratory effort, on RA Neuro: II - XII grossly intact.   Psych: Normal mood and affect.  Appropriate judgement and reason   Data Reviewed: I have personally reviewed labs and imaging studies  Time spent: 50 minutes  Darlin Priestly, MD [facility] If 7PM-7AM, please contact night-coverage 08/14/2023, 7:42 PM

## 2023-08-14 NOTE — Transfer of Care (Signed)
 Immediate Anesthesia Transfer of Care Note  Patient: Joanna Phillips  Procedure(s) Performed: IRRIGATION AND DEBRIDEMENT FOOT (Left)  Patient Location: PACU  Anesthesia Type:General  Level of Consciousness: awake, alert , oriented, and patient cooperative  Airway & Oxygen Therapy: Patient Spontanous Breathing  Post-op Assessment: Report given to RN and Post -op Vital signs reviewed and stable  Post vital signs: Reviewed and stable  Last Vitals:  Vitals Value Taken Time  BP 107/49 08/14/23 1540  Temp 97   Pulse 74 08/14/23 1544  Resp 17 08/14/23 1544  SpO2 99 % 08/14/23 1544  Vitals shown include unfiled device data.  Last Pain:  Vitals:   08/14/23 1414  TempSrc: Temporal  PainSc: 0-No pain         Complications: No notable events documented.

## 2023-08-14 NOTE — Consult Note (Signed)
 WOC team consulted for lower extremity wound. After review of notes patient has a wound to dorsal aspect L foot being managed by podiatry.  Podiatry plans to I&D this area 08/14/2023.  Since this wound is being managed by podiatry WOC team will sign off.   Reconsult if further needs arise.   Thank you,    Priscella Mann MSN, RN-BC, [employer] [phone]

## 2023-08-14 NOTE — Anesthesia Preprocedure Evaluation (Addendum)
 Anesthesia Evaluation  Patient identified by MRN, date of birth, ID band Patient awake    Reviewed: Allergy & Precautions, H&P , NPO status , Patient's Chart, lab work & pertinent test results  Airway Mallampati: II  TM Distance: >3 FB Neck ROM: full    Dental no notable dental hx.    Pulmonary neg pulmonary ROS   Pulmonary exam normal        Cardiovascular negative cardio ROS  + Systolic murmurs    Neuro/Psych negative neurological ROS  negative psych ROS   GI/Hepatic Neg liver ROS,GERD  ,,  Endo/Other  Hypothyroidism    Renal/GU negative Renal ROS  negative genitourinary   Musculoskeletal  (+) Arthritis ,    Abdominal  (+) + obese  Peds  Hematology  (+) Blood dyscrasia, anemia   Anesthesia Other Findings Past Medical History: No date: Arthritis 2017: Breast cancer (HCC)     Comment:  DCIS 2011: CLL (chronic lymphocytic leukemia) (HCC) 03/11/2016: DCIS (ductal carcinoma in situ) No date: GERD (gastroesophageal reflux disease) No date: Hyponatremia 2017: Personal history of radiation therapy  Past Surgical History: 02/12/2016: BREAST BIOPSY; Right     Comment:  DCIS 12/27/2019: BREAST BIOPSY; Right     Comment:  path pending, calcs, x marker 03/11/2016: BREAST LUMPECTOMY; Right     Comment:  DCIS neg margins 1996: CHOLECYSTECTOMY 2013: COLONOSCOPY 04/19/2021: COLONOSCOPY WITH PROPOFOL; N/A     Comment:  Procedure: COLONOSCOPY WITH PROPOFOL;  Surgeon: Jaynie Collins, DO;  Location: [facility] ENDOSCOPY;  Service:               Gastroenterology;  Laterality: N/A; No date: squamous cell skin on scalp surgery; Right  BMI    Body Mass Index: 33.26 kg/m      Reproductive/Obstetrics negative OB ROS                             Anesthesia Physical Anesthesia Plan  ASA: 2  Anesthesia Plan: General   Post-op Pain Management: Regional block*   Induction:  Intravenous  PONV Risk Score and Plan: Propofol infusion and TIVA  Airway Management Planned: Natural Airway  Additional Equipment:   Intra-op Plan:   Post-operative Plan:   Informed Consent: I have reviewed the patients History and Physical, chart, labs and discussed the procedure including the risks, benefits and alternatives for the proposed anesthesia with the patient or authorized representative who has indicated his/her understanding and acceptance.     Dental Advisory Given  Plan Discussed with: CRNA and Surgeon  Anesthesia Plan Comments:         Anesthesia Quick Evaluation

## 2023-08-14 NOTE — Op Note (Signed)
 Operative note   Surgeon:Caron Ode Armed forces logistics/support/administrative officer: None    Preop diagnosis: Nonhealing abscess left foot    Postop diagnosis: Same    Procedure: Incision and drainage of abscess with tissue biopsy and cultures left foot    EBL: Minimal    Anesthesia:local and IV sedation    Hemostasis: Lidocaine with epinephrine infiltrated along the incision site    Specimen: Deep wound culture with swab as well as tissue for aerobic and anaerobic culture along with acid-fast and fungal culture.  Also deep tissue for pathology    Complications: None    Operative indications:May Shela Commons Deroo is an 73 y.o. that presents today for surgical intervention.  The risks/benefits/alternatives/complications have been discussed and consent has been given.    Procedure:  Patient was brought into the OR and placed on the operating table in thesupine position. After anesthesia was obtained theleft lower extremity was prepped and draped in usual sterile fashion.  Attention was directed to the dorsal aspect of the left foot where the abscessed ulceration was noted.  Blunt dissection was used initially this was just proximal to the third MTPJ.  This was taken down into the deeper subcutaneous tissue.  Compression revealed a little bit of purulent drainage.  A deep wound culture was performed at this time.  Next samples of tissue with a rongeur were removed and samples were sent for acid-fast bacillus as well as fungal culture.  Also deep tissue samples were sent for pathological examination.  The wound was probed medial and lateral around the third MTPJ without further extension to the joint.  No further purulence was noted surrounding the joint region.  After final flush the wound was packed with iodoform gauze packing strip.  A bulky sterile dressing was applied.    Patient tolerated the procedure and anesthesia well.  Was transported from the OR to the PACU with all vital signs stable and vascular status intact. To be  discharged per routine protocol.  Will follow up in approximately 1 week in the outpatient clinic.

## 2023-08-14 NOTE — Progress Notes (Signed)
 Initial Nutrition Assessment  DOCUMENTATION CODES:   Obesity unspecified  INTERVENTION:   -Once diet is advanced:   -Magic cup BID with meals, each supplement provides 290 kcal and 9 grams of protein  -MVI with minerals daily -500 mg vitamin C BID -220 mg zinc sulfate daily x 14 days  NUTRITION DIAGNOSIS:   Increased nutrient needs related to wound healing as evidenced by estimated needs.  GOAL:   Patient will meet greater than or equal to 90% of their needs  MONITOR:   PO intake, Supplement acceptance  REASON FOR ASSESSMENT:   Consult Wound healing  ASSESSMENT:   Pt with medical history significant of CLL, GERD, hx/o breast cancer, L foot wound presenting w/ L foot infection.  Pt admitted with worsening lt foot abscess.   Reviewed I/O's: +240 ml x 24 hours  Per podiatry notes, plan for I&D this afternoon. Pt currently NPO for procedure.   Spoke with pt and husband at bedside, both pleasant and in good spirits today. Pt reports abscess developing on her foot a few months ago and has been followed by podiatry group as an outpatient, where she was prescribed antibiotics, but did not see any improvement. She is hopeful that surgery today will help with management. Pt's largest concern is pain in her foot, which prevents her for standing long periods of time. Her husband has been cooking for her at home due to this.   Pt reports good appetite. She consumes 3 meals per day (Breakfast: eggs and toast or cereal; Lunch: leftovers from the night before Dinner: meat, starch, and vegetable). Pt daughter also resides with pt and assists with meal preparation.   Pt denies any weight loss. Reviewed wt hx; wt has been stable over the past 3 months.   Discussed importance of good meal and supplement intake to promote healing. Pt amenable to vitamins and supplements.   Medications reviewed and include lovenox, neurontin, and protonix.   Labs reviewed: CBGS: 86-132 (inpatient orders  for glycemic control are 0-9 units insulin aspart TID with meals).    NUTRITION - FOCUSED PHYSICAL EXAM:  Flowsheet Row Most Recent Value  Orbital Region No depletion  Upper Arm Region Mild depletion  Thoracic and Lumbar Region No depletion  Buccal Region No depletion  Temple Region No depletion  Clavicle Bone Region No depletion  Clavicle and Acromion Bone Region No depletion  Scapular Bone Region No depletion  Dorsal Hand No depletion  Patellar Region No depletion  Anterior Thigh Region No depletion  Posterior Calf Region No depletion  Edema (RD Assessment) Mild  Hair Reviewed  Eyes Reviewed  Mouth Reviewed  Skin Reviewed  Nails Reviewed       Diet Order:   Diet Order             Diet NPO time specified Except for: Sips with Meds  Diet effective now                   EDUCATION NEEDS:   Education needs have been addressed  Skin:  Skin Assessment: Skin Integrity Issues: Skin Integrity Issues:: Other (Comment) Other: residual abcess to lt foot  Last BM:  08/13/23  Height:   Ht Readings from Last 1 Encounters:  08/13/23 5\' 4"  (1.626 m)    Weight:   Wt Readings from Last 1 Encounters:  08/13/23 87.9 kg    Ideal Body Weight:  54.5 kg  BMI:  Body mass index is 33.26 kg/m.  Estimated Nutritional Needs:  Kcal:  1650-1850  Protein:  85-100 grams  Fluid:  > 1.6 L    Levada Schilling, RD, LDN, CDCES Registered Dietitian III Certified Diabetes Care and Education Specialist If unable to reach this RD, please use "RD Inpatient" group chat on secure chat between hours of 8am-4 pm daily

## 2023-08-15 DIAGNOSIS — S91302A Unspecified open wound, left foot, initial encounter: Secondary | ICD-10-CM | POA: Diagnosis not present

## 2023-08-15 LAB — BASIC METABOLIC PANEL WITH GFR
Anion gap: 7 (ref 5–15)
BUN: 20 mg/dL (ref 8–23)
CO2: 24 mmol/L (ref 22–32)
Calcium: 8.5 mg/dL — ABNORMAL LOW (ref 8.9–10.3)
Chloride: 105 mmol/L (ref 98–111)
Creatinine, Ser: 0.72 mg/dL (ref 0.44–1.00)
GFR, Estimated: >60 mL/min (ref >60)
Glucose, Bld: 121 mg/dL — ABNORMAL HIGH (ref 70–99)
Potassium: 4.2 mmol/L (ref 3.5–5.1)
Sodium: 136 mmol/L (ref 135–145)

## 2023-08-15 LAB — CBC
HCT: 32.6 % — ABNORMAL LOW (ref 36.0–46.0)
Hemoglobin: 10.7 g/dL — ABNORMAL LOW (ref 12.0–15.0)
MCH: 27.3 pg (ref 26.0–34.0)
MCHC: 32.8 g/dL (ref 30.0–36.0)
MCV: 83.2 fL (ref 80.0–100.0)
Platelets: 210 10*3/uL (ref 150–400)
RBC: 3.92 MIL/uL (ref 3.87–5.11)
RDW: 14.2 % (ref 11.5–15.5)
WBC: 7 10*3/uL (ref 4.0–10.5)
nRBC: 0 % (ref 0.0–0.2)

## 2023-08-15 LAB — MAGNESIUM: Magnesium: 2 mg/dL (ref 1.7–2.4)

## 2023-08-15 LAB — GLUCOSE, CAPILLARY
Glucose-Capillary: 209 mg/dL — ABNORMAL HIGH (ref 70–99)
Glucose-Capillary: 86 mg/dL (ref 70–99)

## 2023-08-15 NOTE — Progress Notes (Signed)
  PROGRESS NOTE    Joanna Phillips  ZOX:[id number] DOB: 07-14-1950 DOA: 08/13/2023 PCP: Lauro Regulus, MD  130A/130A-AA  LOS: 2 days   Brief hospital course:   Assessment & Plan: Joanna Phillips is a 73 y.o. female with medical history significant of CLL, GERD, hx/o breast cancer, L foot wound presenting w/ L foot infection.  Patient reports initial diagnosis of left foot infection from February of this year.  Unclear if this was traumatic in etiology.  Has been followed by Dr. Ether Griffins for treatment.  Patient reports having course of antibiotics for treatment that has not helped.  No fevers or chills.  No nausea or vomiting.  Denies any known history of diabetes though has diagnosis of prediabetes documentation.  Non-smoker.  No alcohol abuse.  States that she has had worsening redness pain and swelling.  Was evaluated by podiatry earlier today with recommendation for come to the ER for further evaluation including MRI of the foot.    * nonhealing left foot wound with abscess S/p I/D on 3/28 Worsening left foot abscess with failed outpatient course of oral antibiotics with podiatry (Dr. Ether Griffins)  MRI of the left foot showed no osteo --cont IV cefepime, Flagyl vancomycin for infectious coverage --dressing change per podiatry  Lymphedema Followed by Dr. Gilda Crease outpatient  At baseline  Monitor   Sciatica Cont neurontin    Prediabetes, not currently active --A1c 5.5 --no need for BG checks   GERD (gastroesophageal reflux disease) --cont PPI  CLL (chronic lymphocytic leukemia) (HCC) Followed by Dr. Smith Robert outpatient  Stable  Monitor    DVT prophylaxis: Lovenox SQ Code Status: Full code  Family Communication: husband updated at bedside today Level of care: Med-Surg Dispo:   The patient is from: home Anticipated d/c is to: home Anticipated d/c date is: 2-3 days   Subjective and Interval History:  Doing well post-op.  Had dressing change with podiatry  today.   Objective: Vitals:   08/14/23 1642 08/15/23 0525 08/15/23 0907 08/15/23 1634  BP: 125/65 (!) 81/47 95/76 (!) 100/47  Pulse: 63 66 81 70  Resp: 18 20 20 18   Temp: 97.6 F (36.4 C) 98.4 F (36.9 C) 97.8 F (36.6 C) 98.2 F (36.8 C)  TempSrc:      SpO2: 100% 96% 100% 100%  Weight:      Height:       No intake or output data in the 24 hours ending 08/15/23 1914  Filed Weights   08/13/23 1110 08/13/23 1343  Weight: 88 kg 87.9 kg    Examination:   Constitutional: NAD, AAOx3 HEENT: conjunctivae and lids normal, EOMI CV: No cyanosis.   RESP: normal respiratory effort, on RA Neuro: II - XII grossly intact.   Psych: Normal mood and affect.  Appropriate judgement and reason   Data Reviewed: I have personally reviewed labs and imaging studies  Time spent: 35 minutes  Darlin Priestly, MD [facility] If 7PM-7AM, please contact night-coverage 08/15/2023, 7:14 PM

## 2023-08-15 NOTE — Progress Notes (Signed)
 Daily Progress Note   Subjective  - 1 Day Post-Op  Status post left foot I&D.  No complaints today.  Mild discomfort.  Objective Vitals:   08/14/23 1600 08/14/23 1642 08/15/23 0525 08/15/23 0907  BP: (!) 110/55 125/65 (!) 81/47 95/76  Pulse: 67 63 66 81  Resp: 17 18 20 20   Temp:  97.6 F (36.4 C) 98.4 F (36.9 C) 97.8 F (36.6 C)  TempSrc:      SpO2: 99% 100% 96% 100%  Weight:      Height:        Physical Exam: Dressing removed.  Packing removed.  Open ulceration.  No purulent drainage.  Erythema is improved from yesterday.  Still awaiting culture results but luminary shows no organisms at this time.  Laboratory CBC    Component Value Date/Time   WBC 7.0 08/15/2023 0325   HGB 10.7 (L) 08/15/2023 0325   HGB 11.0 (L) 09/26/2016 1507   HCT 32.6 (L) 08/15/2023 0325   HCT 34.1 09/26/2016 1507   PLT 210 08/15/2023 0325   PLT 267 09/26/2016 1507    BMET    Component Value Date/Time   NA 136 08/15/2023 0325   NA 141 02/13/2016 1049   K 4.2 08/15/2023 0325   CL 105 08/15/2023 0325   CO2 24 08/15/2023 0325   GLUCOSE 121 (H) 08/15/2023 0325   BUN 20 08/15/2023 0325   BUN 18 02/13/2016 1049   CREATININE 0.72 08/15/2023 0325   CALCIUM 8.5 (L) 08/15/2023 0325   GFRNONAA >60 08/15/2023 0325   GFRAA >60 02/06/2020 0957    Assessment/Planning: Abscess left foot status post I&D  New dressing applied.  Packing performed today.  Will continue with current antibiotics.  Awaiting for the preliminary results from cultures.  If cultures remain benign can likely discharge home oral antibiotics and continue to follow closely outpatient. Will follow-up tomorrow for dressing change and reevaluation.  Gwyneth Revels A  08/15/2023, 10:58 AM

## 2023-08-15 NOTE — Plan of Care (Signed)
  Problem: Education: Goal: Knowledge of General Education information will improve Description: Including pain rating scale, medication(s)/side effects and non-pharmacologic comfort measures Outcome: Progressing   Problem: Health Behavior/Discharge Planning: Goal: Ability to manage health-related needs will improve Outcome: Progressing   Problem: Clinical Measurements: Goal: Ability to maintain clinical measurements within normal limits will improve Outcome: Progressing Goal: Will remain free from infection Outcome: Progressing Goal: Diagnostic test results will improve Outcome: Progressing Goal: Respiratory complications will improve Outcome: Progressing Goal: Cardiovascular complication will be avoided Outcome: Progressing   Problem: Activity: Goal: Risk for activity intolerance will decrease Outcome: Progressing   Problem: Nutrition: Goal: Adequate nutrition will be maintained Outcome: Progressing   Problem: Coping: Goal: Level of anxiety will decrease Outcome: Progressing   Problem: Elimination: Goal: Will not experience complications related to bowel motility Outcome: Progressing Goal: Will not experience complications related to urinary retention Outcome: Progressing   Problem: Skin Integrity: Goal: Risk for impaired skin integrity will decrease Outcome: Progressing   Problem: Safety: Goal: Ability to remain free from injury will improve Outcome: Progressing   Problem: Clinical Measurements: Goal: Ability to avoid or minimize complications of infection will improve Outcome: Progressing

## 2023-08-16 DIAGNOSIS — S91302A Unspecified open wound, left foot, initial encounter: Secondary | ICD-10-CM | POA: Diagnosis not present

## 2023-08-16 LAB — ACID FAST SMEAR (AFB, MYCOBACTERIA): Acid Fast Smear: NEGATIVE

## 2023-08-16 NOTE — Discharge Instructions (Signed)
 Dressing changes: 3 x's per week.  Flush wound with saline.  Pack with iodoform packing and cover with bulky sterile dressing.

## 2023-08-16 NOTE — TOC Initial Note (Signed)
 Transition of Care [facility]) - Initial/Assessment Note    Patient Details  Name: Joanna Phillips MRN: [id number] Date of Birth: 31-Mar-1951  [facility]) CM/SW Contact:    Liliana Cline, LCSW Phone Number: 08/16/2023, 2:45 PM  Clinical Narrative:                 CSW received consult for [facility] for dressing changes. CSW spoke with patient. Patient is from home with spouse and daughter. PCP is Dr. Dareen Piano. Pharmacy is [facility]. Patient has a rolling walker, cane, built in shower seat, and grab bars at home. Spouse and daughter will be providing transportation. Confirmed home address. Patient is agreeable to [facility] being arranged and would like to use [facility] as she has used them in the past. Referral was accepted by Bjorn Loser with [facility].  Expected Discharge Plan: Home w Home Health Services Barriers to Discharge: Continued Medical Work up   Patient Goals and CMS Choice Patient states their goals for this hospitalization and ongoing recovery are:: home with [facility] CMS Medicare.gov Compare Post Acute Care list provided to:: Patient Choice offered to / list presented to : Patient      Expected Discharge Plan and Services       Living arrangements for the past 2 months: Single Family Home                           HH Arranged: RN [facility] Agency: [facility] Date [facility] Agency Contacted: 08/16/23   Representative spoke with at [facility] Agency: Bjorn Loser  Prior Living Arrangements/Services Living arrangements for the past 2 months: Single Family Home Lives with:: Adult Children, Spouse Patient language and need for interpreter reviewed:: Yes Do you feel safe going back to the place where you live?: Yes      Need for Family Participation in Patient Care: Yes (Comment) Care giver support system in place?: Yes (comment) Current home services: DME Criminal Activity/Legal Involvement Pertinent to Current Situation/Hospitalization: No - Comment as needed  Activities of Daily Living    ADL Screening (condition at time of admission) Independently performs ADLs?: Yes (appropriate for developmental age) Is the patient deaf or have difficulty hearing?: No Does the patient have difficulty seeing, even when wearing glasses/contacts?: No Does the patient have difficulty concentrating, remembering, or making decisions?: No  Permission Sought/Granted Permission sought to share information with : Facility Medical sales representative, Family Supports Permission granted to share information with : Yes, Verbal Permission Granted     Permission granted to share info w AGENCY: [facility]        Emotional Assessment       Orientation: : Oriented to Self, Oriented to Place, Oriented to  Time, Oriented to Situation Alcohol / Substance Use: Not Applicable Psych Involvement: No (comment)  Admission diagnosis:  Foot abscess, left [L02.612] Wound of left foot [S91.302A] Patient Active Problem List   Diagnosis Date Noted   Wound of left foot 08/13/2023   Hematoma of leg, left, sequela 05/07/2023   Sepsis due to gram-negative UTI (HCC) 02/25/2022   Acute metabolic encephalopathy 02/25/2022   Postherpetic neuralgia 02/25/2022   Productive cough 02/25/2022   Generalized weakness    Hyponatremia 02/21/2022   Gastroenteritis 02/21/2022   Low serum vitamin B12 10/09/2021   Leg pain 08/10/2019   Lymphedema 08/10/2019   Arthritis of knee 08/04/2019   Sciatica 08/04/2019   Elevated hemoglobin A1c 05/24/2019   Healthcare maintenance 05/24/2019   Prediabetes 07/19/2018  Ductal carcinoma in situ (DCIS) of right breast 11/05/2017   Open scalp wound 06/23/2016   GERD (gastroesophageal reflux disease) 02/29/2016   History of ductal carcinoma in situ (DCIS) of breast 02/29/2016   Post herpetic neuralgia 02/13/2016   Breast calcification, right 02/12/2016   Parotitis 01/22/2016   Adenopathy 01/01/2016   Lymphocytosis 01/01/2016   Periorbital cellulitis of right eye 12/24/2015   History  of shingles 12/21/2015   Anemia 12/14/2015   Subclinical hypothyroidism 10/11/2015   Recurrent cold sores 10/11/2015   BPPV (benign paroxysmal positional vertigo) 06/28/2015   Allergic rhinitis due to pollen 06/28/2015   Elevated uric acid in blood 06/08/2015   Venous insufficiency 09/29/2013   CLL (chronic lymphocytic leukemia) (HCC) 04/18/2010   PCP:  Lauro Regulus, MD Pharmacy:   [facility] [address] [facility] [place], [place] - [address] AT [facility] [address] [place] [address] Phone: [phone] Fax: [phone]     Social Drivers of Health (SDOH) Social History: SDOH Screenings   Food Insecurity: No Food Insecurity (08/13/2023)  Housing: Low Risk  (08/13/2023)  Transportation Needs: No Transportation Needs (08/13/2023)  Utilities: Not At Risk (08/13/2023)  Depression (PHQ2-9): Low Risk  (01/11/2019)  Financial Resource Strain: Low Risk  (07/23/2023)   Received from [facility]  Physical Activity: Insufficiently Active (01/11/2019)  Social Connections: Moderately Integrated (08/13/2023)  Stress: No Stress Concern Present (01/11/2019)  Tobacco Use: Low Risk  (08/13/2023)   SDOH Interventions:     Readmission Risk Interventions     No data to display

## 2023-08-16 NOTE — Plan of Care (Signed)

## 2023-08-16 NOTE — Progress Notes (Signed)
 Daily Progress Note   Subjective  - 2 Days Post-Op  Follow-up I&D abscess foot.  Some mild discomfort to the left foot today.  Objective Vitals:   08/15/23 0907 08/15/23 1634 08/15/23 2009 08/16/23 1007  BP: 95/76 (!) 100/47 (!) 116/52 130/64  Pulse: 81 70 71 63  Resp: 20 18  16   Temp: 97.8 F (36.6 C) 98.2 F (36.8 C)  97.9 F (36.6 C)  TempSrc:      SpO2: 100% 100% 97% 100%  Weight:      Height:        Physical Exam: Wound is stable.  No purulent drainage.  Minimal surrounding erythema.  Culture still showing no growth.  Laboratory CBC    Component Value Date/Time   WBC 7.0 08/15/2023 0325   HGB 10.7 (L) 08/15/2023 0325   HGB 11.0 (L) 09/26/2016 1507   HCT 32.6 (L) 08/15/2023 0325   HCT 34.1 09/26/2016 1507   PLT 210 08/15/2023 0325   PLT 267 09/26/2016 1507    BMET    Component Value Date/Time   NA 136 08/15/2023 0325   NA 141 02/13/2016 1049   K 4.2 08/15/2023 0325   CL 105 08/15/2023 0325   CO2 24 08/15/2023 0325   GLUCOSE 121 (H) 08/15/2023 0325   BUN 20 08/15/2023 0325   BUN 18 02/13/2016 1049   CREATININE 0.72 08/15/2023 0325   CALCIUM 8.5 (L) 08/15/2023 0325   GFRNONAA >60 08/15/2023 0325   GFRAA >60 02/06/2020 0957    Assessment/Planning: Abscess left foot status post I&D  New dressing applied today. Cultures are still benign at this point. Patient can weight-bear as tolerated in postoperative shoe.  Instructions in the discharge instructions tab were placed for dressing changes.  Recommend 3 times a week dressing changes with packing and bulky sterile dressing. Consult for care management to assist with home health for dressing changes. From podiatry standpoint patient is stable to discharge.  I recommended follow-up in 1 week. Recommend oral antibiotics upon discharge with broad-spectrum coverage such as doxycycline and Cipro.  Gwyneth Revels A  08/16/2023, 10:11 AM

## 2023-08-16 NOTE — Progress Notes (Signed)
  PROGRESS NOTE    Joanna Phillips  VHQ:[id number] DOB: Apr 16, 1951 DOA: 08/13/2023 PCP: Lauro Regulus, MD  130A/130A-AA  LOS: 3 days   Brief hospital course:   Assessment & Plan: Joanna Phillips is a 73 y.o. female with medical history significant of CLL, GERD, hx/o breast cancer, L foot wound presenting w/ L foot infection.  Patient reports initial diagnosis of left foot infection from February of this year.  Unclear if this was traumatic in etiology.  Has been followed by Dr. Ether Griffins for treatment.  Patient reports having course of antibiotics for treatment that has not helped.  No fevers or chills.  No nausea or vomiting.  Denies any known history of diabetes though has diagnosis of prediabetes documentation.  Non-smoker.  No alcohol abuse.  States that she has had worsening redness pain and swelling.  Was evaluated by podiatry earlier today with recommendation for come to the ER for further evaluation including MRI of the foot.    * nonhealing left foot wound with abscess S/p I/D on 3/28 Worsening left foot abscess with failed outpatient course of oral antibiotics with podiatry (Dr. Ether Griffins)  MRI of the left foot showed no osteo --cont IV cefepime, Flagyl vancomycin for infectious coverage --weight-bear as tolerated in postoperative shoe.  --3 times a week dressing changes with packing and bulky sterile dressing.  --outpatient f/u with podiatry in 1 week --discharge on doxy and Cipro   Lymphedema Followed by Dr. Gilda Crease outpatient  At baseline  Monitor   Sciatica --cont gabapentin   Prediabetes, not currently active --A1c 5.5 --no need for BG checks   GERD (gastroesophageal reflux disease) --cont PPI  CLL (chronic lymphocytic leukemia) (HCC) Followed by Dr. Smith Robert outpatient  Stable  Monitor    DVT prophylaxis: Lovenox SQ Code Status: Full code  Family Communication: husband updated at bedside today Level of care: Med-Surg Dispo:   The patient is from:  home Anticipated d/c is to: home Anticipated d/c date is: tomorrow   Subjective and Interval History:  Had another dressing change by podiatry today.  Pt reported more pain in her foot today but bearable.     Objective: Vitals:   08/15/23 1634 08/15/23 2009 08/16/23 1007 08/16/23 1546  BP: (!) 100/47 (!) 116/52 130/64 (!) 121/59  Pulse: 70 71 63 68  Resp: 18  16   Temp: 98.2 F (36.8 C)  97.9 F (36.6 C) 97.9 F (36.6 C)  TempSrc:      SpO2: 100% 97% 100% 97%  Weight:      Height:        Intake/Output Summary (Last 24 hours) at 08/16/2023 1750 Last data filed at 08/16/2023 1500 Gross per 24 hour  Intake 360 ml  Output --  Net 360 ml    Filed Weights   08/13/23 1110 08/13/23 1343  Weight: 88 kg 87.9 kg    Examination:   Constitutional: NAD, AAOx3 HEENT: conjunctivae and lids normal, EOMI CV: No cyanosis.   RESP: normal respiratory effort, on RA Extremities: left foot wrapped SKIN: warm, dry Neuro: II - XII grossly intact.   Psych: Normal mood and affect.  Appropriate judgement and reason   Data Reviewed: I have personally reviewed labs and imaging studies  Time spent: 35 minutes  Darlin Priestly, MD [facility] If 7PM-7AM, please contact night-coverage 08/16/2023, 5:50 PM

## 2023-08-17 ENCOUNTER — Other Ambulatory Visit: Payer: Self-pay

## 2023-08-17 ENCOUNTER — Encounter: Payer: Self-pay | Admitting: Podiatry

## 2023-08-17 DIAGNOSIS — S91302A Unspecified open wound, left foot, initial encounter: Secondary | ICD-10-CM | POA: Diagnosis not present

## 2023-08-17 LAB — CBC
HCT: 33.4 % — ABNORMAL LOW (ref 36.0–46.0)
Hemoglobin: 10.8 g/dL — ABNORMAL LOW (ref 12.0–15.0)
MCH: 26.7 pg (ref 26.0–34.0)
MCHC: 32.3 g/dL (ref 30.0–36.0)
MCV: 82.5 fL (ref 80.0–100.0)
Platelets: 199 10*3/uL (ref 150–400)
RBC: 4.05 MIL/uL (ref 3.87–5.11)
RDW: 14 % (ref 11.5–15.5)
WBC: 7.3 10*3/uL (ref 4.0–10.5)
nRBC: 0 % (ref 0.0–0.2)

## 2023-08-17 LAB — BASIC METABOLIC PANEL WITH GFR
Anion gap: 8 (ref 5–15)
BUN: 20 mg/dL (ref 8–23)
CO2: 25 mmol/L (ref 22–32)
Calcium: 8.6 mg/dL — ABNORMAL LOW (ref 8.9–10.3)
Chloride: 101 mmol/L (ref 98–111)
Creatinine, Ser: 0.75 mg/dL (ref 0.44–1.00)
GFR, Estimated: >60 mL/min (ref >60)
Glucose, Bld: 114 mg/dL — ABNORMAL HIGH (ref 70–99)
Potassium: 4.2 mmol/L (ref 3.5–5.1)
Sodium: 134 mmol/L — ABNORMAL LOW (ref 135–145)

## 2023-08-17 LAB — MAGNESIUM: Magnesium: 2.1 mg/dL (ref 1.7–2.4)

## 2023-08-17 MED ORDER — DOXYCYCLINE HYCLATE 100 MG PO TABS
100.0000 mg | ORAL_TABLET | Freq: Two times a day (BID) | ORAL | 0 refills | Status: AC
  Filled 2023-08-17: qty 28, 14d supply, fill #0

## 2023-08-17 MED ORDER — CIPROFLOXACIN HCL 750 MG PO TABS
750.0000 mg | ORAL_TABLET | Freq: Two times a day (BID) | ORAL | 0 refills | Status: AC
  Filled 2023-08-17: qty 28, 14d supply, fill #0

## 2023-08-17 MED ORDER — DOXYCYCLINE HYCLATE 100 MG PO TABS
100.0000 mg | ORAL_TABLET | Freq: Two times a day (BID) | ORAL | Status: DC
  Administered 2023-08-17: 100 mg via ORAL
  Filled 2023-08-17: qty 1

## 2023-08-17 MED ORDER — ADULT MULTIVITAMIN W/MINERALS CH: 1.0000 | ORAL_TABLET | Freq: Every day | ORAL | Status: AC

## 2023-08-17 MED ORDER — ASCORBIC ACID 500 MG PO TABS: 500.0000 mg | ORAL_TABLET | Freq: Two times a day (BID) | ORAL | Status: AC

## 2023-08-17 MED ORDER — ACETAMINOPHEN ER 650 MG PO TBCR: 650.0000 mg | EXTENDED_RELEASE_TABLET | Freq: Three times a day (TID) | ORAL | Status: AC | PRN

## 2023-08-17 MED ORDER — ZINC SULFATE 220 (50 ZN) MG PO CAPS: 220.0000 mg | ORAL_CAPSULE | Freq: Every day | ORAL | Status: AC

## 2023-08-17 MED ORDER — CIPROFLOXACIN HCL 500 MG PO TABS
750.0000 mg | ORAL_TABLET | Freq: Two times a day (BID) | ORAL | Status: DC
  Administered 2023-08-17: 750 mg via ORAL
  Filled 2023-08-17: qty 2

## 2023-08-17 NOTE — Plan of Care (Signed)
 Problem: Education: Goal: Knowledge of General Education information will improve Description: Including pain rating scale, medication(s)/side effects and non-pharmacologic comfort measures 08/17/2023 0614 by Alyce Pagan, RN Outcome: Progressing 08/17/2023 [phone] by Alyce Pagan, RN Outcome: Progressing   Problem: Health Behavior/Discharge Planning: Goal: Ability to manage health-related needs will improve 08/17/2023 0614 by Alyce Pagan, RN Outcome: Progressing 08/17/2023 [phone] by Alyce Pagan, RN Outcome: Progressing   Problem: Clinical Measurements: Goal: Ability to maintain clinical measurements within normal limits will improve 08/17/2023 0614 by Alyce Pagan, RN Outcome: Progressing 08/17/2023 [phone] by Alyce Pagan, RN Outcome: Progressing Goal: Will remain free from infection 08/17/[phone] by Alyce Pagan, RN Outcome: Progressing 08/17/2023 [phone] by Alyce Pagan, RN Outcome: Progressing Goal: Diagnostic test results will improve 08/17/2023 0614 by Alyce Pagan, RN Outcome: Progressing 08/17/2023 [phone] by Alyce Pagan, RN Outcome: Progressing Goal: Respiratory complications will improve 08/17/2023 0614 by Alyce Pagan, RN Outcome: Progressing 08/17/2023 [phone] by Alyce Pagan, RN Outcome: Progressing Goal: Cardiovascular complication will be avoided 08/17/[phone] by Alyce Pagan, RN Outcome: Progressing 08/17/2023 [phone] by Alyce Pagan, RN Outcome: Progressing   Problem: Activity: Goal: Risk for activity intolerance will decrease 08/17/2023 4132 by Alyce Pagan, RN Outcome: Progressing 08/17/2023 [phone] by Alyce Pagan, RN Outcome: Progressing   Problem: Nutrition: Goal: Adequate nutrition will be maintained 08/17/2023 0272 by Alyce Pagan, RN Outcome: Progressing 08/17/2023 [phone] by Alyce Pagan, RN Outcome: Progressing   Problem: Coping: Goal: Level of anxiety will decrease 08/17/2023 0614 by Alyce Pagan, RN Outcome:  Progressing 08/17/2023 [phone] by Alyce Pagan, RN Outcome: Progressing   Problem: Elimination: Goal: Will not experience complications related to bowel motility 08/17/2023 0614 by Alyce Pagan, RN Outcome: Progressing 08/17/2023 [phone] by Alyce Pagan, RN Outcome: Progressing Goal: Will not experience complications related to urinary retention 08/17/2023 0614 by Alyce Pagan, RN Outcome: Progressing 08/17/2023 [phone] by Alyce Pagan, RN Outcome: Progressing   Problem: Pain Managment: Goal: General experience of comfort will improve and/or be controlled 08/17/[phone] by Alyce Pagan, RN Outcome: Progressing 08/17/2023 [phone] by Alyce Pagan, RN Outcome: Progressing   Problem: Safety: Goal: Ability to remain free from injury will improve 08/17/2023 0614 by Alyce Pagan, RN Outcome: Progressing 08/17/2023 [phone] by Alyce Pagan, RN Outcome: Progressing   Problem: Skin Integrity: Goal: Risk for impaired skin integrity will decrease 08/17/2023 0614 by Alyce Pagan, RN Outcome: Progressing 08/17/2023 [phone] by Alyce Pagan, RN Outcome: Progressing   Problem: Clinical Measurements: Goal: Ability to avoid or minimize complications of infection will improve 08/17/2023 0614 by Alyce Pagan, RN Outcome: Progressing 08/17/2023 [phone] by Alyce Pagan, RN Outcome: Progressing   Problem: Skin Integrity: Goal: Skin integrity will improve 08/17/2023 0614 by Alyce Pagan, RN Outcome: Progressing 08/17/2023 [phone] by Alyce Pagan, RN Outcome: Progressing   Problem: Education: Goal: Ability to describe self-care measures that may prevent or decrease complications (Diabetes Survival Skills Education) will improve 08/17/2023 0109 by Alyce Pagan, RN Outcome: Progressing 08/17/2023 [phone] by Alyce Pagan, RN Outcome: Progressing Goal: Individualized Educational Video(s) 08/17/2023 [phone] by Alyce Pagan, RN Outcome: Progressing 08/17/2023 [phone] by Alyce Pagan,  RN Outcome: Progressing   Problem: Coping: Goal: Ability to adjust to condition or change in health will improve 08/17/2023 0614 by Alyce Pagan, RN Outcome: Progressing 08/17/2023 [phone] by Alyce Pagan, RN Outcome: Progressing  Problem: Fluid Volume: Goal: Ability to maintain a balanced intake and output will improve 08/17/[phone] by Alyce Pagan, RN Outcome: Progressing 08/17/2023 [phone] by Alyce Pagan, RN Outcome: Progressing   Problem: Health Behavior/Discharge Planning: Goal: Ability to identify and utilize available resources and services will improve 08/17/2023 0614 by Alyce Pagan, RN Outcome: Progressing 08/17/2023 [phone] by Alyce Pagan, RN Outcome: Progressing Goal: Ability to manage health-related needs will improve 08/17/2023 0614 by Alyce Pagan, RN Outcome: Progressing 08/17/2023 [phone] by Alyce Pagan, RN Outcome: Progressing   Problem: Metabolic: Goal: Ability to maintain appropriate glucose levels will improve 08/17/2023 0614 by Alyce Pagan, RN Outcome: Progressing 08/17/2023 [phone] by Alyce Pagan, RN Outcome: Progressing   Problem: Nutritional: Goal: Maintenance of adequate nutrition will improve 08/17/[phone] by Alyce Pagan, RN Outcome: Progressing 08/17/2023 [phone] by Alyce Pagan, RN Outcome: Progressing Goal: Progress toward achieving an optimal weight will improve 08/17/[phone] by Alyce Pagan, RN Outcome: Progressing 08/17/2023 [phone] by Alyce Pagan, RN Outcome: Progressing   Problem: Skin Integrity: Goal: Risk for impaired skin integrity will decrease 08/17/2023 0630 by Alyce Pagan, RN Outcome: Progressing 08/17/2023 [phone] by Alyce Pagan, RN Outcome: Progressing   Problem: Tissue Perfusion: Goal: Adequacy of tissue perfusion will improve 08/17/2023 0932 by Alyce Pagan, RN Outcome: Progressing 08/17/2023 0613 by Alyce Pagan, RN Outcome: Progressing

## 2023-08-17 NOTE — TOC Transition Note (Signed)
 Transition of Care [facility]) - Discharge Note   Patient Details  Name: Joanna Phillips MRN: [id number] Date of Birth: 07/01/1950  Transition of Care [facility]) CM/SW Contact:  Cherre Blanc, RN Phone Number: 08/17/2023, 10:18 AM   Clinical Narrative:     Patient is medically clear for dc to home with home health Skilled Nursing. Home Health arranged with [facility]. Meds will be delivered to the patient's room by the in house pharmacy. Patients husband will transport her home.   Final next level of care: Home w Home Health Services Barriers to Discharge: Continued Medical Work up   Patient Goals and CMS Choice Patient states their goals for this hospitalization and ongoing recovery are:: home with [facility] CMS Medicare.gov Compare Post Acute Care list provided to:: Patient Choice offered to / list presented to : Patient      Discharge Placement                       Discharge Plan and Services Additional resources added to the After Visit Summary for                            [facility] Arranged: RN [facility] Agency: [facility] Date [facility] Agency Contacted: 08/16/23   Representative spoke with at [facility] Agency: Bjorn Loser  Social Drivers of Health (SDOH) Interventions SDOH Screenings   Food Insecurity: No Food Insecurity (08/13/2023)  Housing: Low Risk  (08/13/2023)  Transportation Needs: No Transportation Needs (08/13/2023)  Utilities: Not At Risk (08/13/2023)  Depression (PHQ2-9): Low Risk  (01/11/2019)  Financial Resource Strain: Low Risk  (07/23/2023)   Received from [facility]  Physical Activity: Insufficiently Active (01/11/2019)  Social Connections: Moderately Integrated (08/13/2023)  Stress: No Stress Concern Present (01/11/2019)  Tobacco Use: Low Risk  (08/13/2023)     Readmission Risk Interventions     No data to display

## 2023-08-17 NOTE — Discharge Summary (Signed)
 Physician Discharge Summary   Joanna Phillips  female DOB: [birth year]  ZOX:[id number]  PCP: Lauro Regulus, MD  Admit date: 08/13/2023 Discharge date: 08/17/2023  Admitted From: home Disposition:  home Home Health: Yes CODE STATUS: Full code  Discharge Instructions     Discharge instructions   Complete by: As directed    Dr. Ether Griffins wants you to take antibiotic Cipro and doxycycline and follow up with Dr. Ether Griffins about 1 week after discharge.  weight-bear as tolerated in postoperative shoe. - -   Discharge wound care:   Complete by: As directed    Left foot:  3 x's per week.  Flush with water.  Pack with iodoform packing and cover with bulky gauze dressing.  [facility] Course:  For full details, please see H&P, progress notes, consult notes and ancillary notes.  Briefly,  Joanna Phillips is a 73 y.o. female with medical history significant of CLL, hx/o breast cancer, L foot wound presenting w/ L foot infection.    Patient reports initial diagnosis of left foot infection from February of this year.  Unclear if this was traumatic in etiology.  Has been followed by Dr. Ether Griffins for treatment.  Patient reports having course of antibiotics for treatment that has not helped.  States that she has had worsening redness pain and swelling.  Was evaluated by podiatry earlier with recommendation for come to the ER for further evaluation including MRI of the foot.    * nonhealing left foot wound with abscess S/p I/D on 3/28 Worsening left foot abscess with failed outpatient course of oral antibiotics with podiatry (Dr. Ether Griffins)  MRI of the left foot showed no osteo --received IV cefepime, Flagyl vancomycin while inpatient, was discharged on Cipro and doxycycline to be followed up with Dr. Ether Griffins 1 week after discharge. --weight-bear as tolerated in postoperative shoe.  --3 times a week dressing changes with packing and bulky sterile dressing.  --outpatient f/u with podiatry in 1  week  Lymphedema Followed by Dr. Gilda Crease outpatient  At baseline    Sciatica --cont gabapentin   Prediabetes, not currently active --A1c 5.5 --no need for BG checks    GERD (gastroesophageal reflux disease) --cont PPI   CLL (chronic lymphocytic leukemia) (HCC) Followed by Dr. Smith Robert outpatient  Stable    Unless noted above, medications under "STOP" list are ones pt was not taking PTA.  Discharge Diagnoses:  Principal Problem:   Wound of left foot Active Problems:   CLL (chronic lymphocytic leukemia) (HCC)   GERD (gastroesophageal reflux disease)   Prediabetes   Sciatica   Lymphedema   30 Day Unplanned Readmission Risk Score    Flowsheet Row ED to Hosp-Admission (Current) from 08/13/2023 in [facility] 1C MEDICAL TELEMETRY  30 Day Unplanned Readmission Risk Score (%) 14.25 Filed at 08/17/2023 0801       This score is the patient's risk of an unplanned readmission within 30 days of being discharged (0 -100%). The score is based on dignosis, age, lab data, medications, orders, and past utilization.   Low:  0-14.9   Medium: 15-21.9   High: 22-29.9   Extreme: 30 and above         Discharge Instructions:  Allergies as of 08/17/2023       Reactions   Amoxicillin Hives   Hydrocodone Itching   Sulfamethoxazole-trimethoprim Nausea Only   Allopurinol Rash        Medication List  STOP taking these medications    ondansetron 4 MG disintegrating tablet Commonly known as: ZOFRAN-ODT       TAKE these medications    acetaminophen 650 MG CR tablet Commonly known as: TYLENOL Take 1 tablet (650 mg total) by mouth every 8 (eight) hours as needed for pain. What changed:  how much to take when to take this reasons to take this   ascorbic acid 500 MG tablet Commonly known as: VITAMIN C Take 1 tablet (500 mg total) by mouth 2 (two) times daily.   aspirin EC 81 MG tablet Take 81 mg by mouth daily.   calcium-vitamin D 500-200 MG-UNIT  Tabs tablet Commonly known as: OSCAL WITH D Take 1 tablet by mouth daily.   ciprofloxacin 750 MG tablet Commonly known as: CIPRO Take 1 tablet (750 mg total) by mouth 2 (two) times daily for 14 days.   doxycycline 100 MG tablet Commonly known as: VIBRA-TABS Take 1 tablet (100 mg total) by mouth every 12 (twelve) hours for 14 days.   fluticasone 50 MCG/ACT nasal spray Commonly known as: FLONASE Place into the nose.   gabapentin 800 MG tablet Commonly known as: NEURONTIN Take 1 tablet (800 mg total) by mouth 3 (three) times daily. Home med.   multivitamin with minerals Tabs tablet Take 1 tablet by mouth daily. Start taking on: August 18, 2023   omeprazole 20 MG capsule Commonly known as: PRILOSEC Take 1 capsule by mouth once daily   OXcarbazepine 150 MG tablet Commonly known as: TRILEPTAL Take 300 mg by mouth 2 (two) times daily.   REFRESH CELLUVISC OP Apply 1 drop to eye every 4 (four) hours as needed.   vitamin B-12 250 MCG tablet Commonly known as: CYANOCOBALAMIN Take 250 mcg by mouth daily.   zinc sulfate (50mg  elemental zinc) 220 (50 Zn) MG capsule Take 1 capsule (220 mg total) by mouth daily. Start taking on: August 18, 2023               Discharge Care Instructions  (From admission, onward)           Start     Ordered   08/17/23 0000  Discharge wound care:       Comments: Left foot:  3 x's per week.  Flush with water.  Pack with iodoform packing and cover with bulky gauze dressing.  - -   08/17/23 0928             Follow-up Information     Lauro Regulus, MD Follow up in 1 week(s).   Specialty: Internal Medicine Why: Hospital follow up Contact information: [address] [facility] [place] [address] [phone]         Gwyneth Revels, DPM Follow up in 1 week(s).   Specialty: Podiatry Contact information: [address] [place] [address] [phone]                 Allergies   Allergen Reactions   Amoxicillin Hives   Hydrocodone Itching   Sulfamethoxazole-Trimethoprim Nausea Only   Allopurinol Rash     The results of significant diagnostics from this hospitalization (including imaging, microbiology, ancillary and laboratory) are listed below for reference.   Consultations:   Procedures/Studies: MR FOOT LEFT WO CONTRAST Result Date: 08/13/2023 CLINICAL DATA:  Left foot infection since 06/2023. Worsening. Has been on antibiotics but they have not helped. EXAM: MRI OF THE LEFT FOOT WITHOUT CONTRAST TECHNIQUE: Multiplanar, multisequence MR imaging of the  left forefoot was performed. No intravenous contrast was administered. COMPARISON:  left foot radiographs 08/13/2023 FINDINGS: Bones/Joint/Cartilage On coronal and sagittal STIR images with the best homogeneity of fat suppression, no marrow edema is seen suspicious for acute osteomyelitis. Artifactual areas of inhomogeneous fat suppression and increased marrow signal throughout all of the toes on the T2 and proton density fat saturation sequences. Mild great toe metatarsophalangeal cartilage thinning and peripheral osteophytosis. Ligaments The Lisfranc ligament complex is intact. The metatarsophalangeal and interphalangeal collateral ligaments are intact. Muscles and Tendons Normal signal and size of the regional musculature. No tendon tear is seen. Soft tissues There is mild subcutaneous fat edema and swelling throughout the dorsolateral midfoot, greatest at the level of the metatarsal shafts. No walled-off fluid collection to indicate an abscess. IMPRESSION: 1. Mild subcutaneous fat edema and swelling throughout the dorsolateral midfoot, greatest at the level of the metatarsal shafts. No walled-off fluid collection to indicate an abscess. 2. No evidence of acute osteomyelitis. Electronically Signed   By: Neita Garnet M.D.   On: 08/13/2023 16:20   DG Foot Complete Left Result Date: 08/13/2023 CLINICAL DATA:  Infection  and dorsal surface of left foot since 06/2023. Antibiotics are not helped. Symptoms have worsened. Dressing in place. EXAM: LEFT FOOT - COMPLETE 3+ VIEW COMPARISON:  None Available. FINDINGS: There is diffuse decreased bone mineralization. Small plantar and posterior calcaneal heel spurs. No soft tissue ulcer or subcutaneous air is seen. No cortical erosion. No acute fracture or dislocation. IMPRESSION: 1. No radiographic evidence of osteomyelitis. 2. Small plantar and posterior calcaneal heel spurs. Electronically Signed   By: Neita Garnet M.D.   On: 08/13/2023 12:33      Labs: BNP (last 3 results) No results for input(s): "BNP" in the last 8760 hours. Basic Metabolic Panel: Recent Labs  Lab 08/13/23 1117 08/14/23 0625 08/15/23 0325 08/17/23 0633  NA 135 137 136 134*  K 4.7 4.3 4.2 4.2  CL 102 105 105 101  CO2 25 23 24 25   GLUCOSE 114* 98 121* 114*  BUN 28* 21 20 20   CREATININE 0.83 0.69 0.72 0.75  CALCIUM 9.2 8.6* 8.5* 8.6*  MG  --   --  2.0 2.1   Liver Function Tests: Recent Labs  Lab 08/13/23 1117 08/14/23 0625  AST 21 15  ALT 15 11  ALKPHOS 101 69  BILITOT 0.7 0.6  PROT 7.0 5.7*  ALBUMIN 4.2 3.2*   No results for input(s): "LIPASE", "AMYLASE" in the last 168 hours. No results for input(s): "AMMONIA" in the last 168 hours. CBC: Recent Labs  Lab 08/13/23 1200 08/14/23 0625 08/15/23 0325 08/17/23 0633  WBC 12.1* 5.7 7.0 7.3  NEUTROABS 3.7  --   --   --   HGB 12.4 11.2* 10.7* 10.8*  HCT 39.0 34.0* 32.6* 33.4*  MCV 84.4 82.3 83.2 82.5  PLT 246 191 210 199   Cardiac Enzymes: No results for input(s): "CKTOTAL", "CKMB", "CKMBINDEX", "TROPONINI" in the last 168 hours. BNP: Invalid input(s): "POCBNP" CBG: Recent Labs  Lab 08/14/23 1557 08/14/23 1736 08/14/23 2123 08/15/23 0904 08/15/23 1145  GLUCAP 93 85 119* 209* 86   D-Dimer No results for input(s): "DDIMER" in the last 72 hours. Hgb A1c No results for input(s): "HGBA1C" in the last 72 hours. Lipid  Profile No results for input(s): "CHOL", "HDL", "LDLCALC", "TRIG", "CHOLHDL", "LDLDIRECT" in the last 72 hours. Thyroid function studies No results for input(s): "TSH", "T4TOTAL", "T3FREE", "THYROIDAB" in the last 72 hours.  Invalid input(s): "FREET3" Anemia work  up No results for input(s): "VITAMINB12", "FOLATE", "FERRITIN", "TIBC", "IRON", "RETICCTPCT" in the last 72 hours. Urinalysis    Component Value Date/Time   COLORURINE YELLOW (A) 02/24/2022 1428   APPEARANCEUR TURBID (A) 02/24/2022 1428   LABSPEC 1.016 02/24/2022 1428   PHURINE 5.0 02/24/2022 1428   GLUCOSEU NEGATIVE 02/24/2022 1428   HGBUR MODERATE (A) 02/24/2022 1428   BILIRUBINUR NEGATIVE 02/24/2022 1428   BILIRUBINUR neg 03/17/2016 1103   KETONESUR NEGATIVE 02/24/2022 1428   PROTEINUR 100 (A) 02/24/2022 1428   UROBILINOGEN 0.2 03/17/2016 1103   NITRITE NEGATIVE 02/24/2022 1428   LEUKOCYTESUR LARGE (A) 02/24/2022 1428   Sepsis Labs Recent Labs  Lab 08/13/23 1200 08/14/23 0625 08/15/23 0325 08/17/23 0633  WBC 12.1* 5.7 7.0 7.3   Microbiology Recent Results (from the past 240 hours)  Blood culture (routine x 2)     Status: None (Preliminary result)   Collection Time: 08/13/23 11:17 AM   Specimen: BLOOD  Result Value Ref Range Status   Specimen Description BLOOD BLOOD RIGHT ARM  Final   Special Requests   Final    BOTTLES DRAWN AEROBIC AND ANAEROBIC Blood Culture results may not be optimal due to an inadequate volume of blood received in culture bottles   Culture   Final    NO GROWTH 4 DAYS Performed at [facility], [address] [place], [place] [address]    Report Status PENDING  Incomplete  Blood culture (routine x 2)     Status: None (Preliminary result)   Collection Time: 08/13/23 11:17 AM   Specimen: BLOOD  Result Value Ref Range Status   Specimen Description BLOOD BLOOD RIGHT ARM  Final   Special Requests   Final    BOTTLES DRAWN AEROBIC AND ANAEROBIC Blood Culture adequate volume    Culture   Final    NO GROWTH 4 DAYS Performed at [facility], [address] [place], [place] [address]    Report Status PENDING  Incomplete  Aerobic/Anaerobic Culture w Gram Stain (surgical/deep wound)     Status: None (Preliminary result)   Collection Time: 08/14/23  3:22 PM   Specimen: Path Tissue  Result Value Ref Range Status   Specimen Description ULCER TOE LEFT  Final   Special Requests 3RD LEFT TOE  Final   Gram Stain   Final    FEW WBC PRESENT,BOTH PMN AND MONONUCLEAR NO ORGANISMS SEEN    Culture   Final    NO GROWTH 3 DAYS NO ANAEROBES ISOLATED; CULTURE IN PROGRESS FOR 5 DAYS Performed at [facility], [address] [place], [place] [address]    Report Status PENDING  Incomplete  Aerobic/Anaerobic Culture w Gram Stain (surgical/deep wound)     Status: None (Preliminary result)   Collection Time: 08/14/23  3:24 PM   Specimen: Path Tissue  Result Value Ref Range Status   Specimen Description TISSUE LEFT TOE  Final   Special Requests LEFT 3RD TOE  Final   Gram Stain   Final    FEW WBC PRESENT,BOTH PMN AND MONONUCLEAR NO ORGANISMS SEEN    Culture   Final    NO GROWTH 3 DAYS NO ANAEROBES ISOLATED; CULTURE IN PROGRESS FOR 5 DAYS Performed at [facility], [address], [place], [place] [address]    Report Status PENDING  Incomplete  Acid Fast Smear (AFB)     Status: None   Collection Time: 08/14/23  3:24 PM   Specimen: Path Tissue  Result Value Ref Range  Status   AFB Specimen Processing Comment  Final    Comment: Tissue Grinding and Digestion/Decontamination   Acid Fast Smear Negative  Final    Comment: (NOTE) Performed At: [facility] [place] [address] [place], [place] [id number] Jolene Schimke MD WU:[id number]    Source (AFB) TISSUE  Final    Comment: Performed at [facility], [address] [place] [place], [place] [address]     Total time spend on discharging this patient, including the last patient exam, discussing the  hospital stay, instructions for ongoing care as it relates to all pertinent caregivers, as well as preparing the medical discharge records, prescriptions, and/or referrals as applicable, is 35 minutes.    Darlin Priestly, MD  [facility] 08/17/2023, 9:28 AM

## 2023-08-17 NOTE — Anesthesia Postprocedure Evaluation (Addendum)
 Anesthesia Post Note  Patient: Joanna Phillips  Procedure(s) Performed: IRRIGATION AND DEBRIDEMENT FOOT (Left)  Patient location during evaluation: PACU Anesthesia Type: General Level of consciousness: awake and alert Pain management: pain level controlled Vital Signs Assessment: post-procedure vital signs reviewed and stable Respiratory status: spontaneous breathing, nonlabored ventilation and respiratory function stable Cardiovascular status: blood pressure returned to baseline and stable Postop Assessment: no apparent nausea or vomiting Anesthetic complications: no   No notable events documented.   Last Vitals:  Vitals:   08/17/23 0432 08/17/23 0904  BP: (!) 106/51 (!) 113/46  Pulse: 66 85  Resp: 19 18  Temp: 37 C 36.5 C  SpO2: 98% 100%    Last Pain:  Vitals:   08/17/23 0844  TempSrc:   PainSc: 10-Worst pain ever                 Foye Deer

## 2023-08-18 LAB — CULTURE, BLOOD (ROUTINE X 2)
Culture: NO GROWTH
Culture: NO GROWTH
Special Requests: ADEQUATE

## 2023-08-18 LAB — SURGICAL PATHOLOGY

## 2023-08-19 LAB — AEROBIC/ANAEROBIC CULTURE W GRAM STAIN (SURGICAL/DEEP WOUND): Culture: NO GROWTH

## 2023-08-20 ENCOUNTER — Telehealth: Payer: Self-pay | Admitting: Pharmacist

## 2023-08-20 ENCOUNTER — Other Ambulatory Visit (HOSPITAL_COMMUNITY)
Admission: RE | Admit: 2023-08-20 | Discharge: 2023-08-20 | Disposition: A | Discharge status: Home / Self Care | Attending: Podiatry | Admitting: Podiatry

## 2023-08-20 ENCOUNTER — Other Ambulatory Visit: Payer: Self-pay | Admitting: Pharmacist

## 2023-08-20 DIAGNOSIS — L02612 Cutaneous abscess of left foot: Secondary | ICD-10-CM | POA: Insufficient documentation

## 2023-08-20 NOTE — Progress Notes (Signed)
 Attempted to place order per microbiology request but unable to do so since patient discharged.

## 2023-08-26 ENCOUNTER — Telehealth: Payer: Self-pay

## 2023-08-26 NOTE — Telephone Encounter (Signed)
 Patient scheduled for 09/01/23 per patient's request

## 2023-08-26 NOTE — Telephone Encounter (Signed)
-----   Message from Lynn Ito sent at 08/24/2023  4:44 PM EDT ----- Regarding: RE: Culture result Clarita Mcelvain can we schedule her for Thursday? Thx ----- Message ----- From: Gwyneth Revels, DPM Sent: 08/22/2023   8:49 AM EDT To: Lynn Ito, MD Subject: Culture result                                 I performed an I&D of a nonhealing ulcer on this patient last week.  Please see culture result.  I suspect she will need follow-up with you for further recommendations for antibiotics. ----- Message ----- From: Interface, [facility] [place] Sent: 08/21/2023   8:35 PM EDT To: Gwyneth Revels, DPM

## 2023-09-01 ENCOUNTER — Ambulatory Visit: Attending: Infectious Diseases | Admitting: Infectious Diseases

## 2023-09-01 ENCOUNTER — Encounter: Payer: Self-pay | Admitting: Infectious Diseases

## 2023-09-01 ENCOUNTER — Other Ambulatory Visit
Admission: RE | Admit: 2023-09-01 | Discharge: 2023-09-01 | Disposition: A | Discharge status: Home / Self Care | Source: Ambulatory Visit | Attending: Infectious Diseases | Admitting: Infectious Diseases

## 2023-09-01 VITALS — BP 98/64 | HR 96 | Temp 97.4°F

## 2023-09-01 DIAGNOSIS — A311 Cutaneous mycobacterial infection: Secondary | ICD-10-CM | POA: Insufficient documentation

## 2023-09-01 DIAGNOSIS — Z853 Personal history of malignant neoplasm of breast: Secondary | ICD-10-CM | POA: Insufficient documentation

## 2023-09-01 DIAGNOSIS — B0222 Postherpetic trigeminal neuralgia: Secondary | ICD-10-CM | POA: Diagnosis present

## 2023-09-01 DIAGNOSIS — L97528 Non-pressure chronic ulcer of other part of left foot with other specified severity: Secondary | ICD-10-CM | POA: Diagnosis not present

## 2023-09-01 DIAGNOSIS — C9110 Chronic lymphocytic leukemia of B-cell type not having achieved remission: Secondary | ICD-10-CM | POA: Insufficient documentation

## 2023-09-01 DIAGNOSIS — L089 Local infection of the skin and subcutaneous tissue, unspecified: Secondary | ICD-10-CM | POA: Diagnosis not present

## 2023-09-01 DIAGNOSIS — A431 Cutaneous nocardiosis: Secondary | ICD-10-CM | POA: Diagnosis not present

## 2023-09-01 DIAGNOSIS — B948 Sequelae of other specified infectious and parasitic diseases: Secondary | ICD-10-CM | POA: Diagnosis not present

## 2023-09-01 LAB — CBC WITH DIFFERENTIAL/PLATELET
Abs Immature Granulocytes: 0.06 10*3/uL (ref 0.00–0.07)
Basophils Absolute: 0 10*3/uL (ref 0.0–0.1)
Basophils Relative: 0 %
Eosinophils Absolute: 0.1 10*3/uL (ref 0.0–0.5)
Eosinophils Relative: 1 %
HCT: 35.6 % — ABNORMAL LOW (ref 36.0–46.0)
Hemoglobin: 11.9 g/dL — ABNORMAL LOW (ref 12.0–15.0)
Immature Granulocytes: 1 %
Lymphocytes Relative: 48 %
Lymphs Abs: 5.4 10*3/uL — ABNORMAL HIGH (ref 0.7–4.0)
MCH: 27.4 pg (ref 26.0–34.0)
MCHC: 33.4 g/dL (ref 30.0–36.0)
MCV: 81.8 fL (ref 80.0–100.0)
Monocytes Absolute: 1 10*3/uL (ref 0.1–1.0)
Monocytes Relative: 9 %
Neutro Abs: 4.5 10*3/uL (ref 1.7–7.7)
Neutrophils Relative %: 41 %
Platelets: 313 10*3/uL (ref 150–400)
RBC: 4.35 MIL/uL (ref 3.87–5.11)
RDW: 14.8 % (ref 11.5–15.5)
Smear Review: NORMAL
WBC: 11.1 10*3/uL — ABNORMAL HIGH (ref 4.0–10.5)
nRBC: 0 % (ref 0.0–0.2)

## 2023-09-01 LAB — COMPREHENSIVE METABOLIC PANEL WITH GFR
ALT: 21 U/L (ref 0–44)
AST: 24 U/L (ref 15–41)
Albumin: 3.8 g/dL (ref 3.5–5.0)
Alkaline Phosphatase: 84 U/L (ref 38–126)
Anion gap: 7 (ref 5–15)
BUN: 25 mg/dL — ABNORMAL HIGH (ref 8–23)
CO2: 24 mmol/L (ref 22–32)
Calcium: 8.9 mg/dL (ref 8.9–10.3)
Chloride: 102 mmol/L (ref 98–111)
Creatinine, Ser: 0.61 mg/dL (ref 0.44–1.00)
GFR, Estimated: >60 mL/min (ref >60)
Glucose, Bld: 98 mg/dL (ref 70–99)
Potassium: 4.7 mmol/L (ref 3.5–5.1)
Sodium: 133 mmol/L — ABNORMAL LOW (ref 135–145)
Total Bilirubin: 0.2 mg/dL (ref 0.0–1.2)
Total Protein: 6.5 g/dL (ref 6.5–8.1)

## 2023-09-01 LAB — C-REACTIVE PROTEIN: CRP: 0.9 mg/dL (ref <1.0)

## 2023-09-01 LAB — SEDIMENTATION RATE: Sed Rate: 18 mm/h (ref 0–30)

## 2023-09-01 MED ORDER — ONDANSETRON HCL 4 MG PO TABS: 4.0000 mg | ORAL_TABLET | Freq: Two times a day (BID) | ORAL | 0 refills | Status: DC | PRN

## 2023-09-01 MED ORDER — DOXYCYCLINE MONOHYDRATE 100 MG PO TABS: 100.0000 mg | ORAL_TABLET | Freq: Two times a day (BID) | ORAL | 0 refills | Status: DC

## 2023-09-01 MED ORDER — SULFAMETHOXAZOLE-TRIMETHOPRIM 800-160 MG PO TABS: 1.0000 | ORAL_TABLET | Freq: Two times a day (BID) | ORAL | 0 refills | Status: DC

## 2023-09-01 MED ORDER — AZITHROMYCIN 500 MG PO TABS: 500.0000 mg | ORAL_TABLET | Freq: Every day | ORAL | 0 refills | Status: DC

## 2023-09-01 NOTE — Patient Instructions (Signed)
 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 We also reviewed your chronic lymphocytic leukemia, postherpetic neuralgia, and history of breast cancer. A comprehensive treatment plan was outlined to address each of these conditions.  YOUR PLAN:  -MYCOBACTERIUM CHELONAE FOOT INFECTION: or nocardia This is a chronic infection in your left foot caused by a type of bacteria can be  associated with pedicures. Despite previous antibiotics, new lesions and swelling have developed. We will start a new antibiotic regimen with doxycycline, sulfa, and azithromycin for 3-6 months. If you experience nausea, we can provide medication to help manage it. It's important to avoid pet contact with your foot and keep it bandaged to prevent further infection. We will monitor your progress over the next 1-2 weeks and consider intravenous antibiotics if there is no improvement. A repeat MRI may be necessary if the infection does not respond to treatment.  -CHRONIC LYMPHOCYTIC LEUKEMIA (CLL): CLL is a type of cancer that affects your blood and bone marrow. Your condition is currently being monitored without active treatment. Regular blood tests will continue to check for any changes. We will contact your oncologist to check your immunoglobulin levels, as low levels may contribute to prolonged infections. If needed, an immunoglobulin infusion may be considered.  -POSTHERPETIC NEURALGIA: This is persistent pain that occurs after a shingles infection. You have been experiencing this pain on the right side of your head and neck since 2017. Continue taking gabapentin and oxcarbazepine as prescribed to manage the pain.  -BREAST CANCER (RIGHT SIDE, IN SITU): You had right-sided breast cancer treated with surgery and radiation in 2018. The cancer was in situ, meaning it had not spread. No further treatment is required at this  time.  INSTRUCTIONS:  Please follow the new antibiotic regimen as prescribed and monitor your symptoms closely. If you experience any worsening of your condition or new symptoms, contact our office immediately. We will coordinate with [facility] for your final culture results and follow up with your oncologist regarding your immunoglobulin levels. Keep your foot bandaged and avoid contact with pets to prevent further infection. We will reassess your condition in 1-2 weeks to determine if additional treatment is necessary.

## 2023-09-01 NOTE — Progress Notes (Signed)
 NAME: Joanna Phillips  DOB: [birth year]  MRN: [id number]  Date/Time: 09/01/2023 9:56 AM   Subjective:  Referred by Dr.Fowler Pt here with her husband Pt consented for AI scribe use ?  [name] is a 73 year old female with chronic lymphocytic leukemia, rt ca breast DCIS s/p lumpectomy who presents with a left foot infection. She was referred by Dr. Althea Atkinson for evaluation of her persistent foot infection.  The left foot infection began before February with a small bump that progressively enlarged. Surgical intervention was performed on August 11, 2023, to excise the infection. Post-surgery, she received home health care for wound dressing three times a week. Despite initial improvement, new red bumps and swelling have developed around the incision site, with some areas feeling warm and bruised. She completed a course of Ciprofloxacin and Doxycycline, which initially seemed to help, but new lesions have appeared. She experiences pain behind her toes and swelling in the foot, which worsens with pressure. 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 She takes gabapentin 800 mg three times a day and oxcarbazepine three times a day for pain management. She also takes omeprazole 20 mg daily.  Her past medical history includes chronic lymphocytic leukemia diagnosed in 2011 or 2012, currently under observation without active treatment. She also had right-sided breast cancer in 2018, treated with surgery and radiation, but no chemotherapy. No history of diabetes, hypertension, or heart issues.   She has not engaged in yard work or been scratched by pets. She had a pedicure earlier this year, performed by her licensed niece, which involved soaking her feet in a whirlpool.  She completed her antibiotics last night and reports no  significant change in her symptoms since then. She experiences nausea, which she attributes to Ciprofloxacin, but has no known allergies to sulfa drugs. No fever, night sweats, or weight loss. She has a cat and a dog at home  Past Medical History:  Diagnosis Date   Arthritis    Breast cancer (HCC) 2017   DCIS   CLL (chronic lymphocytic leukemia) (HCC) 2011   DCIS (ductal carcinoma in situ) 03/11/2016   GERD (gastroesophageal reflux disease)    Hyponatremia    Personal history of radiation therapy 2017    Past Surgical History:  Procedure Laterality Date   BREAST BIOPSY Right 02/12/2016   DCIS   BREAST BIOPSY Right 12/27/2019   path pending, calcs, x marker   BREAST LUMPECTOMY Right 03/11/2016   DCIS neg margins   CHOLECYSTECTOMY  1996   COLONOSCOPY  2013   COLONOSCOPY WITH PROPOFOL N/A 04/19/2021   Procedure: COLONOSCOPY WITH PROPOFOL;  Surgeon: Quintin Buckle, DO;  Location: [facility] ENDOSCOPY;  Service: Gastroenterology;  Laterality: N/A;   IRRIGATION AND DEBRIDEMENT FOOT Left 08/14/2023   Procedure: IRRIGATION AND DEBRIDEMENT FOOT;  Surgeon: Anell Baptist, DPM;  Location: [facility] ORS;  Service: Orthopedics/Podiatry;  Laterality: Left;   squamous cell skin on scalp surgery Right     Social History   Socioeconomic History   Marital status: Married    Spouse name: Not on file   Number of children: Not on file   Years of education: Not on file   Highest education level: Not on file  Occupational History   Occupation: retired   Tobacco Use   Smoking status: Never  Smokeless tobacco: Never  Vaping Use   Vaping status: Never Used  Substance and Sexual Activity   Alcohol use: No   Drug use: No   Sexual activity: Not on file  Other Topics Concern   Not on file  Social History Narrative   Not on file   Social Drivers of Health   Financial Resource Strain: Low Risk  (07/23/2023)   Received from [facility]   Overall Financial Resource Strain (CARDIA)     Difficulty of Paying Living Expenses: Not hard at all  Food Insecurity: No Food Insecurity (08/13/2023)   Hunger Vital Sign    Worried About Running Out of Food in the Last Year: Never true    Ran Out of Food in the Last Year: Never true  Transportation Needs: No Transportation Needs (08/13/2023)   PRAPARE - Administrator, Civil Service (Medical): No    Lack of Transportation (Non-Medical): No  Physical Activity: Insufficiently Active (01/11/2019)   Exercise Vital Sign    Days of Exercise per Week: 3 days    Minutes of Exercise per Session: 30 min  Stress: No Stress Concern Present (01/11/2019)   [employer] of Occupational Health - Occupational Stress Questionnaire    Feeling of Stress : Not at all  Social Connections: Moderately Integrated (08/13/2023)   Social Connection and Isolation Panel [NHANES]    Frequency of Communication with Friends and Family: More than three times a week    Frequency of Social Gatherings with Friends and Family: More than three times a week    Attends Religious Services: More than 4 times per year    Active Member of [employer] or Organizations: No    Attends Banker Meetings: Never    Marital Status: Married  Catering manager Violence: Not At Risk (08/13/2023)   Humiliation, Afraid, Rape, and Kick questionnaire    Fear of Current or Ex-Partner: No    Emotionally Abused: No    Physically Abused: No    Sexually Abused: No    Family History  Problem Relation Age of Onset   Cancer Father        lung   Diabetes Mother    Breast cancer Maternal Aunt 60   Breast cancer Maternal Aunt 76   Allergies  Allergen Reactions   Amoxicillin Hives   Hydrocodone Itching   Sulfamethoxazole-Trimethoprim Nausea Only   Allopurinol Rash   I? Current Outpatient Medications  Medication Sig Dispense Refill   acetaminophen (TYLENOL) 650 MG CR tablet Take 1 tablet (650 mg total) by mouth every 8 (eight) hours as needed for pain.      ascorbic acid (VITAMIN C) 500 MG tablet Take 1 tablet (500 mg total) by mouth 2 (two) times daily.     aspirin EC 81 MG tablet Take 81 mg by mouth daily.     calcium-vitamin D (OSCAL WITH D) 500-200 MG-UNIT TABS tablet Take 1 tablet by mouth daily.     Carboxymethylcellulose Sodium (REFRESH CELLUVISC OP) Apply 1 drop to eye every 4 (four) hours as needed.     fluticasone (FLONASE) 50 MCG/ACT nasal spray Place into the nose.     gabapentin (NEURONTIN) 800 MG tablet Take 1 tablet (800 mg total) by mouth 3 (three) times daily. Home med. 270 tablet 1   Multiple Vitamin (MULTIVITAMIN WITH MINERALS) TABS tablet Take 1 tablet by mouth daily.     omeprazole (PRILOSEC) 20 MG capsule Take 1 capsule by mouth once daily 90 capsule  3   OXcarbazepine (TRILEPTAL) 150 MG tablet Take 300 mg by mouth 2 (two) times daily.     vitamin B-12 (CYANOCOBALAMIN) 250 MCG tablet Take 250 mcg by mouth daily.     zinc sulfate, 50mg  elemental zinc, 220 (50 Zn) MG capsule Take 1 capsule (220 mg total) by mouth daily.     No current facility-administered medications for this visit.     Abtx:  Anti-infectives (From admission, onward)    None       REVIEW OF SYSTEMS:  Const: negative fever, negative chills, negative weight loss Eyes: negative diplopia or visual changes, negative eye pain ENT: negative coryza, negative sore throat Resp: negative cough, hemoptysis, dyspnea Cards: negative for chest pain, palpitations, lower extremity edema GU: negative for frequency, dysuria and hematuria GI: Negative for abdominal pain, diarrhea, bleeding, constipation Skin: negative for rash and pruritus Heme: negative for easy bruising and gum/nose bleeding MS: as above Neurolo:headaches Psych: negative for feelings of anxiety, depression  Endocrine: negative for thyroid, diabetes Allergy/Immunology- Sulfa not an allergy- just nausea  Objective:  VITALS:  BP 98/64   Pulse 96   Temp (!) 97.4 F (36.3 C) (Temporal)    PHYSICAL EXAM:  General: Alert, cooperative, no distress, appears stated age.  Head: Normocephalic, without obvious abnormality, atraumatic. Eyes: Conjunctivae clear, anicteric sclerae. Pupils are equal ENT Nares normal. No drainage or sinus tenderness. Lips, mucosa, and tongue normal. No Thrush Neck: Supple, symmetrical, no adenopathy, thyroid: non tender no carotid bruit and no JVD. Back: No CVA tenderness. Lungs: Clear to auscultation bilaterally. No Wheezing or Rhonchi. No rales. Heart: Regular rate and rhythm, no murmur, rub or gallop. Abdomen: Soft, non-tender,not distended. Bowel sounds normal. No masses Extremities: b/l benous edema Left > rt Left foot near the 3/4 th toe- ulcerated wound on the dorsum On the leg on the medial aspect there are a few erythematous spots      Skin: No rashes or lesions. Or bruising Lymph: Cervical, supraclavicular normal. Neurologic: Grossly non-focal Pertinent Labs Lab Results CBC    Component Value Date/Time   WBC 7.3 08/17/2023 0633   RBC 4.05 08/17/2023 0633   HGB 10.8 (L) 08/17/2023 0633   HGB 11.0 (L) 09/26/2016 1507   HCT 33.4 (L) 08/17/2023 0633   HCT 34.1 09/26/2016 1507   PLT 199 08/17/2023 0633   PLT 267 09/26/2016 1507   MCV 82.5 08/17/2023 0633   MCV 85 09/26/2016 1507   MCH 26.7 08/17/2023 0633   MCHC 32.3 08/17/2023 0633   RDW 14.0 08/17/2023 0633   RDW 15.2 09/26/2016 1507   LYMPHSABS 7.4 (H) 08/13/2023 1200   LYMPHSABS 6.2 (H) 09/26/2016 1507   MONOABS 0.7 08/13/2023 1200   EOSABS 0.2 08/13/2023 1200   EOSABS 0.1 09/26/2016 1507   BASOSABS 0.1 08/13/2023 1200   BASOSABS 0.0 09/26/2016 1507       Latest Ref Rng & Units 08/17/2023    6:33 AM 08/15/2023    3:25 AM 08/14/2023    6:25 AM  CMP  Glucose 70 - 99 mg/dL 161  096  98   BUN 8 - 23 mg/dL 20  20  21    Creatinine 0.44 - 1.00 mg/dL 0.45  4.09  8.11   Sodium 135 - 145 mmol/L 134  136  137   Potassium 3.5 - 5.1 mmol/L 4.2  4.2  4.3   Chloride 98 -  111 mmol/L 101  105  105   CO2 22 - 32 mmol/L 25  24  23   Calcium 8.9 - 10.3 mg/dL 8.6  8.5  8.6   Total Protein 6.5 - 8.1 g/dL   5.7   Total Bilirubin 0.0 - 1.2 mg/dL   0.6   Alkaline Phos 38 - 126 U/L   69   AST 15 - 41 U/L   15   ALT 0 - 44 U/L   11       Microbiology: 08/14/23 Microbiology of wound mycobacterium chelonae Nocardia reported   IMAGING RESULTS: MRI of left foot- 08/13/23  Mild subcutaneous fat edema and swelling throughout the dorsolateral midfoot, greatest at the level of the metatarsal shafts. No walled-off fluid collection to indicate an abscess. 2. No evidence of acute osteomyelitis.   I have personally reviewed the films ? 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 Cultures confirm Mycobacterium chelonae and there was mention of Nocardia as well . Previous antibiotics, ciprofloxacin and doxycycline, partial improvement- need to discuss with [facility] regarding final report and susceptibility.  MRI from March shows no bone involvement. Treatment requires a 3-4 month antibiotic regimen. Potential nausea from antibiotics can be managed with anti-nausea medication. If bone involvement occurs, intravenous antibiotics and extended treatment may be necessary. Initiate doxycycline, sulfa, and azithromycin. Until susceptibility is back.  Coordinate with [facility] for final culture results Repeat MRI if no response to assess bone involvement. Educated on avoiding pet contact with the foot and ensure the foot is coveredn. . Advise on spacing antibiotics and vitamins   Chronic lymphocytic leukemia (CLL)   Stage 1 CLL is under observation with no active treatment. Regular blood tests monitor the condition. She exhibits no symptoms such as splenomegaly, fever, night sweats, or weight loss. Low immunoglobulin  levels may contribute to prolonged infections. Contacted oncologist  and today will check immunoglobulin levels and consider immunoglobulin infusion if IgG is low.  Postherpetic neuralgia  /trigeminal neuralgia In feb had radifrequecy ablation of rt trigeminal nerve- on oxcarbazepine.(Trileptal) and gabapentin  Persistent pain post-shingles infection in 2017 affects the right side of the head and neck. Continue management with gabapentin and   Breast cancer (right side, in situ)   Right-sided breast cancer was treated with surgery and radiation in 2018. No chemotherapy was administered. ? ?  She has  a follow up in 3-4 weeks Will contact her as soon as I get the final result from [facility]  ________________________________________________

## 2023-09-03 LAB — IGG, IGA, IGM
IgA: <5 mg/dL — ABNORMAL LOW (ref 64–422)
IgG (Immunoglobin G), Serum: 612 mg/dL (ref 586–1602)
IgM (Immunoglobulin M), Srm: <5 mg/dL — ABNORMAL LOW (ref 26–217)

## 2023-09-07 LAB — NOCARDIA ID BY MALDI

## 2023-09-15 LAB — FUNGUS CULTURE WITH STAIN

## 2023-09-15 LAB — FUNGAL ORGANISM REFLEX

## 2023-09-15 LAB — FUNGUS CULTURE RESULT

## 2023-09-21 LAB — ORGANISM ID BY MALDI

## 2023-09-21 LAB — MISC LABCORP TEST (SEND OUT)
LabCorp test name: 183166
Labcorp test code: 183166

## 2023-09-21 LAB — MYCOBACTERIA ID BY MALDI

## 2023-09-22 LAB — AEROBIC/ANAEROBIC CULTURE W GRAM STAIN (SURGICAL/DEEP WOUND)

## 2023-09-23 LAB — RAPID GROWER BROTH SUSCEP.
Amikacin: 8
Ciprofloxacin: 4
Clofazimine: 0.12
Doxycycline: >8
Linezolid: 8
Tigecycline: 0.06
Tobramycin: 2

## 2023-09-23 LAB — ORG ID BY SEQUENCING RFLX AST

## 2023-09-23 LAB — ACID FAST CULTURE WITH REFLEXED SENSITIVITIES (MYCOBACTERIA): Acid Fast Culture: POSITIVE — AB

## 2023-09-24 ENCOUNTER — Encounter: Payer: Self-pay | Admitting: Infectious Diseases

## 2023-09-24 ENCOUNTER — Other Ambulatory Visit
Admission: RE | Admit: 2023-09-24 | Discharge: 2023-09-24 | Disposition: A | Discharge status: Home / Self Care | Source: Ambulatory Visit | Attending: Infectious Diseases | Admitting: Infectious Diseases

## 2023-09-24 ENCOUNTER — Ambulatory Visit: Attending: Infectious Diseases | Admitting: Infectious Diseases

## 2023-09-24 ENCOUNTER — Telehealth: Payer: Self-pay

## 2023-09-24 VITALS — BP 116/76 | HR 90 | Temp 97.6°F

## 2023-09-24 DIAGNOSIS — Z86000 Personal history of in-situ neoplasm of breast: Secondary | ICD-10-CM | POA: Diagnosis not present

## 2023-09-24 DIAGNOSIS — B0222 Postherpetic trigeminal neuralgia: Secondary | ICD-10-CM | POA: Insufficient documentation

## 2023-09-24 DIAGNOSIS — A311 Cutaneous mycobacterial infection: Secondary | ICD-10-CM

## 2023-09-24 DIAGNOSIS — D0511 Intraductal carcinoma in situ of right breast: Secondary | ICD-10-CM | POA: Diagnosis present

## 2023-09-24 DIAGNOSIS — R1084 Generalized abdominal pain: Secondary | ICD-10-CM | POA: Diagnosis present

## 2023-09-24 DIAGNOSIS — L089 Local infection of the skin and subcutaneous tissue, unspecified: Secondary | ICD-10-CM | POA: Diagnosis not present

## 2023-09-24 DIAGNOSIS — C9110 Chronic lymphocytic leukemia of B-cell type not having achieved remission: Secondary | ICD-10-CM | POA: Insufficient documentation

## 2023-09-24 LAB — CBC WITH DIFFERENTIAL/PLATELET
Abs Immature Granulocytes: 0.2 10*3/uL — ABNORMAL HIGH (ref 0.00–0.07)
Basophils Absolute: 0.1 10*3/uL (ref 0.0–0.1)
Basophils Relative: 0 %
Eosinophils Absolute: 0 10*3/uL (ref 0.0–0.5)
Eosinophils Relative: 0 %
HCT: 31.9 % — ABNORMAL LOW (ref 36.0–46.0)
Hemoglobin: 10.5 g/dL — ABNORMAL LOW (ref 12.0–15.0)
Immature Granulocytes: 2 %
Lymphocytes Relative: 26 %
Lymphs Abs: 3.5 10*3/uL (ref 0.7–4.0)
MCH: 25.9 pg — ABNORMAL LOW (ref 26.0–34.0)
MCHC: 32.9 g/dL (ref 30.0–36.0)
MCV: 78.8 fL — ABNORMAL LOW (ref 80.0–100.0)
Monocytes Absolute: 1.1 10*3/uL — ABNORMAL HIGH (ref 0.1–1.0)
Monocytes Relative: 8 %
Neutro Abs: 8.6 10*3/uL — ABNORMAL HIGH (ref 1.7–7.7)
Neutrophils Relative %: 64 %
Platelets: 561 10*3/uL — ABNORMAL HIGH (ref 150–400)
RBC: 4.05 MIL/uL (ref 3.87–5.11)
RDW: 14.9 % (ref 11.5–15.5)
WBC: 13.4 10*3/uL — ABNORMAL HIGH (ref 4.0–10.5)
nRBC: 0 % (ref 0.0–0.2)

## 2023-09-24 LAB — COMPREHENSIVE METABOLIC PANEL WITH GFR
ALT: 21 U/L (ref 0–44)
AST: 17 U/L (ref 15–41)
Albumin: 3.6 g/dL (ref 3.5–5.0)
Alkaline Phosphatase: 83 U/L (ref 38–126)
Anion gap: 12 (ref 5–15)
BUN: 31 mg/dL — ABNORMAL HIGH (ref 8–23)
CO2: 22 mmol/L (ref 22–32)
Calcium: 9.4 mg/dL (ref 8.9–10.3)
Chloride: 95 mmol/L — ABNORMAL LOW (ref 98–111)
Creatinine, Ser: 1.01 mg/dL — ABNORMAL HIGH (ref 0.44–1.00)
GFR, Estimated: 59 mL/min — ABNORMAL LOW (ref >60)
Glucose, Bld: 152 mg/dL — ABNORMAL HIGH (ref 70–99)
Potassium: 4.1 mmol/L (ref 3.5–5.1)
Sodium: 129 mmol/L — ABNORMAL LOW (ref 135–145)
Total Bilirubin: 0.5 mg/dL (ref 0.0–1.2)
Total Protein: 6.7 g/dL (ref 6.5–8.1)

## 2023-09-24 LAB — AMYLASE: Amylase: 175 U/L — ABNORMAL HIGH (ref 28–100)

## 2023-09-24 LAB — LIPASE, BLOOD: Lipase: 97 U/L — ABNORMAL HIGH (ref 11–51)

## 2023-09-24 MED ORDER — DOXYCYCLINE MONOHYDRATE 100 MG PO TABS: 100.0000 mg | ORAL_TABLET | Freq: Two times a day (BID) | ORAL | 0 refills | Status: DC

## 2023-09-24 NOTE — Telephone Encounter (Signed)
 Patient's husband advised of lab results. Patient will reach out to Dr. Jonette Nestle office regarding the Torsemide and her sodium. Patient also reports no vomiting since returning home today.  Per Dr. Francee Inch patient can take Zofran  today and tomorrow while she has the Azithromycin  on hold. After restarting the Azithromycin  she will need to stop the Zofran .  Kema Santaella Adel Holt, CMA

## 2023-09-24 NOTE — Progress Notes (Signed)
 NAME: Joanna Phillips  DOB: 04-12-1951  MRN: [id number]  Date/Time: 09/24/2023 9:20 AM   Subjective:  Follow up visit here with her husband First seen on 09/01/23   Joanna Phillips is a 73 year old female with chronic lymphocytic leukemia, rt ca breast DCIS s/p lumpectomy   She was referred by Dr. Althea Atkinson for  management of her Mycobacterium chelonae infection on 09/01/23. She was started on Azithromycin  500mg  Once a day, Bactrim  DS 1 BID and doxy was continued  She now  presents  with persistent stomach pain and nausea.  She has been experiencing persistent abdominal pain since starting treatment for Mycobacterium chelonae infection. The pain is continuous and began with the initiation of antibiotics, including azithromycin , doxycycline , and Bactrim . She also experiences nausea and vomiting since the previous night, and her husband notes she has been unable to eat properly.  She was  hospitalized at [facility] on 09/02/23 for shaking and fever, where multiple tests were conducted, all returning negative results. She received IV zosyn and vanco  and was discharged after her fever subsided. The fever was initially suspected to be related to her foot infection, but no definitive cause was identified. The attending physciian had spoken to me then- I was concerned about bactrim  reaction but he said the fever resolved and was not   Her current antibiotic regimen includes azithromycin , doxycycline , and Bactrim , started on 4/15- The susceptibility of m.chelonae resulted yesterday 5/7 and it is resistant to Doxycycline  , but sensitive to azithromycin  and bactrim .  She takes azithromycin  twice daily instead of once a day  which may be contributing to her symptoms.  She is experiencing  weakness and fatigue, requiring the use of a wheelchair for mobility. Previously, she was able to walk with a walker but has become more dependent on assistance due to deconditioning from a prolonged hospital stay.  She reports a recent  onset of worsening hearing ( baseline had some hearing loss rt ear due to a bad case of shingles and vision issues, with decreased hearing on the right side and a sudden change in vision. She went to [facility] and has been prescribed prescribed difluprednate and atropine eye drops- She is being referred to [facility] ophthalmology for further work up  Her current medications include azithromycin , Bactrim , Trileptal  for trigeminal neuralgia, gabapentin , torsemide for edema, and eye drops for an eye infection. She has stopped taking aspirin  since her recent hospitalization.  No fever, cough, or cold symptoms. She experiences fatigue and weakness, and has noted recent changes in hearing and vision. She reports loose bowel movements once daily for the past week.   Following taken from last visit  The left foot infection began before February 2025 with a small bump that progressively enlarged. Surgical intervention was performed on August 11, 2023, to excise the infection. Post-surgery, she received home health care for wound dressing three times a week. . She completed a course of Ciprofloxacin  and Doxycycline , which initially seemed to help, but new lesions  appeared. 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 She takes gabapentin  800 mg three times a day and oxcarbazepine  three times a day for pain management. She also takes omeprazole  20 mg daily.  Her past medical history includes chronic lymphocytic leukemia diagnosed in 2011 or 2012, currently under observation  without active treatment. She also had right-sided breast cancer in 2018, treated with surgery and radiation, but no chemotherapy. No history of diabetes, hypertension, or heart issues.   She has not engaged in yard work or been scratched by pets. She had a  pedicure earlier this year, performed by her licensed niece, which involved soaking her feet in a whirlpool.  Past Medical History:  Diagnosis Date   Arthritis    Breast cancer (HCC) 2017   DCIS   CLL (chronic lymphocytic leukemia) (HCC) 2011   DCIS (ductal carcinoma in situ) 03/11/2016   GERD (gastroesophageal reflux disease)    Hyponatremia    Personal history of radiation therapy 2017    Past Surgical History:  Procedure Laterality Date   BREAST BIOPSY Right 02/12/2016   DCIS   BREAST BIOPSY Right 12/27/2019   path pending, calcs, x marker   BREAST LUMPECTOMY Right 03/11/2016   DCIS neg margins   CHOLECYSTECTOMY  1996   COLONOSCOPY  2013   COLONOSCOPY WITH PROPOFOL  N/A 04/19/2021   Procedure: COLONOSCOPY WITH PROPOFOL ;  Surgeon: Quintin Buckle, DO;  Location: [facility];  Service: Gastroenterology;  Laterality: N/A;   IRRIGATION AND DEBRIDEMENT FOOT Left 08/14/2023   Procedure: IRRIGATION AND DEBRIDEMENT FOOT;  Surgeon: Anell Baptist, DPM;  Location: [facility];  Service: Orthopedics/Podiatry;  Laterality: Left;   squamous cell skin on scalp surgery Right     Social History   Socioeconomic History   Marital status: Married    Spouse name: Not on file   Number of children: Not on file   Years of education: Not on file   Highest education level: Not on file  Occupational History   Occupation: retired   Tobacco Use   Smoking status: Never   Smokeless tobacco: Never  Vaping Use   Vaping status: Never Used  Substance and Sexual Activity   Alcohol  use: No   Drug use: No   Sexual activity: Not on file  Other Topics Concern   Not on file  Social History Narrative   Not on file   Social Drivers of Health   Financial Resource Strain: Low Risk  (07/23/2023)   Received from [facility]   Overall Financial Resource Strain (CARDIA)    Difficulty of Paying Living Expenses: Not hard at all  Food Insecurity: No Food Insecurity (08/13/2023)   Hunger  Vital Sign    Worried About Running Out of Food in the Last Year: Never true    Ran Out of Food in the Last Year: Never true  Transportation Needs: No Transportation Needs (08/13/2023)   PRAPARE - Administrator, Civil Service (Medical): No    Lack of Transportation (Non-Medical): No  Physical Activity: Insufficiently Active (01/11/2019)   Exercise Vital Sign    Days of Exercise per Week: 3 days    Minutes of Exercise per Session: 30 min  Stress: No Stress Concern Present (01/11/2019)   [employer] of Occupational Health - Occupational Stress Questionnaire    Feeling of Stress : Not at all  Social Connections: Moderately Integrated (08/13/2023)   Social Connection and Isolation Panel [NHANES]    Frequency of Communication with Friends and Family: More than three times a week    Frequency of Social Gatherings with Friends and Family: More than three times a week    Attends Religious Services: More than 4 times per year    Active Member of [employer] or Organizations: No    Attends Club or  Organization Meetings: Never    Marital Status: Married  Catering manager Violence: Not At Risk (08/13/2023)   Humiliation, Afraid, Rape, and Kick questionnaire    Fear of Current or Ex-Partner: No    Emotionally Abused: No    Physically Abused: No    Sexually Abused: No    Family History  Problem Relation Age of Onset   Cancer Father        lung   Diabetes Mother    Breast cancer Maternal Aunt 60   Breast cancer Maternal Aunt 11   Allergies  Allergen Reactions   Amoxicillin Hives   Hydrocodone Itching   Sulfamethoxazole -Trimethoprim  Nausea Only   Allopurinol  Rash   I? Current Outpatient Medications  Medication Sig Dispense Refill   acetaminophen  (TYLENOL ) 650 MG CR tablet Take 1 tablet (650 mg total) by mouth every 8 (eight) hours as needed for pain.     aspirin  EC 81 MG tablet Take 81 mg by mouth daily.     atropine 1 % ophthalmic solution Place 1 drop into both eyes 2 (two)  times daily     azithromycin  (ZITHROMAX ) 500 MG tablet Take 1 tablet (500 mg total) by mouth daily. 30 tablet 0   calcium -vitamin D (OSCAL WITH D) 500-200 MG-UNIT TABS tablet Take 1 tablet by mouth daily.     Carboxymethylcellulose Sodium (REFRESH CELLUVISC OP) Apply 1 drop to eye every 4 (four) hours as needed.     Difluprednate 0.05 % EMUL Place 1 drop into both eyes 6 (six) times daily     doxycycline  (ADOXA) 100 MG tablet Take 1 tablet (100 mg total) by mouth 2 (two) times daily. 60 tablet 0   fluticasone  (FLONASE ) 50 MCG/ACT nasal spray Place into the nose.     gabapentin  (NEURONTIN ) 800 MG tablet Take 1 tablet (800 mg total) by mouth 3 (three) times daily. Home med. 270 tablet 1   Multiple Vitamin (MULTIVITAMIN WITH MINERALS) TABS tablet Take 1 tablet by mouth daily.     omeprazole  (PRILOSEC) 20 MG capsule Take 1 capsule by mouth once daily 90 capsule 3   OXcarbazepine  (TRILEPTAL ) 150 MG tablet Take 300 mg by mouth 2 (two) times daily.     potassium chloride (KLOR-CON) 10 MEQ tablet Take 10 mEq by mouth daily.     sulfamethoxazole -trimethoprim  (BACTRIM  DS) 800-160 MG tablet Take 1 tablet by mouth 2 (two) times daily. 60 tablet 0   torsemide (DEMADEX) 20 MG tablet Take 20 mg by mouth daily.     vitamin B-12 (CYANOCOBALAMIN ) 250 MCG tablet Take 250 mcg by mouth daily.     No current facility-administered medications for this visit.     Abtx:  Anti-infectives (From admission, onward)    None       REVIEW OF SYSTEMS:  Const: negative fever, negative chills, negative weight loss Eyes: negative diplopia or visual changes, negative eye pain ENT: negative coryza, negative sore throat Resp: negative cough, hemoptysis, dyspnea Cards: negative for chest pain, palpitations, lower extremity edema GU: negative for frequency, dysuria and hematuria GI:  abdominal pain, nausea vomiting Skin: negative for rash and pruritus Heme: negative for easy bruising and gum/nose bleeding MS: as  above Neurolo:headaches Psych: negative for feelings of anxiety, depression  Endocrine: negative for thyroid, diabetes Allergy/Immunology- Sulfa  not an allergy- just nausea  Objective:  VITALS:  BP 116/76   Pulse 90   Temp 97.6 F (36.4 C) (Temporal)   SpO2 95%   PHYSICAL EXAM:  General: Alert, cooperative, no distress, appears  stated age.  Head: Normocephalic, without obvious abnormality, atraumatic. Eyes: Conjunctivae clear, anicteric sclerae. Pupils are equal ENT Nares normal. No drainage or sinus tenderness. Lips, mucosa, normal. No Thrush Tongue wasting on the rt side - shifted to the rt on protrusion  Neck: Supple, symmetrical, no adenopathy, thyroid: non tender no carotid bruit and no JVD.  Lungs: Clear to auscultation bilaterally. No Wheezing or Rhonchi. No rales. Heart: Regular rate and rhythm, no murmur, rub or gallop. Abdomen: Soft,minimal tenderness - Bowel sounds N Extremities: b/l benous edema Left > rt Left foot near the 3/4 th toe- ulcerated wound on the dorsum, much better    4/15     Skin: No rashes or lesions. Or bruising Lymph: Cervical, supraclavicular normal. Neurologic: tongue paralysis rt side   Microbiology: 08/14/23 Microbiology of wound mycobacterium chelonae Confirmed Nocardia not there and it has been corrected     IMAGING RESULTS: MRI of left foot- 08/13/23  Mild subcutaneous fat edema and swelling throughout the dorsolateral midfoot, greatest at the level of the metatarsal shafts. No walled-off fluid collection to indicate an abscess. 2. No evidence of acute osteomyelitis.   I have personally reviewed the films ? Impression/Recommendation ?Mycobacterium chelonae foot infection  - No nocardia- [facility] confirmed it  left foot since feb 2025  presents with new lesions and swelling following surgical debridement on August 11, 2023. MRI from March shows no bone involvemen Pt currently on doxy, azithromycin  and bactrim ( this  was given because Nocardia was reported previously) Susceptibility was released yesterday-  Resistant to Doxy   Treatment requires a 3-4 month antibiotic regimen.   She currently has severe GI symptoms ( abdominal pain, poor appetitie, nausea and vomiting)  and she has inadvertently been taking double the dose of azithromycin  Will stop all antibiotics for a few days to see whether her symptoms resolve If so then we can introduce azithroycin first Doxy will be discontinued as resistant Bactrim  is susceptible but not a first line drug-not sure whether it is causing her GI symptoms Quinolone resistant Linezolid susceptible Tigecycline susceptible  We will need to select the 2nd antibiotic which could be linezolid or omadacycline or tigecycline  Recent unexplained admission with shaking and fever to [facility] This has resolved  Vision impairment- followed at [facility]- concern for infection Will call them and get records  Chronic lymphocytic leukemia (CLL)   Stage 1 CLL is under observation with no active treatment. Regular blood tests monitor the condition. She exhibits no symptoms such as splenomegaly, fever, night sweats, or weight loss. Low immunoglobulin levels may contribute to prolonged infections. Contacted oncologist   immunoglobulin levels checked- low IgA and M- G is normal   Postherpetic neuralgia  /trigeminal neuralgia In feb had radifrequecy ablation of rt trigeminal nerve- on oxcarbazepine .(Trileptal ) and gabapentin   Persistent pain post-shingles infection in 2017 affects the right side of the head and neck. Continue management with gabapentin  and   Breast cancer (right side, in situ)   Right-sided breast cancer was treated with surgery and radiation in 2018. No chemotherapy was administered. ? ? Follow up next week Labs will be sent today- CBC/CMP/lipase amylase Told patient and her hsuband to go to the ED if vomiting persist     ________________________________________________

## 2023-09-24 NOTE — Patient Instructions (Signed)
 During today's visit, we discussed your ongoing Mycobacterium chelonae infection and the associated symptoms you have been experiencing, including persistent stomach pain, nausea, and vomiting. We also reviewed your recent hearing and vision changes, as well as your overall health and mobility challenges. You have been taking azithromycin  500mg  Twice a day instead of once a day and other 2 antibiotics were doxy 100mg  Twice a day and Sulfa /trimeth  twice a day ( correctly)   YOUR PLAN:  -MYCOBACTERIUM CHELONAE INFECTION: Mycobacterium chelonae is a type of bacterial infection that affects the skin and can be difficult to treat. We have decided to stop all your current antibiotics to reset your system. Starting on May 10 or 11, you will begin taking azithromycin  500 mg once daily. We will monitor your gastrointestinal symptoms before reintroducing Bactrim . If Bactrim  continues to cause side effects, we may consider alternative antibiotics. Blood tests will be ordered to check your liver function and other parameters, and we may also consider imaging of your foot to ensure there is no bone involvement.  -NAUSEA AND VOMITING: Your nausea and vomiting are likely related to your antibiotic regimen, particularly Bactrim . We will stop all antibiotics to see if your symptoms improve. It is important to ensure you are eating adequately before restarting any antibiotics.   -HEARING LOSS: Your recent worsening of hearing, particularly in the right ear, may be related to antibiotic side effects or previous shingles. We will continue with your ENT follow-up and scheduled audiology testing.  -TRIGEMINAL NEURALGIA: Trigeminal neuralgia is a chronic pain condition affecting the trigeminal nerve in the face. It is currently managed with oxcarbazepine  and gabapentin .  -CHRONIC VENOUS INSUFFICIENCY WITH EDEMA: Chronic venous insufficiency is a condition where the veins have trouble sending blood from the legs back to the  heart, causing swelling. This has been worsened by your prolonged immobility. We encourage you to use compression stockings when possible and to elevate your legs to reduce swelling. Continue with physical therapy to improve your mobility.  -LACK OF COORDINATION: Your recent lack of coordination is likely due to deconditioning from your prolonged hospitalization and immobility. Continue with physical and occupational therapy to improve your coordination and strength.  INSTRUCTIONS:  Please follow up in one week and bring all your medications to the appointment. We will contact you on Tuesday to assess your response to the changes in your antibiotic regimen. Ensure you continue with your scheduled follow-ups with the ENT and eye specialists.

## 2023-09-24 NOTE — Telephone Encounter (Signed)
-----   Message from Alica Inks sent at 09/24/2023  2:01 PM EDT ----- Hi, let her know that her labs indicate dehydration- also sodium low because of the toresemide- if the vomingin persist she has to come to the hospital- Not sure why she can't take zofran - I dont see any cardiology note ----- Message ----- From: Dannis Dy, Lab In [place] Sent: 09/24/2023  10:54 AM EDT To: Alica Inks, MD

## 2023-09-25 ENCOUNTER — Other Ambulatory Visit: Payer: Self-pay | Admitting: Infectious Diseases

## 2023-09-25 NOTE — Progress Notes (Signed)
 Follow up after her appt yesterday to see how she was doing  in relation to nausea, vomiting, abdominal pain.she has  Pt has no nausea, no vomiting after taking zofran - she took chicken broth, yogurt , protein drink  she has intermittent abdominal cramping  She saw her cardiologist this morning and after that appt went to the GI office - they asked her to go to the walk in clinic  Vitals in his office 128/62, pulse 82, 96% Yesterday her labs showed borderline elevation of amylase and lipase  Told her not to restart antibiotics ( azithromycin , bactrim ) If the  pain persist thy will go to [place] walk in tomorrow.

## 2023-10-01 ENCOUNTER — Encounter: Payer: Self-pay | Admitting: Infectious Diseases

## 2023-10-01 ENCOUNTER — Inpatient Hospital Stay: Payer: Medicare Other | Attending: Oncology

## 2023-10-01 ENCOUNTER — Other Ambulatory Visit
Admission: RE | Admit: 2023-10-01 | Discharge: 2023-10-01 | Disposition: A | Discharge status: Home / Self Care | Source: Ambulatory Visit | Attending: Infectious Diseases | Admitting: Infectious Diseases

## 2023-10-01 ENCOUNTER — Ambulatory Visit: Attending: Infectious Diseases | Admitting: Infectious Diseases

## 2023-10-01 VITALS — BP 98/62 | HR 85 | Temp 97.7°F

## 2023-10-01 DIAGNOSIS — A318 Other mycobacterial infections: Secondary | ICD-10-CM

## 2023-10-01 DIAGNOSIS — B9689 Other specified bacterial agents as the cause of diseases classified elsewhere: Secondary | ICD-10-CM | POA: Diagnosis not present

## 2023-10-01 DIAGNOSIS — C9110 Chronic lymphocytic leukemia of B-cell type not having achieved remission: Secondary | ICD-10-CM | POA: Insufficient documentation

## 2023-10-01 DIAGNOSIS — H539 Unspecified visual disturbance: Secondary | ICD-10-CM

## 2023-10-01 DIAGNOSIS — G500 Trigeminal neuralgia: Secondary | ICD-10-CM | POA: Insufficient documentation

## 2023-10-01 DIAGNOSIS — Z853 Personal history of malignant neoplasm of breast: Secondary | ICD-10-CM | POA: Insufficient documentation

## 2023-10-01 DIAGNOSIS — B0229 Other postherpetic nervous system involvement: Secondary | ICD-10-CM | POA: Diagnosis not present

## 2023-10-01 DIAGNOSIS — L089 Local infection of the skin and subcutaneous tissue, unspecified: Secondary | ICD-10-CM

## 2023-10-01 DIAGNOSIS — L0889 Other specified local infections of the skin and subcutaneous tissue: Secondary | ICD-10-CM | POA: Diagnosis not present

## 2023-10-01 LAB — CBC WITH DIFFERENTIAL/PLATELET
Abs Immature Granulocytes: 0.06 10*3/uL (ref 0.00–0.07)
Basophils Absolute: 0.1 10*3/uL (ref 0.0–0.1)
Basophils Relative: 1 %
Eosinophils Absolute: 0.2 10*3/uL (ref 0.0–0.5)
Eosinophils Relative: 2 %
HCT: 31.6 % — ABNORMAL LOW (ref 36.0–46.0)
Hemoglobin: 10.1 g/dL — ABNORMAL LOW (ref 12.0–15.0)
Immature Granulocytes: 1 %
Lymphocytes Relative: 47 %
Lymphs Abs: 4.5 10*3/uL — ABNORMAL HIGH (ref 0.7–4.0)
MCH: 26 pg (ref 26.0–34.0)
MCHC: 32 g/dL (ref 30.0–36.0)
MCV: 81.4 fL (ref 80.0–100.0)
Monocytes Absolute: 0.7 10*3/uL (ref 0.1–1.0)
Monocytes Relative: 8 %
Neutro Abs: 3.8 10*3/uL (ref 1.7–7.7)
Neutrophils Relative %: 41 %
Platelets: 397 10*3/uL (ref 150–400)
RBC: 3.88 MIL/uL (ref 3.87–5.11)
RDW: 15.7 % — ABNORMAL HIGH (ref 11.5–15.5)
WBC: 9.2 10*3/uL (ref 4.0–10.5)
nRBC: 0 % (ref 0.0–0.2)

## 2023-10-01 LAB — COMPREHENSIVE METABOLIC PANEL WITH GFR
ALT: 38 U/L (ref 0–44)
AST: 46 U/L — ABNORMAL HIGH (ref 15–41)
Albumin: 3.5 g/dL (ref 3.5–5.0)
Alkaline Phosphatase: 89 U/L (ref 38–126)
Anion gap: 10 (ref 5–15)
BUN: 16 mg/dL (ref 8–23)
CO2: 26 mmol/L (ref 22–32)
Calcium: 9 mg/dL (ref 8.9–10.3)
Chloride: 102 mmol/L (ref 98–111)
Creatinine, Ser: 0.53 mg/dL (ref 0.44–1.00)
GFR, Estimated: >60 mL/min (ref >60)
Glucose, Bld: 103 mg/dL — ABNORMAL HIGH (ref 70–99)
Potassium: 3.9 mmol/L (ref 3.5–5.1)
Sodium: 138 mmol/L (ref 135–145)
Total Bilirubin: 0.4 mg/dL (ref 0.0–1.2)
Total Protein: 6.4 g/dL — ABNORMAL LOW (ref 6.5–8.1)

## 2023-10-01 LAB — SEDIMENTATION RATE: Sed Rate: 43 mm/h — ABNORMAL HIGH (ref 0–30)

## 2023-10-01 LAB — C-REACTIVE PROTEIN: CRP: 1.5 mg/dL — ABNORMAL HIGH (ref <1.0)

## 2023-10-01 MED ORDER — LINEZOLID 600 MG PO TABS: 600.0000 mg | ORAL_TABLET | Freq: Every day | ORAL | 0 refills | Status: DC

## 2023-10-01 NOTE — Progress Notes (Signed)
 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 She was having severe GI symptoms nausea, vomiting and abdominal apin and diarrhea She was asked to stop bactrim , azithromycin  and doxy She is doing so much better Symptoms almost resolved- able to eat now No diarrhea   She was referred by Dr. Althea Atkinson for  management of her Mycobacterium chelonae infection on 09/01/23. She was started on Azithromycin  500mg  Once a day, Bactrim  DS 1 BID and doxy was continued She was  hospitalized at [facility] on 09/02/23 for shaking and fever, where multiple tests were conducted, all returning negative results. She received IV zosyn and vanco  and was discharged after her fever subsided. The fever was initially suspected to be related to her foot infection, but no definitive cause was identified. The attending physciian had spoken to me then- I was concerned about bactrim  reaction but he said the fever resolved even on bactrim  Last visit a week ago she had c/o recent onset of worsening hearing ( baseline had some hearing loss rt ear due to a bad case of shingles and vision issues, with decreased hearing on the right side and a sudden change in vision. She went to [facility] and has been prescribed prescribed difluprednate and atropine eye drops for anterior uveitis- She was  referred to [facility] ophthalmology and saw them this week and asked to continue the same drops and they saw some improvement in the eye    Medical history  The left foot infection began before February 2025 with a small bump that progressively enlarged. Surgical intervention was performed on August 11, 2023, to excise the  infection. Post-surgery, she received home health care for wound dressing three times a week. . She completed a course of Ciprofloxacin  and Doxycycline , which initially seemed to help, but new lesions  appeared. 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 She takes gabapentin  800 mg three times a day and oxcarbazepine  three times a day for pain management. She also takes omeprazole  20 mg daily.  Her past medical history includes chronic lymphocytic leukemia diagnosed in 2011 or 2012, currently under observation without active treatment. She also had right-sided breast cancer in 2018, treated with surgery and radiation, but no chemotherapy. No history of diabetes, hypertension, or heart issues.   She has not engaged in yard work or been scratched by pets. She had a pedicure earlier this year, performed by her licensed niece, which involved soaking her feet in a whirlpool.  Past Medical History:  Diagnosis Date   Arthritis    Breast cancer (HCC) 2017   DCIS   CLL (chronic lymphocytic leukemia) (HCC) 2011   DCIS (ductal carcinoma in situ) 03/11/2016   GERD (gastroesophageal reflux disease)    Hyponatremia    Personal history of radiation therapy 2017    Past Surgical History:  Procedure Laterality Date   BREAST BIOPSY Right 02/12/2016   DCIS   BREAST BIOPSY Right 12/27/2019  path pending, calcs, x marker   BREAST LUMPECTOMY Right 03/11/2016   DCIS neg margins   CHOLECYSTECTOMY  1996   COLONOSCOPY  2013   COLONOSCOPY WITH PROPOFOL  N/A 04/19/2021   Procedure: COLONOSCOPY WITH PROPOFOL ;  Surgeon: Quintin Buckle, DO;  Location: [facility] ENDOSCOPY;  Service: Gastroenterology;  Laterality: N/A;   IRRIGATION AND DEBRIDEMENT FOOT Left 08/14/2023   Procedure: IRRIGATION AND DEBRIDEMENT FOOT;  Surgeon: Anell Baptist, DPM;  Location: [facility] ORS;  Service: Orthopedics/Podiatry;  Laterality: Left;   squamous cell skin on scalp surgery Right     Social History   Socioeconomic History   Marital status: Married    Spouse name: Not on file   Number of children: Not on file   Years of education: Not on file   Highest education level: Not on file  Occupational History   Occupation: retired   Tobacco Use   Smoking status: Never   Smokeless tobacco: Never  Vaping Use   Vaping status: Never Used  Substance and Sexual Activity   Alcohol  use: No   Drug use: No   Sexual activity: Not on file  Other Topics Concern   Not on file  Social History Narrative   Not on file   Social Drivers of Health   Financial Resource Strain: Low Risk  (09/25/2023)   Received from [facility]   Overall Financial Resource Strain (CARDIA)    Difficulty of Paying Living Expenses: Not hard at all  Food Insecurity: No Food Insecurity (09/25/2023)   Received from [facility]   Hunger Vital Sign    Worried About Running Out of Food in the Last Year: Never true    Ran Out of Food in the Last Year: Never true  Transportation Needs: No Transportation Needs (09/25/2023)   Received from [facility] - Transportation    In the past 12 months, has lack of transportation kept you from medical appointments or from getting medications?: No    Lack of Transportation (Non-Medical): No  Physical Activity: Insufficiently Active (01/11/2019)   Exercise Vital Sign    Days of Exercise per Week: 3 days    Minutes of Exercise per Session: 30 min  Stress: No Stress Concern Present (01/11/2019)   [employer] of Occupational Health - Occupational Stress Questionnaire    Feeling of Stress : Not at all  Social Connections: Moderately Integrated (08/13/2023)   Social Connection and Isolation Panel [NHANES]    Frequency of Communication with Friends and Family: More than three  times a week    Frequency of Social Gatherings with Friends and Family: More than three times a week    Attends Religious Services: More than 4 times per year    Active Member of [employer] or Organizations: No    Attends Banker Meetings: Never    Marital Status: Married  Catering manager Violence: Not At Risk (08/13/2023)   Humiliation, Afraid, Rape, and Kick questionnaire    Fear of Current or Ex-Partner: No    Emotionally Abused: No    Physically Abused: No    Sexually Abused: No    Family History  Problem Relation Age of Onset   Cancer Father        lung   Diabetes Mother    Breast cancer Maternal Aunt 60   Breast cancer Maternal Aunt 76   Allergies  Allergen Reactions   Amoxicillin Hives   Hydrocodone Itching   Sulfamethoxazole -Trimethoprim  Nausea Only  Allopurinol  Rash   I? Current Outpatient Medications  Medication Sig Dispense Refill   acetaminophen  (TYLENOL ) 650 MG CR tablet Take 1 tablet (650 mg total) by mouth every 8 (eight) hours as needed for pain.     aspirin  EC 81 MG tablet Take 81 mg by mouth daily.     atropine 1 % ophthalmic solution Place 1 drop into both eyes 2 (two) times daily     azithromycin  (ZITHROMAX ) 500 MG tablet Take 1 tablet (500 mg total) by mouth daily. 30 tablet 0   calcium -vitamin D (OSCAL WITH D) 500-200 MG-UNIT TABS tablet Take 1 tablet by mouth daily.     Carboxymethylcellulose Sodium (REFRESH CELLUVISC OP) Apply 1 drop to eye every 4 (four) hours as needed.     Difluprednate 0.05 % EMUL Place 1 drop into both eyes 6 (six) times daily     doxycycline  (ADOXA) 100 MG tablet Take 1 tablet (100 mg total) by mouth 2 (two) times daily. 60 tablet 0   fluticasone  (FLONASE ) 50 MCG/ACT nasal spray Place into the nose.     gabapentin  (NEURONTIN ) 800 MG tablet Take 1 tablet (800 mg total) by mouth 3 (three) times daily. Home med. 270 tablet 1   Multiple Vitamin (MULTIVITAMIN WITH MINERALS) TABS tablet Take 1 tablet by mouth daily.      omeprazole  (PRILOSEC) 20 MG capsule Take 1 capsule by mouth once daily 90 capsule 3   OXcarbazepine  (TRILEPTAL ) 150 MG tablet Take 300 mg by mouth 2 (two) times daily.     potassium chloride (KLOR-CON) 10 MEQ tablet Take 10 mEq by mouth daily.     sulfamethoxazole -trimethoprim  (BACTRIM  DS) 800-160 MG tablet Take 1 tablet by mouth 2 (two) times daily. 60 tablet 0   torsemide (DEMADEX) 20 MG tablet Take 20 mg by mouth daily.     vitamin B-12 (CYANOCOBALAMIN ) 250 MCG tablet Take 250 mcg by mouth daily.     No current facility-administered medications for this visit.     Abtx:  Anti-infectives (From admission, onward)    None       REVIEW OF SYSTEMS:  Const: negative fever, negative chills, negative weight loss Eyes: negative diplopia or visual changes, negative eye pain ENT: negative coryza, negative sore throat Resp: negative cough, hemoptysis, dyspnea Cards: negative for chest pain, palpitations, lower extremity edema GU: negative for frequency, dysuria and hematuria GI:  minimal abdominal pain, no nausea vomiting, appetitie much better Skin: negative for rash and pruritus Heme: negative for easy bruising and gum/nose bleeding MS: as above Neurolo:headaches Psych: negative for feelings of anxiety, depression  Endocrine: negative for thyroid, diabetes Allergy/Immunology- Amoxicillin HIVEs Sulfa  nausea  Objective:  VITALS:  BP 98/62   Pulse 85   Temp 97.7 F (36.5 C) (Temporal)   SpO2 95%   PHYSICAL EXAM:  General: Alert, cooperative, no distress, appears stated age.  Head: Normocephalic, without obvious abnormality, atraumatic. Eyes: Conjunctivae clear, anicteric sclerae. Pupils are equal ENT Nares normal. No drainage or sinus tenderness. Lips, mucosa, normal. No Thrush Tongue wasting on the rt side - shifted to the rt on protrusion  Neck: Supple, symmetrical, no adenopathy, thyroid: non tender no carotid bruit and no JVD.  Lungs: Clear to auscultation  bilaterally. No Wheezing or Rhonchi. No rales. Heart: Regular rate and rhythm, no murmur, rub or gallop. Abdomen: Soft,minimal tenderness - Bowel sounds N Extremities: b/l benous edema     Left > rt Left foot near the 3/4 th toe- ulcerated wound on the dorsum, much better  4/15     Skin: No rashes or lesions. Or bruising Lymph: Cervical, supraclavicular normal. Neurologic: tongue paralysis rt side   Microbiology: 08/14/23 Microbiology of wound mycobacterium chelonae Confirmed Nocardia not there and it has been corrected     IMAGING RESULTS: MRI of left foot- 08/13/23  Mild subcutaneous fat edema and swelling throughout the dorsolateral midfoot, greatest at the level of the metatarsal shafts. No walled-off fluid collection to indicate an abscess. 2. No evidence of acute osteomyelitis.   I have personally reviewed the films ? Impression/Recommendation ?Mycobacterium chelonae f Left foot infection involving soft tissue- no bones involved  - No nocardia- [facility] confirmed it   surgical debridement on August 11, 2023. MRI from March shows no bone involvemen Pt was on doxy, azithromycin  and bactrim ( this was given because Nocardia was reported previously) Susceptibility was released on 08/24/23 and doxy resistant   Treatment requires a 3-4 month antibiotic regimen.   Because of  severe GI symptoms ( abdominal pain, poor appetitie, nausea and vomiting)  and she has inadvertently been taking double the dose of azithromycin   all antibiotics stopped on 08/25/23  and her symptoms have all resolved We will restart Azithromycin  at 500mg  once a day If she tolerates this, add linezolid 600mg  once a day in 24 hrs She may need a third antibiotic in the beginning  which would be  Tigecycline or omadacycline or clofazamine   Recent unexplained admission with shaking and fever to [facility] This has resolved  Vision impairment- followed at [facility]- anterior uveitis ( there is a concern  that it could be due to chelonae - which makes the infeciton systemic - so will check blood culture for afb  Chronic lymphocytic leukemia (CLL)   Stage 1 CLL is under observation with no active treatment. R]Followed by Dr.Rao- recent immunoglobulin level shows normal IgG but low IgM and IgA.   Postherpetic neuralgia  /trigeminal neuralgia In feb had radifrequecy ablation of rt trigeminal nerve- on oxcarbazepine .(Trileptal ) and gabapentin   Persistent pain post-shingles infection in 2017 affects the right side of the head and neck. Continue management with gabapentin  and   Breast cancer (right side, in situ)   Right-sided breast cancer was treated with surgery and radiation in 2018. No chemotherapy was administered. ? ? Follow up 2weeks  Labs will be sent today- CBC/CMP/ESR/crp/blood cultrue  Discussed in detail with patient and her husband  ________________________________________________

## 2023-10-01 NOTE — Patient Instructions (Signed)
 During today's visit, we discussed the management of your mycobacterial foot infection, chronic lymphocytic leukemia, anterior uveitis, and recent abdominal pain. We reviewed your current medications and treatment progress.  YOUR PLAN:  -MYCOBACTERIUM CHELONAE INFECTION OF THE FOOT: This is a bacterial infection in your foot that has been difficult to treat. We will restart azithromycin  and consider adding linezolid. We will also try to get approval for omadacycline if needed. We will avoid intravenous antibiotics due to the difficulty of self-administration. A blood culture will be done to ensure the infection has not spread to your bloodstream.  -CHRONIC LYMPHOCYTIC LEUKEMIA (CLL): CLL is a type of cancer that affects your blood and bone marrow, making you more susceptible to infections. Our current focus is on managing your Mycobacterium chelonae infection in the context of CLL.  -ANTERIOR UVEITIS: This is an inflammation of the front part of your eye, which may be related to your infection or medications. Your condition is improving with steroid drops and cycloplegic, and there is no inflammation in the back part of your eye.  -NAUSEA AND ABDOMINAL PAIN: Your nausea has resolved, but you still have some lower abdominal pain, likely due to previous antibiotic use. The pain has improved since stopping the antibiotics, and you are able to eat solid foods without any issues. We will continue to monitor your abdominal pain and dietary intake.  INSTRUCTIONS:  Please follow up with the blood culture to check if the infection has spread to your bloodstream. Continue taking azithromycin  500mg  one tablet once a day and if you are doing well on that start linezolid 600mg  one tablet once a day from tomorrow . Monitor your abdominal pain and dietary intake, and report any changes. Continue using the steroid drops and cycloplegic for your uveitis as directed by your ophthalmologist.

## 2023-10-02 ENCOUNTER — Encounter: Payer: Self-pay | Admitting: Infectious Diseases

## 2023-10-02 ENCOUNTER — Telehealth: Payer: Self-pay

## 2023-10-02 MED ORDER — AZITHROMYCIN 500 MG PO TABS
500.0000 mg | ORAL_TABLET | Freq: Every day | ORAL | 0 refills | Status: DC
Start: 2023-10-02 — End: 2023-10-07

## 2023-10-02 NOTE — Telephone Encounter (Signed)
 Patient husband called stating that patient needs a refill on azithromycin  to be sent to [facility]. Provider sent in 1 refill.  Patient husband also wanted to make provider aware that when patient was at [facility] they had her last name incorrect in the system. He stated that they're trying to correct this at [facility].    Chasta Deshpande Roann Chestnut, CMA

## 2023-10-06 ENCOUNTER — Encounter (INDEPENDENT_AMBULATORY_CARE_PROVIDER_SITE_OTHER): Payer: Self-pay

## 2023-10-07 ENCOUNTER — Telehealth: Payer: Self-pay | Admitting: Infectious Diseases

## 2023-10-07 ENCOUNTER — Other Ambulatory Visit: Payer: Self-pay | Admitting: Infectious Diseases

## 2023-10-07 MED ORDER — LINEZOLID 600 MG PO TABS: 600.0000 mg | ORAL_TABLET | Freq: Every day | ORAL | 1 refills | Status: DC

## 2023-10-07 MED ORDER — AZITHROMYCIN 500 MG PO TABS: 500.0000 mg | ORAL_TABLET | Freq: Every day | ORAL | 1 refills | Status: DC

## 2023-10-07 NOTE — Telephone Encounter (Signed)
 Pt has mycobacterium chelonae infection of the soft tissue of  foot-no bone involvement- on azithromycin  and linezolid  and has no GI symptoms- all resolved . Sent refill on linezolid  and azithromycin  .Saw Dr.Fowler yesterday- Anise Kerns was taken and appeared normal  Will see her on 10/15/23

## 2023-10-13 ENCOUNTER — Other Ambulatory Visit (HOSPITAL_COMMUNITY): Payer: Self-pay

## 2023-10-15 ENCOUNTER — Ambulatory Visit: Attending: Infectious Diseases | Admitting: Infectious Diseases

## 2023-10-15 ENCOUNTER — Encounter: Payer: Self-pay | Admitting: Infectious Diseases

## 2023-10-15 ENCOUNTER — Other Ambulatory Visit
Admission: RE | Admit: 2023-10-15 | Discharge: 2023-10-15 | Disposition: A | Discharge status: Home / Self Care | Source: Ambulatory Visit | Attending: Infectious Diseases | Admitting: Infectious Diseases

## 2023-10-15 VITALS — BP 105/68 | HR 86 | Temp 98.2°F

## 2023-10-15 DIAGNOSIS — Z803 Family history of malignant neoplasm of breast: Secondary | ICD-10-CM | POA: Insufficient documentation

## 2023-10-15 DIAGNOSIS — H539 Unspecified visual disturbance: Secondary | ICD-10-CM | POA: Diagnosis not present

## 2023-10-15 DIAGNOSIS — A318 Other mycobacterial infections: Secondary | ICD-10-CM | POA: Insufficient documentation

## 2023-10-15 DIAGNOSIS — C9110 Chronic lymphocytic leukemia of B-cell type not having achieved remission: Secondary | ICD-10-CM | POA: Insufficient documentation

## 2023-10-15 DIAGNOSIS — Z923 Personal history of irradiation: Secondary | ICD-10-CM | POA: Insufficient documentation

## 2023-10-15 DIAGNOSIS — H547 Unspecified visual loss: Secondary | ICD-10-CM | POA: Insufficient documentation

## 2023-10-15 DIAGNOSIS — Z79899 Other long term (current) drug therapy: Secondary | ICD-10-CM | POA: Diagnosis not present

## 2023-10-15 DIAGNOSIS — L089 Local infection of the skin and subcutaneous tissue, unspecified: Secondary | ICD-10-CM | POA: Diagnosis not present

## 2023-10-15 DIAGNOSIS — B0229 Other postherpetic nervous system involvement: Secondary | ICD-10-CM | POA: Diagnosis not present

## 2023-10-15 DIAGNOSIS — G500 Trigeminal neuralgia: Secondary | ICD-10-CM | POA: Diagnosis not present

## 2023-10-15 DIAGNOSIS — Z853 Personal history of malignant neoplasm of breast: Secondary | ICD-10-CM | POA: Insufficient documentation

## 2023-10-15 LAB — CBC WITH DIFFERENTIAL/PLATELET
Abs Immature Granulocytes: 0.02 10*3/uL (ref 0.00–0.07)
Basophils Absolute: 0.1 10*3/uL (ref 0.0–0.1)
Basophils Relative: 1 %
Eosinophils Absolute: 0 10*3/uL (ref 0.0–0.5)
Eosinophils Relative: 0 %
HCT: 34.2 % — ABNORMAL LOW (ref 36.0–46.0)
Hemoglobin: 10.6 g/dL — ABNORMAL LOW (ref 12.0–15.0)
Immature Granulocytes: 0 %
Lymphocytes Relative: 50 %
Lymphs Abs: 3.7 10*3/uL (ref 0.7–4.0)
MCH: 26.4 pg (ref 26.0–34.0)
MCHC: 31 g/dL (ref 30.0–36.0)
MCV: 85.1 fL (ref 80.0–100.0)
Monocytes Absolute: 0.6 10*3/uL (ref 0.1–1.0)
Monocytes Relative: 8 %
Neutro Abs: 3 10*3/uL (ref 1.7–7.7)
Neutrophils Relative %: 41 %
Platelets: 240 10*3/uL (ref 150–400)
RBC: 4.02 MIL/uL (ref 3.87–5.11)
RDW: 17.7 % — ABNORMAL HIGH (ref 11.5–15.5)
WBC: 7.3 10*3/uL (ref 4.0–10.5)
nRBC: 0 % (ref 0.0–0.2)

## 2023-10-15 LAB — COMPREHENSIVE METABOLIC PANEL WITH GFR
ALT: 21 U/L (ref 0–44)
AST: 19 U/L (ref 15–41)
Albumin: 3.9 g/dL (ref 3.5–5.0)
Alkaline Phosphatase: 79 U/L (ref 38–126)
Anion gap: 9 (ref 5–15)
BUN: 14 mg/dL (ref 8–23)
CO2: 25 mmol/L (ref 22–32)
Calcium: 9 mg/dL (ref 8.9–10.3)
Chloride: 101 mmol/L (ref 98–111)
Creatinine, Ser: 0.65 mg/dL (ref 0.44–1.00)
GFR, Estimated: >60 mL/min (ref >60)
Glucose, Bld: 182 mg/dL — ABNORMAL HIGH (ref 70–99)
Potassium: 4.6 mmol/L (ref 3.5–5.1)
Sodium: 135 mmol/L (ref 135–145)
Total Bilirubin: 0.8 mg/dL (ref 0.0–1.2)
Total Protein: 6.3 g/dL — ABNORMAL LOW (ref 6.5–8.1)

## 2023-10-15 NOTE — Patient Instructions (Signed)
  During your visit, we discussed your ongoing treatment for the Mycobacterium infection in your leg, your chronic lymphocytic leukemia (CLL), and the swelling in your legs. You reported improvement in your leg condition and increased energy levels. We reviewed your current medications and discussed potential adjustments to your treatment plan.  YOUR PLAN:  -INFECTION DUE TO MYCOBACTERIUM chelonae: This infection likely came from contaminated water or spa treatments and is currently being treated with azithromycin  and linezolid . You are tolerating these medications well, and the infection is confined to your skin and soft tissue. Continue taking azithromycin  and linezolid  as prescribed. If progress is insufficient, we may consider adding a third medication, but be aware it could cause nausea and have high copays. Follow up with Dr. Harwood Lingo in June.  -CHRONIC LYMPHOCYTIC LEUKEMIA (CLL): CLL is a type of cancer that affects your blood and bone marrow, and it is currently slowing the progress of your Mycobacterium infection. There are no significant symptoms from CLL at this time.  -VENOUS EDEMA: Venous edema is swelling in your legs caused by poor blood circulation, leading to fluid buildup. To manage this, wear compression stockings in the morning and remove them at night, keep your legs elevated when sitting, and do daily ankle exercises to maintain flexibility.  INSTRUCTIONS:  Please continue taking azithromycin  and linezolid  as prescribed. Follow up with Dr. Harwood Lingo in June. Wear your compression stockings in the morning and remove them at night, keep your legs elevated when sitting, and do daily ankle exercises. If you notice any worsening of your symptoms or have any concerns, please contact our office.

## 2023-10-15 NOTE — Progress Notes (Signed)
 NAME: Joanna Phillips  DOB: [birth year]  MRN: [id number]  Date/Time: 10/15/2023 9:20 AM   Subjective:  Follow up visit here with her husband MATTILYNN FORRER is a 73 year old female with chronic lymphocytic leukemia, rt ca breast DCIS s/p lumpectomy    Pt was seen 2 weeks ago  ago for mycobacterium chelonae infeciton of skin and soft tissue of the foot . Restarted on azithromycin  and added linezolid  and she is doing much better Prior to that she was on bactrim  ( given as there was a question of nocardia in the culture which later was confirmed by [facility] as mycobacterium chelonae), Doxy and azithromycin  She had severe nausea, vomiting and weakness- and all three were stopped ( Doxy was also not working as it was resistant) Then after a week reintrodiced azithromycin  and linezolid   10/01/23   She was referred by Dr. Althea Atkinson for  management of her Mycobacterium chelonae infection on 09/01/23. She was started on Azithromycin  500mg  Once a day, Bactrim  DS 1 BID and doxy was continued She was  hospitalized at [facility] on 09/02/23 for shaking and fever, where multiple tests were conducted, all returning negative results. She received IV zosyn and vanco  and was discharged after her fever subsided. The fever was initially suspected to be related to her foot infection, but no definitive cause was identified. The attending physciian had spoken to me then- I was concerned about bactrim  reaction but he said the fever resolved even on bactrim  Last visit a week ago she had c/o recent onset of worsening hearing ( baseline had some hearing loss rt ear due to a bad case of shingles and vision issues, with decreased hearing on the right side and a sudden change in vision. She went to [facility] and has been prescribed prescribed difluprednate and atropine eye drops for anterior uveitis- She was  referred to [facility] ophthalmology and saw them this week and asked to continue the same drops and they saw some improvement in the  eye    Medical history  The left foot infection began before February 2025 with a small bump that progressively enlarged. Surgical intervention was performed on August 11, 2023, to excise the infection. Post-surgery, she received home health care for wound dressing three times a week. . She completed a course of Ciprofloxacin  and Doxycycline , which initially seemed to help, but new lesions  appeared. 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 She takes gabapentin  800 mg three times a day and oxcarbazepine  three times a day for pain management. She also takes omeprazole  20 mg daily.  Her past medical history includes chronic lymphocytic leukemia diagnosed in 2011 or 2012, currently under observation without active treatment. She also had right-sided breast cancer in 2018, treated with surgery and radiation, but no chemotherapy. No history of diabetes, hypertension, or heart issues.   She has not engaged in yard work or been scratched by pets. She had a pedicure earlier this year, performed by her licensed niece, which involved soaking her feet in a whirlpool.  Past Medical History:  Diagnosis Date   Arthritis    Breast cancer (HCC) 2017   DCIS   CLL (chronic lymphocytic leukemia) (HCC) 2011   DCIS (ductal carcinoma in situ) 03/11/2016   GERD (gastroesophageal reflux disease)  Hyponatremia    Personal history of radiation therapy 2017    Past Surgical History:  Procedure Laterality Date   BREAST BIOPSY Right 02/12/2016   DCIS   BREAST BIOPSY Right 12/27/2019   path pending, calcs, x marker   BREAST LUMPECTOMY Right 03/11/2016   DCIS neg margins   CHOLECYSTECTOMY  1996   COLONOSCOPY  2013   COLONOSCOPY WITH PROPOFOL  N/A 04/19/2021   Procedure: COLONOSCOPY WITH PROPOFOL ;  Surgeon: Quintin Buckle,  DO;  Location: [facility] ENDOSCOPY;  Service: Gastroenterology;  Laterality: N/A;   IRRIGATION AND DEBRIDEMENT FOOT Left 08/14/2023   Procedure: IRRIGATION AND DEBRIDEMENT FOOT;  Surgeon: Anell Baptist, DPM;  Location: [facility] ORS;  Service: Orthopedics/Podiatry;  Laterality: Left;   squamous cell skin on scalp surgery Right     Social History   Socioeconomic History   Marital status: Married    Spouse name: Not on file   Number of children: Not on file   Years of education: Not on file   Highest education level: Not on file  Occupational History   Occupation: retired   Tobacco Use   Smoking status: Never   Smokeless tobacco: Never  Vaping Use   Vaping status: Never Used  Substance and Sexual Activity   Alcohol  use: No   Drug use: No   Sexual activity: Not on file  Other Topics Concern   Not on file  Social History Narrative   Not on file   Social Drivers of Health   Financial Resource Strain: Low Risk  (09/25/2023)   Received from [facility]   Overall Financial Resource Strain (CARDIA)    Difficulty of Paying Living Expenses: Not hard at all  Food Insecurity: No Food Insecurity (09/25/2023)   Received from [facility]   Hunger Vital Sign    Worried About Running Out of Food in the Last Year: Never true    Ran Out of Food in the Last Year: Never true  Transportation Needs: No Transportation Needs (09/25/2023)   Received from [facility] - Transportation    In the past 12 months, has lack of transportation kept you from medical appointments or from getting medications?: No    Lack of Transportation (Non-Medical): No  Physical Activity: Insufficiently Active (01/11/2019)   Exercise Vital Sign    Days of Exercise per Week: 3 days    Minutes of Exercise per Session: 30 min  Stress: No Stress Concern Present (01/11/2019)   [employer] of Occupational Health - Occupational Stress Questionnaire    Feeling of Stress :  Not at all  Social Connections: Moderately Integrated (08/13/2023)   Social Connection and Isolation Panel [NHANES]    Frequency of Communication with Friends and Family: More than three times a week    Frequency of Social Gatherings with Friends and Family: More than three times a week    Attends Religious Services: More than 4 times per year    Active Member of [employer] or Organizations: No    Attends Banker Meetings: Never    Marital Status: Married  Catering manager Violence: Not At Risk (08/13/2023)   Humiliation, Afraid, Rape, and Kick questionnaire    Fear of Current or Ex-Partner: No    Emotionally Abused: No    Physically Abused: No    Sexually Abused: No    Family History  Problem Relation Age of Onset   Cancer Father        lung  Diabetes Mother    Breast cancer Maternal Aunt 15   Breast cancer Maternal Aunt 58   Allergies  Allergen Reactions   Amoxicillin Hives   Hydrocodone Itching   Sulfamethoxazole -Trimethoprim  Nausea Only   Allopurinol  Rash   I? Current Outpatient Medications  Medication Sig Dispense Refill   acetaminophen  (TYLENOL ) 650 MG CR tablet Take 1 tablet (650 mg total) by mouth every 8 (eight) hours as needed for pain.     atropine 1 % ophthalmic solution Place 1 drop into both eyes 2 (two) times daily     azithromycin  (ZITHROMAX ) 500 MG tablet Take 1 tablet (500 mg total) by mouth daily. 30 tablet 1   calcium -vitamin D (OSCAL WITH D) 500-200 MG-UNIT TABS tablet Take 1 tablet by mouth daily.     Carboxymethylcellulose Sodium (REFRESH CELLUVISC OP) Apply 1 drop to eye every 4 (four) hours as needed.     Difluprednate 0.05 % EMUL Place 1 drop into both eyes 6 (six) times daily     fluticasone  (FLONASE ) 50 MCG/ACT nasal spray Place into the nose.     gabapentin  (NEURONTIN ) 800 MG tablet Take 1 tablet (800 mg total) by mouth 3 (three) times daily. Home med. 270 tablet 1   linezolid  (ZYVOX ) 600 MG tablet Take 1 tablet (600 mg total) by mouth  daily. 30 tablet 1   Multiple Vitamin (MULTIVITAMIN WITH MINERALS) TABS tablet Take 1 tablet by mouth daily.     omeprazole  (PRILOSEC) 20 MG capsule Take 1 capsule by mouth once daily 90 capsule 3   OXcarbazepine  (TRILEPTAL ) 150 MG tablet Take 300 mg by mouth 2 (two) times daily.     potassium chloride (KLOR-CON) 10 MEQ tablet Take 10 mEq by mouth daily.     torsemide (DEMADEX) 20 MG tablet Take 20 mg by mouth daily.     vitamin B-12 (CYANOCOBALAMIN ) 250 MCG tablet Take 250 mcg by mouth daily.     No current facility-administered medications for this visit.     Abtx:  Anti-infectives (From admission, onward)    None       REVIEW OF SYSTEMS:  Const: negative fever, negative chills, negative weight loss Eyes: negative diplopia or visual changes, negative eye pain ENT: negative coryza, negative sore throat Resp: negative cough, hemoptysis, dyspnea Cards: negative for chest pain, palpitations, lower extremity edema GU: negative for frequency, dysuria and hematuria GI:  no  abdominal pain, no nausea vomiting, appetitie much better Skin: negative for rash and pruritus Heme: negative for easy bruising and gum/nose bleeding MS: more energy and is able to walk a little Neurolo:no headaches Psych: negative for feelings of anxiety, depression  Endocrine: negative for thyroid, diabetes Allergy/Immunology- Amoxicillin HIVEs Sulfa  nausea  Objective:  VITALS:  BP 105/68   Pulse 86   Temp 98.2 F (36.8 C) (Temporal)   SpO2 97%   PHYSICAL EXAM:  General: Alert, cooperative, no distress, appears stated age.  Head: Normocephalic, without obvious abnormality, atraumatic. Eyes: Conjunctivae clear, anicteric sclerae. Pupils are equal ENT Nares normal. No drainage or sinus tenderness. Lips, mucosa, normal. No Thrush Tongue wasting on the rt side - shifted to the rt on protrusion  Neck: Supple, symmetrical, no adenopathy, thyroid: non tender no carotid bruit and no JVD.  Lungs: Clear  to auscultation bilaterally. No Wheezing or Rhonchi. No rales. Heart: Regular rate and rhythm, no murmur, rub or gallop. Abdomen: Soft,minimal tenderness - Bowel sounds N Extremities: b/l venous edema Today 10/15/23     Left > rt Left foot near  the 3/4 th toe- ulcerated wound on the dorsum, much better    4/15     Skin: No rashes or lesions. Or bruising Lymph: Cervical, supraclavicular normal. Neurologic: tongue paralysis rt side   Microbiology: 08/14/23 Microbiology of wound mycobacterium chelonae Confirmed Nocardia not there and it has been corrected     IMAGING RESULTS: MRI of left foot- 08/13/23  Mild subcutaneous fat edema and swelling throughout the dorsolateral midfoot, greatest at the level of the metatarsal shafts. No walled-off fluid collection to indicate an abscess. 2. No evidence of acute osteomyelitis.   I have personally reviewed the films ? Impression/Recommendation ?Mycobacterium chelonae f Left foot infection involving soft tissue- no bones involved  - No nocardia- [facility] confirmed it   surgical debridement on August 11, 2023. MRI from March shows no bone involvemen Pt was on doxy, azithromycin  and bactrim ( this was given because Nocardia was reported previously) Susceptibility was released on 08/24/23 and doxy resistant   Treatment requires a 3-4 month antibiotic regimen.   Because of  severe GI symptoms ( abdominal pain, poor appetitie, nausea and vomiting)  and she has inadvertently been taking double the dose of azithromycin   all antibiotics stopped on5/8/25  and her symptoms resolved Restarted azithromycin  on 10/01/23 and linezolid  3 days later on 10/04/23 Tolerating both very well She may need a third antibiotic if not improving which would be  Tigecycline or omadacycline or clofazamine but she is worried about Gi side effects and copay cost   Recent unexplained admission with shaking and fever to [facility] This has resolved  Vision  impairment- followed at [facility]- anterior uveitis ( there is a concern that it could be due to chelonae - which makes the infeciton systemic - so will check blood culture for afb  Chronic lymphocytic leukemia (CLL)   Stage 1 CLL is under observation with no active treatment. R]Followed by Dr.Rao- recent immunoglobulin level shows normal IgG but low IgM and IgA.   Postherpetic neuralgia  /trigeminal neuralgia In feb had radifrequecy ablation of rt trigeminal nerve- on oxcarbazepine .(Trileptal ) and gabapentin   Persistent pain post-shingles infection in 2017 affects the right side of the head and neck. Continue management with gabapentin  and   Breast cancer (right side, in situ)   Right-sided breast cancer was treated with surgery and radiation in 2018. No chemotherapy was administered. ? ? Follow up 2weeks  Labs will be sent today- CBC/CMP  Discussed in detail with patient and her husband  She will see Dr.Fitzgerald while I am away in June I will see her in July  ________________________________________________

## 2023-10-20 ENCOUNTER — Ambulatory Visit: Payer: Self-pay

## 2023-10-20 NOTE — Telephone Encounter (Signed)
-----   Message from [facility] sent at 10/15/2023 10:50 PM EDT ----- Please let her know that CBC/CMP stable and fine- Except blood glucose which is 80. Follow with PCP ----- Message ----- From: Interface, Lab In [place] Sent: 10/15/2023  10:07 AM EDT To: Alica Inks, MD

## 2023-10-20 NOTE — Telephone Encounter (Signed)
 Patient informed of labs and verbalized understanding. Natale Barba Jonathon Resides, CMA

## 2023-11-13 LAB — MISC LABCORP TEST (SEND OUT): Labcorp test code: 183764

## 2023-11-24 NOTE — Progress Notes (Signed)
 Chief Complaint:   Chief Complaint  Patient presents with  . Follow-up    Recheck left foot abscess    Subjective   HPI  Joanna Phillips is a 73 y.o. female who presents for Follow-up (Recheck left foot abscess) History of Present Illness Joanna Phillips is a 73 year old female who presents for follow-up care of a chronic wound on her foot.  She has a chronic wound on her foot that has been present for an extended period. The wound is superficial, not infected, and healing slowly. It is usually not painful, but she experiences occasional pain. Swelling in her feet has decreased, though some remains. The wound measures 1.9 by 1.1 cm at its largest point.  She has been receiving care from a home nurse named Lorenza, who is currently on vacation. Another nurse was scheduled to visit, but the appointment was postponed due to today's visit. There is a history of sending tissue samples to a pathologist, and the condition has been discussed with a dermatologist. She has previously had a bacterial infection treated by a provider named Epifanio and has also seen a provider named Fredia Fresh.  She is not diabetic and is not on any diabetic medications, which affects the coverage for certain treatments like grafting. Her primary insurance is Medicare.  Review of Systems  Patient Active Problem List  Diagnosis  . CLL (chronic lymphocytic leukemia) (CMS/HHS-HCC)  . Postherpetic neuralgia  . Breast calcification, right  . Subclinical hypothyroidism  . Prediabetes  . Healthcare maintenance  . GERD (gastroesophageal reflux disease)  . History of ductal carcinoma in situ (DCIS) of breast  . Trigeminal neuralgia  . Low serum vitamin B12  . Arthritis of knee  . Hyponatremia  . Leg pain  . Productive cough  . Edema of both legs    Outpatient Medications Prior to Visit  Medication Sig Dispense Refill  . acetaminophen  (TYLENOL ) 500 mg capsule Take 1,000 mg by mouth 2 (two) times daily Patient reports  taking 2 tablets in the morning and evening.    . acetaminophen  (TYLENOL ) 650 MG ER tablet Take 650 mg by mouth every 8 (eight) hours as needed for Pain    . ascorbic acid , vitamin C , (VITAMIN C ) 500 MG tablet Take 500 mg by mouth once daily    . aspirin  81 MG EC tablet Take 1 tablet (81 mg total) by mouth once daily  0  . aspirin  81 MG EC tablet Take 81 mg by mouth once daily    . atropine 1 % ophthalmic solution Place 1 drop into both eyes 2 (two) times daily    . azithromycin  (ZITHROMAX ) 500 MG tablet Take 1 tablet (500 mg total) by mouth once daily for 60 doses If needed for travelers diarrhea. 30 tablet 1  . calcium  carbonate-vitamin D3 (OS-CAL 500+D) 500 mg(1,250mg ) -200 unit tablet Take 1 tablet by mouth once daily    . cholecalciferol  (VITAMIN D3) 2,000 unit tablet Take 1 tablet (2,000 Units total) by mouth once daily 30 tablet 11  . cyanocobalamin  (VITAMIN B12) 1000 MCG tablet Take 1,000 mcg by mouth once daily    . cyanocobalamin  (VITAMIN B12) 500 MCG tablet Take 500 mcg by mouth once daily    . difluprednate (DUREZOL) 0.05 % ophthalmic emulsion Place 1 drop into both eyes 6 (six) times daily    . fluticasone  propionate (FLONASE ) 50 mcg/actuation nasal spray PLACE 2 SPRAYS INTO BOTH NOSTRILS ONCE DAILY 48 g 4  . gabapentin  (NEURONTIN ) 800 MG tablet  Take 1 tablet (800 mg total) by mouth 3 (three) times daily 270 tablet 3  . guaiFENesin  (MUCINEX ) 600 mg SR tablet Take 600 mg by mouth every 12 (twelve) hours as needed for Cough    . linezolid  (ZYVOX ) 600 mg tablet Take 1 tablet (600 mg total) by mouth once daily for 30 days 30 tablet 0  . omeprazole  (PRILOSEC) 20 MG DR capsule TAKE ONE CAPSULE BY MOUTH ONE TIME DAILY 90 capsule 3  . ondansetron  (ZOFRAN -ODT) 4 MG disintegrating tablet PLACE ONE TABLET ON THE TONGUE EVERY 8 HOURS AS NEEDED FOR NAUSEA 20 tablet 0  . OXcarbazepine  (TRILEPTAL ) 150 MG tablet Take 150 mg by mouth Every 8 (eight) hours    . potassium chloride (KLOR-CON) 10 MEQ ER  tablet Take 1 tablet (10 mEq total) by mouth once daily 30 tablet 11  . TORsemide (DEMADEX) 20 MG tablet Take 1 tablet (20 mg total) by mouth once daily 30 tablet 11   No facility-administered medications prior to visit.      Objective   Vitals:   11/24/23 0918  BP: 107/72  Weight: 85.7 kg (189 lb)  Height: 165.1 cm (5' 5)  PainSc: 0-No pain   Body mass index is 31.45 kg/m.  Home Vitals:     Stable residual wound to the dorsal aspect of the left foot.  Granulomatous tissue with mild fibrotic tissue no signs of acute infection at this time.  No purulence.  No lymphangitic streaking.     The following labs were personally reviewed: - Lab Results  Component Value Date   WBC 7.1 11/17/2023   HGB 11.4 (L) 11/17/2023   HCT 34.9 (L) 11/17/2023   PLT 228 11/17/2023   - Lab Results  Component Value Date   NA 133 (L) 11/17/2023   K 4.7 11/17/2023   CL 98 11/17/2023   CO2 25.5 11/17/2023   BUN 16 11/17/2023   CREATININE 0.7 11/17/2023   GLUCOSE 102 11/17/2023   - Lab Results  Component Value Date   NA 133 (L) 11/17/2023   K 4.7 11/17/2023   CL 98 11/17/2023   CO2 25.5 11/17/2023   BUN 16 11/17/2023   CREATININE 0.7 11/17/2023   CALCIUM  9.1 11/17/2023   ALB 4.5 11/17/2023   TBILI 0.4 11/17/2023   ALKPHOS 79 11/17/2023   AST 20 11/17/2023   ALT 12 11/17/2023   GLUCOSE 102 11/17/2023   GFR 91 11/17/2023   - Lab Results  Component Value Date   HGBA1C 6.1 (H) 10/13/2023      Results Procedure: Wound dressing application Description: A dressing was applied to the wound on the foot. The wound was noted to be uninfected with reduced swelling.  PATHOLOGY Appearance of pyoderma gangrenosum     Assessment/Plan:   Assessment & Plan Chronic left foot wound The chronic wound on the left foot is non-infected, superficial, and exhibits some swelling. It is healing slowly, possibly due to a condition like pyoderma gangrenosum. The wound is not diabetic  ulcer, affecting Medicare coverage for grafting. A biopsy is unnecessary as it would not alter management. - Keep the wound padded, covered, and bandaged. - Avoid applying pressure to the wound. - Follow up with Dr. Fayette in four weeks. - Schedule a follow-up appointment in six weeks to reassess the wound.  Diagnoses and all orders for this visit:  Abscess of left foot  Foot ulcer, left, with fat layer exposed (CMS/HHS-HCC)          Future Appointments  Date/Time Provider Department Center Visit Type   12/01/2023 11:00 AM Lenon Layman Tanda DOUGLAS, MD [facility] MARYL BROCKS [facility] OFFICE VISIT   12/02/2023 9:45 AM Maree Jannett Hering, MD [facility] C RETURN VISIT   12/15/2023 11:00 AM Tobin Venus Gelineau, MD [facility] UVEITIS RETURN   01/05/2024 10:15 AM Ashley Eva Dawn, DPM [facility] C RETURN PODIATRY   03/28/2024 9:45 AM Alluri, Keller Grist, MD [facility] C FOLLOW UP       There are no Patient Instructions on file for this visit.   This note has been created using automated tools and reviewed for accuracy by JUSTIN ALLEN FOWLER.

## 2023-12-01 NOTE — Progress Notes (Signed)
 Joanna Phillips is a 73 y.o. female here for follow up of their medical problems  CHIEF COMPLAINT:  Follow up medical problems in the problem list and as discussed in the history and assessment areas as well as new complaints as listed.   Patient Active Problem List  Diagnosis  . CLL (chronic lymphocytic leukemia) (CMS/HHS-HCC)  . Postherpetic neuralgia  . Breast calcification, right  . Subclinical hypothyroidism  . Prediabetes  . Healthcare maintenance  . GERD (gastroesophageal reflux disease)  . History of ductal carcinoma in situ (DCIS) of breast  . Trigeminal neuralgia  . Low serum vitamin B12  . Arthritis of knee  . Hyponatremia  . Edema of both legs     HISTORY OF PRESENT ILLNESS:  Edema of both legs Stable on torsemide   Subclinical hypothyroidism Has been subclinical   Prediabetes Glucose is followed and controlled on diet   Low serum vitamin B12 Replaced and followed   Past Medical History:  Diagnosis Date  . Adenopathy    Dr Brooks  . Allergy ?  SABRA Anemia 12/14/2015  . Arthritis   . Bleeds easily ()   . BPPV (benign paroxysmal positional vertigo) 06/28/2015   Last Assessment & Plan:  History of BPPV- given current sinus issues and ear fullness. I think balance issues are vertigo related. Continue flonase . Afrin for ear fullness. PRN meclizine .   SABRA CKD (chronic kidney disease) 02/13/2016  . CLL (chronic lymphocytic leukemia) (CMS/HHS-HCC) 04/18/2010   Last Assessment & Plan:  Check CBC per hematology request.  . Disseminated herpes zoster 12/21/2015  . Ductal carcinoma in situ of right breast 02/12/2016  . GERD (gastroesophageal reflux disease)   . Heart murmur [id number]  . History of radiation therapy   . Lymphocytosis    Dr Brooks  . Periorbital cellulitis of right eye 12/24/2015  . Postherpetic neuralgia 01/22/2016  . Shingles 12/16/2015  . Subclinical hypothyroidism 10/11/2015   Last Assessment & Plan:  Recheck TSH , T3, and t4. Start therapy if  abnormal. Consider endocrine referral if continued.   . Venous insufficiency 09/29/2013    Past Surgical History:  Procedure Laterality Date  . TOOTH EXTRACTION  1975   Removal of infected tooth  . LAPAROSCOPIC CHOLECYSTECTOMY  1996  . PERCUTANEOUS BIOPSY BREAST Right 02/12/2016   ductal carcinoma in situ, grade 3, ER/PR not performed  . MASTECTOMY PARTIAL Right 03/11/2016   Procedure: MASTECTOMY, PARTIAL- bracketed wire loc x2;  Surgeon: Delon Shawnee Lares, MD;  Location: [facility];  Service: General Surgery;  Laterality: Right;  . RETROMASTOID CRANIECTOMY FOR MICROVASCULAR DECOMP Right 02/22/2021   Procedure: RETROMASTOID CRANIECTOMY FOR MICROVASCULAR DECOMP;  Surgeon: Phil Hasten, MD;  Location: [facility];  Service: Neurosurgery;  Laterality: Right;  . MICROSURGERY N/A 02/22/2021   Procedure: MICROSURGERY;  Surgeon: Phil Hasten, MD;  Location: [facility];  Service: Neurosurgery;  Laterality: N/A;  . COLONOSCOPY  04/19/2021   Tubular adenomas/Repeat 89yrs/[facility]  . BREAST BIOPSY    . BREAST LUMPECTOMY    . CHOLECYSTECTOMY  ???  . SCC cancer removed - August 2022     Scalp  . TOOTH EXTRACTION     Wisdom teeth removed     No fever chills or sweats   No nausea, vomiting or diarrhea  No chest pain, shortness of breath   Social History   Socioeconomic History  . Marital status: Married  Tobacco Use  . Smoking status: Never  . Smokeless tobacco: Never  Vaping Use  . Vaping  status: Never Used  Substance and Sexual Activity  . Alcohol  use: Never  . Drug use: Never  . Sexual activity: Not Currently    Partners: Male    Birth control/protection: Pill, Post-menopausal  Social History Narrative   ** Merged History Encounter **       Married (husband, 49 years in 2019), 2 children (daughter 46y, son 43y as of 02/29/2016). Closest relative: Scarlett Portlock (husband) Education: 12th grade Occupation: Production manager Religious preference: Baptist Exercise: 3x/week, swimming,  biking Diet: Keto    with her husband   Social Drivers of Corporate investment banker Strain: Low Risk  (10/20/2023)   Overall Financial Resource Strain (CARDIA)   . Difficulty of Paying Living Expenses: Not hard at all  Food Insecurity: No Food Insecurity (10/20/2023)   Hunger Vital Sign   . Worried About Programme researcher, broadcasting/film/video in the Last Year: Never true   . Ran Out of Food in the Last Year: Never true  Transportation Needs: No Transportation Needs (10/20/2023)   PRAPARE - Transportation   . Lack of Transportation (Medical): No   . Lack of Transportation (Non-Medical): No  Physical Activity: Insufficiently Active (01/11/2019)   [facility]   Exercise Vital Sign   . On average, how many days per week do you engage in moderate to strenuous exercise (like a brisk walk)?: 3 days   . On average, how many minutes do you engage in exercise at this level?: 30 min  Stress: No Stress Concern Present (01/11/2019)   Received from [facility] - Occupational Stress Questionnaire   . Feeling of Stress : Not at all  Social Connections: Moderately Integrated (08/13/2023)   Received from [facility]   Social Connection and Isolation Panel   . In a typical week, how many times do you talk on the phone with family, friends, or neighbors?: More than three times a week   . How often do you get together with friends or relatives?: More than three times a week   . How often do you attend church or religious services?: More than 4 times per year   . Do you belong to any clubs or organizations such as church groups, unions, fraternal or athletic groups, or school groups?: No   . How often do you attend meetings of the clubs or organizations you belong to?: Never   . Are you married, widowed, divorced, separated, never married, or living with a partner?: Married  Housing Stability: Low Risk  (11/17/2023)   Housing Stability Vital Sign   . Unable to Pay for  Housing in the Last Year: No   . Number of Times Moved in the Last Year: 0   . Homeless in the Last Year: No      Current Outpatient Medications:  .  acetaminophen  (TYLENOL ) 500 mg capsule, Take 1,000 mg by mouth 2 (two) times daily Patient reports taking 2 tablets in the morning and evening., Disp: , Rfl:  .  acetaminophen  (TYLENOL ) 650 MG ER tablet, Take 650 mg by mouth every 8 (eight) hours as needed for Pain, Disp: , Rfl:  .  ascorbic acid , vitamin C , (VITAMIN C ) 500 MG tablet, Take 500 mg by mouth once daily, Disp: , Rfl:  .  aspirin  81 MG EC tablet, Take 81 mg by mouth once daily, Disp: , Rfl:  .  atropine 1 % ophthalmic solution, Place 1 drop into both eyes 2 (two) times daily,  Disp: , Rfl:  .  azithromycin  (ZITHROMAX ) 500 MG tablet, Take 1 tablet (500 mg total) by mouth once daily for 60 doses If needed for travelers diarrhea., Disp: 30 tablet, Rfl: 1 .  calcium  carbonate-vitamin D3 (OS-CAL 500+D) 500 mg(1,250mg ) -200 unit tablet, Take 1 tablet by mouth once daily, Disp: , Rfl:  .  cholecalciferol  (VITAMIN D3) 2,000 unit tablet, Take 1 tablet (2,000 Units total) by mouth once daily, Disp: 30 tablet, Rfl: 11 .  cyanocobalamin  (VITAMIN B12) 1000 MCG tablet, Take 1,000 mcg by mouth once daily, Disp: , Rfl:  .  difluprednate (DUREZOL) 0.05 % ophthalmic emulsion, Place 1 drop into both eyes 6 (six) times daily, Disp: , Rfl:  .  fluticasone  propionate (FLONASE ) 50 mcg/actuation nasal spray, PLACE 2 SPRAYS INTO BOTH NOSTRILS ONCE DAILY, Disp: 48 g, Rfl: 4 .  gabapentin  (NEURONTIN ) 800 MG tablet, Take 1 tablet (800 mg total) by mouth 3 (three) times daily, Disp: 270 tablet, Rfl: 3 .  guaiFENesin  (MUCINEX ) 600 mg SR tablet, Take 600 mg by mouth every 12 (twelve) hours as needed for Cough, Disp: , Rfl:  .  linezolid  (ZYVOX ) 600 mg tablet, Take 1 tablet (600 mg total) by mouth once daily for 30 days, Disp: 30 tablet, Rfl: 0 .  omeprazole  (PRILOSEC) 20 MG DR capsule, TAKE ONE CAPSULE BY MOUTH ONE  TIME DAILY, Disp: 90 capsule, Rfl: 3 .  ondansetron  (ZOFRAN -ODT) 4 MG disintegrating tablet, PLACE ONE TABLET ON THE TONGUE EVERY 8 HOURS AS NEEDED FOR NAUSEA, Disp: 20 tablet, Rfl: 0 .  OXcarbazepine  (TRILEPTAL ) 150 MG tablet, Take 150 mg by mouth Every 8 (eight) hours, Disp: , Rfl:  .  potassium chloride (KLOR-CON) 10 MEQ ER tablet, Take 1 tablet (10 mEq total) by mouth once daily, Disp: 30 tablet, Rfl: 11 .  TORsemide (DEMADEX) 20 MG tablet, Take 1 tablet (20 mg total) by mouth once daily, Disp: 30 tablet, Rfl: 11 .  aspirin  81 MG EC tablet, Take 1 tablet (81 mg total) by mouth once daily (Patient not taking: Reported on 12/01/2023), Disp: , Rfl: 0 .  cyanocobalamin  (VITAMIN B12) 500 MCG tablet, Take 500 mcg by mouth once daily (Patient not taking: Reported on 12/01/2023), Disp: , Rfl:   Vitals:   12/01/23 1117  BP: 136/79  Pulse: 76  Resp: 16   Body mass index is 31.85 kg/m. No acute distress Lungs; clear to ascultation Heart; Regular rate and rhythm  Abdomen; Soft and flat, normal bowel sounds Extremities; No clubbing, cyanosis or edema  Appointment on 11/24/2023  Component Date Value Ref Range Status  . WBC (White Blood Cell Count) 11/24/2023 7.4  4.1 - 10.2 10^3/uL Final  . RBC (Red Blood Cell Count) 11/24/2023 4.10  4.04 - 5.48 10^6/uL Final  . Hemoglobin 11/24/2023 11.9 (L)  12.0 - 15.0 gm/dL Final  . Hematocrit 92/91/7974 36.2  35.0 - 47.0 % Final  . MCV (Mean Corpuscular Volume) 11/24/2023 88.3  80.0 - 100.0 fl Final  . MCH (Mean Corpuscular Hemoglobin) 11/24/2023 29.0  27.0 - 31.2 pg Final  . MCHC (Mean Corpuscular Hemoglobin * 11/24/2023 32.9  32.0 - 36.0 gm/dL Final  . Platelet Count 11/24/2023 249  150 - 450 10^3/uL Final  . RDW-CV (Red Cell Distribution Widt* 11/24/2023 15.8 (H)  11.6 - 14.8 % Final  . MPV (Mean Platelet Volume) 11/24/2023 9.3 (L)  9.4 - 12.4 fl Final  . Neutrophils 11/24/2023 2.91  1.50 - 7.80 10^3/uL Final  . Lymphocytes 11/24/2023 3.52  1.00 -  3.60  10^3/uL Final  . Monocytes 11/24/2023 0.70  0.00 - 1.50 10^3/uL Final  . Eosinophils 11/24/2023 0.18  0.00 - 0.55 10^3/uL Final  . Basophils 11/24/2023 0.04  0.00 - 0.09 10^3/uL Final  . Neutrophil % 11/24/2023 39.5  32.0 - 70.0 % Final  . Lymphocyte % 11/24/2023 47.7  10.0 - 50.0 % Final  . Monocyte % 11/24/2023 9.5  4.0 - 13.0 % Final  . Eosinophil % 11/24/2023 2.4  1.0 - 5.0 % Final  . Basophil% 11/24/2023 0.5  0.0 - 2.0 % Final  . Immature Granulocyte % 11/24/2023 0.4  <=0.7 % Final  . Immature Granulocyte Count 11/24/2023 0.03  <=0.06 10^3/L Final  . Glucose 11/24/2023 122 (H)  70 - 110 mg/dL Final  . Sodium 92/91/7974 135 (L)  136 - 145 mmol/L Final  . Potassium 11/24/2023 4.6  3.6 - 5.1 mmol/L Final  . Chloride 11/24/2023 100  97 - 109 mmol/L Final  . Carbon Dioxide (CO2) 11/24/2023 26.9  22.0 - 32.0 mmol/L Final  . Urea Nitrogen (BUN) 11/24/2023 19  7 - 25 mg/dL Final  . Creatinine 92/91/7974 0.9  0.6 - 1.1 mg/dL Final  . Glomerular Filtration Rate (eGFR) 11/24/2023 68  >60 mL/min/1.73sq m Final  . Calcium  11/24/2023 9.4  8.7 - 10.3 mg/dL Final  . AST  92/91/7974 17  8 - 39 U/L Final  . ALT  11/24/2023 12  5 - 38 U/L Final  . Alk Phos (alkaline Phosphatase) 11/24/2023 81  34 - 104 U/L Final  . Albumin 11/24/2023 4.7  3.5 - 4.8 g/dL Final  . Bilirubin, Total 11/24/2023 0.3  0.3 - 1.2 mg/dL Final  . Protein, Total 11/24/2023 6.6  6.1 - 7.9 g/dL Final  . A/G Ratio 92/91/7974 2.5  1.0 - 5.0 gm/dL Final  Office Visit on 11/17/2023  Component Date Value Ref Range Status  . WBC (White Blood Cell Count) 11/17/2023 7.1  4.1 - 10.2 10^3/uL Final  . RBC (Red Blood Cell Count) 11/17/2023 3.93 (L)  4.04 - 5.48 10^6/uL Final  . Hemoglobin 11/17/2023 11.4 (L)  12.0 - 15.0 gm/dL Final  . Hematocrit 92/98/7974 34.9 (L)  35.0 - 47.0 % Final  . MCV (Mean Corpuscular Volume) 11/17/2023 88.8  80.0 - 100.0 fl Final  . MCH (Mean Corpuscular Hemoglobin) 11/17/2023 29.0  27.0 - 31.2 pg Final  .  MCHC (Mean Corpuscular Hemoglobin * 11/17/2023 32.7  32.0 - 36.0 gm/dL Final  . Platelet Count 11/17/2023 228  150 - 450 10^3/uL Final  . RDW-CV (Red Cell Distribution Widt* 11/17/2023 16.1 (H)  11.6 - 14.8 % Final  . MPV (Mean Platelet Volume) 11/17/2023 9.3 (L)  9.4 - 12.4 fl Final  . Neutrophils 11/17/2023 2.29  1.50 - 7.80 10^3/uL Final  . Lymphocytes 11/17/2023 4.06 (H)  1.00 - 3.60 10^3/uL Final  . Monocytes 11/17/2023 0.47  0.00 - 1.50 10^3/uL Final  . Eosinophils 11/17/2023 0.20  0.00 - 0.55 10^3/uL Final  . Basophils 11/17/2023 0.03  0.00 - 0.09 10^3/uL Final  . Neutrophil % 11/17/2023 32.5  32.0 - 70.0 % Final  . Lymphocyte % 11/17/2023 57.3 (H)  10.0 - 50.0 % Final  . Monocyte % 11/17/2023 6.6  4.0 - 13.0 % Final  . Eosinophil % 11/17/2023 2.8  1.0 - 5.0 % Final  . Basophil% 11/17/2023 0.4  0.0 - 2.0 % Final  . Immature Granulocyte % 11/17/2023 0.4  <=0.7 % Final  . Immature Granulocyte Count 11/17/2023 0.03  <=0.06  10^3/L Final  . Glucose 11/17/2023 102  70 - 110 mg/dL Final  . Sodium 92/98/7974 133 (L)  136 - 145 mmol/L Final  . Potassium 11/17/2023 4.7  3.6 - 5.1 mmol/L Final  . Chloride 11/17/2023 98  97 - 109 mmol/L Final  . Carbon Dioxide (CO2) 11/17/2023 25.5  22.0 - 32.0 mmol/L Final  . Urea Nitrogen (BUN) 11/17/2023 16  7 - 25 mg/dL Final  . Creatinine 92/98/7974 0.7  0.6 - 1.1 mg/dL Final  . Glomerular Filtration Rate (eGFR) 11/17/2023 91  >60 mL/min/1.73sq m Final  . Calcium  11/17/2023 9.1  8.7 - 10.3 mg/dL Final  . AST  92/98/7974 20  8 - 39 U/L Final  . ALT  11/17/2023 12  5 - 38 U/L Final  . Alk Phos (alkaline Phosphatase) 11/17/2023 79  34 - 104 U/L Final  . Albumin 11/17/2023 4.5  3.5 - 4.8 g/dL Final  . Bilirubin, Total 11/17/2023 0.4  0.3 - 1.2 mg/dL Final  . Protein, Total 11/17/2023 6.6  6.1 - 7.9 g/dL Final  . A/G Ratio 92/98/7974 2.1  1.0 - 5.0 gm/dL Final  Office Visit on 10/26/2023  Component Date Value Ref Range Status  . WBC (White Blood Cell  Count) 10/26/2023 7.5  4.1 - 10.2 10^3/uL Final  . RBC (Red Blood Cell Count) 10/26/2023 3.95 (L)  4.04 - 5.48 10^6/uL Final  . Hemoglobin 10/26/2023 11.0 (L)  12.0 - 15.0 gm/dL Final  . Hematocrit 93/90/7974 34.3 (L)  35.0 - 47.0 % Final  . MCV (Mean Corpuscular Volume) 10/26/2023 86.8  80.0 - 100.0 fl Final  . MCH (Mean Corpuscular Hemoglobin) 10/26/2023 27.8  27.0 - 31.2 pg Final  . MCHC (Mean Corpuscular Hemoglobin * 10/26/2023 32.1  32.0 - 36.0 gm/dL Final  . Platelet Count 10/26/2023 268  150 - 450 10^3/uL Final  . RDW-CV (Red Cell Distribution Widt* 10/26/2023 18.2 (H)  11.6 - 14.8 % Final  . MPV (Mean Platelet Volume) 10/26/2023 9.6  9.4 - 12.4 fl Final  . Neutrophils 10/26/2023 2.31  1.50 - 7.80 10^3/uL Final  . Lymphocytes 10/26/2023 4.22 (H)  1.00 - 3.60 10^3/uL Final  . Monocytes 10/26/2023 0.52  0.00 - 1.50 10^3/uL Final  . Eosinophils 10/26/2023 0.36  0.00 - 0.55 10^3/uL Final  . Basophils 10/26/2023 0.05  0.00 - 0.09 10^3/uL Final  . Neutrophil % 10/26/2023 30.9 (L)  32.0 - 70.0 % Final  . Lymphocyte % 10/26/2023 56.5 (H)  10.0 - 50.0 % Final  . Monocyte % 10/26/2023 7.0  4.0 - 13.0 % Final  . Eosinophil % 10/26/2023 4.8  1.0 - 5.0 % Final  . Basophil% 10/26/2023 0.7  0.0 - 2.0 % Final  . Immature Granulocyte % 10/26/2023 0.1  <=0.7 % Final  . Immature Granulocyte Count 10/26/2023 0.01  <=0.06 10^3/L Final  . Glucose 10/26/2023 209 (H)  70 - 110 mg/dL Final  . Sodium 93/90/7974 134 (L)  136 - 145 mmol/L Final  . Potassium 10/26/2023 4.8  3.6 - 5.1 mmol/L Final  . Chloride 10/26/2023 99  97 - 109 mmol/L Final  . Carbon Dioxide (CO2) 10/26/2023 29.2  22.0 - 32.0 mmol/L Final  . Urea Nitrogen (BUN) 10/26/2023 13  7 - 25 mg/dL Final  . Creatinine 93/90/7974 0.7  0.6 - 1.1 mg/dL Final  . Glomerular Filtration Rate (eGFR) 10/26/2023 91  >60 mL/min/1.73sq m Final  . Calcium  10/26/2023 8.9  8.7 - 10.3 mg/dL Final  . AST  93/90/7974 19  8 -  39 U/L Final  . ALT  10/26/2023 15  5  - 38 U/L Final  . Alk Phos (alkaline Phosphatase) 10/26/2023 82  34 - 104 U/L Final  . Albumin 10/26/2023 4.4  3.5 - 4.8 g/dL Final  . Bilirubin, Total 10/26/2023 0.5  0.3 - 1.2 mg/dL Final  . Protein, Total 10/26/2023 6.1  6.1 - 7.9 g/dL Final  . A/G Ratio 93/90/7974 2.6  1.0 - 5.0 gm/dL Final  Appointment on 94/72/7974  Component Date Value Ref Range Status  . WBC (White Blood Cell Count) 10/13/2023 7.7  4.1 - 10.2 10^3/uL Final  . RBC (Red Blood Cell Count) 10/13/2023 4.16  4.04 - 5.48 10^6/uL Final  . Hemoglobin 10/13/2023 11.1 (L)  12.0 - 15.0 gm/dL Final  . Hematocrit 94/72/7974 35.5  35.0 - 47.0 % Final  . MCV (Mean Corpuscular Volume) 10/13/2023 85.3  80.0 - 100.0 fl Final  . MCH (Mean Corpuscular Hemoglobin) 10/13/2023 26.7 (L)  27.0 - 31.2 pg Final  . MCHC (Mean Corpuscular Hemoglobin * 10/13/2023 31.3 (L)  32.0 - 36.0 gm/dL Final  . Platelet Count 10/13/2023 264  150 - 450 10^3/uL Final  . RDW-CV (Red Cell Distribution Widt* 10/13/2023 17.2 (H)  11.6 - 14.8 % Final  . MPV (Mean Platelet Volume) 10/13/2023 9.3 (L)  9.4 - 12.4 fl Final  . Glucose 10/13/2023 123 (H)  70 - 110 mg/dL Final  . Sodium 94/72/7974 139  136 - 145 mmol/L Final  . Potassium 10/13/2023 4.5  3.6 - 5.1 mmol/L Final  . Chloride 10/13/2023 101  97 - 109 mmol/L Final  . Carbon Dioxide (CO2) 10/13/2023 29.2  22.0 - 32.0 mmol/L Final  . Urea Nitrogen (BUN) 10/13/2023 13  7 - 25 mg/dL Final  . Creatinine 94/72/7974 0.6  0.6 - 1.1 mg/dL Final  . Glomerular Filtration Rate (eGFR) 10/13/2023 95  >60 mL/min/1.73sq m Final  . Calcium  10/13/2023 9.4  8.7 - 10.3 mg/dL Final  . AST  94/72/7974 20  8 - 39 U/L Final  . ALT  10/13/2023 22  5 - 38 U/L Final  . Alk Phos (alkaline Phosphatase) 10/13/2023 94  34 - 104 U/L Final  . Albumin 10/13/2023 4.4  3.5 - 4.8 g/dL Final  . Bilirubin, Total 10/13/2023 0.5  0.3 - 1.2 mg/dL Final  . Protein, Total 10/13/2023 6.3  6.1 - 7.9 g/dL Final  . A/G Ratio 94/72/7974 2.3  1.0 - 5.0  gm/dL Final  . Hemoglobin J8R 10/13/2023 6.1 (H)  4.2 - 5.6 % Final  . Average Blood Glucose (Calc) 10/13/2023 128  mg/dL Final  . Cholesterol, Total 10/13/2023 205 (H)  100 - 200 mg/dL Final  . Triglyceride 94/72/7974 213 (H)  35 - 199 mg/dL Final  . HDL (High Density Lipoprotein) Cho* 10/13/2023 54.0  35.0 - 85.0 mg/dL Final  . LDL Calculated 10/13/2023 891  0 - 130 mg/dL Final  . VLDL Cholesterol 10/13/2023 43  mg/dL Final  . Cholesterol/HDL Ratio 10/13/2023 3.8   Final  . Thyroid Stimulating Hormone (TSH) 10/13/2023 3.613  0.450-5.330 uIU/ml uIU/mL Final  Initial consult on 09/25/2023  Component Date Value Ref Range Status  . Vent Rate (bpm) 09/25/2023 69   Final  . PR Interval (msec) 09/25/2023 244   Final  . QRS Interval (msec) 09/25/2023 74   Final  . QT Interval (msec) 09/25/2023 404   Final  . QTc (msec) 09/25/2023 432   Final  Office Visit on 09/22/2023  Component Date Value Ref Range  Status  . Glucose 09/22/2023 130 (H)  70 - 110 mg/dL Final  . Sodium 94/93/7974 131 (L)  136 - 145 mmol/L Final  . Potassium 09/22/2023 5.1  3.6 - 5.1 mmol/L Final  . Chloride 09/22/2023 98  97 - 109 mmol/L Final  . Carbon Dioxide (CO2) 09/22/2023 20.7 (L)  22.0 - 32.0 mmol/L Final  . Calcium  09/22/2023 9.6  8.7 - 10.3 mg/dL Final  . Urea Nitrogen (BUN) 09/22/2023 28 (H)  7 - 25 mg/dL Final  . Creatinine 94/93/7974 1.0  0.6 - 1.1 mg/dL Final  . Glomerular Filtration Rate (eGFR) 09/22/2023 59 (L)  >60 mL/min/1.73sq m Final  . BUN/Crea Ratio 09/22/2023 28.0 (H)  6.0 - 20.0 Final  . Anion Gap w/K 09/22/2023 17.4 (H)  6.0 - 16.0 Final  Ancillary Procedure on 09/17/2023  Component Date Value Ref Range Status  . LV Ejection Fraction (%) 09/17/2023 55  % Final  . Right Ventricle Systolic Pressure * 09/17/2023 42  mmHg Final  . Left Atrium Diameter (cm) 09/17/2023 4.4  cm Final  . LV End Diastolic Diameter (cm) 09/17/2023 4.2  cm Final  . LV End Systolic Diameter (cm) 09/17/2023 2.8  cm Final  .  LV Septum Wall Thickness (cm) 09/17/2023 0.9  cm Final  . LV Posterior Wall Thickness (cm) 09/17/2023 0.8  cm Final  . Tricuspid Valve Regurgitation Grade 09/17/2023 mild   Final  . Tricuspid Valve Regurgitation Max * 09/17/2023 2.9  m/s Final  . Mitral Valve Regurgitation Grade 09/17/2023 moderate   Final  . Mitral Valve Stenosis Grade 09/17/2023 none   Final  . Aortic Valve Regurgitation Grade 09/17/2023 trivial   Final  . Aortic Valve Stenosis Grade 09/17/2023 moderate   Final  . Aortic Valve Stenosis Mean Gradien* 09/17/2023 17  mmHg Final  . Aortic Valve Max Velocity (m/s) 09/17/2023 2.6  m/s Final  No results displayed because visit has over 200 results.      ASSESSMENT  AND PLAN:  Diagnoses and all orders for this visit:  Edema of both legs Assessment & Plan: Stable on torsemide    Subclinical hypothyroidism Assessment & Plan: Has been subclinical    Prediabetes Assessment & Plan: Glucose is followed and controlled on diet    Low serum vitamin B12 Assessment & Plan: Replaced and followed    Abnormal echocardiogram -     Ambulatory Referral to Cardiology  Ulcer of left foot, unspecified ulcer stage (CMS/HHS-HCC) -     Ambulatory Referral to Vascular Surgery

## 2023-12-02 ENCOUNTER — Other Ambulatory Visit (INDEPENDENT_AMBULATORY_CARE_PROVIDER_SITE_OTHER): Payer: Self-pay | Admitting: Vascular Surgery

## 2023-12-02 DIAGNOSIS — L97529 Non-pressure chronic ulcer of other part of left foot with unspecified severity: Secondary | ICD-10-CM

## 2023-12-03 ENCOUNTER — Encounter: Payer: Self-pay | Admitting: Infectious Diseases

## 2023-12-03 ENCOUNTER — Other Ambulatory Visit
Admission: RE | Admit: 2023-12-03 | Discharge: 2023-12-03 | Disposition: A | Discharge status: Home / Self Care | Source: Ambulatory Visit | Attending: Infectious Diseases | Admitting: Infectious Diseases

## 2023-12-03 ENCOUNTER — Other Ambulatory Visit: Payer: Self-pay | Admitting: Infectious Diseases

## 2023-12-03 ENCOUNTER — Ambulatory Visit: Attending: Infectious Diseases | Admitting: Infectious Diseases

## 2023-12-03 VITALS — BP 104/58 | HR 77 | Temp 96.8°F | Ht 65.0 in | Wt 191.0 lb

## 2023-12-03 DIAGNOSIS — A318 Other mycobacterial infections: Secondary | ICD-10-CM | POA: Diagnosis not present

## 2023-12-03 DIAGNOSIS — Z853 Personal history of malignant neoplasm of breast: Secondary | ICD-10-CM | POA: Diagnosis not present

## 2023-12-03 DIAGNOSIS — G500 Trigeminal neuralgia: Secondary | ICD-10-CM | POA: Diagnosis not present

## 2023-12-03 DIAGNOSIS — A311 Cutaneous mycobacterial infection: Secondary | ICD-10-CM | POA: Insufficient documentation

## 2023-12-03 DIAGNOSIS — B0222 Postherpetic trigeminal neuralgia: Secondary | ICD-10-CM | POA: Insufficient documentation

## 2023-12-03 DIAGNOSIS — H209 Unspecified iridocyclitis: Secondary | ICD-10-CM | POA: Diagnosis not present

## 2023-12-03 DIAGNOSIS — L0889 Other specified local infections of the skin and subcutaneous tissue: Secondary | ICD-10-CM | POA: Diagnosis not present

## 2023-12-03 DIAGNOSIS — Z923 Personal history of irradiation: Secondary | ICD-10-CM | POA: Diagnosis not present

## 2023-12-03 DIAGNOSIS — C9110 Chronic lymphocytic leukemia of B-cell type not having achieved remission: Secondary | ICD-10-CM | POA: Insufficient documentation

## 2023-12-03 MED ORDER — AZITHROMYCIN 500 MG PO TABS: 500.0000 mg | ORAL_TABLET | Freq: Every day | ORAL | 1 refills | Status: AC

## 2023-12-03 MED ORDER — LINEZOLID 600 MG PO TABS: 600.0000 mg | ORAL_TABLET | Freq: Every day | ORAL | 1 refills | Status: DC

## 2023-12-03 NOTE — Progress Notes (Signed)
 NAME: Joanna Phillips  DOB: 1950/10/15  MRN: [id number]  Date/Time: 12/03/2023 9:22 AM   Subjective:  Follow up visit here with her husband Joanna Phillips is a 73 year old female with chronic lymphocytic leukemia, rt ca breast DCIS s/p lumpectomy , mycobacterium chelonae infection of the foot, trigeminal neuralgia  Last seen me on  10/15/23 Saw Dr.Fitzgerald 6/9 and 7/1 while I was away  History as follows Jan-Feb 2025- start of a wound on the left foot 08/14/23 Surgical  I/D 09/01/23 referred to me for mycobacterium chelonae and Nocardia- was on bactrim  Doxy and azithromycin   09/02/23 - 09/11/23 hospitalized  [facility] with fever, chills No cause identified ( I thought it could be bactrim  related and discussed with [facility] Doc who did not think that was the case  Called [facility] no nocardia Susceptibility of Mycobacterium chelonae reported on 09/23/23-  First week May 2025 sudden onset blurry vision- saw [facility] Uveitis questioned - sent to [facility]  Also had some worsening hearing loss over baseline hearing issue post shingles- saw ENT   09/24/23 - I saw patient had severe abdominal pain , vomiting, weight loss DC all antibiotics ( at that time was on azithromycin , doxy and bactrim )  09/28/23- saw [facility] opthal B/l anterior uveitis and they questioned Mycobacterium chelonae  10/01/23- All GI symptoms resolved  restarted azithromycin  Added linezolid   10/15/23 saw patient and she was improving and tolerating meds  6/9 and 7/1- saw Dr.Fitzgerald- labs done and okay  Pt says her eye sight is much better . Hearing is better  She is walking with a cane' overall doing better  The wound on the left foot is samller but has a black color and she is referred to vascular    Medical history  The left foot infection began before February 2025 with a small bump that progressively enlarged. Surgical intervention was performed on August 14, 2023, to I/D and tissue biopsy and culture by Dr.Fowler.  Post-surgery, she received home health care for wound dressing three times a week. . She completed a course of Ciprofloxacin  and Doxycycline , which initially seemed to help, but new lesions  appeared. 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 She takes gabapentin  800 mg three times a day and oxcarbazepine  three times a day for pain management. She also takes omeprazole  20 mg daily.  Her past medical history includes chronic lymphocytic leukemia diagnosed in 2011 or 2012, currently under observation without active treatment. She also had right-sided breast cancer in 2018, treated with surgery and radiation, but no chemotherapy. No history of diabetes, hypertension, or heart issues.   She has not engaged in yard work or been scratched by pets. She had a pedicure earlier this year, performed by her licensed niece, which involved soaking her feet in a whirlpool.  Past Medical History:  Diagnosis Date   Arthritis    Breast cancer (HCC) 2017   DCIS   CLL (chronic lymphocytic leukemia) (HCC) 2011   DCIS (ductal carcinoma in situ) 03/11/2016   GERD (gastroesophageal reflux disease)    Hyponatremia    Personal history of radiation therapy 2017    Past Surgical History:  Procedure Laterality Date   BREAST BIOPSY Right 02/12/2016   DCIS   BREAST BIOPSY Right 12/27/2019   path pending, calcs,  x marker   BREAST LUMPECTOMY Right 03/11/2016   DCIS neg margins   CHOLECYSTECTOMY  1996   COLONOSCOPY  2013   COLONOSCOPY WITH PROPOFOL  N/A 04/19/2021   Procedure: COLONOSCOPY WITH PROPOFOL ;  Surgeon: Onita Elspeth Sharper, DO;  Location: [facility] ENDOSCOPY;  Service: Gastroenterology;  Laterality: N/A;   IRRIGATION AND DEBRIDEMENT FOOT Left 08/14/2023   Procedure: IRRIGATION AND DEBRIDEMENT FOOT;  Surgeon: Ashley Soulier, DPM;   Location: [facility] ORS;  Service: Orthopedics/Podiatry;  Laterality: Left;   squamous cell skin on scalp surgery Right     Social History   Socioeconomic History   Marital status: Married    Spouse name: Not on file   Number of children: Not on file   Years of education: Not on file   Highest education level: Not on file  Occupational History   Occupation: retired   Tobacco Use   Smoking status: Never   Smokeless tobacco: Never  Vaping Use   Vaping status: Never Used  Substance and Sexual Activity   Alcohol  use: No   Drug use: No   Sexual activity: Not on file  Other Topics Concern   Not on file  Social History Narrative   Not on file   Social Drivers of Health   Financial Resource Strain: Low Risk  (10/20/2023)   Received from [facility]   Overall Financial Resource Strain (CARDIA)    Difficulty of Paying Living Expenses: Not hard at all  Food Insecurity: No Food Insecurity (10/20/2023)   Received from [facility]   Hunger Vital Sign    Within the past 12 months, you worried that your food would run out before you got the money to buy more.: Never true    Within the past 12 months, the food you bought just didn't last and you didn't have money to get more.: Never true  Transportation Needs: No Transportation Needs (10/20/2023)   Received from [facility] - Transportation    In the past 12 months, has lack of transportation kept you from medical appointments or from getting medications?: No    Lack of Transportation (Non-Medical): No  Physical Activity: Insufficiently Active (01/11/2019)   Exercise Vital Sign    Days of Exercise per Week: 3 days    Minutes of Exercise per Session: 30 min  Stress: No Stress Concern Present (01/11/2019)   [employer] of Occupational Health - Occupational Stress Questionnaire    Feeling of Stress : Not at all  Social Connections: Moderately Integrated (08/13/2023)   Social  Connection and Isolation Panel    Frequency of Communication with Friends and Family: More than three times a week    Frequency of Social Gatherings with Friends and Family: More than three times a week    Attends Religious Services: More than 4 times per year    Active Member of [employer] or Organizations: No    Attends Banker Meetings: Never    Marital Status: Married  Catering manager Violence: Not At Risk (08/13/2023)   Humiliation, Afraid, Rape, and Kick questionnaire    Fear of Current or Ex-Partner: No    Emotionally Abused: No    Physically Abused: No    Sexually Abused: No    Family History  Problem Relation Age of Onset   Cancer Father        lung   Diabetes Mother    Breast cancer Maternal Aunt 64   Breast cancer Maternal Aunt 65  Allergies  Allergen Reactions   Amoxicillin Hives   Hydrocodone Itching   Sulfamethoxazole -Trimethoprim  Nausea Only   Allopurinol  Rash   I? Current Outpatient Medications  Medication Sig Dispense Refill   acetaminophen  (TYLENOL ) 650 MG CR tablet Take 1 tablet (650 mg total) by mouth every 8 (eight) hours as needed for pain.     atropine 1 % ophthalmic solution Place 1 drop into both eyes 2 (two) times daily     azithromycin  (ZITHROMAX ) 500 MG tablet Take 1 tablet (500 mg total) by mouth daily. 30 tablet 1   calcium -vitamin D (OSCAL WITH D) 500-200 MG-UNIT TABS tablet Take 1 tablet by mouth daily.     Carboxymethylcellulose Sodium (REFRESH CELLUVISC OP) Apply 1 drop to eye every 4 (four) hours as needed.     Difluprednate 0.05 % EMUL Place 1 drop into both eyes 6 (six) times daily     fluticasone  (FLONASE ) 50 MCG/ACT nasal spray Place into the nose.     gabapentin  (NEURONTIN ) 800 MG tablet Take 1 tablet (800 mg total) by mouth 3 (three) times daily. Home med. 270 tablet 1   linezolid  (ZYVOX ) 600 MG tablet Take 1 tablet (600 mg total) by mouth daily. 30 tablet 1   Multiple Vitamin (MULTIVITAMIN WITH MINERALS) TABS tablet Take 1  tablet by mouth daily.     omeprazole  (PRILOSEC) 20 MG capsule Take 1 capsule by mouth once daily 90 capsule 3   OXcarbazepine  (TRILEPTAL ) 150 MG tablet Take 300 mg by mouth 2 (two) times daily.     potassium chloride (KLOR-CON) 10 MEQ tablet Take 10 mEq by mouth daily.     torsemide (DEMADEX) 20 MG tablet Take 20 mg by mouth daily.     vitamin B-12 (CYANOCOBALAMIN ) 250 MCG tablet Take 250 mcg by mouth daily.     No current facility-administered medications for this visit.     Abtx:  Anti-infectives (From admission, onward)    None       REVIEW OF SYSTEMS:  Const: negative fever, negative chills, negative weight loss Eyes: negative diplopia or visual changes, negative eye pain ENT: negative coryza, negative sore throat Resp: negative cough, hemoptysis, dyspnea Cards: negative for chest pain, palpitations, lower extremity edema GU: negative for frequency, dysuria and hematuria GI:  no  abdominal pain, no nausea vomiting, appetitie great Skin: negative for rash and pruritus Heme: negative for easy bruising and gum/nose bleeding MS: more energy and is able to walk a little Neurolo:no headaches Psych: negative for feelings of anxiety, depression  Endocrine: negative for thyroid, diabetes Allergy/Immunology- Amoxicillin HIVEs Sulfa   fever, vomiting  Objective:  VITALS:  BP (!) 104/58   Pulse 77   Temp (!) 96.8 F (36 C) (Temporal)   Ht 5' 5 (1.651 m)   Wt 191 lb (86.6 kg)   BMI 31.78 kg/m   PHYSICAL EXAM:  General: Alert, cooperative, no distress, appears stated age.  Head: Normocephalic, without obvious abnormality, atraumatic. Eyes: Conjunctivae clear, anicteric sclerae. Pupils are equal ENT Nares normal. No drainage or sinus tenderness. Lips, mucosa, normal. No Thrush Neck: Supple, symmetrical, no adenopathy, thyroid: non tender no carotid bruit and no JVD.  Lungs: Clear to auscultation bilaterally. No Wheezing or Rhonchi. No rales. Heart: Regular rate and  rhythm, no murmur, rub or gallop. Abdomen: Soft,minimal tenderness - Bowel sounds N Extremities: b/l venous edema  12/03/23   I cleaned the black pigmentation        10/15/23     Left > rt Left foot near  the 3/4 th toe- ulcerated wound on the dorsum, much better    4/15     Skin: No rashes or lesions. Or bruising Lymph: Cervical, supraclavicular normal. Neurologic: tongue paralysis rt side   Microbiology: 08/14/23 Microbiology of wound mycobacterium chelonae Confirmed Nocardia not there and it has been corrected     IMAGING RESULTS: MRI of left foot- 08/13/23  Mild subcutaneous fat edema and swelling throughout the dorsolateral midfoot, greatest at the level of the metatarsal shafts. No walled-off fluid collection to indicate an abscess. 2. No evidence of acute osteomyelitis.   I have personally reviewed the films ? Impression/Recommendation ?Mycobacterium chelonae f Left foot infection involving soft tissue- no bones involved  - No nocardia- [facility] confirmed it   surgical debridement on August 14, 2023. MRI from March shows no bone involvemen Pt was on doxy, azithromycin  and bactrim ( this was given because Nocardia was reported previously) Been on appropriate antibiotics since 10/01/23   Treatment requires a 3-4 month antibiotic regimen.   Bactrim  severe GI symptoms and likely caused fever    Vision impairment-  anterior uveitis ( there is a concern that it could be due to chelonae - I dount it is due to chelona- wonder whether it was bactrim -  Blood culture for AFB sent on 5/15- negative  Chronic lymphocytic leukemia (CLL)   Stage 1 CLL is under observation with no active treatment. R]Followed by Dr.Rao- recent immunoglobulin level shows normal IgG but low IgM and IgA.  Discussed with Dr.Rao- may need to repeat and give IVIG if needed  Postherpetic neuralgia  /trigeminal neuralgia In feb had radifrequecy ablation of rt trigeminal nerve- on  oxcarbazepine .(Trileptal ) and gabapentin   Persistent pain post-shingles infection in 2017 affects the right side of the head and neck. Continue management with gabapentin  and   Breast cancer (right side, in situ)   Right-sided breast cancer was treated with surgery and radiation in 2018. No chemotherapy was administered. ? ? Follow up 3 week Labs on 7/31    Discussed in detail with patient and her husband  She will see Dr.Fitzgerald while I am away in June I will see her in July  ________________________________________________

## 2023-12-03 NOTE — Patient Instructions (Signed)
 You are here for follow up of the left foot infection- you are ona zithromycin and linezolid - today we cleaned the wound- sent cultures You can do labs on July 31st.

## 2023-12-05 NOTE — Progress Notes (Unsigned)
 MRN : [id number]  Joanna Phillips is a 73 y.o. (19-Apr-1951) female who presents with chief complaint of legs swell.  History of Present Illness:   The patient returns to the office for followup evaluation regarding leg swelling associated with a resolving hematoma.  She sustained a hematoma back in September.  Her concern regarding her evaluation today is that it has not completely resolved and is still hard and firm area in the medial calf which is mildly tender particularly with prolonged standing.  Overall the swelling/lymphedema has has been stable and the pain associated with swelling has decreased. There have not been any interval development of a ulcerations or wounds.   Since the previous visit the patient has been wearing graduated compression stockings and has noted some improvement in the lymphedema. The patient has been using compression routinely morning until night.   The patient also states elevation during the day and exercise (such as walking) is being done too.   No outpatient medications have been marked as taking for the 12/07/23 encounter (Appointment) with Jama, Cordella MATSU, MD.    Past Medical History:  Diagnosis Date   Arthritis    Breast cancer (HCC) 2017   DCIS   CLL (chronic lymphocytic leukemia) (HCC) 2011   DCIS (ductal carcinoma in situ) 03/11/2016   GERD (gastroesophageal reflux disease)    Hyponatremia    Personal history of radiation therapy 2017    Past Surgical History:  Procedure Laterality Date   BREAST BIOPSY Right 02/12/2016   DCIS   BREAST BIOPSY Right 12/27/2019   path pending, calcs, x marker   BREAST LUMPECTOMY Right 03/11/2016   DCIS neg margins   CHOLECYSTECTOMY  1996   COLONOSCOPY  2013   COLONOSCOPY WITH PROPOFOL  N/A 04/19/2021   Procedure: COLONOSCOPY WITH PROPOFOL ;  Surgeon: Onita Elspeth Sharper, DO;  Location: [facility] ENDOSCOPY;  Service: Gastroenterology;  Laterality: N/A;   IRRIGATION AND  DEBRIDEMENT FOOT Left 08/14/2023   Procedure: IRRIGATION AND DEBRIDEMENT FOOT;  Surgeon: Ashley Soulier, DPM;  Location: [facility] ORS;  Service: Orthopedics/Podiatry;  Laterality: Left;   squamous cell skin on scalp surgery Right     Social History Social History   Tobacco Use   Smoking status: Never   Smokeless tobacco: Never  Vaping Use   Vaping status: Never Used  Substance Use Topics   Alcohol  use: No   Drug use: No    Family History Family History  Problem Relation Age of Onset   Cancer Father        lung   Diabetes Mother    Breast cancer Maternal Aunt 60   Breast cancer Maternal Aunt 98    Allergies  Allergen Reactions   Amoxicillin Hives   Hydrocodone Itching   Sulfamethoxazole -Trimethoprim  Nausea Only   Allopurinol  Rash     REVIEW OF SYSTEMS (Negative unless checked)  Constitutional: [] Weight loss  [] Fever  [] Chills Cardiac: [] Chest pain   [] Chest pressure   [] Palpitations   [] Shortness of breath when laying flat   [] Shortness of breath with exertion. Vascular:  [] Pain in legs with walking   [x] Pain in legs with standing  [] History of DVT   [] Phlebitis   [x] Swelling in legs   [] Varicose veins   [] Non-healing ulcers Pulmonary:   [] Uses home oxygen   [] Productive cough   [] Hemoptysis   [] Wheeze  []   COPD   [] Asthma Neurologic:  [] Dizziness   [] Seizures   [] History of stroke   [] History of TIA  [] Aphasia   [] Vissual changes   [] Weakness or numbness in arm   [] Weakness or numbness in leg Musculoskeletal:   [] Joint swelling   [] Joint pain   [] Low back pain Hematologic:  [] Easy bruising  [] Easy bleeding   [] Hypercoagulable state   [] Anemic Gastrointestinal:  [] Diarrhea   [] Vomiting  [] Gastroesophageal reflux/heartburn   [] Difficulty swallowing. Genitourinary:  [] Chronic kidney disease   [] Difficult urination  [] Frequent urination   [] Blood in urine Skin:  [] Rashes   [] Ulcers  Psychological:  [] History of anxiety   []  History of major depression.  Physical  Examination  There were no vitals filed for this visit. There is no height or weight on file to calculate BMI. Gen: WD/WN, NAD Head: [place]/AT, No temporalis wasting.  Ear/Nose/Throat: Hearing grossly intact, nares w/o erythema or drainage, pinna without lesions Eyes: PER, EOMI, sclera nonicteric.  Neck: Supple, no gross masses.  No JVD.  Pulmonary:  Good air movement, no audible wheezing, no use of accessory muscles.  Cardiac: RRR, precordium not hyperdynamic. Vascular:  scattered varicosities present bilaterally.  Mild venous stasis changes to the legs bilaterally.  3-4+ soft pitting edema, CEAP [id number]  Vessel Right Left  Radial Palpable Palpable  Gastrointestinal: soft, non-distended. No guarding/no peritoneal signs.  Musculoskeletal: M/S 5/5 throughout.  No deformity.  Neurologic: CN 2-12 intact. Pain and light touch intact in extremities.  Symmetrical.  Speech is fluent. Motor exam as listed above. Psychiatric: Judgment intact, Mood & affect appropriate for pt's clinical situation. Dermatologic: Venous rashes no ulcers noted.  No changes consistent with cellulitis. Lymph : No lichenification or skin changes of chronic lymphedema.  CBC Lab Results  Component Value Date   WBC 7.3 10/15/2023   HGB 10.6 (L) 10/15/2023   HCT 34.2 (L) 10/15/2023   MCV 85.1 10/15/2023   PLT 240 10/15/2023    BMET    Component Value Date/Time   NA 135 10/15/2023 0948   NA 141 02/13/2016 1049   K 4.6 10/15/2023 0948   CL 101 10/15/2023 0948   CO2 25 10/15/2023 0948   GLUCOSE 182 (H) 10/15/2023 0948   BUN 14 10/15/2023 0948   BUN 18 02/13/2016 1049   CREATININE 0.65 10/15/2023 0948   CALCIUM  9.0 10/15/2023 0948   GFRNONAA >60 10/15/2023 0948   GFRAA >60 02/06/2020 0957   CrCl cannot be calculated (Patient's most recent lab result is older than the maximum 21 days allowed.).  COAG Lab Results  Component Value Date   INR 1.3 (H) 02/25/2022    Radiology No results  found.   Assessment/Plan    Cordella Shawl, MD  12/05/2023 3:43 PM

## 2023-12-06 LAB — AEROBIC CULTURE W GRAM STAIN (SUPERFICIAL SPECIMEN): Gram Stain: NONE SEEN

## 2023-12-07 ENCOUNTER — Ambulatory Visit (INDEPENDENT_AMBULATORY_CARE_PROVIDER_SITE_OTHER)

## 2023-12-07 ENCOUNTER — Ambulatory Visit (INDEPENDENT_AMBULATORY_CARE_PROVIDER_SITE_OTHER): Admitting: Vascular Surgery

## 2023-12-07 ENCOUNTER — Encounter (INDEPENDENT_AMBULATORY_CARE_PROVIDER_SITE_OTHER): Payer: Self-pay | Admitting: Vascular Surgery

## 2023-12-07 VITALS — BP 146/83 | HR 65 | Ht 65.0 in | Wt 192.2 lb

## 2023-12-07 DIAGNOSIS — I890 Lymphedema, not elsewhere classified: Secondary | ICD-10-CM

## 2023-12-07 DIAGNOSIS — I872 Venous insufficiency (chronic) (peripheral): Secondary | ICD-10-CM

## 2023-12-07 DIAGNOSIS — L97522 Non-pressure chronic ulcer of other part of left foot with fat layer exposed: Secondary | ICD-10-CM

## 2023-12-07 DIAGNOSIS — K219 Gastro-esophageal reflux disease without esophagitis: Secondary | ICD-10-CM | POA: Diagnosis not present

## 2023-12-07 DIAGNOSIS — L97529 Non-pressure chronic ulcer of other part of left foot with unspecified severity: Secondary | ICD-10-CM

## 2023-12-09 ENCOUNTER — Encounter (INDEPENDENT_AMBULATORY_CARE_PROVIDER_SITE_OTHER): Payer: Self-pay | Admitting: Vascular Surgery

## 2023-12-09 DIAGNOSIS — L97529 Non-pressure chronic ulcer of other part of left foot with unspecified severity: Secondary | ICD-10-CM | POA: Insufficient documentation

## 2023-12-15 ENCOUNTER — Ambulatory Visit: Payer: Self-pay

## 2023-12-22 ENCOUNTER — Encounter: Payer: Self-pay | Admitting: Infectious Diseases

## 2023-12-22 ENCOUNTER — Ambulatory Visit: Attending: Infectious Diseases | Admitting: Infectious Diseases

## 2023-12-22 VITALS — BP 113/58 | HR 75 | Temp 97.3°F

## 2023-12-22 DIAGNOSIS — L089 Local infection of the skin and subcutaneous tissue, unspecified: Secondary | ICD-10-CM | POA: Diagnosis not present

## 2023-12-22 DIAGNOSIS — B0229 Other postherpetic nervous system involvement: Secondary | ICD-10-CM | POA: Insufficient documentation

## 2023-12-22 DIAGNOSIS — T8149XA Infection following a procedure, other surgical site, initial encounter: Secondary | ICD-10-CM | POA: Insufficient documentation

## 2023-12-22 DIAGNOSIS — A318 Other mycobacterial infections: Secondary | ICD-10-CM | POA: Diagnosis not present

## 2023-12-22 DIAGNOSIS — Z853 Personal history of malignant neoplasm of breast: Secondary | ICD-10-CM | POA: Insufficient documentation

## 2023-12-22 DIAGNOSIS — C91Z0 Other lymphoid leukemia not having achieved remission: Secondary | ICD-10-CM

## 2023-12-22 DIAGNOSIS — C9110 Chronic lymphocytic leukemia of B-cell type not having achieved remission: Secondary | ICD-10-CM | POA: Insufficient documentation

## 2023-12-22 DIAGNOSIS — B0222 Postherpetic trigeminal neuralgia: Secondary | ICD-10-CM

## 2023-12-22 DIAGNOSIS — G500 Trigeminal neuralgia: Secondary | ICD-10-CM | POA: Insufficient documentation

## 2023-12-22 DIAGNOSIS — B0223 Postherpetic polyneuropathy: Secondary | ICD-10-CM

## 2023-12-22 DIAGNOSIS — Z923 Personal history of irradiation: Secondary | ICD-10-CM | POA: Insufficient documentation

## 2023-12-22 DIAGNOSIS — A311 Cutaneous mycobacterial infection: Secondary | ICD-10-CM | POA: Diagnosis not present

## 2023-12-22 DIAGNOSIS — Z79899 Other long term (current) drug therapy: Secondary | ICD-10-CM | POA: Insufficient documentation

## 2023-12-22 DIAGNOSIS — H209 Unspecified iridocyclitis: Secondary | ICD-10-CM | POA: Diagnosis not present

## 2023-12-22 NOTE — Progress Notes (Signed)
 NAME: Joanna Phillips  DOB: 10/09/1950  MRN: [id number]  Date/Time: 12/22/2023 9:29 AM   Subjective:  Follow up visit here with her husband Joanna Phillips is a 73 year old female with chronic lymphocytic leukemia, rt ca breast DCIS s/p lumpectomy , mycobacterium chelonae infection of the foot, trigeminal neuralgia   History as follows Jan-Feb 2025- start of a wound on the left foot 08/14/23 Surgical  I/D 09/01/23 referred to me for mycobacterium chelonae and Nocardia- was on bactrim  Doxy and azithromycin   09/02/23 - 09/11/23 hospitalized  [facility] with fever, chills No cause identified ( I thought it could be bactrim  related and discussed with [facility] Doc who did not think that was the case  Called [facility] no nocardia Susceptibility of Mycobacterium chelonae reported on 09/23/23-  First week May 2025 sudden onset blurry vision- saw [facility] Uveitis questioned - sent to [facility] opthal  Also had some worsening hearing loss over baseline hearing issue post shingles- saw ENT   09/24/23 - I saw patient had severe abdominal pain , vomiting, weight loss DC all antibiotics ( at that time was on azithromycin , doxy and bactrim )  09/28/23- saw [facility] opthal B/l anterior uveitis and they questioned Mycobacterium chelonae  10/01/23- All GI symptoms resolved  restarted azithromycin  Added linezolid   10/15/23 saw patient and she was improving and tolerating meds  6/9 and 7/1- saw Joanna Phillips- labs done and okay  12/03/23 in my clinic Pt says her eye sight is much better . Hearing is better  She is walking with a cane' overall doing better  The wound on the left foot is samller but has a black color and I removed that and took a culture Staph cohnii- asked her to do BID linezolid  for a week  8/5- wound still the same but no black slough   Medical history  The left foot infection began before February 2025 with a small bump that progressively enlarged. Surgical intervention was performed on August 14, 2023,  to I/D and tissue biopsy and culture by Dr.Fowler. Post-surgery, she received home health care for wound dressing three times a week. . She completed a course of Ciprofloxacin  and Doxycycline , which initially seemed to help, but new lesions  appeared. 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 She takes gabapentin  800 mg three times a day and oxcarbazepine  three times a day for pain management. She also takes omeprazole  20 mg daily.  Her past medical history includes chronic lymphocytic leukemia diagnosed in 2011 or 2012, currently under observation without active treatment. She also had right-sided breast cancer in 2018, treated with surgery and radiation, but no chemotherapy. No history of diabetes, hypertension, or heart issues.   She has not engaged in yard work or been scratched by pets. She had a pedicure earlier this year, performed by her licensed niece, which involved soaking her feet in a whirlpool.  Past Medical History:  Diagnosis Date   Arthritis    Breast cancer (HCC) 2017   DCIS   CLL (chronic lymphocytic leukemia) (HCC) 2011   DCIS (ductal carcinoma in situ) 03/11/2016   GERD (gastroesophageal reflux disease)    Hyponatremia    Personal history of radiation therapy 2017    Past Surgical History:  Procedure Laterality Date   BREAST BIOPSY Right 02/12/2016   DCIS  BREAST BIOPSY Right 12/27/2019   path pending, calcs, x marker   BREAST LUMPECTOMY Right 03/11/2016   DCIS neg margins   CHOLECYSTECTOMY  1996   COLONOSCOPY  2013   COLONOSCOPY WITH PROPOFOL  N/A 04/19/2021   Procedure: COLONOSCOPY WITH PROPOFOL ;  Surgeon: Joanna Elspeth Sharper, DO;  Location: [facility];  Service: Gastroenterology;  Laterality: N/A;   IRRIGATION AND DEBRIDEMENT FOOT Left 08/14/2023   Procedure: IRRIGATION AND  DEBRIDEMENT FOOT;  Surgeon: Joanna Phillips, DPM;  Location: [facility] ORS;  Service: Orthopedics/Podiatry;  Laterality: Left;   squamous cell skin on scalp surgery Right     Social History   Socioeconomic History   Marital status: Married    Spouse name: Not on file   Number of children: Not on file   Years of education: Not on file   Highest education level: Not on file  Occupational History   Occupation: retired   Tobacco Use   Smoking status: Never   Smokeless tobacco: Never  Vaping Use   Vaping status: Never Used  Substance and Sexual Activity   Alcohol  use: No   Drug use: No   Sexual activity: Not on file  Other Topics Concern   Not on file  Social History Narrative   Not on file   Social Drivers of Health   Financial Resource Strain: Low Risk  (10/20/2023)   Received from [facility]   Overall Financial Resource Strain (CARDIA)    Difficulty of Paying Living Expenses: Not hard at all  Food Insecurity: No Food Insecurity (10/20/2023)   Received from [facility]   Hunger Vital Sign    Within the past 12 months, you worried that your food would run out before you got the money to buy more.: Never true    Within the past 12 months, the food you bought just didn't last and you didn't have money to get more.: Never true  Transportation Needs: No Transportation Needs (10/20/2023)   Received from [facility] - Transportation    In the past 12 months, has lack of transportation kept you from medical appointments or from getting medications?: No    Lack of Transportation (Non-Medical): No  Physical Activity: Insufficiently Active (01/11/2019)   Exercise Vital Sign    Days of Exercise per Week: 3 days    Minutes of Exercise per Session: 30 min  Stress: No Stress Concern Present (01/11/2019)   [facility] [employer] - Occupational Stress Questionnaire    Feeling of Stress : Not at all  Social Connections:  Moderately Integrated (08/13/2023)   Social Connection and Isolation Panel    Frequency of Communication with Friends and Family: More than three times a week    Frequency of Social Gatherings with Friends and Family: More than three times a week    Attends Religious Services: More than 4 times per year    Active Member of [employer] or Organizations: No    Attends Banker Meetings: Never    Marital Status: Married  Catering manager Violence: Not At Risk (08/13/2023)   Humiliation, Afraid, Rape, and Kick questionnaire    Fear of Current or Ex-Partner: No    Emotionally Abused: No    Physically Abused: No    Sexually Abused: No    Family History  Problem Relation Age of Onset   Cancer Father        lung   Diabetes Mother    Breast cancer Maternal  Aunt 60   Breast cancer Maternal Aunt 70   Allergies  Allergen Reactions   Amoxicillin Hives   Hydrocodone Itching   Sulfamethoxazole -Trimethoprim  Nausea Only   Allopurinol  Rash   I? Current Outpatient Medications  Medication Sig Dispense Refill   acetaminophen  (TYLENOL ) 650 MG CR tablet Take 1 tablet (650 mg total) by mouth every 8 (eight) hours as needed for pain.     atropine 1 % ophthalmic solution Place 1 drop into both eyes 2 (two) times daily     azithromycin  (ZITHROMAX ) 500 MG tablet Take 1 tablet (500 mg total) by mouth daily. 30 tablet 1   calcium -vitamin D (OSCAL WITH D) 500-200 MG-UNIT TABS tablet Take 1 tablet by mouth daily.     Carboxymethylcellulose Sodium (REFRESH CELLUVISC OP) Apply 1 drop to eye every 4 (four) hours as needed.     Difluprednate 0.05 % EMUL Place 1 drop into both eyes 6 (six) times daily     fluticasone  (FLONASE ) 50 MCG/ACT nasal spray Place into the nose.     gabapentin  (NEURONTIN ) 800 MG tablet Take 1 tablet (800 mg total) by mouth 3 (three) times daily. Home med. 270 tablet 1   linezolid  (ZYVOX ) 600 MG tablet Take 1 tablet (600 mg total) by mouth daily. 30 tablet 1   Multiple Vitamin  (MULTIVITAMIN WITH MINERALS) TABS tablet Take 1 tablet by mouth daily.     omeprazole  (PRILOSEC) 20 MG capsule Take 1 capsule by mouth once daily 90 capsule 3   OXcarbazepine  (TRILEPTAL ) 150 MG tablet Take 300 mg by mouth 2 (two) times daily.     potassium chloride (KLOR-CON) 10 MEQ tablet Take 10 mEq by mouth daily.     torsemide (DEMADEX) 20 MG tablet Take 20 mg by mouth daily.     vitamin B-12 (CYANOCOBALAMIN ) 250 MCG tablet Take 250 mcg by mouth daily.     No current facility-administered medications for this visit.     Abtx:  Anti-infectives (From admission, onward)    None       REVIEW OF SYSTEMS:  Const: negative fever, negative chills, negative weight loss Eyes: negative diplopia or visual changes, negative eye pain ENT: negative coryza, negative sore throat Resp: negative cough, hemoptysis, dyspnea Cards: negative for chest pain, palpitations, lower extremity edema GU: negative for frequency, dysuria and hematuria GI:  no  abdominal pain, no nausea vomiting, appetitie great Skin: negative for rash and pruritus Heme: negative for easy bruising and gum/nose bleeding MS: more energy and is able to walk a little Neurolo:no headaches Psych: negative for feelings of anxiety, depression  Endocrine: negative for thyroid, diabetes Allergy/Immunology- Amoxicillin HIVEs Sulfa   fever, vomiting  Objective:  VITALS:  BP (!) 113/58   Pulse 75   Temp (!) 97.3 F (36.3 C) (Temporal)   SpO2 98%   PHYSICAL EXAM:  General: Alert, cooperative, no distress, appears stated age.  Head: Normocephalic, without obvious abnormality, atraumatic. Eyes: Conjunctivae clear, anicteric sclerae. Pupils are equal ENT Nares normal. No drainage or sinus tenderness. Lips, mucosa, normal. No Thrush Neck: Supple, symmetrical, no adenopathy, thyroid: non tender no carotid bruit and no JVD.  Lungs: Clear to auscultation bilaterally. No Wheezing or Rhonchi. No rales. Heart: Regular rate and  rhythm, no murmur, rub or gallop. Abdomen: Soft,minimal tenderness - Bowel sounds N Extremities: b/l venous edema  12/22/23   12/03/23   I cleaned the black pigmentation        10/15/23     Left > rt Left foot near the  3/4 th toe- ulcerated wound on the dorsum, much better    4/15     Skin: No rashes or lesions. Or bruising Lymph: Cervical, supraclavicular normal. Neurologic: tongue paralysis rt side   Microbiology: 08/14/23 Microbiology of wound mycobacterium chelonae Confirmed Nocardia not there and it has been corrected     IMAGING RESULTS: MRI of left foot- 08/13/23  Mild subcutaneous fat edema and swelling throughout the dorsolateral midfoot, greatest at the level of the metatarsal shafts. No walled-off fluid collection to indicate an abscess. 2. No evidence of acute osteomyelitis.   I have personally reviewed the films ? Impression/Recommendation ?Mycobacterium chelonae f Left foot infection involving soft tissue- no bones involved  - No nocardia- [facility] confirmed it   surgical debridement on August 14, 2023. MRI from March shows no bone involvemen Pt was on doxy, azithromycin  and bactrim ( this was given because Nocardia was reported previously) Been on appropriate antibiotics since 10/01/23--Treatment requires a 3-4 month antibiotic regimen.  Not much of improvement- slow to heal- wonder whether she needs another antibiotic- clofazamine or omadacycline ( the latter can caus severe gI symptoms which she had before with bactrim  and she does not want to go thru that again)   May ask Dr.Fowler to do another biopsy for culture and pathology  Bactrim  severe GI symptoms and likely caused fever    Vision impairment-  anterior uveitis ( there is a concern that it could be due to chelonae - I dount it is due to chelona- wonder whether it was bactrim -  Blood culture for AFB sent on 5/15- negative  Chronic lymphocytic leukemia (CLL)   Stage 1 CLL is under  observation with no active treatment. R]Followed by Dr.Rao- recent immunoglobulin level shows normal IgG but low IgM and IgA.  Discussed with Dr.Rao- whether IVIG is helpful- she will get back to me  Postherpetic neuralgia  /trigeminal neuralgia In feb had radifrequecy ablation of rt trigeminal nerve- on oxcarbazepine .(Trileptal ) and gabapentin   Persistent pain post-shingles infection in 2017 affects the right side of the head and neck. Continue management with gabapentin  and   Breast cancer (right side, in situ)   Right-sided breast cancer was treated with surgery and radiation in 2018. No chemotherapy was administered. ? ? Follow up 3 week Labs on 7/31    Discussed in detail with patient and her husband  Discussed with Dr.Fowler_ Follow up 1 month___________________________

## 2023-12-30 ENCOUNTER — Ambulatory Visit
Admission: RE | Admit: 2023-12-30 | Discharge: 2023-12-30 | Disposition: A | Discharge status: Home / Self Care | Source: Ambulatory Visit | Attending: Oncology | Admitting: Oncology

## 2023-12-30 DIAGNOSIS — C9110 Chronic lymphocytic leukemia of B-cell type not having achieved remission: Secondary | ICD-10-CM | POA: Insufficient documentation

## 2023-12-30 DIAGNOSIS — Z86000 Personal history of in-situ neoplasm of breast: Secondary | ICD-10-CM | POA: Diagnosis not present

## 2023-12-30 DIAGNOSIS — Z1231 Encounter for screening mammogram for malignant neoplasm of breast: Secondary | ICD-10-CM | POA: Insufficient documentation

## 2023-12-30 DIAGNOSIS — Z08 Encounter for follow-up examination after completed treatment for malignant neoplasm: Secondary | ICD-10-CM | POA: Diagnosis present

## 2024-01-04 ENCOUNTER — Ambulatory Visit: Admitting: Physician Assistant

## 2024-01-21 ENCOUNTER — Other Ambulatory Visit
Admission: RE | Admit: 2024-01-21 | Discharge: 2024-01-21 | Disposition: A | Discharge status: Home / Self Care | Source: Ambulatory Visit | Attending: Infectious Diseases | Admitting: Infectious Diseases

## 2024-01-21 ENCOUNTER — Encounter: Payer: Self-pay | Admitting: Infectious Diseases

## 2024-01-21 ENCOUNTER — Ambulatory Visit: Attending: Infectious Diseases | Admitting: Infectious Diseases

## 2024-01-21 VITALS — BP 109/67 | HR 71 | Temp 97.3°F

## 2024-01-21 DIAGNOSIS — H209 Unspecified iridocyclitis: Secondary | ICD-10-CM | POA: Insufficient documentation

## 2024-01-21 DIAGNOSIS — Z86000 Personal history of in-situ neoplasm of breast: Secondary | ICD-10-CM | POA: Insufficient documentation

## 2024-01-21 DIAGNOSIS — B957 Other staphylococcus as the cause of diseases classified elsewhere: Secondary | ICD-10-CM | POA: Insufficient documentation

## 2024-01-21 DIAGNOSIS — Z923 Personal history of irradiation: Secondary | ICD-10-CM | POA: Diagnosis not present

## 2024-01-21 DIAGNOSIS — A311 Cutaneous mycobacterial infection: Secondary | ICD-10-CM | POA: Insufficient documentation

## 2024-01-21 DIAGNOSIS — H539 Unspecified visual disturbance: Secondary | ICD-10-CM

## 2024-01-21 DIAGNOSIS — G500 Trigeminal neuralgia: Secondary | ICD-10-CM | POA: Diagnosis not present

## 2024-01-21 DIAGNOSIS — M542 Cervicalgia: Secondary | ICD-10-CM | POA: Insufficient documentation

## 2024-01-21 DIAGNOSIS — S91302A Unspecified open wound, left foot, initial encounter: Secondary | ICD-10-CM | POA: Insufficient documentation

## 2024-01-21 DIAGNOSIS — Z79899 Other long term (current) drug therapy: Secondary | ICD-10-CM | POA: Diagnosis not present

## 2024-01-21 DIAGNOSIS — B9689 Other specified bacterial agents as the cause of diseases classified elsewhere: Secondary | ICD-10-CM | POA: Insufficient documentation

## 2024-01-21 DIAGNOSIS — C9110 Chronic lymphocytic leukemia of B-cell type not having achieved remission: Secondary | ICD-10-CM | POA: Insufficient documentation

## 2024-01-21 DIAGNOSIS — B0222 Postherpetic trigeminal neuralgia: Secondary | ICD-10-CM

## 2024-01-21 DIAGNOSIS — L089 Local infection of the skin and subcutaneous tissue, unspecified: Secondary | ICD-10-CM | POA: Diagnosis not present

## 2024-01-21 DIAGNOSIS — B0229 Other postherpetic nervous system involvement: Secondary | ICD-10-CM | POA: Insufficient documentation

## 2024-01-21 DIAGNOSIS — X58XXXA Exposure to other specified factors, initial encounter: Secondary | ICD-10-CM | POA: Insufficient documentation

## 2024-01-21 LAB — COMPREHENSIVE METABOLIC PANEL WITH GFR
ALT: 15 U/L (ref 0–44)
AST: 22 U/L (ref 15–41)
Albumin: 4.8 g/dL (ref 3.5–5.0)
Alkaline Phosphatase: 79 U/L (ref 38–126)
Anion gap: 10 (ref 5–15)
BUN: 31 mg/dL — ABNORMAL HIGH (ref 8–23)
CO2: 23 mmol/L (ref 22–32)
Calcium: 9 mg/dL (ref 8.9–10.3)
Chloride: 101 mmol/L (ref 98–111)
Creatinine, Ser: 0.81 mg/dL (ref 0.44–1.00)
GFR, Estimated: >60 mL/min (ref >60)
Glucose, Bld: 111 mg/dL — ABNORMAL HIGH (ref 70–99)
Potassium: 4.4 mmol/L (ref 3.5–5.1)
Sodium: 134 mmol/L — ABNORMAL LOW (ref 135–145)
Total Bilirubin: 0.7 mg/dL (ref 0.0–1.2)
Total Protein: 7.5 g/dL (ref 6.5–8.1)

## 2024-01-21 LAB — CBC WITH DIFFERENTIAL/PLATELET
Abs Immature Granulocytes: 0 K/uL (ref 0.00–0.07)
Basophils Absolute: 0.1 K/uL (ref 0.0–0.1)
Basophils Relative: 1 %
Eosinophils Absolute: 0.2 K/uL (ref 0.0–0.5)
Eosinophils Relative: 3 %
HCT: 37.1 % (ref 36.0–46.0)
Hemoglobin: 12.3 g/dL (ref 12.0–15.0)
Immature Granulocytes: 0 %
Lymphocytes Relative: 67 %
Lymphs Abs: 3.9 K/uL (ref 0.7–4.0)
MCH: 28.2 pg (ref 26.0–34.0)
MCHC: 33.2 g/dL (ref 30.0–36.0)
MCV: 85.1 fL (ref 80.0–100.0)
Monocytes Absolute: 0.8 K/uL (ref 0.1–1.0)
Monocytes Relative: 14 %
Neutro Abs: 0.9 K/uL — ABNORMAL LOW (ref 1.7–7.7)
Neutrophils Relative %: 15 %
Platelets: 204 K/uL (ref 150–400)
RBC: 4.36 MIL/uL (ref 3.87–5.11)
RDW: 13.1 % (ref 11.5–15.5)
Smear Review: NORMAL
WBC: 5.8 K/uL (ref 4.0–10.5)
nRBC: 0 % (ref 0.0–0.2)

## 2024-01-21 LAB — SEDIMENTATION RATE: Sed Rate: 2 mm/h (ref 0–30)

## 2024-01-21 LAB — C-REACTIVE PROTEIN: CRP: 0.5 mg/dL (ref <1.0)

## 2024-01-21 NOTE — Progress Notes (Signed)
 NAME: Joanna Phillips  DOB: 1951-05-11  MRN: [id number]  Date/Time: 01/21/2024 9:58 AM   Subjective:  Follow up visit here with her husband TIAHNA CURE is a 73 year old female with chronic lymphocytic leukemia, rt ca breast DCIS s/p lumpectomy , mycobacterium chelonae infection of the foot, trigeminal neuralgia   History as follows Jan-Feb 2025- start of a wound on the left foot 08/14/23 Surgical  I/D 09/01/23 referred to me for mycobacterium chelonae and Nocardia- was on bactrim  Doxy and azithromycin   09/02/23 - 09/11/23 hospitalized  [facility] with fever, chills No cause identified ( I thought it could be bactrim  related and discussed with [facility] Doc who did not think that was the case  Called [facility] no nocardia Susceptibility of Mycobacterium chelonae reported on 09/23/23-  First week May 2025 sudden onset blurry vision- saw [facility] Uveitis questioned - sent to [facility] opthal  Also had some worsening hearing loss over baseline hearing issue post shingles- saw ENT   09/24/23 - I saw patient had severe abdominal pain , vomiting, weight loss DC all antibiotics ( at that time was on azithromycin , doxy and bactrim )  09/28/23- saw [facility] B/l anterior uveitis and they questioned Mycobacterium chelonae  10/01/23- All GI symptoms resolved  restarted azithromycin  Added linezolid   10/15/23 saw patient and she was improving and tolerating meds  6/9 and 7/1- saw Dr.Fitzgerald- labs done and okay  12/03/23 in my clinic Pt says her eye sight is much better . Hearing is better  She is walking with a cane' overall doing better  The wound on the left foot is samller but has a black color and I removed that and took a culture Staph cohnii- asked her to do BID linezolid  for a week  8/5- wound still the same but no black slough 12/31/23 Dr.Fowler did biopsy and also sent afb smear and culture 9/4- Pt is continuing linezolid  600mg  BID along with azithromycin  500mg  every day Using vashe and  collagenase dressing by HN The size of the wound is 50% smaller   Medical history  The left foot infection began before February 2025 with a small bump that progressively enlarged. Surgical intervention was performed on August 14, 2023, to I/D and tissue biopsy and culture by Dr.Fowler. Post-surgery, she received home health care for wound dressing three times a week. . She completed a course of Ciprofloxacin  and Doxycycline , which initially seemed to help, but new lesions  appeared. 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 She takes gabapentin  800 mg three times a day and oxcarbazepine  three times a day for pain management. She also takes omeprazole  20 mg daily.  Her past medical history includes chronic lymphocytic leukemia diagnosed in 2011 or 2012, currently under observation without active treatment. She also had right-sided breast cancer in 2018, treated with surgery and radiation, but no chemotherapy. No history of diabetes, hypertension, or heart issues.   She has not engaged in yard work or been scratched by pets. She had a pedicure earlier this year, performed by her licensed niece, which involved soaking her feet in a whirlpool.  Past Medical History:  Diagnosis Date   Arthritis    Breast cancer (HCC) 2017   DCIS   CLL (chronic lymphocytic leukemia) (HCC) 2011   DCIS (ductal carcinoma in situ)  03/11/2016   GERD (gastroesophageal reflux disease)    Hyponatremia    Personal history of radiation therapy 2017    Past Surgical History:  Procedure Laterality Date   BREAST BIOPSY Right 02/12/2016   DCIS   BREAST BIOPSY Right 12/27/2019   path pending, calcs, x marker   BREAST LUMPECTOMY Right 03/11/2016   DCIS neg margins   CHOLECYSTECTOMY  1996   COLONOSCOPY  2013   COLONOSCOPY WITH PROPOFOL   N/A 04/19/2021   Procedure: COLONOSCOPY WITH PROPOFOL ;  Surgeon: Onita Elspeth Sharper, DO;  Location: [facility] ENDOSCOPY;  Service: Gastroenterology;  Laterality: N/A;   IRRIGATION AND DEBRIDEMENT FOOT Left 08/14/2023   Procedure: IRRIGATION AND DEBRIDEMENT FOOT;  Surgeon: Ashley Soulier, DPM;  Location: [facility] ORS;  Service: Orthopedics/Podiatry;  Laterality: Left;   squamous cell skin on scalp surgery Right     Social History   Socioeconomic History   Marital status: Married    Spouse name: Not on file   Number of children: Not on file   Years of education: Not on file   Highest education level: Not on file  Occupational History   Occupation: retired   Tobacco Use   Smoking status: Never   Smokeless tobacco: Never  Vaping Use   Vaping status: Never Used  Substance and Sexual Activity   Alcohol  use: No   Drug use: No   Sexual activity: Not on file  Other Topics Concern   Not on file  Social History Narrative   Not on file   Social Drivers of Health   Financial Resource Strain: Low Risk  (10/20/2023)   Received from [facility]   Overall Financial Resource Strain (CARDIA)    Difficulty of Paying Living Expenses: Not hard at all  Food Insecurity: No Food Insecurity (10/20/2023)   Received from [facility]   Hunger Vital Sign    Within the past 12 months, you worried that your food would run out before you got the money to buy more.: Never true    Within the past 12 months, the food you bought just didn't last and you didn't have money to get more.: Never true  Transportation Needs: No Transportation Needs (10/20/2023)   Received from [facility]    In the past 12 months, has lack of transportation kept you from medical appointments or from getting medications?: No    Lack of Transportation (Non-Medical): No  Physical Activity: Insufficiently Active (01/11/2019)   Exercise Vital Sign    Days of Exercise  per Week: 3 days    Minutes of Exercise per Session: 30 min  Stress: No Stress Concern Present (01/11/2019)   [employer] of Occupational Health - Occupational Stress Questionnaire    Feeling of Stress : Not at all  Social Connections: Moderately Integrated (08/13/2023)   Social Connection and Isolation Panel    Frequency of Communication with Friends and Family: More than three times a week    Frequency of Social Gatherings with Friends and Family: More than three times a week    Attends Religious Services: More than 4 times per year    Active Member of [employer] or Organizations: No    Attends Banker Meetings: Never    Marital Status: Married  Catering manager Violence: Not At Risk (08/13/2023)   Humiliation, Afraid, Rape, and Kick questionnaire    Fear of Current or Ex-Partner: No    Emotionally Abused: No    Physically  Abused: No    Sexually Abused: No    Family History  Problem Relation Age of Onset   Cancer Father        lung   Diabetes Mother    Breast cancer Maternal Aunt 68   Breast cancer Maternal Aunt 53   Allergies  Allergen Reactions   Amoxicillin Hives   Hydrocodone Itching   Sulfamethoxazole -Trimethoprim  Nausea Only   Allopurinol  Rash   I? Current Outpatient Medications  Medication Sig Dispense Refill   acetaminophen  (TYLENOL ) 650 MG CR tablet Take 1 tablet (650 mg total) by mouth every 8 (eight) hours as needed for pain.     atropine 1 % ophthalmic solution Place 1 drop into both eyes 2 (two) times daily     azithromycin  (ZITHROMAX ) 500 MG tablet Take 1 tablet (500 mg total) by mouth daily. 30 tablet 1   calcium -vitamin D (OSCAL WITH D) 500-200 MG-UNIT TABS tablet Take 1 tablet by mouth daily.     Carboxymethylcellulose Sodium (REFRESH CELLUVISC OP) Apply 1 drop to eye every 4 (four) hours as needed.     Difluprednate 0.05 % EMUL Place 1 drop into both eyes 6 (six) times daily     fluticasone  (FLONASE ) 50 MCG/ACT nasal spray Place into the  nose.     gabapentin  (NEURONTIN ) 800 MG tablet Take 1 tablet (800 mg total) by mouth 3 (three) times daily. Home med. 270 tablet 1   linezolid  (ZYVOX ) 600 MG tablet Take 1 tablet (600 mg total) by mouth daily. 30 tablet 1   Multiple Vitamin (MULTIVITAMIN WITH MINERALS) TABS tablet Take 1 tablet by mouth daily.     omeprazole  (PRILOSEC) 20 MG capsule Take 1 capsule by mouth once daily 90 capsule 3   OXcarbazepine  (TRILEPTAL ) 150 MG tablet Take 300 mg by mouth 2 (two) times daily.     potassium chloride (KLOR-CON) 10 MEQ tablet Take 10 mEq by mouth daily.     torsemide (DEMADEX) 20 MG tablet Take 20 mg by mouth daily.     vitamin B-12 (CYANOCOBALAMIN ) 250 MCG tablet Take 250 mcg by mouth daily.     No current facility-administered medications for this visit.     Abtx:  Anti-infectives (From admission, onward)    None       REVIEW OF SYSTEMS:  Const: negative fever, negative chills, negative weight loss Eyes: negative diplopia or visual changes, negative eye pain ENT: negative coryza, negative sore throat Resp: negative cough, hemoptysis, dyspnea Cards: negative for chest pain, palpitations, lower extremity edema GU: negative for frequency, dysuria and hematuria GI:  no  abdominal pain, no nausea vomiting, appetitie great Skin: negative for rash and pruritus Heme: negative for easy bruising and gum/nose bleeding MS: more energy and is able to walk a little Neurolo:no headaches Psych: negative for feelings of anxiety, depression  Endocrine: negative for thyroid, diabetes Allergy/Immunology- Amoxicillin HIVEs Sulfa   fever, vomiting  Objective:  VITALS:  BP 109/67   Pulse 71   Temp (!) 97.3 F (36.3 C) (Temporal)   SpO2 96%   PHYSICAL EXAM:  General: Alert, cooperative, no distress, appears stated age.  Head: Normocephalic, without obvious abnormality, atraumatic. Eyes: Conjunctivae clear, anicteric sclerae. Pupils are equal ENT Nares normal. No drainage or sinus  tenderness. Lips, mucosa, normal. No Thrush Neck: Supple, symmetrical, no adenopathy, thyroid: non tender no carotid bruit and no JVD.  Lungs: Clear to auscultation bilaterally. No Wheezing or Rhonchi. No rales. Heart: Regular rate and rhythm, no murmur, rub or gallop. Abdomen: Soft,minimal  tenderness - Bowel sounds N Extremities: b/l venous edema  12/22/23- wound much smaller- some erythema and superficial skin wetness around wound is due to the foam dressing glue reaction   12/03/23   I cleaned the black pigmentation        10/15/23     Left > rt Left foot near the 3/4 th toe- ulcerated wound on the dorsum, much better    4/15     Skin: No rashes or lesions. Or bruising Lymph: Cervical, supraclavicular normal. Neurologic: tongue paralysis rt side   Microbiology: 08/14/23 Microbiology of wound mycobacterium chelonae Confirmed Nocardia not there and it has been corrected     Pathology 8/14 MINIMAL PERIVASCULAR  AND INTERSTITIAL MIXED INFLAMMATORY INFILTRATE, WITH  FIBROSIS AND NO OVERLYING EPIDERMIS,     IMAGING RESULTS: MRI of left foot- 08/13/23  Mild subcutaneous fat edema and swelling throughout the dorsolateral midfoot, greatest at the level of the metatarsal shafts. No walled-off fluid collection to indicate an abscess. 2. No evidence of acute osteomyelitis.   I have personally reviewed the films ? Impression/Recommendation ?Mycobacterium chelonae f Left foot infection involving soft tissue- no bones involved  -   surgical debridement on August 14, 2023. MRI from March shows no bone involvemen Pt was on doxy, azithromycin  and bactrim ( this was given because Nocardia was reported previously) Been on appropriate antibiotics since 10/01/23--Treatment requires a 3-4 month antibiotic regimen.  Now I am finding some improvement- 50% smaller- she has been taking azithromycin  500mg  every day and inadvertenlty staying on Linezolid  600mg  BID for the past  6 weeks- She initally was on every day dosing but increased as she had staph cohnii in July- She was supposed to do BID for 1 week only It looks like the higher frequency may have helped with wound healing Will check labs today to make sure no evidence of bone marrow suppression On 8/14 she had repeat biopsy and no granulomas- AFB culture neg so far     Bactrim  severe GI symptoms and likely caused fever    Vision impairment-  anterior uveitis ( there is a concern that it could be due to chelonae - I dount it is due to chelona- wonder whether it was bactrim -  Blood culture for AFB sent on 5/15- negative  Chronic lymphocytic leukemia (CLL)   Stage 1 CLL is under observation with no active treatment. R]Followed by Dr.Rao- recent immunoglobulin level shows normal IgG but low IgM and IgA.  Discussed with Dr.Rao- whether IVIG is helpful- she will get back to me  Postherpetic neuralgia  /trigeminal neuralgia In feb had radifrequecy ablation of rt trigeminal nerve- on oxcarbazepine .(Trileptal ) and gabapentin   Persistent pain post-shingles infection in 2017 affects the right side of the head and neck. Continue management with gabapentin  and   Breast cancer (right side, in situ)   Right-sided breast cancer was treated with surgery and radiation in 2018. No chemotherapy was administered. ? ? Follow up 3 week Labs on 7/31    Discussed in detail with patient and her husband  Follow up 1 month___________________________

## 2024-01-21 NOTE — Patient Instructions (Addendum)
 Your wound is so much better with the current management for mycobacterium chelonae- you have surrounding inflamed skin due to the glue on the foam dressing- today I am jut doing a gauze and tape- Medipore you are currently taking linezolid  BID ( which was changed from every day to BID for the staph chnii you had in one culture) and azithromycin  500mg  every day- will do lab today and see whether your cbc is okay

## 2024-01-22 ENCOUNTER — Other Ambulatory Visit: Payer: Self-pay | Admitting: Infectious Diseases

## 2024-01-22 DIAGNOSIS — A318 Other mycobacterial infections: Secondary | ICD-10-CM

## 2024-01-22 NOTE — Progress Notes (Signed)
 Continue linezolid  600mg  BID along with azithromycin  500mg  every day- CBC N- will repeat labs ( CBC/BMP in 10-14 days)

## 2024-01-25 ENCOUNTER — Encounter (INDEPENDENT_AMBULATORY_CARE_PROVIDER_SITE_OTHER): Payer: Self-pay | Admitting: Vascular Surgery

## 2024-01-25 ENCOUNTER — Ambulatory Visit: Payer: Self-pay

## 2024-01-25 ENCOUNTER — Ambulatory Visit (INDEPENDENT_AMBULATORY_CARE_PROVIDER_SITE_OTHER): Admitting: Vascular Surgery

## 2024-01-25 VITALS — BP 120/76 | HR 73 | Resp 18 | Wt 190.4 lb

## 2024-01-25 DIAGNOSIS — I872 Venous insufficiency (chronic) (peripheral): Secondary | ICD-10-CM | POA: Diagnosis not present

## 2024-01-25 DIAGNOSIS — M1710 Unilateral primary osteoarthritis, unspecified knee: Secondary | ICD-10-CM | POA: Diagnosis not present

## 2024-01-25 DIAGNOSIS — I890 Lymphedema, not elsewhere classified: Secondary | ICD-10-CM | POA: Diagnosis not present

## 2024-01-25 DIAGNOSIS — K219 Gastro-esophageal reflux disease without esophagitis: Secondary | ICD-10-CM

## 2024-01-25 NOTE — Progress Notes (Unsigned)
 MRN : [id number]  Joanna Phillips is a 73 y.o. (1950-07-29) female who presents with chief complaint of legs swell.  History of Present Illness: ***  Current Meds  Medication Sig   acetaminophen  (TYLENOL ) 650 MG CR tablet Take 1 tablet (650 mg total) by mouth every 8 (eight) hours as needed for pain.   atropine 1 % ophthalmic solution Place 1 drop into both eyes 2 (two) times daily   azithromycin  (ZITHROMAX ) 500 MG tablet Take 1 tablet (500 mg total) by mouth daily.   calcium -vitamin D (OSCAL WITH D) 500-200 MG-UNIT TABS tablet Take 1 tablet by mouth daily.   Carboxymethylcellulose Sodium (REFRESH CELLUVISC OP) Apply 1 drop to eye every 4 (four) hours as needed.   Difluprednate 0.05 % EMUL Place 1 drop into both eyes 6 (six) times daily   fluticasone  (FLONASE ) 50 MCG/ACT nasal spray Place into the nose.   gabapentin  (NEURONTIN ) 800 MG tablet Take 1 tablet (800 mg total) by mouth 3 (three) times daily. Home med.   linezolid  (ZYVOX ) 600 MG tablet Take 1 tablet (600 mg total) by mouth daily.   Multiple Vitamin (MULTIVITAMIN WITH MINERALS) TABS tablet Take 1 tablet by mouth daily.   omeprazole  (PRILOSEC) 20 MG capsule Take 1 capsule by mouth once daily   OXcarbazepine  (TRILEPTAL ) 150 MG tablet Take 300 mg by mouth 2 (two) times daily.   potassium chloride (KLOR-CON) 10 MEQ tablet Take 10 mEq by mouth daily.   torsemide (DEMADEX) 20 MG tablet Take 20 mg by mouth daily.   vitamin B-12 (CYANOCOBALAMIN ) 250 MCG tablet Take 250 mcg by mouth daily.    Past Medical History:  Diagnosis Date   Arthritis    Breast cancer (HCC) 2017   DCIS   CLL (chronic lymphocytic leukemia) (HCC) 2011   DCIS (ductal carcinoma in situ) 03/11/2016   GERD (gastroesophageal reflux disease)    Hyponatremia    Personal history of radiation therapy 2017    Past Surgical History:  Procedure Laterality Date   BREAST BIOPSY Right 02/12/2016   DCIS   BREAST BIOPSY Right 12/27/2019    path pending, calcs, x marker   BREAST LUMPECTOMY Right 03/11/2016   DCIS neg margins   CHOLECYSTECTOMY  1996   COLONOSCOPY  2013   COLONOSCOPY WITH PROPOFOL  N/A 04/19/2021   Procedure: COLONOSCOPY WITH PROPOFOL ;  Surgeon: Onita Elspeth Sharper, DO;  Location: [facility] ENDOSCOPY;  Service: Gastroenterology;  Laterality: N/A;   IRRIGATION AND DEBRIDEMENT FOOT Left 08/14/2023   Procedure: IRRIGATION AND DEBRIDEMENT FOOT;  Surgeon: Ashley Soulier, DPM;  Location: [facility] ORS;  Service: Orthopedics/Podiatry;  Laterality: Left;   squamous cell skin on scalp surgery Right     Social History Social History   Tobacco Use   Smoking status: Never   Smokeless tobacco: Never  Vaping Use   Vaping status: Never Used  Substance Use Topics   Alcohol  use: No   Drug use: No    Family History Family History  Problem Relation Age of Onset   Cancer Father        lung   Diabetes Mother    Breast cancer Maternal Aunt 60   Breast cancer Maternal Aunt 74    Allergies  Allergen Reactions   Amoxicillin Hives   Hydrocodone Itching   Sulfamethoxazole -Trimethoprim  Other (See Comments)    Fever, vomiting weakness  Allopurinol  Rash     REVIEW OF SYSTEMS (Negative unless checked)  Constitutional: [] Weight loss  [] Fever  [] Chills Cardiac: [] Chest pain   [] Chest pressure   [] Palpitations   [] Shortness of breath when laying flat   [] Shortness of breath with exertion. Vascular:  [] Pain in legs with walking   [x] Pain in legs with standing  [] History of DVT   [] Phlebitis   [x] Swelling in legs   [] Varicose veins   [] Non-healing ulcers Pulmonary:   [] Uses home oxygen   [] Productive cough   [] Hemoptysis   [] Wheeze  [] COPD   [] Asthma Neurologic:  [] Dizziness   [] Seizures   [] History of stroke   [] History of TIA  [] Aphasia   [] Vissual changes   [] Weakness or numbness in arm   [] Weakness or numbness in leg Musculoskeletal:   [] Joint swelling   [] Joint pain   [] Low back pain Hematologic:  [] Easy bruising  [] Easy  bleeding   [] Hypercoagulable state   [] Anemic Gastrointestinal:  [] Diarrhea   [] Vomiting  [] Gastroesophageal reflux/heartburn   [] Difficulty swallowing. Genitourinary:  [] Chronic kidney disease   [] Difficult urination  [] Frequent urination   [] Blood in urine Skin:  [] Rashes   [] Ulcers  Psychological:  [] History of anxiety   []  History of major depression.  Physical Examination  Vitals:   01/25/24 1018  BP: 120/76  Pulse: 73  Resp: 18  Weight: 190 lb 6.4 oz (86.4 kg)   Body mass index is 31.68 kg/m. Gen: WD/WN, NAD Head: [place]/AT, No temporalis wasting.  Ear/Nose/Throat: Hearing grossly intact, nares w/o erythema or drainage, pinna without lesions Eyes: PER, EOMI, sclera nonicteric.  Neck: Supple, no gross masses.  No JVD.  Pulmonary:  Good air movement, no audible wheezing, no use of accessory muscles.  Cardiac: RRR, precordium not hyperdynamic. Vascular:  scattered varicosities present bilaterally.  Mild venous stasis changes to the legs bilaterally.  3-4+ soft pitting edema, CEAP [id number]  Vessel Right Left  Radial Palpable Palpable  Gastrointestinal: soft, non-distended. No guarding/no peritoneal signs.  Musculoskeletal: M/S 5/5 throughout.  No deformity.  Neurologic: CN 2-12 intact. Pain and light touch intact in extremities.  Symmetrical.  Speech is fluent. Motor exam as listed above. Psychiatric: Judgment intact, Mood & affect appropriate for pt's clinical situation. Dermatologic: Venous rashes no ulcers noted.  No changes consistent with cellulitis. Lymph : No lichenification or skin changes of chronic lymphedema.  CBC Lab Results  Component Value Date   WBC 5.8 01/21/2024   HGB 12.3 01/21/2024   HCT 37.1 01/21/2024   MCV 85.1 01/21/2024   PLT 204 01/21/2024    BMET    Component Value Date/Time   NA 134 (L) 01/21/2024 1058   NA 141 02/13/2016 1049   K 4.4 01/21/2024 1058   CL 101 01/21/2024 1058   CO2 23 01/21/2024 1058   GLUCOSE 111 (H) 01/21/2024 1058   BUN  31 (H) 01/21/2024 1058   BUN 18 02/13/2016 1049   CREATININE 0.81 01/21/2024 1058   CALCIUM  9.0 01/21/2024 1058   GFRNONAA >60 01/21/2024 1058   GFRAA >60 02/06/2020 0957   Estimated Creatinine Clearance: 67.2 mL/min (by C-G formula based on SCr of 0.81 mg/dL).  COAG Lab Results  Component Value Date   INR 1.3 (H) 02/25/2022    Radiology MM 3D SCREENING MAMMOGRAM BILATERAL BREAST Result Date: 12/31/2023 CLINICAL DATA:  Screening. Personal history of malignant RIGHT lumpectomy in 2017. EXAM: DIGITAL SCREENING BILATERAL MAMMOGRAM WITH TOMOSYNTHESIS AND CAD TECHNIQUE: Bilateral screening digital craniocaudal and mediolateral oblique mammograms were obtained. Bilateral  screening digital breast tomosynthesis was performed. The images were evaluated with computer-aided detection. COMPARISON:  Previous exam(s). ACR Breast Density Category b: There are scattered areas of fibroglandular density. FINDINGS: There are no findings suspicious for malignancy. Expected post lumpectomy changes involving the RIGHT breast. IMPRESSION: No mammographic evidence of malignancy. A result letter of this screening mammogram will be mailed directly to the patient. RECOMMENDATION: Screening mammogram in one year. (Code:SM-B-01Y) BI-RADS CATEGORY  2: Benign. Electronically Signed   By: Debby Satterfield M.D.   On: 12/31/2023 14:56     Assessment/Plan There are no diagnoses linked to this encounter.   Cordella Shawl, MD  01/25/2024 10:28 AM

## 2024-01-28 ENCOUNTER — Encounter (INDEPENDENT_AMBULATORY_CARE_PROVIDER_SITE_OTHER): Payer: Self-pay | Admitting: Vascular Surgery

## 2024-02-01 ENCOUNTER — Other Ambulatory Visit
Admission: RE | Admit: 2024-02-01 | Discharge: 2024-02-01 | Disposition: A | Discharge status: Home / Self Care | Attending: Infectious Diseases | Admitting: Infectious Diseases

## 2024-02-01 DIAGNOSIS — A318 Other mycobacterial infections: Secondary | ICD-10-CM | POA: Diagnosis present

## 2024-02-01 LAB — BASIC METABOLIC PANEL WITH GFR
Anion gap: 11 (ref 5–15)
BUN: 17 mg/dL (ref 8–23)
CO2: 26 mmol/L (ref 22–32)
Calcium: 9 mg/dL (ref 8.9–10.3)
Chloride: 98 mmol/L (ref 98–111)
Creatinine, Ser: 0.79 mg/dL (ref 0.44–1.00)
GFR, Estimated: >60 mL/min (ref >60)
Glucose, Bld: 116 mg/dL — ABNORMAL HIGH (ref 70–99)
Potassium: 4.4 mmol/L (ref 3.5–5.1)
Sodium: 135 mmol/L (ref 135–145)

## 2024-02-01 LAB — CBC WITH DIFFERENTIAL/PLATELET
Abs Immature Granulocytes: 0.04 K/uL (ref 0.00–0.07)
Basophils Absolute: 0.1 K/uL (ref 0.0–0.1)
Basophils Relative: 1 %
Eosinophils Absolute: 0.2 K/uL (ref 0.0–0.5)
Eosinophils Relative: 3 %
HCT: 38.6 % (ref 36.0–46.0)
Hemoglobin: 12.4 g/dL (ref 12.0–15.0)
Immature Granulocytes: 1 %
Lymphocytes Relative: 63 %
Lymphs Abs: 5.4 K/uL — ABNORMAL HIGH (ref 0.7–4.0)
MCH: 27.7 pg (ref 26.0–34.0)
MCHC: 32.1 g/dL (ref 30.0–36.0)
MCV: 86.2 fL (ref 80.0–100.0)
Monocytes Absolute: 0.6 K/uL (ref 0.1–1.0)
Monocytes Relative: 7 %
Neutro Abs: 2.1 K/uL (ref 1.7–7.7)
Neutrophils Relative %: 25 %
Platelets: 200 K/uL (ref 150–400)
RBC: 4.48 MIL/uL (ref 3.87–5.11)
RDW: 13.2 % (ref 11.5–15.5)
Smear Review: NORMAL
WBC: 8.4 K/uL (ref 4.0–10.5)
nRBC: 0 % (ref 0.0–0.2)

## 2024-02-05 ENCOUNTER — Ambulatory Visit: Payer: Self-pay

## 2024-02-08 ENCOUNTER — Other Ambulatory Visit: Payer: Self-pay | Admitting: Infectious Diseases

## 2024-02-10 ENCOUNTER — Other Ambulatory Visit: Payer: Self-pay | Admitting: Infectious Diseases

## 2024-02-10 MED ORDER — LINEZOLID 600 MG PO TABS: 600.0000 mg | ORAL_TABLET | Freq: Every day | ORAL | 1 refills | Status: AC

## 2024-02-10 NOTE — Progress Notes (Signed)
 Spoke to patient Renewed prescription for linezolid  Been on 2 meds for cheloma with slow improvement Will likely add a third drug clofazamine after checking DDI

## 2024-02-18 ENCOUNTER — Ambulatory Visit: Attending: Infectious Diseases | Admitting: Infectious Diseases

## 2024-02-18 ENCOUNTER — Other Ambulatory Visit
Admission: RE | Admit: 2024-02-18 | Discharge: 2024-02-18 | Disposition: A | Discharge status: Home / Self Care | Source: Ambulatory Visit | Attending: Infectious Diseases | Admitting: Infectious Diseases

## 2024-02-18 ENCOUNTER — Encounter: Payer: Self-pay | Admitting: Infectious Diseases

## 2024-02-18 VITALS — BP 113/71 | HR 62 | Temp 98.1°F | Ht 65.0 in | Wt 192.0 lb

## 2024-02-18 DIAGNOSIS — Z923 Personal history of irradiation: Secondary | ICD-10-CM | POA: Diagnosis not present

## 2024-02-18 DIAGNOSIS — Z853 Personal history of malignant neoplasm of breast: Secondary | ICD-10-CM | POA: Diagnosis not present

## 2024-02-18 DIAGNOSIS — H209 Unspecified iridocyclitis: Secondary | ICD-10-CM | POA: Diagnosis not present

## 2024-02-18 DIAGNOSIS — L089 Local infection of the skin and subcutaneous tissue, unspecified: Secondary | ICD-10-CM | POA: Diagnosis present

## 2024-02-18 DIAGNOSIS — A318 Other mycobacterial infections: Secondary | ICD-10-CM | POA: Insufficient documentation

## 2024-02-18 DIAGNOSIS — G500 Trigeminal neuralgia: Secondary | ICD-10-CM | POA: Diagnosis not present

## 2024-02-18 DIAGNOSIS — B0229 Other postherpetic nervous system involvement: Secondary | ICD-10-CM | POA: Insufficient documentation

## 2024-02-18 DIAGNOSIS — C9110 Chronic lymphocytic leukemia of B-cell type not having achieved remission: Secondary | ICD-10-CM | POA: Insufficient documentation

## 2024-02-18 DIAGNOSIS — Z23 Encounter for immunization: Secondary | ICD-10-CM | POA: Diagnosis not present

## 2024-02-18 LAB — CBC WITH DIFFERENTIAL/PLATELET
Basophils Absolute: 0 K/uL (ref 0.0–0.1)
Basophils Relative: 0 %
Eosinophils Absolute: 0 K/uL (ref 0.0–0.5)
Eosinophils Relative: 0 %
HCT: 37.7 % (ref 36.0–46.0)
Hemoglobin: 12.1 g/dL (ref 12.0–15.0)
Lymphocytes Relative: 66 %
Lymphs Abs: 6.5 K/uL — ABNORMAL HIGH (ref 0.7–4.0)
MCH: 28 pg (ref 26.0–34.0)
MCHC: 32.1 g/dL (ref 30.0–36.0)
MCV: 87.3 fL (ref 80.0–100.0)
Monocytes Absolute: 0.4 K/uL (ref 0.1–1.0)
Monocytes Relative: 4 %
Neutro Abs: 2.9 K/uL (ref 1.7–7.7)
Neutrophils Relative %: 30 %
Platelets: 212 K/uL (ref 150–400)
RBC: 4.32 MIL/uL (ref 3.87–5.11)
RDW: 14.7 % (ref 11.5–15.5)
Smear Review: NORMAL
WBC: 9.8 K/uL (ref 4.0–10.5)
nRBC: 0 % (ref 0.0–0.2)

## 2024-02-18 LAB — COMPREHENSIVE METABOLIC PANEL WITH GFR
ALT: 25 U/L (ref 0–44)
AST: 29 U/L (ref 15–41)
Albumin: 4.3 g/dL (ref 3.5–5.0)
Alkaline Phosphatase: 82 U/L (ref 38–126)
Anion gap: 8 (ref 5–15)
BUN: 30 mg/dL — ABNORMAL HIGH (ref 8–23)
CO2: 28 mmol/L (ref 22–32)
Calcium: 9.1 mg/dL (ref 8.9–10.3)
Chloride: 102 mmol/L (ref 98–111)
Creatinine, Ser: 0.84 mg/dL (ref 0.44–1.00)
GFR, Estimated: >60 mL/min (ref >60)
Glucose, Bld: 114 mg/dL — ABNORMAL HIGH (ref 70–99)
Potassium: 4.8 mmol/L (ref 3.5–5.1)
Sodium: 138 mmol/L (ref 135–145)
Total Bilirubin: 0.7 mg/dL (ref 0.0–1.2)
Total Protein: 6.9 g/dL (ref 6.5–8.1)

## 2024-02-18 LAB — SEDIMENTATION RATE: Sed Rate: 3 mm/h (ref 0–30)

## 2024-02-18 LAB — C-REACTIVE PROTEIN: CRP: <0.5 mg/dL (ref <1.0)

## 2024-02-18 NOTE — Progress Notes (Signed)
 NAME: Joanna Phillips  DOB: 10/15/1950  MRN: [id number]  Date/Time: 02/18/2024 9:31 AM   Subjective:  Follow up visit here with her husband Joanna Phillips is a 74 year old female with chronic lymphocytic leukemia, rt ca breast DCIS s/p lumpectomy , mycobacterium chelonae infection of the foot, trigeminal neuralgia Last seen on 01/21/2024 Patient is doing better No specific complaints  History as follows Jan-Feb 2025- start of a wound on the left foot 08/14/23 Surgical  I/D 09/01/23 referred to me for mycobacterium chelonae and Nocardia- was on bactrim  Doxy and azithromycin   09/02/23 - 09/11/23 hospitalized  [facility] with fever, chills No cause identified ( I thought it could be bactrim  related and discussed with [facility] Doc who did not think that was the case  [facility]ed [facility] no nocardia Susceptibility of Mycobacterium chelonae reported on 09/23/23-  First week May 2025 sudden onset blurry vision- saw [facility] Uveitis questioned - sent to [facility] opthal  Also had some worsening hearing loss over baseline hearing issue post shingles- saw ENT   09/24/23 - I saw patient had severe abdominal pain , vomiting, weight loss DC all antibiotics ( at that time was on azithromycin , doxy and bactrim )  09/28/23- saw [facility] B/l anterior uveitis and they questioned Mycobacterium chelonae  10/01/23- All GI symptoms resolved  restarted azithromycin  Added linezolid   10/15/23 saw patient and she was improving and tolerating meds  6/9 and 7/1- saw Dr.Fitzgerald- labs done and okay  12/03/23 in my clinic Pt says her eye sight is much better . Hearing is better  She is walking with a cane' overall doing better  The wound on the left foot is samller but has a black color and I removed that and took a culture Staph cohnii- asked her to do BID linezolid  for a week  8/5- wound still the same but no black slough 12/31/23 Dr.Fowler did biopsy and also sent afb smear and culture 9/4- Pt is continuing linezolid  600mg   BID along with azithromycin  500mg  every day Using vashe and collagenase dressing by HN The size of the wound is 50% smaller  02/01/2024 labs CBC and and CMP normal  02/11/2024: Linezolid  reduced to 600 mg daily Azithromycin  continuing at 500 mg daily Tissue culture for AFB negative from 12/31/2023  Medical history  The left foot infection began before February 2025 with a small bump that progressively enlarged. Surgical intervention was performed on August 14, 2023, to I/D and tissue biopsy and culture by Dr.Fowler. Post-surgery, she received home health care for wound dressing three times a week. . She completed a course of Ciprofloxacin  and Doxycycline , which initially seemed to help, but new lesions  appeared. 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 She takes gabapentin  800 mg three times a day and oxcarbazepine  three times a day for pain management. She also takes omeprazole  20 mg daily.  Her past medical history includes chronic lymphocytic leukemia diagnosed in 2011 or 2012, currently under observation without active treatment. She also had right-sided breast cancer in 2018, treated with surgery and radiation, but no chemotherapy. No history of diabetes, hypertension, or heart issues.   She has not engaged in yard work or been scratched by pets. She had a pedicure earlier this year, performed by her licensed niece, which involved soaking her feet  in a whirlpool.  Past Medical History:  Diagnosis Date   Arthritis    Breast cancer (HCC) 2017   DCIS   CLL (chronic lymphocytic leukemia) (HCC) 2011   DCIS (ductal carcinoma in situ) 03/11/2016   GERD (gastroesophageal reflux disease)    Hyponatremia    Personal history of radiation therapy 2017    Past Surgical History:  Procedure Laterality Date    BREAST BIOPSY Right 02/12/2016   DCIS   BREAST BIOPSY Right 12/27/2019   path pending, calcs, x marker   BREAST LUMPECTOMY Right 03/11/2016   DCIS neg margins   CHOLECYSTECTOMY  1996   COLONOSCOPY  2013   COLONOSCOPY WITH PROPOFOL  N/A 04/19/2021   Procedure: COLONOSCOPY WITH PROPOFOL ;  Surgeon: Onita Elspeth Sharper, DO;  Location: [facility] ENDOSCOPY;  Service: Gastroenterology;  Laterality: N/A;   IRRIGATION AND DEBRIDEMENT FOOT Left 08/14/2023   Procedure: IRRIGATION AND DEBRIDEMENT FOOT;  Surgeon: Ashley Soulier, DPM;  Location: [facility] ORS;  Service: Orthopedics/Podiatry;  Laterality: Left;   squamous cell skin on scalp surgery Right     Social History   Socioeconomic History   Marital status: Married    Spouse name: Not on file   Number of children: Not on file   Years of education: Not on file   Highest education level: Not on file  Occupational History   Occupation: retired   Tobacco Use   Smoking status: Never   Smokeless tobacco: Never  Vaping Use   Vaping status: Never Used  Substance and Sexual Activity   Alcohol  use: No   Drug use: No   Sexual activity: Not on file  Other Topics Concern   Not on file  Social History Narrative   Not on file   Social Drivers of Health   Financial Resource Strain: Low Risk  (10/20/2023)   Received from [facility]   Overall Financial Resource Strain (CARDIA)    Difficulty of Paying Living Expenses: Not hard at all  Food Insecurity: No Food Insecurity (10/20/2023)   Received from [facility]   Hunger Vital Sign    Within the past 12 months, you worried that your food would run out before you got the money to buy more.: Never true    Within the past 12 months, the food you bought just didn't last and you didn't have money to get more.: Never true  Transportation Needs: No Transportation Needs (10/20/2023)   Received from [facility] - Transportation    In the past 12 months,  has lack of transportation kept you from medical appointments or from getting medications?: No    Lack of Transportation (Non-Medical): No  Physical Activity: Insufficiently Active (01/11/2019)   Exercise Vital Sign    Days of Exercise per Week: 3 days    Minutes of Exercise per Session: 30 min  Stress: No Stress Concern Present (01/11/2019)   [employer] of Occupational Health - Occupational Stress Questionnaire    Feeling of Stress : Not at all  Social Connections: Moderately Integrated (08/13/2023)   Social Connection and Isolation Panel    Frequency of Communication with Friends and Family: More than three times a week    Frequency of Social Gatherings with Friends and Family: More than three times a week    Attends Religious Services: More than 4 times per year    Active Member of [employer] or Organizations: No    Attends Banker Meetings: Never  Marital Status: Married  Catering manager Violence: Not At Risk (08/13/2023)   Humiliation, Afraid, Rape, and Kick questionnaire    Fear of Current or Ex-Partner: No    Emotionally Abused: No    Physically Abused: No    Sexually Abused: No    Family History  Problem Relation Age of Onset   Cancer Father        lung   Diabetes Mother    Breast cancer Maternal Aunt 60   Breast cancer Maternal Aunt 84   Allergies  Allergen Reactions   Amoxicillin Hives   Hydrocodone Itching   Sulfamethoxazole -Trimethoprim  Other (See Comments)    Fever, vomiting weakness   Allopurinol  Rash   I? Current Outpatient Medications  Medication Sig Dispense Refill   acetaminophen  (TYLENOL ) 650 MG CR tablet Take 1 tablet (650 mg total) by mouth every 8 (eight) hours as needed for pain.     atropine 1 % ophthalmic solution Place 1 drop into both eyes 2 (two) times daily     azithromycin  (ZITHROMAX ) 500 MG tablet Take 1 tablet (500 mg total) by mouth daily. 30 tablet 1   calcium -vitamin D (OSCAL WITH D) 500-200 MG-UNIT TABS tablet Take 1  tablet by mouth daily.     Carboxymethylcellulose Sodium (REFRESH CELLUVISC OP) Apply 1 drop to eye every 4 (four) hours as needed.     Difluprednate 0.05 % EMUL Place 1 drop into both eyes 6 (six) times daily     fluticasone  (FLONASE ) 50 MCG/ACT nasal spray Place into the nose.     gabapentin  (NEURONTIN ) 800 MG tablet Take 1 tablet (800 mg total) by mouth 3 (three) times daily. Home med. 270 tablet 1   linezolid  (ZYVOX ) 600 MG tablet Take 1 tablet (600 mg total) by mouth daily. 30 tablet 1   Multiple Vitamin (MULTIVITAMIN WITH MINERALS) TABS tablet Take 1 tablet by mouth daily.     omeprazole  (PRILOSEC) 20 MG capsule Take 1 capsule by mouth once daily 90 capsule 3   OXcarbazepine  (TRILEPTAL ) 150 MG tablet Take 300 mg by mouth 2 (two) times daily.     potassium chloride (KLOR-CON) 10 MEQ tablet Take 10 mEq by mouth daily.     torsemide (DEMADEX) 20 MG tablet Take 20 mg by mouth daily.     vitamin B-12 (CYANOCOBALAMIN ) 250 MCG tablet Take 250 mcg by mouth daily.     No current facility-administered medications for this visit.     Abtx:  Anti-infectives (From admission, onward)    None       REVIEW OF SYSTEMS:  Const: negative fever, negative chills, negative weight loss Eyes: negative diplopia or visual changes, negative eye pain ENT: negative coryza, negative sore throat Resp: negative cough, hemoptysis, dyspnea Cards: negative for chest pain, palpitations, lower extremity edema GU: negative for frequency, dysuria and hematuria GI:  no  abdominal pain, no nausea vomiting, appetitie great Skin: negative for rash and pruritus Heme: negative for easy bruising and gum/nose bleeding MS: more energy and is able to walk a little Neurolo:no headaches Psych: negative for feelings of anxiety, depression  Endocrine: negative for thyroid, diabetes Allergy/Immunology- Amoxicillin HIVEs Sulfa   fever, vomiting  Objective:  VITALS:  BP 113/71   Pulse 62   Temp 98.1 F (36.7 C)  (Temporal)   Ht 5' 5 (1.651 m)   Wt 192 lb (87.1 kg)   SpO2 97%   BMI 31.95 kg/m   PHYSICAL EXAM:  General: Alert, cooperative, no distress, appears stated age.  Head: Normocephalic,  without obvious abnormality, atraumatic. Eyes: Conjunctivae clear, anicteric sclerae. Pupils are equal ENT Nares normal. No drainage or sinus tenderness. Lips, mucosa, normal. No Thrush Neck: Supple, symmetrical, no adenopathy, thyroid: non tender no carotid bruit and no JVD.  Lungs: Clear to auscultation bilaterally. No Wheezing or Rhonchi. No rales. Heart: Regular rate and rhythm, no murmur, rub or gallop. Abdomen: Soft,minimal tenderness - Bowel sounds N Extremities: b/l venous edema  02/18/24- wound very small and surrounding skin N   01/21/24 reaction to the dressing      12/22/23- wound much smaller- some erythema and superficial skin wetness around wound is due to the foam dressing glue reaction   12/03/23   I cleaned the black pigmentation        10/15/23     Left > rt Left foot near the 3/4 th toe- ulcerated wound on the dorsum, much better    4/15     Skin: No rashes or lesions. Or bruising Lymph: Cervical, supraclavicular normal. Neurologic: tongue paralysis rt side   Microbiology: 08/14/23 Microbiology of wound mycobacterium chelonae Confirmed Nocardia not there and it has been corrected     Pathology 8/14 MINIMAL PERIVASCULAR  AND INTERSTITIAL MIXED INFLAMMATORY INFILTRATE, WITH  FIBROSIS AND NO OVERLYING EPIDERMIS,     IMAGING RESULTS: MRI of left foot- 08/13/23  Mild subcutaneous fat edema and swelling throughout the dorsolateral midfoot, greatest at the level of the metatarsal shafts. No walled-off fluid collection to indicate an abscess. 2. No evidence of acute osteomyelitis.   I have personally reviewed the films ? Impression/Recommendation ?Mycobacterium chelonae f Left foot infection involving soft tissue- no bones involved  -    surgical debridement on August 14, 2023. MRI from March shows no bone involvemen Pt was on doxy, azithromycin  and bactrim ( this was given because Nocardia was reported previously) Been on appropriate antibiotics since 10/01/23--Treatment requires a 3-4 month antibiotic regimen.  Now I am finding some improvement- 50% smaller- she has been taking azithromycin  500mg  every day and inadvertenlty staying on Linezolid  600mg  BID for the past 6 weeks- She initally was on every day dosing but increased as she had staph cohnii in July- She was supposed to do BID for 1 week only It looks like the higher frequency may have helped with wound healing Will check labs today to make sure no evidence of bone marrow suppression On 8/14 she had repeat biopsy and no granulomas- AFB culture neg  Doing much better the wound is very small today and she is currently on linezolid  600 mg daily and azithromycin  5 mg daily She is pleated 4-1/2 months of treatment.  May give her for another 2 weeks or so  Bactrim  severe GI symptoms and likely caused fever    Vision impairment-  anterior uveitis ( there is a concern that it could be due to chelonae - I dount it is due to chelona- wonder whether it was bactrim -  Blood culture for AFB sent on 5/15- negative  Chronic lymphocytic leukemia (CLL)   Stage 1 CLL is under observation with no active treatment. R]Followed by Dr.Rao- recent immunoglobulin level shows normal IgG but low IgM and IgA.  Discussed with Dr.Rao- whether IVIG is helpful- she will get back to me  Postherpetic neuralgia  /trigeminal neuralgia In feb had radifrequecy ablation of rt trigeminal nerve- on oxcarbazepine .(Trileptal ) and gabapentin   Persistent pain post-shingles infection in 2017 affects the right side of the head and neck. Continue management with gabapentin  and   Breast cancer (right  side, in situ)   Right-sided breast cancer was treated with surgery and radiation in 2018. No chemotherapy was  administered. ? ? Follow up 3 week Labs on 7/31    Discussed in detail with patient and her husband  Follow up 1 month___________________________

## 2024-03-22 ENCOUNTER — Ambulatory Visit: Admitting: Infectious Diseases

## 2024-03-29 ENCOUNTER — Other Ambulatory Visit
Admission: RE | Admit: 2024-03-29 | Discharge: 2024-03-29 | Disposition: A | Discharge status: Home / Self Care | Source: Ambulatory Visit | Attending: Infectious Diseases | Admitting: Infectious Diseases

## 2024-03-29 ENCOUNTER — Ambulatory Visit: Attending: Infectious Diseases | Admitting: Infectious Diseases

## 2024-03-29 ENCOUNTER — Encounter: Payer: Self-pay | Admitting: Infectious Diseases

## 2024-03-29 VITALS — BP 144/71 | HR 74 | Temp 97.7°F | Ht 65.0 in | Wt 192.0 lb

## 2024-03-29 DIAGNOSIS — C9110 Chronic lymphocytic leukemia of B-cell type not having achieved remission: Secondary | ICD-10-CM | POA: Diagnosis present

## 2024-03-29 DIAGNOSIS — L97528 Non-pressure chronic ulcer of other part of left foot with other specified severity: Secondary | ICD-10-CM | POA: Diagnosis not present

## 2024-03-29 DIAGNOSIS — Z923 Personal history of irradiation: Secondary | ICD-10-CM | POA: Diagnosis not present

## 2024-03-29 DIAGNOSIS — Z86000 Personal history of in-situ neoplasm of breast: Secondary | ICD-10-CM | POA: Diagnosis present

## 2024-03-29 DIAGNOSIS — A311 Cutaneous mycobacterial infection: Secondary | ICD-10-CM

## 2024-03-29 DIAGNOSIS — B0222 Postherpetic trigeminal neuralgia: Secondary | ICD-10-CM | POA: Insufficient documentation

## 2024-03-29 DIAGNOSIS — A431 Cutaneous nocardiosis: Secondary | ICD-10-CM | POA: Diagnosis not present

## 2024-03-29 DIAGNOSIS — H209 Unspecified iridocyclitis: Secondary | ICD-10-CM | POA: Diagnosis not present

## 2024-03-29 LAB — CBC WITH DIFFERENTIAL/PLATELET
Abs Immature Granulocytes: 0.01 K/uL (ref 0.00–0.07)
Basophils Absolute: 0.1 K/uL (ref 0.0–0.1)
Basophils Relative: 1 %
Eosinophils Absolute: 0.2 K/uL (ref 0.0–0.5)
Eosinophils Relative: 3 %
HCT: 34.6 % — ABNORMAL LOW (ref 36.0–46.0)
Hemoglobin: 11.5 g/dL — ABNORMAL LOW (ref 12.0–15.0)
Immature Granulocytes: 0 %
Lymphocytes Relative: 51 %
Lymphs Abs: 4.1 K/uL — ABNORMAL HIGH (ref 0.7–4.0)
MCH: 28.6 pg (ref 26.0–34.0)
MCHC: 33.2 g/dL (ref 30.0–36.0)
MCV: 86.1 fL (ref 80.0–100.0)
Monocytes Absolute: 0.6 K/uL (ref 0.1–1.0)
Monocytes Relative: 7 %
Neutro Abs: 2.9 K/uL (ref 1.7–7.7)
Neutrophils Relative %: 38 %
Platelets: 260 K/uL (ref 150–400)
RBC: 4.02 MIL/uL (ref 3.87–5.11)
RDW: 14.6 % (ref 11.5–15.5)
WBC: 7.9 K/uL (ref 4.0–10.5)
nRBC: 0 % (ref 0.0–0.2)

## 2024-03-29 LAB — COMPREHENSIVE METABOLIC PANEL WITH GFR
ALT: 16 U/L (ref 0–44)
AST: 20 U/L (ref 15–41)
Albumin: 4.5 g/dL (ref 3.5–5.0)
Alkaline Phosphatase: 103 U/L (ref 38–126)
Anion gap: 9 (ref 5–15)
BUN: 18 mg/dL (ref 8–23)
CO2: 27 mmol/L (ref 22–32)
Calcium: 9.2 mg/dL (ref 8.9–10.3)
Chloride: 93 mmol/L — ABNORMAL LOW (ref 98–111)
Creatinine, Ser: 0.86 mg/dL (ref 0.44–1.00)
GFR, Estimated: >60 mL/min (ref >60)
Glucose, Bld: 111 mg/dL — ABNORMAL HIGH (ref 70–99)
Potassium: 4.8 mmol/L (ref 3.5–5.1)
Sodium: 128 mmol/L — ABNORMAL LOW (ref 135–145)
Total Bilirubin: 0.3 mg/dL (ref 0.0–1.2)
Total Protein: 6.6 g/dL (ref 6.5–8.1)

## 2024-03-29 NOTE — Patient Instructions (Addendum)
 Today you are here for follow up- The left foot wound has healed completely. You can stop azithromycin  and linezolid  Will do labs today. Avoid manicure/pedicure in a salon- do it at home if needed Follow up with me as needed

## 2024-03-29 NOTE — Progress Notes (Signed)
 NAME: Joanna Phillips  DOB: [birth year]  MRN: [id number]  Date/Time: 03/29/2024 9:35 AM   Subjective:  Follow up visit here with her husband TIYA SCHRUPP is a 73 year old female with chronic lymphocytic leukemia, rt ca breast DCIS s/p lumpectomy , mycobacterium chelonae infection of the foot, trigeminal neuralgia Last seen on 02/18/24 Patient is doing better The wound on the left foot has closed  History as follows Jan-Feb 2025- start of a wound on the left foot 08/14/23 Surgical  I/D 09/01/23 referred to me for mycobacterium chelonae and Nocardia- was on bactrim  Doxy and azithromycin   09/02/23 - 09/11/23 hospitalized  [facility] with fever, chills No cause identified ( I thought it could be bactrim  related and discussed with [facility] Doc who did not think that was the case  Called [facility] no nocardia Susceptibility of Mycobacterium chelonae reported on 09/23/23-  First week May 2025 sudden onset blurry vision- saw [facility] Uveitis questioned - [facility]  Also had some worsening hearing loss over baseline hearing issue post shingles- saw ENT   09/24/23 - I saw patient had severe abdominal pain , vomiting, weight loss DC all antibiotics ( at that time was on azithromycin , doxy and bactrim )  09/28/23- saw [facility] opthal B/l anterior uveitis and they questioned Mycobacterium chelonae  10/01/23- All GI symptoms resolved  restarted azithromycin  Added linezolid   10/15/23 saw patient and she was improving and tolerating meds  6/9 and 7/1- saw Dr.Fitzgerald- labs done and okay  12/03/23 in my clinic Pt says her eye sight is much better . Hearing is better  She is walking with a cane' overall doing better  The wound on the left foot is samller but has a black color and I removed that and took a culture Staph cohnii- asked her to do BID linezolid  for a week  8/5- wound still the same but no black slough 12/31/23 Dr.Fowler did biopsy and also sent afb smear and culture 9/4- Pt is continuing  linezolid  600mg  BID along with azithromycin  500mg  every day Using vashe and collagenase dressing by HN The size of the wound is 50% smaller  02/01/2024 labs CBC and and CMP normal  02/11/2024: Linezolid  reduced to 600 mg daily Azithromycin  continuing at 500 mg daily Tissue culture for AFB negative from 12/31/2023  Medical history  The left foot infection began before February 2025 with a small bump that progressively enlarged. Surgical intervention was performed on August 14, 2023, to I/D and tissue biopsy and culture by Dr.Fowler. Post-surgery, she received home health care for wound dressing three times a week. . She completed a course of Ciprofloxacin  and Doxycycline , which initially seemed to help, but new lesions  appeared. 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 She takes gabapentin  800 mg three times a day and oxcarbazepine  three times a day for pain management. She also takes omeprazole  20 mg daily.  Her past medical history includes chronic lymphocytic leukemia diagnosed in 2011 or 2012, currently under observation without active treatment. She also had right-sided breast cancer in 2018, treated with surgery and radiation, but no chemotherapy. No history of diabetes, hypertension, or heart issues.   She has not engaged in yard work or been scratched by pets. She had a pedicure earlier this year, performed by her licensed niece,  which involved soaking her feet in a whirlpool.  Past Medical History:  Diagnosis Date   Arthritis    Breast cancer (HCC) 2017   DCIS   CLL (chronic lymphocytic leukemia) (HCC) 2011   DCIS (ductal carcinoma in situ) 03/11/2016   GERD (gastroesophageal reflux disease)    Hyponatremia    Personal history of radiation therapy 2017    Past Surgical History:  Procedure  Laterality Date   BREAST BIOPSY Right 02/12/2016   DCIS   BREAST BIOPSY Right 12/27/2019   path pending, calcs, x marker   BREAST LUMPECTOMY Right 03/11/2016   DCIS neg margins   CHOLECYSTECTOMY  1996   COLONOSCOPY  2013   COLONOSCOPY WITH PROPOFOL  N/A 04/19/2021   Procedure: COLONOSCOPY WITH PROPOFOL ;  Surgeon: Onita Elspeth Sharper, DO;  Location: [facility] ENDOSCOPY;  Service: Gastroenterology;  Laterality: N/A;   IRRIGATION AND DEBRIDEMENT FOOT Left 08/14/2023   Procedure: IRRIGATION AND DEBRIDEMENT FOOT;  Surgeon: Ashley Soulier, DPM;  Location: [facility] ORS;  Service: Orthopedics/Podiatry;  Laterality: Left;   squamous cell skin on scalp surgery Right     Social History   Socioeconomic History   Marital status: Married    Spouse name: Not on file   Number of children: Not on file   Years of education: Not on file   Highest education level: Not on file  Occupational History   Occupation: retired   Tobacco Use   Smoking status: Never   Smokeless tobacco: Never  Vaping Use   Vaping status: Never Used  Substance and Sexual Activity   Alcohol  use: No   Drug use: No   Sexual activity: Not on file  Other Topics Concern   Not on file  Social History Narrative   Not on file   Social Drivers of Health   Financial Resource Strain: Low Risk  (03/07/2024)   Received from [facility]   Overall Financial Resource Strain (CARDIA)    Difficulty of Paying Living Expenses: Not hard at all  Food Insecurity: No Food Insecurity (03/07/2024)   Received from [facility]   Hunger Vital Sign    Within the past 12 months, you worried that your food would run out before you got the money to buy more.: Never true    Within the past 12 months, the food you bought just didn't last and you didn't have money to get more.: Never true  Transportation Needs: No Transportation Needs (03/07/2024)   Received from [facility] - Transportation     In the past 12 months, has lack of transportation kept you from medical appointments or from getting medications?: No    Lack of Transportation (Non-Medical): No  Physical Activity: Insufficiently Active (01/11/2019)   Exercise Vital Sign    Days of Exercise per Week: 3 days    Minutes of Exercise per Session: 30 min  Stress: No Stress Concern Present (01/11/2019)   [employer] - Occupational Stress Questionnaire    Feeling of Stress : Not at all  Social Connections: Moderately Integrated (08/13/2023)   Social Connection and Isolation Panel    Frequency of Communication with Friends and Family: More than three times a week    Frequency of Social Gatherings with Friends and Family: More than three times a week    Attends Religious Services: More than 4 times per year    Active Member of [employer] or Organizations: No    Attends Banker  Meetings: Never    Marital Status: Married  Catering Manager Violence: Not At Risk (08/13/2023)   Humiliation, Afraid, Rape, and Kick questionnaire    Fear of Current or Ex-Partner: No    Emotionally Abused: No    Physically Abused: No    Sexually Abused: No    Family History  Problem Relation Age of Onset   Cancer Father        lung   Diabetes Mother    Breast cancer Maternal Aunt 60   Breast cancer Maternal Aunt 54   Allergies  Allergen Reactions   Amoxicillin Hives   Hydrocodone Itching   Sulfamethoxazole -Trimethoprim  Other (See Comments)    Fever, vomiting weakness   Allopurinol  Rash   I? Current Outpatient Medications  Medication Sig Dispense Refill   acetaminophen  (TYLENOL ) 650 MG CR tablet Take 1 tablet (650 mg total) by mouth every 8 (eight) hours as needed for pain.     apixaban (ELIQUIS) 5 MG TABS tablet Take 5 mg by mouth 2 (two) times daily.     atropine 1 % ophthalmic solution Place 1 drop into both eyes 2 (two) times daily     azithromycin  (ZITHROMAX ) 500 MG tablet Take 1 tablet (500 mg total)  by mouth daily. 30 tablet 1   calcium -vitamin D (OSCAL WITH D) 500-200 MG-UNIT TABS tablet Take 1 tablet by mouth daily.     Carboxymethylcellulose Sodium (REFRESH CELLUVISC OP) Apply 1 drop to eye every 4 (four) hours as needed.     Difluprednate 0.05 % EMUL Place 1 drop into both eyes 6 (six) times daily     fluticasone  (FLONASE ) 50 MCG/ACT nasal spray Place into the nose.     gabapentin  (NEURONTIN ) 800 MG tablet Take 1 tablet (800 mg total) by mouth 3 (three) times daily. Home med. 270 tablet 1   linezolid  (ZYVOX ) 600 MG tablet Take 1 tablet (600 mg total) by mouth daily. 30 tablet 1   Multiple Vitamin (MULTIVITAMIN WITH MINERALS) TABS tablet Take 1 tablet by mouth daily.     omeprazole  (PRILOSEC) 20 MG capsule Take 1 capsule by mouth once daily 90 capsule 3   OXcarbazepine  (TRILEPTAL ) 150 MG tablet Take 300 mg by mouth 2 (two) times daily.     potassium chloride (KLOR-CON) 10 MEQ tablet Take 10 mEq by mouth daily.     torsemide (DEMADEX) 20 MG tablet Take 20 mg by mouth daily.     vitamin B-12 (CYANOCOBALAMIN ) 250 MCG tablet Take 250 mcg by mouth daily.     No current facility-administered medications for this visit.     Abtx:  Anti-infectives (From admission, onward)    None       REVIEW OF SYSTEMS:  Const: negative fever, negative chills, negative weight loss Eyes: negative diplopia or visual changes, negative eye pain ENT: negative coryza, negative sore throat Resp: negative cough, hemoptysis, dyspnea Cards: negative for chest pain, palpitations, lower extremity edema GU: negative for frequency, dysuria and hematuria GI:  no  abdominal pain, no nausea vomiting, appetitie great Skin: negative for rash and pruritus Heme: negative for easy bruising and gum/nose bleeding MS: more energy and is able to walk a little Neurolo:no headaches Psych: negative for feelings of anxiety, depression  Endocrine: negative for thyroid, diabetes Allergy/Immunology- Amoxicillin HIVEs Sulfa    fever, vomiting  Objective:  VITALS:  BP (!) 144/71   Pulse 74   Temp 97.7 F (36.5 C) (Temporal)   Ht 5' 5 (1.651 m)   Wt 192 lb (87.1 kg)  SpO2 97%   BMI 31.95 kg/m   PHYSICAL EXAM:  General: Alert, cooperative, no distress, appears stated age.  Head: Normocephalic, without obvious abnormality, atraumatic. Eyes: Conjunctivae clear, anicteric sclerae. Pupils are equal ENT Nares normal. No drainage or sinus tenderness. Lips, mucosa, normal. No Thrush Neck: Supple, symmetrical, no adenopathy, thyroid: non tender no carotid bruit and no JVD.  Lungs: Clear to auscultation bilaterally. No Wheezing or Rhonchi. No rales. Heart: Regular rate and rhythm, no murmur, rub or gallop. Abdomen: Soft,minimal tenderness - Bowel sounds N Extremities: b/l venous edema 03/29/24- wound has healed- now hasa scar     02/18/24- wound very small and surrounding skin N   01/21/24 reaction to the dressing      12/22/23- wound much smaller- some erythema and superficial skin wetness around wound is due to the foam dressing glue reaction   12/03/23   I cleaned the black pigmentation        10/15/23     Left > rt Left foot near the 3/4 th toe- ulcerated wound on the dorsum, much better    4/15     Skin: No rashes or lesions. Or bruising Lymph: Cervical, supraclavicular normal. Neurologic: tongue paralysis rt side   Microbiology: 08/14/23 Microbiology of wound mycobacterium chelonae Confirmed Nocardia not there and it has been corrected     Pathology 8/14 MINIMAL PERIVASCULAR  AND INTERSTITIAL MIXED INFLAMMATORY INFILTRATE, WITH  FIBROSIS AND NO OVERLYING EPIDERMIS,     IMAGING RESULTS: MRI of left foot- 08/13/23  Mild subcutaneous fat edema and swelling throughout the dorsolateral midfoot, greatest at the level of the metatarsal shafts. No walled-off fluid collection to indicate an abscess. 2. No evidence of acute osteomyelitis.   I have personally  reviewed the films ? Impression/Recommendation ?Mycobacterium chelonae f Left foot infection involving soft tissue- no bones involved  -   surgical debridement on August 14, 2023. MRI from March shows no bone involvement Pt was on doxy, azithromycin  and bactrim ( this was given because Nocardia was reported previously) Been on appropriate antibiotics since 10/01/23-- azithormycin and linezolid   On 8/14 she had repeat biopsy and no granulomas- AFB culture neg  The infection has healed completely- She has taken 6 months of antibiotic- will stop both linezolid  /azithromycin  today  Bactrim  severe GI symptoms and likely caused fever    Vision impairment-  anterior uveitis ( there is a concern that it could be due to chelonae - I doubt it was due to chelonae- wonder whether it was bactrim -  Blood culture for AFB sent on 5/15- negative  Chronic lymphocytic leukemia (CLL)   Stage 1 CLL is under observation with no active treatment. Followed by Dr.Rao- recent immunoglobulin level shows normal IgG but low IgM and IgA.   Postherpetic neuralgia  /trigeminal neuralgia In feb had radifrequecy ablation of rt trigeminal nerve- on oxcarbazepine .(Trileptal ) and gabapentin   Persistent pain post-shingles infection in 2017 affects the right side of the head and neck. Continue management with gabapentin  and   Breast cancer (right side, in situ)   Right-sided breast cancer was treated with surgery and radiation in 2018. No chemotherapy was administered. ? ?  Discussed in detail with patient and her husband  Follow as needed  __________________________

## 2024-03-31 ENCOUNTER — Other Ambulatory Visit: Payer: Self-pay

## 2024-03-31 ENCOUNTER — Ambulatory Visit: Payer: Self-pay | Admitting: Infectious Diseases

## 2024-03-31 DIAGNOSIS — C9110 Chronic lymphocytic leukemia of B-cell type not having achieved remission: Secondary | ICD-10-CM

## 2024-04-01 ENCOUNTER — Encounter: Payer: Self-pay | Admitting: Oncology

## 2024-04-01 ENCOUNTER — Inpatient Hospital Stay: Payer: Medicare Other

## 2024-04-01 ENCOUNTER — Telehealth: Payer: Self-pay

## 2024-04-01 ENCOUNTER — Inpatient Hospital Stay: Payer: Medicare Other | Attending: Oncology | Admitting: Oncology

## 2024-04-01 DIAGNOSIS — Z7901 Long term (current) use of anticoagulants: Secondary | ICD-10-CM | POA: Diagnosis not present

## 2024-04-01 DIAGNOSIS — M1990 Unspecified osteoarthritis, unspecified site: Secondary | ICD-10-CM | POA: Insufficient documentation

## 2024-04-01 DIAGNOSIS — Z08 Encounter for follow-up examination after completed treatment for malignant neoplasm: Secondary | ICD-10-CM

## 2024-04-01 DIAGNOSIS — L089 Local infection of the skin and subcutaneous tissue, unspecified: Secondary | ICD-10-CM | POA: Diagnosis not present

## 2024-04-01 DIAGNOSIS — Z1231 Encounter for screening mammogram for malignant neoplasm of breast: Secondary | ICD-10-CM | POA: Diagnosis not present

## 2024-04-01 DIAGNOSIS — Z86000 Personal history of in-situ neoplasm of breast: Secondary | ICD-10-CM | POA: Diagnosis not present

## 2024-04-01 DIAGNOSIS — Z801 Family history of malignant neoplasm of trachea, bronchus and lung: Secondary | ICD-10-CM | POA: Insufficient documentation

## 2024-04-01 DIAGNOSIS — C9110 Chronic lymphocytic leukemia of B-cell type not having achieved remission: Secondary | ICD-10-CM | POA: Diagnosis not present

## 2024-04-01 DIAGNOSIS — Z882 Allergy status to sulfonamides status: Secondary | ICD-10-CM | POA: Insufficient documentation

## 2024-04-01 DIAGNOSIS — D649 Anemia, unspecified: Secondary | ICD-10-CM | POA: Insufficient documentation

## 2024-04-01 DIAGNOSIS — Z923 Personal history of irradiation: Secondary | ICD-10-CM | POA: Insufficient documentation

## 2024-04-01 DIAGNOSIS — Z833 Family history of diabetes mellitus: Secondary | ICD-10-CM | POA: Diagnosis not present

## 2024-04-01 DIAGNOSIS — D0511 Intraductal carcinoma in situ of right breast: Secondary | ICD-10-CM | POA: Insufficient documentation

## 2024-04-01 DIAGNOSIS — Z885 Allergy status to narcotic agent status: Secondary | ICD-10-CM | POA: Insufficient documentation

## 2024-04-01 DIAGNOSIS — C9111 Chronic lymphocytic leukemia of B-cell type in remission: Secondary | ICD-10-CM | POA: Insufficient documentation

## 2024-04-01 DIAGNOSIS — T8140XA Infection following a procedure, unspecified, initial encounter: Secondary | ICD-10-CM | POA: Insufficient documentation

## 2024-04-01 DIAGNOSIS — Z880 Allergy status to penicillin: Secondary | ICD-10-CM | POA: Insufficient documentation

## 2024-04-01 DIAGNOSIS — Z79899 Other long term (current) drug therapy: Secondary | ICD-10-CM | POA: Insufficient documentation

## 2024-04-01 DIAGNOSIS — Z170 Estrogen receptor positive status [ER+]: Secondary | ICD-10-CM | POA: Insufficient documentation

## 2024-04-01 DIAGNOSIS — Z9049 Acquired absence of other specified parts of digestive tract: Secondary | ICD-10-CM | POA: Diagnosis not present

## 2024-04-01 DIAGNOSIS — Z803 Family history of malignant neoplasm of breast: Secondary | ICD-10-CM | POA: Insufficient documentation

## 2024-04-01 NOTE — Telephone Encounter (Signed)
 Patient's husband called concerned with low sodium results from 03/29/24 at Dr. Lanelle office.  Mr. Starrett received a call from Dr. Merceda office today advising to call PCP to discuss low sodium.    They are worried about PCP not being available to discuss until next week.  I spoke to Dr. Melanee regarding patient's concerns.  Dr. Melanee did see the results at MD visit today and advised that sodium level is low but not a critical level.    Patient's husband notified.

## 2024-04-01 NOTE — Progress Notes (Signed)
 Patient has had surgery and infection in foot that has been going on since February.

## 2024-04-01 NOTE — Progress Notes (Signed)
 Hematology/Oncology Consult note [facility]  Telephone:([phone] [phone]  Patient Care Team: Lenon Layman ORN, MD as PCP - General (Internal Medicine) Dellie Louanne MATSU, MD (General Surgery) Schermerhorn, Debby PARAS, MD as Referring Physician (Obstetrics and Gynecology) Dora Delon Murray, MD as Referring Physician (General Surgery)   Name of the patient: Joanna Phillips  [id number]  10/27/1950   Date of visit: 04/01/24  Diagnosis- Rai stage I CLL History of right breast DCIS  Chief complaint/ Reason for visit- routine f/u of CLL and dcis  Heme/Onc history: Patient is a 73 year old female diagnosed with right breast DCIS ER positive in October 2017.Pathology showed 3.9 cm grade 3 DCISOn lumpectomy.  She completed adjuvant radiation treatment and started Arimidex  In February 2018.  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  SPEP was normal.  Chest, abdomen, and pelvic CT scan revealed small diffuse nodes.  She has been under observation for her CLL and has not required any treatment so far.  Interval history- Discussed the use of AI scribe software for clinical note transcription with the patient, who gave verbal consent to proceed.  Joanna Phillips is a 73 year old female who presents for follow-up after completing antibiotic treatment for a foot infection.  She has been managing a foot infection following surgery since February. The infection was treated with erythromycin and linezolid , and her white blood cell count was regularly monitored. She completed her antibiotic course yesterday and notes that the infection is  healing.  ECOG PS- 1 Pain scale- 2 Opioid associated constipation- no  Review of systems- Review of Systems  Musculoskeletal:        Ace wrap in place over left foot      Allergies  Allergen Reactions   Amoxicillin Hives   Hydrocodone Itching   Sulfamethoxazole -Trimethoprim  Other (See Comments)    Fever, vomiting weakness   Allopurinol  Rash     Past Medical History:  Diagnosis Date   Arthritis    Breast cancer (HCC) 2017   DCIS   CLL (chronic lymphocytic leukemia) (HCC) 2011   DCIS (ductal carcinoma in situ) 03/11/2016   GERD (gastroesophageal reflux disease)    Hyponatremia    Personal history of radiation therapy 2017     Past Surgical History:  Procedure Laterality Date   BREAST BIOPSY Right 02/12/2016   DCIS   BREAST BIOPSY Right 12/27/2019   path pending, calcs, x marker   BREAST LUMPECTOMY Right 03/11/2016   DCIS neg margins   CHOLECYSTECTOMY  1996   COLONOSCOPY  2013   COLONOSCOPY WITH PROPOFOL  N/A 04/19/2021   Procedure: COLONOSCOPY WITH PROPOFOL ;  Surgeon: Onita Elspeth Sharper, DO;  Location: [facility] ENDOSCOPY;  Service: Gastroenterology;  Laterality: N/A;   IRRIGATION AND DEBRIDEMENT FOOT Left 08/14/2023   Procedure: IRRIGATION AND DEBRIDEMENT FOOT;  Surgeon: Ashley Soulier, DPM;  Location: [facility] ORS;  Service: Orthopedics/Podiatry;  Laterality: Left;   squamous cell skin on scalp surgery Right     Social History   Socioeconomic History   Marital status: Married    Spouse name: Not on file   Number of children: Not on file   Years of education: Not on file  Highest education level: Not on file  Occupational History   Occupation: retired   Tobacco Use   Smoking status: Never   Smokeless tobacco: Never  Vaping Use   Vaping status: Never Used  Substance and Sexual Activity   Alcohol  use: No   Drug use: No   Sexual activity: Not on file  Other Topics Concern   Not on file  Social History Narrative   Not on file   Social Drivers of Health    Financial Resource Strain: Low Risk  (03/07/2024)   Received from [facility]   Overall Financial Resource Strain (CARDIA)    Difficulty of Paying Living Expenses: Not hard at all  Food Insecurity: No Food Insecurity (03/07/2024)   Received from [facility]   Hunger Vital Sign    Within the past 12 months, you worried that your food would run out before you got the money to buy more.: Never true    Within the past 12 months, the food you bought just didn't last and you didn't have money to get more.: Never true  Transportation Needs: No Transportation Needs (03/07/2024)   Received from [facility] - Transportation    In the past 12 months, has lack of transportation kept you from medical appointments or from getting medications?: No    Lack of Transportation (Non-Medical): No  Physical Activity: Insufficiently Active (01/11/2019)   Exercise Vital Sign    Days of Exercise per Week: 3 days    Minutes of Exercise per Session: 30 min  Stress: No Stress Concern Present (01/11/2019)   [employer] - Occupational Stress Questionnaire    Feeling of Stress : Not at all  Social Connections: Moderately Integrated (08/13/2023)   Social Connection and Isolation Panel    Frequency of Communication with Friends and Family: More than three times a week    Frequency of Social Gatherings with Friends and Family: More than three times a week    Attends Religious Services: More than 4 times per year    Active Member of [employer] or Organizations: No    Attends Banker Meetings: Never    Marital Status: Married  Catering Manager Violence: Not At Risk (08/13/2023)   Humiliation, Afraid, Rape, and Kick questionnaire    Fear of Current or Ex-Partner: No    Emotionally Abused: No    Physically Abused: No    Sexually Abused: No    Family History  Problem Relation Age of Onset   Cancer Father        lung    Diabetes Mother    Breast cancer Maternal Aunt 60   Breast cancer Maternal Aunt 82     Current Outpatient Medications:    acetaminophen  (TYLENOL ) 650 MG CR tablet, Take 1 tablet (650 mg total) by mouth every 8 (eight) hours as needed for pain., Disp: , Rfl:    apixaban (ELIQUIS) 5 MG TABS tablet, Take 5 mg by mouth 2 (two) times daily., Disp: , Rfl:    calcium -vitamin D (OSCAL WITH D) 500-200 MG-UNIT TABS tablet, Take 1 tablet by mouth daily., Disp: , Rfl:    Carboxymethylcellulose Sodium (REFRESH CELLUVISC OP), Apply 1 drop to eye every 4 (four) hours as needed., Disp: , Rfl:    fluticasone  (FLONASE ) 50 MCG/ACT nasal spray, Place into the nose., Disp: , Rfl:    gabapentin  (NEURONTIN ) 800 MG tablet, Take 1 tablet (800 mg total) by mouth 3 (  three) times daily. Home med., Disp: 270 tablet, Rfl: 1   Multiple Vitamin (MULTIVITAMIN WITH MINERALS) TABS tablet, Take 1 tablet by mouth daily., Disp: , Rfl:    omeprazole  (PRILOSEC) 20 MG capsule, Take 1 capsule by mouth once daily, Disp: 90 capsule, Rfl: 3   OXcarbazepine  (TRILEPTAL ) 150 MG tablet, Take 300 mg by mouth 2 (two) times daily., Disp: , Rfl:    vitamin B-12 (CYANOCOBALAMIN ) 250 MCG tablet, Take 250 mcg by mouth daily., Disp: , Rfl:    atropine 1 % ophthalmic solution, Place 1 drop into both eyes 2 (two) times daily, Disp: , Rfl:    azithromycin  (ZITHROMAX ) 500 MG tablet, Take 1 tablet (500 mg total) by mouth daily. (Patient not taking: Reported on 04/01/2024), Disp: 30 tablet, Rfl: 1   Difluprednate 0.05 % EMUL, Place 1 drop into both eyes 6 (six) times daily, Disp: , Rfl:    linezolid  (ZYVOX ) 600 MG tablet, Take 1 tablet (600 mg total) by mouth daily. (Patient not taking: Reported on 04/01/2024), Disp: 30 tablet, Rfl: 1   potassium chloride (KLOR-CON) 10 MEQ tablet, Take 10 mEq by mouth daily. (Patient not taking: Reported on 04/01/2024), Disp: , Rfl:    torsemide (DEMADEX) 20 MG tablet, Take 20 mg by mouth daily. (Patient not taking:  Reported on 04/01/2024), Disp: , Rfl:   Physical exam:  Vitals:   04/01/24 1047  BP: 139/70  Pulse: 62  Resp: 18  Temp: 97.7 F (36.5 C)  TempSrc: Tympanic  SpO2: 100%  Weight: 194 lb 6.4 oz (88.2 kg)  Height: 5' 5 (1.651 m)   Physical Exam Cardiovascular:     Rate and Rhythm: Normal rate and regular rhythm.     Heart sounds: Normal heart sounds.  Pulmonary:     Effort: Pulmonary effort is normal.     Breath sounds: Normal breath sounds.  Skin:    General: Skin is warm and dry.  Neurological:     Mental Status: She is alert and oriented to person, place, and time.    Breast exam was performed in seated and lying down position. Patient is status post right lumpectomy with a well-healed surgical scar. No evidence of any palpable masses. No evidence of axillary adenopathy. No evidence of any palpable masses or lumps in the left breast. No evidence of leftt axillary adenopathy   I have personally reviewed labs listed below:    Latest Ref Rng & Units 03/29/2024   10:23 AM  CMP  Glucose 70 - 99 mg/dL 888   BUN 8 - 23 mg/dL 18   Creatinine 9.55 - 1.00 mg/dL 9.13   Sodium 864 - 854 mmol/L 128   Potassium 3.5 - 5.1 mmol/L 4.8   Chloride 98 - 111 mmol/L 93   CO2 22 - 32 mmol/L 27   Calcium  8.9 - 10.3 mg/dL 9.2   Total Protein 6.5 - 8.1 g/dL 6.6   Total Bilirubin 0.0 - 1.2 mg/dL 0.3   Alkaline Phos 38 - 126 U/L 103   AST 15 - 41 U/L 20   ALT 0 - 44 U/L 16       Latest Ref Rng & Units 03/29/2024   10:23 AM  CBC  WBC 4.0 - 10.5 K/uL 7.9   Hemoglobin 12.0 - 15.0 g/dL 88.4   Hematocrit 63.9 - 46.0 % 34.6   Platelets 150 - 400 K/uL 260      Assessment and plan- Patient is a 73 y.o. female here for routine follow-up of history  of Rai stage 0 CLL and h/o right breast DCIS  Assessment and Plan    Surveillance for ductal carcinoma in situ of breast Completed five years of Arimidex  therapy in 2023. Recent mammogram in August was normal. - Scheduled mammogram for August  next year.  Chronic lymphocytic leukemia in remission CLL remains in remission. White blood cell count is normal at 7.9. Platelets are normal. Hemoglobin is slightly low at 11.5, but within acceptable range. - Continue annual follow-up for CLL with CBC ferritin and iron studies  Anemia, unspecified Hemoglobin is slightly low at 11.5, but within acceptable range. Previous hemoglobin levels have fluctuated between 11.5 and 12.5.           Visit Diagnosis 1. Encounter for follow-up surveillance of ductal carcinoma in situ (DCIS) of breast   2. CLL (chronic lymphocytic leukemia) (HCC)   3. Encounter for screening mammogram for malignant neoplasm of breast      Dr. Annah Skene, MD, MPH [facility] [phone] 04/01/2024 12:42 PM

## 2024-07-25 ENCOUNTER — Ambulatory Visit (INDEPENDENT_AMBULATORY_CARE_PROVIDER_SITE_OTHER): Admitting: Vascular Surgery

## 2025-03-31 ENCOUNTER — Inpatient Hospital Stay: Admitting: Oncology

## 2025-03-31 ENCOUNTER — Inpatient Hospital Stay
